# Patient Record
Sex: Female | Born: 1975 | Race: White | Hispanic: No | Marital: Married | Smoking: Current every day smoker
Health system: Southern US, Community
[De-identification: ages and names within clinical notes are randomized; demographics above are authoritative.]

## PROBLEM LIST (undated history)

## (undated) ENCOUNTER — Inpatient Hospital Stay (HOSPITAL_COMMUNITY): Payer: Self-pay

## (undated) DIAGNOSIS — N879 Dysplasia of cervix uteri, unspecified: Secondary | ICD-10-CM

## (undated) DIAGNOSIS — G4730 Sleep apnea, unspecified: Secondary | ICD-10-CM

## (undated) DIAGNOSIS — K219 Gastro-esophageal reflux disease without esophagitis: Secondary | ICD-10-CM

## (undated) DIAGNOSIS — E039 Hypothyroidism, unspecified: Secondary | ICD-10-CM

## (undated) DIAGNOSIS — K3580 Unspecified acute appendicitis: Secondary | ICD-10-CM

## (undated) DIAGNOSIS — M1990 Unspecified osteoarthritis, unspecified site: Secondary | ICD-10-CM

## (undated) DIAGNOSIS — E559 Vitamin D deficiency, unspecified: Secondary | ICD-10-CM

## (undated) DIAGNOSIS — G9050 Complex regional pain syndrome I, unspecified: Secondary | ICD-10-CM

## (undated) DIAGNOSIS — F419 Anxiety disorder, unspecified: Secondary | ICD-10-CM

## (undated) DIAGNOSIS — R600 Localized edema: Secondary | ICD-10-CM

## (undated) DIAGNOSIS — G90529 Complex regional pain syndrome I of unspecified lower limb: Secondary | ICD-10-CM

## (undated) DIAGNOSIS — R002 Palpitations: Secondary | ICD-10-CM

## (undated) DIAGNOSIS — J45909 Unspecified asthma, uncomplicated: Secondary | ICD-10-CM

## (undated) DIAGNOSIS — R519 Headache, unspecified: Secondary | ICD-10-CM

## (undated) DIAGNOSIS — M549 Dorsalgia, unspecified: Secondary | ICD-10-CM

## (undated) DIAGNOSIS — R1310 Dysphagia, unspecified: Secondary | ICD-10-CM

## (undated) DIAGNOSIS — M2550 Pain in unspecified joint: Secondary | ICD-10-CM

## (undated) DIAGNOSIS — R079 Chest pain, unspecified: Secondary | ICD-10-CM

## (undated) DIAGNOSIS — F329 Major depressive disorder, single episode, unspecified: Secondary | ICD-10-CM

## (undated) DIAGNOSIS — R51 Headache: Secondary | ICD-10-CM

## (undated) DIAGNOSIS — M797 Fibromyalgia: Secondary | ICD-10-CM

## (undated) DIAGNOSIS — F32A Depression, unspecified: Secondary | ICD-10-CM

## (undated) DIAGNOSIS — R0602 Shortness of breath: Secondary | ICD-10-CM

## (undated) DIAGNOSIS — K5900 Constipation, unspecified: Secondary | ICD-10-CM

## (undated) HISTORY — DX: Dysphagia, unspecified: R13.10

## (undated) HISTORY — DX: Palpitations: R00.2

## (undated) HISTORY — DX: Constipation, unspecified: K59.00

## (undated) HISTORY — DX: Pain in unspecified joint: M25.50

## (undated) HISTORY — DX: Hypothyroidism, unspecified: E03.9

## (undated) HISTORY — PX: OTHER SURGICAL HISTORY: SHX169

## (undated) HISTORY — DX: Chest pain, unspecified: R07.9

## (undated) HISTORY — PX: APPENDECTOMY: SHX54

## (undated) HISTORY — DX: Gastro-esophageal reflux disease without esophagitis: K21.9

## (undated) HISTORY — PX: KNEE SURGERY: SHX244

## (undated) HISTORY — DX: Localized edema: R60.0

## (undated) HISTORY — PX: TUBAL LIGATION: SHX77

## (undated) HISTORY — DX: Shortness of breath: R06.02

## (undated) HISTORY — PX: KNEE ARTHROSCOPY: SUR90

---
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
## 1995-01-21 HISTORY — PX: LAPAROSCOPIC GASTRIC BANDING: SHX1100

## 1997-09-04 ENCOUNTER — Emergency Department (HOSPITAL_COMMUNITY): Admission: EM | Admit: 1997-09-04 | Discharge: 1997-09-04 | Payer: Self-pay | Admitting: Emergency Medicine

## 1997-11-02 ENCOUNTER — Other Ambulatory Visit: Admission: RE | Admit: 1997-11-02 | Discharge: 1997-11-02 | Payer: Self-pay | Admitting: Obstetrics & Gynecology

## 2000-05-12 ENCOUNTER — Encounter: Payer: Self-pay | Admitting: Family Medicine

## 2000-05-12 ENCOUNTER — Encounter: Admission: RE | Admit: 2000-05-12 | Discharge: 2000-05-12 | Payer: Self-pay | Admitting: Family Medicine

## 2000-05-18 ENCOUNTER — Encounter: Payer: Self-pay | Admitting: Family Medicine

## 2000-05-18 ENCOUNTER — Ambulatory Visit (HOSPITAL_COMMUNITY): Admission: RE | Admit: 2000-05-18 | Discharge: 2000-05-18 | Payer: Self-pay | Admitting: Family Medicine

## 2000-10-23 ENCOUNTER — Encounter: Payer: Self-pay | Admitting: Family Medicine

## 2000-10-23 ENCOUNTER — Encounter: Admission: RE | Admit: 2000-10-23 | Discharge: 2000-10-23 | Payer: Self-pay | Admitting: Family Medicine

## 2001-06-15 ENCOUNTER — Other Ambulatory Visit: Admission: RE | Admit: 2001-06-15 | Discharge: 2001-06-15 | Payer: Self-pay | Admitting: Radiology

## 2002-02-03 ENCOUNTER — Other Ambulatory Visit: Admission: RE | Admit: 2002-02-03 | Discharge: 2002-02-03 | Payer: Self-pay | Admitting: *Deleted

## 2002-11-29 ENCOUNTER — Emergency Department (HOSPITAL_COMMUNITY): Admission: EM | Admit: 2002-11-29 | Discharge: 2002-11-29 | Payer: Self-pay | Admitting: Emergency Medicine

## 2003-04-18 ENCOUNTER — Encounter: Admission: RE | Admit: 2003-04-18 | Discharge: 2003-07-17 | Payer: Self-pay | Admitting: Sports Medicine

## 2003-11-02 ENCOUNTER — Ambulatory Visit (HOSPITAL_COMMUNITY): Admission: RE | Admit: 2003-11-02 | Discharge: 2003-11-02 | Payer: Self-pay | Admitting: Family Medicine

## 2004-07-01 ENCOUNTER — Ambulatory Visit: Payer: Self-pay | Admitting: Endocrinology

## 2004-08-20 ENCOUNTER — Ambulatory Visit: Payer: Self-pay | Admitting: Endocrinology

## 2004-10-11 ENCOUNTER — Ambulatory Visit: Payer: Self-pay | Admitting: Endocrinology

## 2005-01-10 ENCOUNTER — Emergency Department (HOSPITAL_COMMUNITY): Admission: EM | Admit: 2005-01-10 | Discharge: 2005-01-10 | Payer: Self-pay | Admitting: Emergency Medicine

## 2005-04-21 ENCOUNTER — Emergency Department (HOSPITAL_COMMUNITY): Admission: EM | Admit: 2005-04-21 | Discharge: 2005-04-22 | Payer: Self-pay | Admitting: Emergency Medicine

## 2005-09-18 ENCOUNTER — Encounter: Admission: RE | Admit: 2005-09-18 | Discharge: 2005-09-18 | Payer: Self-pay | Admitting: Gastroenterology

## 2005-09-24 ENCOUNTER — Encounter: Admission: RE | Admit: 2005-09-24 | Discharge: 2005-09-24 | Payer: Self-pay | Admitting: Gastroenterology

## 2005-10-22 ENCOUNTER — Ambulatory Visit (HOSPITAL_COMMUNITY): Admission: RE | Admit: 2005-10-22 | Discharge: 2005-10-22 | Payer: Self-pay | Admitting: Surgery

## 2005-10-22 ENCOUNTER — Encounter: Admission: RE | Admit: 2005-10-22 | Discharge: 2006-01-20 | Payer: Self-pay | Admitting: Sports Medicine

## 2005-11-05 ENCOUNTER — Ambulatory Visit (HOSPITAL_BASED_OUTPATIENT_CLINIC_OR_DEPARTMENT_OTHER): Admission: RE | Admit: 2005-11-05 | Discharge: 2005-11-05 | Payer: Self-pay | Admitting: Surgery

## 2005-11-09 ENCOUNTER — Ambulatory Visit: Payer: Self-pay | Admitting: Internal Medicine

## 2006-04-23 ENCOUNTER — Encounter: Admission: RE | Admit: 2006-04-23 | Discharge: 2006-07-22 | Payer: Self-pay | Admitting: Surgery

## 2006-05-18 ENCOUNTER — Ambulatory Visit (HOSPITAL_COMMUNITY): Admission: RE | Admit: 2006-05-18 | Discharge: 2006-05-19 | Payer: Self-pay | Admitting: Surgery

## 2006-09-08 ENCOUNTER — Encounter: Admission: RE | Admit: 2006-09-08 | Discharge: 2006-09-08 | Payer: Self-pay | Admitting: Surgery

## 2007-05-21 ENCOUNTER — Emergency Department (HOSPITAL_COMMUNITY): Admission: EM | Admit: 2007-05-21 | Discharge: 2007-05-21 | Payer: Self-pay | Admitting: Emergency Medicine

## 2008-03-06 ENCOUNTER — Other Ambulatory Visit: Admission: RE | Admit: 2008-03-06 | Discharge: 2008-03-06 | Payer: Self-pay | Admitting: Obstetrics & Gynecology

## 2008-12-09 ENCOUNTER — Emergency Department (HOSPITAL_COMMUNITY): Admission: EM | Admit: 2008-12-09 | Discharge: 2008-12-09 | Payer: Self-pay | Admitting: Emergency Medicine

## 2009-09-16 ENCOUNTER — Emergency Department (HOSPITAL_COMMUNITY): Admission: EM | Admit: 2009-09-16 | Discharge: 2009-09-17 | Payer: Self-pay | Admitting: Emergency Medicine

## 2009-09-17 ENCOUNTER — Inpatient Hospital Stay (HOSPITAL_COMMUNITY)
Admission: EM | Admit: 2009-09-17 | Discharge: 2009-09-23 | Payer: Self-pay | Source: Home / Self Care | Admitting: Psychiatry

## 2009-09-17 ENCOUNTER — Ambulatory Visit: Payer: Self-pay | Admitting: Psychiatry

## 2009-09-25 ENCOUNTER — Ambulatory Visit (HOSPITAL_COMMUNITY): Payer: Self-pay | Admitting: Psychiatry

## 2009-10-05 ENCOUNTER — Inpatient Hospital Stay (HOSPITAL_COMMUNITY): Admission: RE | Admit: 2009-10-05 | Discharge: 2009-10-09 | Payer: Self-pay | Admitting: Psychiatry

## 2009-10-16 ENCOUNTER — Ambulatory Visit (HOSPITAL_COMMUNITY): Payer: Self-pay | Admitting: Psychiatry

## 2009-10-17 ENCOUNTER — Encounter: Admission: RE | Admit: 2009-10-17 | Discharge: 2009-10-17 | Payer: Self-pay | Admitting: Sports Medicine

## 2009-10-24 ENCOUNTER — Ambulatory Visit (HOSPITAL_COMMUNITY): Payer: Self-pay | Admitting: Psychiatry

## 2009-10-26 ENCOUNTER — Ambulatory Visit (HOSPITAL_COMMUNITY): Payer: Self-pay | Admitting: Psychiatry

## 2009-11-02 ENCOUNTER — Ambulatory Visit (HOSPITAL_COMMUNITY): Payer: Self-pay | Admitting: Psychiatry

## 2009-11-09 ENCOUNTER — Ambulatory Visit (HOSPITAL_COMMUNITY): Payer: Self-pay | Admitting: Psychiatry

## 2009-11-20 ENCOUNTER — Ambulatory Visit (HOSPITAL_COMMUNITY): Payer: Self-pay | Admitting: Psychiatry

## 2009-11-23 ENCOUNTER — Ambulatory Visit (HOSPITAL_COMMUNITY): Payer: Self-pay | Admitting: Psychiatry

## 2009-12-07 ENCOUNTER — Ambulatory Visit (HOSPITAL_COMMUNITY): Payer: Self-pay | Admitting: Psychiatry

## 2009-12-18 ENCOUNTER — Ambulatory Visit (HOSPITAL_COMMUNITY): Payer: Self-pay | Admitting: Psychiatry

## 2010-01-01 ENCOUNTER — Ambulatory Visit (HOSPITAL_COMMUNITY): Payer: Self-pay | Admitting: Psychology

## 2010-01-07 ENCOUNTER — Emergency Department (HOSPITAL_COMMUNITY)
Admission: EM | Admit: 2010-01-07 | Discharge: 2010-01-08 | Payer: Self-pay | Source: Home / Self Care | Admitting: Emergency Medicine

## 2010-01-15 ENCOUNTER — Ambulatory Visit (HOSPITAL_COMMUNITY): Payer: Self-pay | Admitting: Psychiatry

## 2010-01-18 ENCOUNTER — Ambulatory Visit (HOSPITAL_COMMUNITY): Payer: Self-pay | Admitting: Psychiatry

## 2010-02-10 ENCOUNTER — Encounter: Payer: Self-pay | Admitting: Surgery

## 2010-02-10 ENCOUNTER — Encounter: Payer: Self-pay | Admitting: Gastroenterology

## 2010-02-14 ENCOUNTER — Ambulatory Visit (HOSPITAL_COMMUNITY): Admit: 2010-02-14 | Payer: Self-pay | Admitting: Psychiatry

## 2010-03-19 ENCOUNTER — Encounter (HOSPITAL_COMMUNITY): Payer: Self-pay | Admitting: Physician Assistant

## 2010-03-19 DIAGNOSIS — F309 Manic episode, unspecified: Secondary | ICD-10-CM

## 2010-03-26 ENCOUNTER — Encounter (HOSPITAL_COMMUNITY): Payer: Self-pay | Admitting: Psychology

## 2010-04-01 LAB — POCT I-STAT, CHEM 8
BUN: 9 mg/dL (ref 6–23)
Calcium, Ion: 1.13 mmol/L (ref 1.12–1.32)
Chloride: 106 mEq/L (ref 96–112)
Creatinine, Ser: 0.8 mg/dL (ref 0.4–1.2)
Glucose, Bld: 110 mg/dL — ABNORMAL HIGH (ref 70–99)
HCT: 44 % (ref 36.0–46.0)
Hemoglobin: 15 g/dL (ref 12.0–15.0)
Potassium: 3.7 mEq/L (ref 3.5–5.1)
Sodium: 137 mEq/L (ref 135–145)
TCO2: 22 mmol/L (ref 0–100)

## 2010-04-01 LAB — DIFFERENTIAL
Basophils Absolute: 0.1 10*3/uL (ref 0.0–0.1)
Basophils Relative: 1 % (ref 0–1)
Eosinophils Absolute: 0.1 10*3/uL (ref 0.0–0.7)
Eosinophils Relative: 1 % (ref 0–5)
Lymphocytes Relative: 28 % (ref 12–46)
Lymphs Abs: 2.6 10*3/uL (ref 0.7–4.0)
Monocytes Absolute: 0.9 10*3/uL (ref 0.1–1.0)
Monocytes Relative: 9 % (ref 3–12)
Neutro Abs: 5.9 10*3/uL (ref 1.7–7.7)
Neutrophils Relative %: 62 % (ref 43–77)

## 2010-04-01 LAB — CBC
HCT: 42.2 % (ref 36.0–46.0)
Hemoglobin: 14.4 g/dL (ref 12.0–15.0)
MCH: 30.8 pg (ref 26.0–34.0)
MCHC: 34.1 g/dL (ref 30.0–36.0)
MCV: 90.4 fL (ref 78.0–100.0)
Platelets: 216 10*3/uL (ref 150–400)
RBC: 4.67 MIL/uL (ref 3.87–5.11)
RDW: 13.3 % (ref 11.5–15.5)
WBC: 9.5 10*3/uL (ref 4.0–10.5)

## 2010-04-01 LAB — RAPID STREP SCREEN (MED CTR MEBANE ONLY): Streptococcus, Group A Screen (Direct): NEGATIVE

## 2010-04-01 LAB — MONONUCLEOSIS SCREEN: Mono Screen: NEGATIVE

## 2010-04-03 ENCOUNTER — Encounter (HOSPITAL_COMMUNITY): Payer: Self-pay | Admitting: Physician Assistant

## 2010-04-04 LAB — URINALYSIS, ROUTINE W REFLEX MICROSCOPIC
Bilirubin Urine: NEGATIVE
Glucose, UA: NEGATIVE mg/dL
Hgb urine dipstick: NEGATIVE
Ketones, ur: NEGATIVE mg/dL
Nitrite: NEGATIVE
Protein, ur: NEGATIVE mg/dL
Specific Gravity, Urine: 1.019 (ref 1.005–1.030)
Urobilinogen, UA: 0.2 mg/dL (ref 0.0–1.0)
pH: 5 (ref 5.0–8.0)

## 2010-04-04 LAB — DRUGS OF ABUSE SCREEN W/O ALC, ROUTINE URINE
Amphetamine Screen, Ur: NEGATIVE
Barbiturate Quant, Ur: NEGATIVE
Benzodiazepines.: NEGATIVE
Cocaine Metabolites: NEGATIVE
Creatinine,U: 134.5 mg/dL
Marijuana Metabolite: NEGATIVE
Methadone: NEGATIVE
Opiate Screen, Urine: NEGATIVE
Phencyclidine (PCP): NEGATIVE
Propoxyphene: NEGATIVE

## 2010-04-04 LAB — PREGNANCY, URINE: Preg Test, Ur: NEGATIVE

## 2010-04-04 LAB — TSH: TSH: 0.013 u[IU]/mL — ABNORMAL LOW (ref 0.350–4.500)

## 2010-04-05 LAB — DIFFERENTIAL
Eosinophils Relative: 2 % (ref 0–5)
Lymphocytes Relative: 34 % (ref 12–46)
Lymphs Abs: 2.2 10*3/uL (ref 0.7–4.0)
Monocytes Absolute: 0.3 10*3/uL (ref 0.1–1.0)
Monocytes Relative: 5 % (ref 3–12)

## 2010-04-05 LAB — RAPID URINE DRUG SCREEN, HOSP PERFORMED
Barbiturates: NOT DETECTED
Benzodiazepines: NOT DETECTED
Cocaine: NOT DETECTED

## 2010-04-05 LAB — CBC
HCT: 40.5 % (ref 36.0–46.0)
Hemoglobin: 14.1 g/dL (ref 12.0–15.0)
MCV: 91.5 fL (ref 78.0–100.0)
RDW: 12.5 % (ref 11.5–15.5)
WBC: 6.6 10*3/uL (ref 4.0–10.5)

## 2010-04-05 LAB — COMPREHENSIVE METABOLIC PANEL
AST: 17 U/L (ref 0–37)
BUN: 11 mg/dL (ref 6–23)
CO2: 27 mEq/L (ref 19–32)
Glucose, Bld: 111 mg/dL — ABNORMAL HIGH (ref 70–99)
Potassium: 4.1 mEq/L (ref 3.5–5.1)
Sodium: 141 mEq/L (ref 135–145)

## 2010-04-05 LAB — ETHANOL: Alcohol, Ethyl (B): <5 mg/dL (ref 0–10)

## 2010-04-17 ENCOUNTER — Encounter (HOSPITAL_COMMUNITY): Payer: Self-pay | Admitting: Physician Assistant

## 2010-04-24 LAB — COMPREHENSIVE METABOLIC PANEL
AST: 14 U/L (ref 0–37)
Alkaline Phosphatase: 66 U/L (ref 39–117)
CO2: 23 mEq/L (ref 19–32)
Chloride: 109 mEq/L (ref 96–112)
Creatinine, Ser: 0.78 mg/dL (ref 0.4–1.2)
GFR calc Af Amer: >60 mL/min (ref >60)
GFR calc non Af Amer: >60 mL/min (ref >60)
Potassium: 4.2 mEq/L (ref 3.5–5.1)
Total Bilirubin: 0.6 mg/dL (ref 0.3–1.2)

## 2010-04-24 LAB — CBC
HCT: 42.4 % (ref 36.0–46.0)
MCV: 95.3 fL (ref 78.0–100.0)
RBC: 4.45 MIL/uL (ref 3.87–5.11)
WBC: 8.9 10*3/uL (ref 4.0–10.5)

## 2010-04-24 LAB — DIFFERENTIAL
Basophils Absolute: 0 10*3/uL (ref 0.0–0.1)
Basophils Relative: 0 % (ref 0–1)
Eosinophils Absolute: 0.2 10*3/uL (ref 0.0–0.7)
Eosinophils Relative: 2 % (ref 0–5)
Lymphocytes Relative: 25 % (ref 12–46)

## 2010-04-24 LAB — URINALYSIS, ROUTINE W REFLEX MICROSCOPIC
Glucose, UA: NEGATIVE mg/dL
Hgb urine dipstick: NEGATIVE
Specific Gravity, Urine: 1.007 (ref 1.005–1.030)
Urobilinogen, UA: 0.2 mg/dL (ref 0.0–1.0)

## 2010-04-24 LAB — LIPASE, BLOOD: Lipase: 15 U/L (ref 11–59)

## 2010-06-07 NOTE — Op Note (Signed)
Sally [name], Sally [name]               ACCOUNT NO.:  [id number]   MEDICAL RECORD NO.:  [id number]          PATIENT TYPE:  AMB   LOCATION:  DAY                          FACILITY:  [facility]   PHYSICIAN:  Thornton Park. Daphine Deutscher, MD  DATE OF BIRTH:  [birth year]   DATE OF PROCEDURE:  05/18/2006  DATE OF DISCHARGE:                               OPERATIVE REPORT   PREOPERATIVE DIAGNOSES:  1. Morbid obesity BMI 46.5.  2. Questionable hiatal hernia.   POSTOPERATIVE DIAGNOSES:  Morbid obesity with no significant hiatal  hernia appreciated.   PROCEDURE:  Laparoscopic band (Allergan lap band APS) with antiprolapse  suture along the lesser curvature.   SURGEON:  Dr. Daphine Deutscher   ASSISTANT:  Dr. Ezzard Standing   ANESTHESIA:  General endotracheal.   DESCRIPTION OF PROCEDURE:  The patient was taken to room 1, given  general anesthesia.  The abdomen was prepped with Techni-Care and draped  sterilely.  The abdomen was entered through the left upper quadrant with  an OptiVu 0-degree without difficulty, and the abdomen was insufflated.  Usual other trocars were placed including the 12, eventually two 12s on  the right side, 11 to the left of the umbilicus for the scope.  Using an  angled scope, began the dissection up where the cardia was, and I  noticed that __________ dissect the fat pad down because it looked like  it might be herniating.  I freed all this and then went over on the  right side and fatty stripe on the right, created a window there and  then passed the band passer, but it always seemed like it wanted to not  come exactly through where we wanted it.  I then went back and dissected  more with the harmonic scalpel and opened up the window more from the  patient's left side and then the band passer which was at this time  passed from the right inferior port, came through easily.  The APS  system band was made ready and brought into the abdomen, the 12 in the  right upper side and once it was introduced,  it was threaded into the  band passer and brought around the stomach.  It was then snapped in  place.  Prior to doing this, I had passed the calibration tubing and had  actually blown it up with a balloon to 20 mL and pulled it back, and it  stopped at the GE junction and the hiatus.  I did not feel there was  significant hiatal hernia, at least on that test.  That sort of  corroborated the visual findings.  That being said, we went ahead and  put the band on and backed the tubing out at that point, and then  plicated it with 3 sutures using tie-knots and then a fourth suture  along the lesser curvature tied intracorporeally was an anti-prolapse  suture.  There was essentially no bleeding, and everything looked to be  in order.  The end of the tubing was then brought out through the lower  right port and a pocket was created in the Bovie,  and this was then  connected to the tubing and then sewn in place with 4  sutures of 2-0 Prolene.  Wounds were then irrigated, closed with 4-0  Vicryl, and with staples and with Dermabond.  The patient seemed to The  patient seemed to tolerate the procedure well and was taken to the  recovery room in satisfactory condition.      Thornton Park Daphine Deutscher, MD  Electronically Signed     MBM/MEDQ  D:  05/18/2006  T:  05/18/2006  Job:  [id number]   cc:   Tammy R. Collins [place], M.D.  Fax: [phone]   Graylin Shiver, M.D.  Fax: [phone]

## 2010-06-07 NOTE — Procedures (Signed)
Sally [name], Sally [name]               ACCOUNT NO.:  [id number]   MEDICAL RECORD NO.:  [id number]          PATIENT TYPE:  OUT   LOCATION:  SLEEP CENTER                 FACILITY:  [facility]   PHYSICIAN:  Clinton D. Maple Hudson, MD, FCCP, FACPDATE OF BIRTH:  August 18[birth year] 1974   DATE OF STUDY:  11/05/2005                              NOCTURNAL POLYSOMNOGRAM   REFERRING PHYSICIAN:  Thornton Park. Daphine Deutscher, M.D.   INDICATIONS FOR STUDY:  Hypersomnia with sleep apnea.  Epworth sleepiness  score 10/24.  BMI 46.4.  Weight 300 pounds.   HOME MEDICATIONS:  Synthroid, Lexapro, Wellbutrin XL, antihistamines,  heartburn medicines as needed, Xanax as needed.   SLEEP ARCHITECTURE:  Total sleep time 341 minutes with a sleep efficiency of  82%. Stage I was 8%, stage II 76%, stages III and IV 17%. REM absent. Sleep  latency 13 minutes. Awake after sleep onset 65 minutes. Arousal index 39.2.  She took Xanax at 10:00 p.m.   RESPIRATORY DATA:  Apnea/hypopnea index (AHI, RDI): 19.7 obstructive events  per hour indicating moderate obstructive sleep apnea/hypopnea syndrome.  There were 35 obstructive apneas, and 77 hypopneas. Events were not  positional.  CPAP titration was attempted beginning at 5 CWP.  The  technician tried repeatedly and made adjustments, but the patient could not  get comfortable to begin the CPAP titration process complaining of  suffocation and asking that it be discontinued at 1:16 a.m.  The study was  then continued as a diagnostic NPSG.   OXYGEN DATA:  Loud snoring with oxygen desaturation to a nadir of 60%. Mean  oxygen saturation through the study was 91% on room air.   CARDIAC DATA:  Sinus rhythm with occasional PACs and PVCs.   MOVEMENT/PARASOMNIA:  Occasional limb jerk with arousal.  Bathroom x2.   IMPRESSION/RECOMMENDATIONS:  1. Moderately severe obstructive sleep apnea/hypopnea syndrome, AHI 19.7      per hour with non-positional events, loud snoring and oxygen      desaturation to 60%.  2.  She was not able to tolerate CPAP on initial presentation on this study      night.  She indicates a willingness to try again.  Consider return for      CPAP titration when there is more opportunity to work with her on mask      and pressure adjustment for comfort.  It may help to bring a sleep      medication or to take Xanax again if she returns.      Otherwise, consider alternative interventions.  In the long term,      weight loss is strongly recommended for management of this condition.      Clinton D. Maple Hudson, MD, [facility], FACP  Diplomate, Biomedical engineer of Sleep Medicine  Electronically Signed     CDY/MEDQ  D:  11/09/2005 12:07:05  T:  11/10/2005 00:05:54  Job:  [id number]

## 2010-07-26 ENCOUNTER — Ambulatory Visit (INDEPENDENT_AMBULATORY_CARE_PROVIDER_SITE_OTHER): Payer: BC Managed Care – PPO | Admitting: Physician Assistant

## 2010-07-26 ENCOUNTER — Encounter (INDEPENDENT_AMBULATORY_CARE_PROVIDER_SITE_OTHER): Payer: Self-pay

## 2010-07-26 VITALS — BP 132/86 | Ht 68.0 in | Wt 238.4 lb

## 2010-07-26 DIAGNOSIS — Z4651 Encounter for fitting and adjustment of gastric lap band: Secondary | ICD-10-CM

## 2010-07-26 NOTE — Progress Notes (Signed)
  HISTORY: Sally [name] is a 34 y.o.female who received an AP-Standard lap-band in April 2008 by Dr. Daphine Deutscher. The patient has been doing well in the past year. She denies any persistent vomiting or regurgitation symptoms. She says her hunger has returned and her portion sizes although small are not as small as they once were. She believes an adjustment would be helpful today.  VITAL SIGNS: Filed Vitals:   07/26/10 1528  BP: 132/86    PHYSICAL EXAM: Physical exam reveals a very well-appearing 35 y.o.female in no apparent distress Neurologic: Awake, alert, oriented Psych: Bright affect, conversant Respiratory: Breathing even and unlabored. No stridor or wheezing Abdomen: Soft, nontender, nondistended to palpation. Incisions well-healed. No incisional hernias. Port easily palpated. Extremities: Atraumatic, good range of motion.  ASSESMENT: 35 y.o.  female  s/p AP-Standard lap-band. She could benefit from an adjustment today.  PLAN: The patient's port was accessed with a 20G Huber needle without difficulty. Clear fluid was aspirated and 0.25 mL saline was added to the port to give a total volume of 6.5 mL. The patient was able to swallow water without difficulty following the procedure and was instructed to take clear liquids for the next 24-48 hours and advance slowly as tolerated.

## 2010-07-26 NOTE — Patient Instructions (Signed)
Take clear liquids for the next 48 hours. Thin protein shakes are ok to start on Saturday evening. Call us if you have persistent vomiting or regurgitation, night cough or reflux symptoms. Return as scheduled or sooner if you notice no changes in hunger/portion sizes.   

## 2010-08-12 ENCOUNTER — Encounter (HOSPITAL_COMMUNITY): Payer: Self-pay

## 2010-08-12 ENCOUNTER — Encounter (HOSPITAL_COMMUNITY)
Admission: RE | Admit: 2010-08-12 | Discharge: 2010-08-12 | Disposition: A | Discharge status: Home / Self Care | Payer: BC Managed Care – PPO | Source: Ambulatory Visit | Attending: Obstetrics & Gynecology | Admitting: Obstetrics & Gynecology

## 2010-08-12 HISTORY — DX: Sleep apnea, unspecified: G47.30

## 2010-08-12 HISTORY — DX: Anxiety disorder, unspecified: F41.9

## 2010-08-12 HISTORY — DX: Fibromyalgia: M79.7

## 2010-08-12 LAB — CBC
HCT: 43.2 % (ref 36.0–46.0)
MCH: 31.5 pg (ref 26.0–34.0)
MCHC: 33.8 g/dL (ref 30.0–36.0)
MCV: 93.3 fL (ref 78.0–100.0)
Platelets: 236 10*3/uL (ref 150–400)
RDW: 12.9 % (ref 11.5–15.5)
WBC: 8.2 10*3/uL (ref 4.0–10.5)

## 2010-08-12 LAB — SURGICAL PCR SCREEN
MRSA, PCR: NEGATIVE
Staphylococcus aureus: NEGATIVE

## 2010-08-12 NOTE — Patient Instructions (Signed)
20 Marlisa Caridi  08/12/2010   Your procedure is scheduled on:  08/19/10  Report to [facility] at 0930 AM. Main entrance- desk phone- x [phone]  Call this number if you have problems the morning of surgery: [phone]   Remember:   Do not eat food:After Midnight.  Do not drink clear liquids: 4 Hours before arrival.7am   Take these medicines the morning of surgery with A SIP OF WATER: none   Do not wear jewelry, make-up or nail polish.  Do not bring valuables to the hospital.  Contacts, dentures or bridgework may not be worn into surgery.  Leave suitcase in the car. After surgery it may be brought to your room.  For patients admitted to the hospital, checkout time is 11:00 AM the day of discharge.   Patients discharged the day of surgery will not be allowed to drive home.  Name and phone number of your driver: Emeline Gins- [phone]  Special Instructions:  Please read over the following fact sheets that you were given:

## 2010-08-18 MED ORDER — CEFAZOLIN SODIUM-DEXTROSE 2-3 GM-% IV SOLR
2.0000 g | INTRAVENOUS | Status: AC
  Administered 2010-08-19: 2 g via INTRAVENOUS
  Filled 2010-08-18: qty 50

## 2010-08-19 ENCOUNTER — Encounter (HOSPITAL_COMMUNITY): Payer: Self-pay | Admitting: Obstetrics & Gynecology

## 2010-08-19 ENCOUNTER — Ambulatory Visit (HOSPITAL_COMMUNITY)
Admission: RE | Admit: 2010-08-19 | Discharge: 2010-08-19 | Disposition: A | Discharge status: Home / Self Care | Payer: BC Managed Care – PPO | Source: Ambulatory Visit | Attending: Obstetrics & Gynecology | Admitting: Obstetrics & Gynecology

## 2010-08-19 ENCOUNTER — Encounter (HOSPITAL_COMMUNITY): Payer: Self-pay | Admitting: Anesthesiology

## 2010-08-19 ENCOUNTER — Other Ambulatory Visit: Payer: Self-pay | Admitting: Obstetrics & Gynecology

## 2010-08-19 ENCOUNTER — Ambulatory Visit (HOSPITAL_COMMUNITY): Payer: BC Managed Care – PPO | Admitting: Anesthesiology

## 2010-08-19 ENCOUNTER — Encounter (HOSPITAL_COMMUNITY)
Admission: RE | Disposition: A | Discharge status: Home / Self Care | Payer: Self-pay | Source: Ambulatory Visit | Attending: Obstetrics & Gynecology

## 2010-08-19 ENCOUNTER — Encounter (HOSPITAL_COMMUNITY): Payer: Self-pay

## 2010-08-19 DIAGNOSIS — E039 Hypothyroidism, unspecified: Secondary | ICD-10-CM | POA: Insufficient documentation

## 2010-08-19 DIAGNOSIS — Z01812 Encounter for preprocedural laboratory examination: Secondary | ICD-10-CM | POA: Insufficient documentation

## 2010-08-19 DIAGNOSIS — Z01818 Encounter for other preprocedural examination: Secondary | ICD-10-CM | POA: Insufficient documentation

## 2010-08-19 DIAGNOSIS — N83201 Unspecified ovarian cyst, right side: Secondary | ICD-10-CM | POA: Diagnosis present

## 2010-08-19 DIAGNOSIS — IMO0001 Reserved for inherently not codable concepts without codable children: Secondary | ICD-10-CM | POA: Insufficient documentation

## 2010-08-19 DIAGNOSIS — N83209 Unspecified ovarian cyst, unspecified side: Secondary | ICD-10-CM | POA: Insufficient documentation

## 2010-08-19 DIAGNOSIS — N949 Unspecified condition associated with female genital organs and menstrual cycle: Secondary | ICD-10-CM | POA: Insufficient documentation

## 2010-08-19 DIAGNOSIS — R102 Pelvic and perineal pain: Secondary | ICD-10-CM | POA: Diagnosis present

## 2010-08-19 HISTORY — PX: LAPAROSCOPY: SHX197

## 2010-08-19 LAB — PREGNANCY, URINE: Preg Test, Ur: NEGATIVE

## 2010-08-19 SURGERY — LAPAROSCOPY OPERATIVE
Anesthesia: General | Laterality: Right | Wound class: Clean

## 2010-08-19 MED ORDER — HYDROMORPHONE HCL 1 MG/ML IJ SOLN
0.2500 mg | INTRAMUSCULAR | Status: DC | PRN
  Administered 2010-08-19: 0.25 mg via INTRAVENOUS
  Administered 2010-08-19 (×2): 0.5 mg via INTRAVENOUS
  Administered 2010-08-19: 0.25 mg via INTRAVENOUS

## 2010-08-19 MED ORDER — PROPOFOL 10 MG/ML IV EMUL
INTRAVENOUS | Status: DC | PRN
  Administered 2010-08-19: 200 mg via INTRAVENOUS

## 2010-08-19 MED ORDER — NEOSTIGMINE METHYLSULFATE 1 MG/ML IJ SOLN
INTRAMUSCULAR | Status: AC
  Filled 2010-08-19: qty 10

## 2010-08-19 MED ORDER — FAMOTIDINE 20 MG PO TABS: 20.0000 mg | ORAL_TABLET | Freq: Once | ORAL | Status: DC | PRN

## 2010-08-19 MED ORDER — GLYCOPYRROLATE 0.2 MG/ML IJ SOLN
INTRAMUSCULAR | Status: AC
  Filled 2010-08-19: qty 1

## 2010-08-19 MED ORDER — LIDOCAINE HCL (CARDIAC) 20 MG/ML IV SOLN
INTRAVENOUS | Status: DC | PRN
  Administered 2010-08-19: 50 mg via INTRAVENOUS

## 2010-08-19 MED ORDER — FENTANYL CITRATE 0.05 MG/ML IJ SOLN
INTRAMUSCULAR | Status: DC | PRN
  Administered 2010-08-19: 150 ug via INTRAVENOUS
  Administered 2010-08-19: 50 ug via INTRAVENOUS

## 2010-08-19 MED ORDER — ACETAMINOPHEN 10 MG/ML IV SOLN: 1000.0000 mg | Freq: Once | INTRAVENOUS | Status: DC | PRN

## 2010-08-19 MED ORDER — FENTANYL CITRATE 0.05 MG/ML IJ SOLN
INTRAMUSCULAR | Status: AC
  Filled 2010-08-19: qty 5

## 2010-08-19 MED ORDER — PANTOPRAZOLE SODIUM 40 MG PO TBEC: 40.0000 mg | DELAYED_RELEASE_TABLET | Freq: Once | ORAL | Status: DC | PRN

## 2010-08-19 MED ORDER — DEXAMETHASONE SODIUM PHOSPHATE 10 MG/ML IJ SOLN
INTRAMUSCULAR | Status: AC
  Filled 2010-08-19: qty 1

## 2010-08-19 MED ORDER — DEXAMETHASONE SODIUM PHOSPHATE 4 MG/ML IJ SOLN
INTRAMUSCULAR | Status: DC | PRN
  Administered 2010-08-19: 10 mg via INTRAVENOUS

## 2010-08-19 MED ORDER — ROCURONIUM BROMIDE 50 MG/5ML IV SOLN
INTRAVENOUS | Status: AC
  Filled 2010-08-19: qty 1

## 2010-08-19 MED ORDER — LIDOCAINE HCL (CARDIAC) 20 MG/ML IV SOLN
INTRAVENOUS | Status: AC
  Filled 2010-08-19: qty 5

## 2010-08-19 MED ORDER — MIDAZOLAM HCL 5 MG/5ML IJ SOLN
INTRAMUSCULAR | Status: DC | PRN
  Administered 2010-08-19: 2 mg via INTRAVENOUS

## 2010-08-19 MED ORDER — PROPOFOL 10 MG/ML IV EMUL
INTRAVENOUS | Status: AC
  Filled 2010-08-19: qty 20

## 2010-08-19 MED ORDER — ATROPINE SULFATE 0.4 MG/ML IJ SOLN
INTRAMUSCULAR | Status: AC
  Filled 2010-08-19: qty 1

## 2010-08-19 MED ORDER — BUPIVACAINE HCL (PF) 0.25 % IJ SOLN
INTRAMUSCULAR | Status: DC | PRN
  Administered 2010-08-19: 14 mL
  Administered 2010-08-19: 8 mL

## 2010-08-19 MED ORDER — MIDAZOLAM HCL 2 MG/2ML IJ SOLN
INTRAMUSCULAR | Status: AC
  Filled 2010-08-19: qty 2

## 2010-08-19 MED ORDER — LACTATED RINGERS IR SOLN
Status: DC | PRN
  Administered 2010-08-19: 3000 mL

## 2010-08-19 MED ORDER — KETOROLAC TROMETHAMINE 60 MG/2ML IM SOLN
INTRAMUSCULAR | Status: DC | PRN
  Administered 2010-08-19 (×2): 30 mg via INTRAMUSCULAR

## 2010-08-19 MED ORDER — FENTANYL CITRATE 0.05 MG/ML IJ SOLN
INTRAMUSCULAR | Status: DC | PRN
  Administered 2010-08-19 (×3): 100 ug via INTRAVENOUS
  Administered 2010-08-19: 50 ug via INTRAVENOUS

## 2010-08-19 MED ORDER — KETOROLAC TROMETHAMINE 60 MG/2ML IM SOLN
INTRAMUSCULAR | Status: AC
  Filled 2010-08-19: qty 2

## 2010-08-19 MED ORDER — CITRIC ACID-SODIUM CITRATE 334-500 MG/5ML PO SOLN: 30.0000 mL | Freq: Once | ORAL | Status: DC | PRN

## 2010-08-19 MED ORDER — HYDROMORPHONE HCL 1 MG/ML IJ SOLN
INTRAMUSCULAR | Status: AC
  Filled 2010-08-19: qty 1

## 2010-08-19 MED ORDER — FENTANYL CITRATE 0.05 MG/ML IJ SOLN
INTRAMUSCULAR | Status: AC
  Filled 2010-08-19: qty 2

## 2010-08-19 MED ORDER — ATROPINE SULFATE 0.4 MG/ML IJ SOLN
INTRAMUSCULAR | Status: DC | PRN
  Administered 2010-08-19: 0.4 mg via INTRAVENOUS
  Administered 2010-08-19: .2 mg via INTRAVENOUS
  Administered 2010-08-19: .3 mg via INTRAVENOUS

## 2010-08-19 MED ORDER — HYDROCODONE-ACETAMINOPHEN 5-500 MG PO TABS: 1.0000 | ORAL_TABLET | Freq: Four times a day (QID) | ORAL | Status: DC | PRN

## 2010-08-19 MED ORDER — MIDAZOLAM HCL 2 MG/2ML IJ SOLN: 0.5000 mg | INTRAMUSCULAR | Status: DC | PRN

## 2010-08-19 MED ORDER — ONDANSETRON HCL 4 MG/2ML IJ SOLN
INTRAMUSCULAR | Status: AC
  Filled 2010-08-19: qty 2

## 2010-08-19 MED ORDER — ROCURONIUM BROMIDE 100 MG/10ML IV SOLN
INTRAVENOUS | Status: DC | PRN
  Administered 2010-08-19: 40 mg via INTRAVENOUS

## 2010-08-19 MED ORDER — MEPERIDINE HCL 25 MG/ML IJ SOLN: 6.2500 mg | INTRAMUSCULAR | Status: DC | PRN

## 2010-08-19 MED ORDER — LACTATED RINGERS IV SOLN
INTRAVENOUS | Status: DC
  Administered 2010-08-19 (×2): via INTRAVENOUS

## 2010-08-19 MED ORDER — ACETAMINOPHEN 325 MG PO TABS
325.0000 mg | ORAL_TABLET | ORAL | Status: DC | PRN
Start: 2010-08-19 — End: 2010-08-19

## 2010-08-19 MED ORDER — GLYCOPYRROLATE 0.2 MG/ML IJ SOLN
INTRAMUSCULAR | Status: DC | PRN
  Administered 2010-08-19: 3 mg via INTRAVENOUS

## 2010-08-19 MED ORDER — PROMETHAZINE HCL 25 MG/ML IJ SOLN: 6.2500 mg | INTRAMUSCULAR | Status: DC | PRN

## 2010-08-19 SURGICAL SUPPLY — 35 items
APL SKNCLS STERI-STRIP NONHPOA (GAUZE/BANDAGES/DRESSINGS)
BAG SPEC RTRVL LRG 6X4 10 (ENDOMECHANICALS)
BARRIER ADHS 3X4 INTERCEED (GAUZE/BANDAGES/DRESSINGS) ×1 IMPLANT
BENZOIN TINCTURE PRP APPL 2/3 (GAUZE/BANDAGES/DRESSINGS) IMPLANT
BRR ADH 4X3 ABS CNTRL BYND (GAUZE/BANDAGES/DRESSINGS) ×2
CABLE HIGH FREQUENCY MONO STRZ (ELECTRODE) ×1 IMPLANT
CATH ROBINSON RED A/P 16FR (CATHETERS) ×1 IMPLANT
CHLORAPREP W/TINT 26ML (MISCELLANEOUS) ×3 IMPLANT
CLOTH BEACON ORANGE TIMEOUT ST (SAFETY) ×3 IMPLANT
DERMABOND ADVANCED (GAUZE/BANDAGES/DRESSINGS) ×1 IMPLANT
GLOVE BIOGEL PI IND STRL 7.0 (GLOVE) ×6 IMPLANT
GLOVE BIOGEL PI INDICATOR 7.0 (GLOVE) ×3
GLOVE ECLIPSE 6.0 STRL STRAW (GLOVE) ×1 IMPLANT
GLOVE ECLIPSE 6.5 STRL STRAW (GLOVE) ×6 IMPLANT
GLOVE INDICATOR 6.5 STRL GRN (GLOVE) ×1 IMPLANT
GOWN PREVENTION PLUS LG XLONG (DISPOSABLE) ×6 IMPLANT
NDL INSUFFLATION 14GA 120MM (NEEDLE) ×2 IMPLANT
NEEDLE INSUFFLATION 14GA 120MM (NEEDLE) ×3 IMPLANT
PACK LAPAROSCOPY BASIN (CUSTOM PROCEDURE TRAY) ×3 IMPLANT
POUCH SPECIMEN RETRIEVAL 10MM (ENDOMECHANICALS) IMPLANT
SCISSORS LAP 5X35 DISP (ENDOMECHANICALS) ×1 IMPLANT
SEALER TISSUE G2 CVD JAW 35 (ENDOMECHANICALS) IMPLANT
SEALER TISSUE G2 CVD JAW 45CM (ENDOMECHANICALS)
SET IRRIG TUBING LAPAROSCOPIC (IRRIGATION / IRRIGATOR) ×1 IMPLANT
STRIP CLOSURE SKIN 1/2X4 (GAUZE/BANDAGES/DRESSINGS) IMPLANT
STRIP CLOSURE SKIN 1/4X4 (GAUZE/BANDAGES/DRESSINGS) ×3 IMPLANT
SUT VICRYL 0 UR6 27IN ABS (SUTURE) IMPLANT
SUT VICRYL 4-0 PS2 18IN ABS (SUTURE) ×3 IMPLANT
SYR 30ML LL (SYRINGE) IMPLANT
TOWEL OR 17X24 6PK STRL BLUE (TOWEL DISPOSABLE) ×6 IMPLANT
TRAY FOLEY CATH 14FR (SET/KITS/TRAYS/PACK) ×3 IMPLANT
TROCAR BALLN 12MMX100 BLUNT (TROCAR) IMPLANT
TROCAR XCEL NON-BLD 11X100MML (ENDOMECHANICALS) ×1 IMPLANT
TROCAR XCEL NON-BLD 5MMX100MML (ENDOMECHANICALS) ×2 IMPLANT
WATER STERILE IRR 1000ML POUR (IV SOLUTION) ×3 IMPLANT

## 2010-08-19 NOTE — H&P (Signed)
Sally [name] is an 35 y.o. female with persistent 3cm adnexal/ovarian cyst and chronic pelvic pain.  We have followed this cyst on ultrasound for over a year and now, due to patient anxiety, have decided to proceed with removal.  Risks and benefits have been discussed with patient.  This is all documented in her office chart.  While treatment for the ovarian cyst, I will also evaluate for endometriosis and, if present, will plan to treat at the same time.  Pertinent Gynecological History: Menses: flow is light and irregular due to IUD Bleeding: minimal Contraception: IUD DES exposure: denies Blood transfusions: none Sexually transmitted diseases: no past history Previous GYN Procedures: none except ultrasounds  Last mammogram: not appropriate yet due to age Date: N/A Last pap: normal Date: May 2012 OB History: G1, P1   Menstrual History: Menarche age: 12 No LMP recorded. Patient is not currently having periods (Reason: IUD).    Past Medical History  Diagnosis Date  . Hypothyroidism   . Sleep apnea     pre lap banding  . Anxiety   . Fibromyalgia     Past Surgical History  Procedure Date  . Cesarean section   . Laparoscopic gastric banding 1997  . Knee surgery   . Brain surgery 07/1989    blood clot removed    No family history on file.  Social History:  reports that she has been smoking.  She does not have any smokeless tobacco history on file. She reports that she drinks alcohol. She reports that she does not use illicit drugs.  Allergies:  Allergies  Allergen Reactions  . Paxil Rash  . Percocet (Oxycodone-Acetaminophen) Itching  . Codeine Other (See Comments)    Puts patient to sleep "knocks her out" for at least two days, regardless of dosage.    Prescriptions prior to admission  Medication Sig Dispense Refill  . acetaminophen (TYLENOL) 500 MG tablet Take 500 mg by mouth every 6 (six) hours as needed. For pain       . acetaminophen (TYLENOL) 650 MG CR  tablet Take 650 mg by mouth every 8 (eight) hours as needed. For joint pain       . clonazePAM (KLONOPIN) 1 MG tablet Take 0.5 mg by mouth 2 (two) times daily as needed. For anxiety       . FLUoxetine (PROZAC) 40 MG capsule Take 40 mg by mouth daily.        Marland Kitchen levothyroxine (SYNTHROID, LEVOTHROID) 150 MCG tablet Take 150 mcg by mouth daily.        . Loratadine (CLARITIN) 10 MG CAPS Take 10 mg by mouth. Patient takes generic version.         Review of Systems  Constitutional: Negative for fever and chills.  Eyes: Negative for blurred vision.  Respiratory: Negative for cough and shortness of breath.   Cardiovascular: Negative for chest pain and palpitations.  Gastrointestinal: Negative for heartburn and abdominal pain.  Genitourinary: Negative for dysuria and urgency.  Neurological: Negative for dizziness, loss of consciousness and headaches.  Endo/Heme/Allergies: Does not bruise/bleed easily.  Psychiatric/Behavioral: Negative for depression.    Blood pressure 154/89, pulse 62, temperature 98.1 F (36.7 C), temperature source Oral, resp. rate 20, height 5' 7.5" (1.715 m), weight 107.956 kg (238 lb), SpO2 100.00%. Physical Exam  Constitutional: She is oriented to person, place, and time. She appears well-developed and well-nourished.  Neck: Normal range of motion. Neck supple. No thyromegaly present.  Cardiovascular: Normal rate and regular rhythm.  Respiratory: Effort normal and breath sounds normal.  GI: Soft. Bowel sounds are normal. She exhibits no distension and no mass. There is no tenderness.  Genitourinary: Vagina normal and uterus normal.       IUD string present  Musculoskeletal: Normal range of motion.  Neurological: She is alert and oriented to person, place, and time.  Skin: Skin is warm and dry.  Psychiatric: She has a normal mood and affect.    Results for orders placed during the hospital encounter of 08/19/10 (from the past 24 hour(s))  PREGNANCY, URINE     Status:  Normal   Collection Time   08/19/10  9:47 AM      Component Value Range   Preg Test, Ur NEGATIVE      No results found.  Assessment/Plan: 35 year old with chronic pelvic pain and persistent 3cm ovarian cyst for >1year.  Planning laparoscopic removal, partly due to patient anxiety, and at same time will evaluate for endometriosis.  Dmarco Baldus,M SUZANNE 08/19/2010, 10:41 AM

## 2010-08-19 NOTE — Progress Notes (Signed)
Noted on arrival to short stay pt has two  marks on left arm. Upper area by inner bicep it looks like a triangle red with small scab and lower left arm has a line about 3 " long and 1/4 " wide. Pt stated she burned herself recently ironing. Pt. also has a purple bruise on her rt knee 2" diameter circle.

## 2010-08-19 NOTE — Anesthesia Preprocedure Evaluation (Addendum)
Anesthesia Evaluation  Name, MR# and DOB Patient awake  General Assessment Comment  Reviewed: Allergy & Precautions, H&P , Patient's Chart, lab work & pertinent test results and reviewed documented beta blocker date and time   History of Anesthesia Complications Negative for: history of anesthetic complications  Airway Mallampati: IV and II TM Distance: >3 FB Neck ROM: full    Dental No notable dental hx    Pulmonaryneg pulmonary ROS  sleep apnea  clear to auscultation  pulmonary exam normal   Cardiovascular Exercise Tolerance: Good regular Normal   Neuro/Psych Neuromuscular diseaseNegative Neurological ROS Negative Psych ROS  GI/Hepatic/Renal negative GI ROS, negative Liver ROS, and negative Renal ROS (+)       Endo/Other  Negative Endocrine ROS (+)  Hypothyroidism, Morbid obesity Abdominal   Musculoskeletal  (+) Fibromyalgia - Hematology negative hematology ROS (+)   Peds  Reproductive/Obstetrics negative OB ROS   Anesthesia Other Findings             Anesthesia Physical Anesthesia Plan  ASA: III  Anesthesia Plan: General   Post-op Pain Management:    Induction:   Airway Management Planned:   Additional Equipment:   Intra-op Plan:   Post-operative Plan:   Informed Consent: I have reviewed the patients History and Physical, chart, labs and discussed the procedure including the risks, benefits and alternatives for the proposed anesthesia with the patient or authorized representative who has indicated his/her understanding and acceptance.   Dental Advisory Given  Plan Discussed with: CRNA and Surgeon  Anesthesia Plan Comments:         Anesthesia Quick Evaluation

## 2010-08-19 NOTE — Transfer of Care (Signed)
Immediate Anesthesia Transfer of Care Note  Patient: Sally [name]  Procedure(s) Performed:  LAPAROSCOPY OPERATIVE - with Biopsy of uterine serosa; CYST REMOVAL - ovarian  Patient Location: PACU  Anesthesia Type: General  Level of Consciousness: awake, alert , oriented, patient cooperative and responds to stimulation  Airway & Oxygen Therapy: Patient Spontanous Breathing and Patient connected to nasal cannula oxygen  Post-op Assessment: Report given to PACU RN, Post -op Vital signs reviewed and stable and Patient moving all extremities  Post vital signs: Reviewed and stable  Complications: No apparent anesthesia complications

## 2010-08-19 NOTE — Op Note (Signed)
Operative Note   Date of surgery 08/19/2010   Operation:  Operative Laparoscopy with right ovarian cystectomy, biopsy of uterine serosal tissue  Pre-operative diagosis-: right persistent ovarian cyst, chronic pelvic pain, fibromyalgia, hypothyroidism  Post-operative diagnosis-: same as above  Surgeon Lum Keas  Assistant: Meredeth Ide, MD  Anesthesia: General endotracheal anesthesia, Dr. Brayton Caves oversaw the case  Blood loss: 25cc  Urine output: 150cc, concentrated  Fluids:  1000cc LR  Findings: adhesions in the left upper quadrant consistent with history of lap band, regressing left ovarian cyst, right simple appearing ovarian cyst, cystic lesions on uterine serosa with filmy adhesions present--question possible endometriosis, otherwise negative pelvis  Specimen- right ovarian cyst, biopsy of uterine serosal lesions sent to Pathology  Complications- None  Drains: None  Indications:  [name] is a 35 year old G1 P1 single white female with a history of chronic pelvic pain and a persistent right ovarian cyst. This cyst has been followed with ultrasounds over the last year and a half. Because the pain has not improved and because she has some anxiety regarding this cyst we have discussed surgical removal. At the same time she and I have discussed evaluating for the presence or absence of endometriosis in managing this appropriately. Patient has been counseled about risks and benefits and all this is documented in her office chart. She presents today for the procedure and is ready to proceed.  Operative Note:  [name] was admitted to same-day surgery and taken to the operating room. She was placed in the supine position. A running IV was present in her right arm. Armboard extensions were placed on either side of the operative table. General endotracheal anesthesia was administered by the anesthesia staff  without difficulty. Patient's legs were then placed in the low lithotomy position in West stirrups. SCD's were present and functioning throughout the procedure.  The abdomen was prepped with chlor prep. The inner thighs, vagina, and perineum were prepped with Betadine prep. The legs were lifted to the high lithotomy position. A bivalve speculum was placed in the vagina. The anterior lip of the cervix was grasped with a single-tooth tenaculum. An acorn uterine manipulator was advanced into the cervical os and attached to the tenaculum as a means of manipulating the uterus during the procedure. The bivalve speculum was removed. A Foley catheter was placed to straight drain. The patient was draped in the normal sterile fashion. Legs were placed now in the low lithotomy position. Attention was turned to the abdomen. 0.25% Marcaine was used the anesthetize the skin beneath the umbilicus. Using a #11 blade a 10 mm skin incision was made.  The subcutaneous tissue with dissected with a hemostat. The fascia was identified. A Veress needle was obtained. The abdomen was elevated and the needle was passed through the abdominal wall layers. The peritoneum was popped through.  A syringe of normal saline was obtained.  The syringe was attached to the open Veress needle and aspiration was performed in there was no blood or fluid noted. An injection of saline was performed and then an aspiration was performed again.  No blood, fluid, or saline was obtained. Then under low flow of CO2 gas, pneumoperitoneum was obtained.  3-1/2 L of CO2 gas was instilled in the abdomen before the Veress needle was removed. The laparoscope was attached to a #10 disposable Optiview trocar and port. The abdomen was elevated and with a twisting motion this device was passed through the abdominal wall layers. Once intra-abdominal placement was noted the  trocar was removed and the laparoscope was used to visualize throughout the pelvis and upper abdomen.  The above findings were noted.  The right and left lower quadrant abdominal walls were transilluminated to identify locations for the right and left lower quadrant ports to be used during the procedure. The skin was anesthetized with .025% Marcaine and #11 blades were used to nick the skin.  5 mm skin incisions were placed in the right and left lower quadrants. 5 mm non-bladed trochars and ports were placed in the right and left lower quadrants under direct visualization of the laparoscope. The trochars were removed and a blunt probe was obtained. The blunt probe was then used to lift the ovaries. There is no evidence of endometriosis behind the ovaries or in the cul-de-sac. The anterior cul-de-sac had some scarring the along the lower uterine segment, consistent with scar from prior C-section.  There were vesicular appearing lesions on the uterine serosa on the left side near the left round ligament. Otherwise the pelvis and peritoneum appeared normal. The ureters were noted bilaterally. The upper abdomen was surveyed. There were some adhesions of the omentum near the gallbladder on the right side.  The liver edge appeared normal. She is status post lap band placement. The band and appropriate adhesions were noted in the left upper quadrant.  At this point the surgical procedure was initiated. The uterus was elevated and placed on stretch of the patient's left. A Cobra grasper was obtained and used to grab the right utero-ovarian ligament. In the left port, a pair of disposable endoscopic scissors was passed. Monopolar cautery was attached to this. An area was cauterized across the cyst and then incised with the monopolar scissors.  This incision was initially just through the ovarian tissue.  The cyst wall and fluid inside was still intact.  Using the monopolar scissors and an opening motion, the cyst was was teased free from the ovarian tissue.  Once at least a third of the cyst was freed, it was grasped with  the Cobra grasped.  Using traction and counter traction, the cyst was freed completed from the right ovary.  The cyst was still intact and brought out through the left port and handed off to be sent to pathology. Using a Nezhat suction irrigator the ovary irrigated.  There were some small areas of bleeding in the bed of the ovarian cyst which were made hemostatic with monopolar cautery. The Nezhat suction irrigator was then used to irrigate the ovary again and no bleeding was noted. The pelvis was irrigated and the irrigant was removed out of the pelvis. A piece of Interceed was wrapped around the right ovary to prevent any lesions in the future.   Then the lesion is on the serosa of the uterus was grasped with a Cobra grasper and using the monopolar scissors this tissue was excised. The tissue was handed off as a separate pathology specimen. The area of the serosa of the uterus was bleeding ever so slightly and this was cauterized with monopolar cautery. The suction irrigator then was used.  Hemostasis was present where the tissue biopsy was performed and throughout the pelvis. At this point the procedure was ended the instruments were removed through the right and left lower quadrant ports. The ports were removed under direct visualization of the laparoscope.  Laparoscope was removed. The patient was taken out of Trendelenburg positioning. Before the midline port was removed the CRNA gave the patient several deep breaths.  At the same  time, the operator and assistant were compressing the abdomen to try and get any gas to excape from the midline port. Once this was done 3 times a midline port was removed. Then the midline incision was closed at the fascial level with a figure-of-eight suture of #0 Vicryl. The skin was closed with subcuticular stitch of 3-0 Vicryl. The incisions were cleansed Dermabond was placed on the skin.  Attention was turned to the vagina the tenaculum and acorn uterine manipulator were  removed. There was no bleeding noted from the cervix. The catheter was removed. Betadine prep was washed from the skin.  Sponge, lap, needle, and instrument counts were correct x2.  Legs were positioned in supine position. She was awake from anesthesia, extubated, and taken to the recovery room in stable condition. At this point to two pathology specimens are pending.    Lum Keas MD, 12:55 PM 08/19/2010

## 2010-08-19 NOTE — Anesthesia Postprocedure Evaluation (Signed)
  Anesthesia Post-op Note  Patient: Sally [name]  Procedure(s) Performed:  LAPAROSCOPY OPERATIVE - with Biopsy of uterine serosa; CYST REMOVAL - ovarian Patient's cardiopulmonary status is stable Patient's level of consciousness: sedate but responsive verbally Pain and nausea are all reasonably controlled No anesthetic complications apparent at this time No follow up care necessary at this time

## 2010-10-15 ENCOUNTER — Encounter (HOSPITAL_COMMUNITY): Payer: Self-pay | Admitting: Obstetrics & Gynecology

## 2010-11-11 ENCOUNTER — Emergency Department (HOSPITAL_COMMUNITY)
Admission: EM | Admit: 2010-11-11 | Discharge: 2010-11-11 | Disposition: A | Discharge status: Home / Self Care | Payer: BC Managed Care – PPO | Attending: Emergency Medicine | Admitting: Emergency Medicine

## 2010-11-11 DIAGNOSIS — R3915 Urgency of urination: Secondary | ICD-10-CM | POA: Insufficient documentation

## 2010-11-11 DIAGNOSIS — R350 Frequency of micturition: Secondary | ICD-10-CM | POA: Insufficient documentation

## 2010-11-11 DIAGNOSIS — R319 Hematuria, unspecified: Secondary | ICD-10-CM | POA: Insufficient documentation

## 2010-11-11 DIAGNOSIS — R300 Dysuria: Secondary | ICD-10-CM | POA: Insufficient documentation

## 2010-11-11 DIAGNOSIS — N390 Urinary tract infection, site not specified: Secondary | ICD-10-CM | POA: Insufficient documentation

## 2010-11-11 DIAGNOSIS — E039 Hypothyroidism, unspecified: Secondary | ICD-10-CM | POA: Insufficient documentation

## 2010-11-11 LAB — URINALYSIS, ROUTINE W REFLEX MICROSCOPIC
Nitrite: NEGATIVE
Specific Gravity, Urine: >1.03 — ABNORMAL HIGH (ref 1.005–1.030)
Urobilinogen, UA: 0.2 mg/dL (ref 0.0–1.0)
pH: 6 (ref 5.0–8.0)

## 2010-11-11 LAB — URINE MICROSCOPIC-ADD ON

## 2010-11-11 LAB — POCT PREGNANCY, URINE: Preg Test, Ur: NEGATIVE

## 2010-11-13 LAB — URINE CULTURE
Colony Count: >=100000
Culture  Setup Time: 201210221439

## 2011-01-06 ENCOUNTER — Encounter (INDEPENDENT_AMBULATORY_CARE_PROVIDER_SITE_OTHER): Payer: Self-pay | Admitting: Surgery

## 2011-01-29 ENCOUNTER — Telehealth (INDEPENDENT_AMBULATORY_CARE_PROVIDER_SITE_OTHER): Payer: Self-pay | Admitting: General Surgery

## 2011-01-29 NOTE — Telephone Encounter (Signed)
Patient is [redacted] weeks pregnant and suffering with n/v from morning sickness. She would like to know if she should have her fluid removed from her lap band?

## 2011-01-31 ENCOUNTER — Encounter (INDEPENDENT_AMBULATORY_CARE_PROVIDER_SITE_OTHER): Payer: Self-pay

## 2011-01-31 ENCOUNTER — Ambulatory Visit (INDEPENDENT_AMBULATORY_CARE_PROVIDER_SITE_OTHER): Payer: BC Managed Care – PPO | Admitting: Physician Assistant

## 2011-01-31 VITALS — BP 126/80 | HR 68 | Temp 97.6°F | Resp 18 | Ht 68.0 in | Wt 237.2 lb

## 2011-01-31 DIAGNOSIS — Z4651 Encounter for fitting and adjustment of gastric lap band: Secondary | ICD-10-CM

## 2011-01-31 NOTE — Patient Instructions (Signed)
Return if you have difficulties or after you deliver.

## 2011-01-31 NOTE — Progress Notes (Signed)
  HISTORY: Sally [name] is a 36 y.o.female who received an AP-Standard lap-band in April 2008 by Dr. Daphine Deutscher. She is six weeks pregnant and is having persistent nausea and vomiting which she attributes to morning sickness. She would like to have the fluid in her band removed.Marland Kitchen  VITAL SIGNS: Filed Vitals:   01/31/11 1545  BP: 126/80  Pulse: 68  Temp: 97.6 F (36.4 C)  Resp: 18    PHYSICAL EXAM: Physical exam reveals a very well-appearing 36 y.o.female in no apparent distress Neurologic: Awake, alert, oriented Psych: Bright affect, conversant Respiratory: Breathing even and unlabored. No stridor or wheezing Abdomen: Soft, nontender, nondistended to palpation. Incisions well-healed. No incisional hernias. Port easily palpated. Extremities: Atraumatic, good range of motion.  ASSESMENT: 36 y.o.  female  s/p AP-Standard lap-band.   PLAN: The patient's port was accessed with a 20G Huber needle without difficulty. Clear fluid was aspirated and 6.5 mL saline was removed from the port to give a total predicted volume of 0 mL. The patient was able to swallow water without difficulty following the procedure and was instructed to take clear liquids for the next 24-48 hours and advance slowly as tolerated.

## 2011-02-07 ENCOUNTER — Encounter (INDEPENDENT_AMBULATORY_CARE_PROVIDER_SITE_OTHER): Payer: BC Managed Care – PPO

## 2011-02-20 LAB — OB RESULTS CONSOLE ANTIBODY SCREEN: Antibody Screen: NEGATIVE

## 2011-02-20 LAB — OB RESULTS CONSOLE HEPATITIS B SURFACE ANTIGEN: Hepatitis B Surface Ag: NEGATIVE

## 2011-02-20 LAB — OB RESULTS CONSOLE RUBELLA ANTIBODY, IGM: Rubella: IMMUNE

## 2011-02-20 LAB — OB RESULTS CONSOLE ABO/RH: RH Type: POSITIVE

## 2011-02-20 LAB — OB RESULTS CONSOLE GC/CHLAMYDIA: Chlamydia: NEGATIVE

## 2011-03-03 ENCOUNTER — Other Ambulatory Visit: Payer: Self-pay

## 2011-03-20 ENCOUNTER — Other Ambulatory Visit: Payer: Self-pay

## 2011-03-28 ENCOUNTER — Inpatient Hospital Stay (HOSPITAL_COMMUNITY): Payer: BC Managed Care – PPO

## 2011-03-28 ENCOUNTER — Inpatient Hospital Stay (HOSPITAL_COMMUNITY)
Admission: AD | Admit: 2011-03-28 | Discharge: 2011-03-28 | Disposition: A | Discharge status: Home Health | Payer: BC Managed Care – PPO | Source: Ambulatory Visit | Attending: Obstetrics and Gynecology | Admitting: Obstetrics and Gynecology

## 2011-03-28 ENCOUNTER — Encounter (HOSPITAL_COMMUNITY): Payer: Self-pay

## 2011-03-28 DIAGNOSIS — M545 Low back pain, unspecified: Secondary | ICD-10-CM | POA: Insufficient documentation

## 2011-03-28 DIAGNOSIS — O9989 Other specified diseases and conditions complicating pregnancy, childbirth and the puerperium: Secondary | ICD-10-CM

## 2011-03-28 DIAGNOSIS — R109 Unspecified abdominal pain: Secondary | ICD-10-CM

## 2011-03-28 DIAGNOSIS — O99891 Other specified diseases and conditions complicating pregnancy: Secondary | ICD-10-CM | POA: Insufficient documentation

## 2011-03-28 DIAGNOSIS — O26899 Other specified pregnancy related conditions, unspecified trimester: Secondary | ICD-10-CM

## 2011-03-28 LAB — URINALYSIS, ROUTINE W REFLEX MICROSCOPIC
Glucose, UA: NEGATIVE mg/dL
Ketones, ur: NEGATIVE mg/dL
Leukocytes, UA: NEGATIVE
Nitrite: NEGATIVE
Protein, ur: NEGATIVE mg/dL
Urobilinogen, UA: 0.2 mg/dL (ref 0.0–1.0)

## 2011-03-28 NOTE — Discharge Instructions (Signed)
Abdominal Pain During Pregnancy  Abdominal discomfort is common in pregnancy. Most of the time, it does not cause harm. There are many causes of abdominal pain. Some causes are more serious than others. Some of the causes of abdominal pain in pregnancy are easily diagnosed. Occasionally, the diagnosis takes time to understand. Other times, the cause is not determined. Abdominal pain can be a sign that something is very wrong with the pregnancy, or the pain may have nothing to do with the pregnancy at all. For this reason, always tell your caregiver if you have any abdominal discomfort.  CAUSES  Common and harmless causes of abdominal pain include:   Constipation.   Excess gas and bloating.   Round ligament pain. This is pain that is felt in the folds of the groin.   The position the baby or placenta is in.   Baby kicks.   Braxton-Hicks contractions. These are mild contractions that do not cause cervical dilation.  Serious causes of abdominal pain include:   Ectopic pregnancy. This happens when a fertilized egg implants outside of the uterus.   Miscarriage.   Preterm labor. This is when labor starts at less than 37 weeks of pregnancy.   Placental abruption. This is when the placenta partially or completely separates from the uterus.   Preeclampsia. This is often associated with high blood pressure and has been referred to as "toxemia in pregnancy."   Uterine or amniotic fluid infections.  Causes unrelated to pregnancy include:   Urinary tract infection.   Gallbladder stones or inflammation.   Hepatitis or other liver illness.   Intestinal problems, stomach flu, food poisoning, or ulcer.   Appendicitis.   Kidney (renal) stones.   Kidney infection (pylonephritis).  HOME CARE INSTRUCTIONS   For mild pain:   Do not have sexual intercourse or put anything in your vagina until your symptoms go away completely.   Get plenty of rest until your pain improves. If your pain does not improve in 1 hour, call  your caregiver.   Drink clear fluids if you feel nauseous. Avoid solid food as long as you are uncomfortable or nauseous.   Only take medicine as directed by your caregiver.   Keep all follow-up appointments with your caregiver.  SEEK IMMEDIATE MEDICAL CARE IF:   You are bleeding, leaking fluid, or passing tissue from the vagina.   You have increasing pain or cramping.   You have persistent vomiting.   You have painful or bloody urination.   You have a fever.   You notice a decrease in your baby's movements.   You have extreme weakness or feel faint.   You have shortness of breath, with or without abdominal pain.   You develop a severe headache with abdominal pain.   You have abnormal vaginal discharge with abdominal pain.   You have persistent diarrhea.   You have abdominal pain that continues even after rest, or gets worse.  MAKE SURE YOU:    Understand these instructions.   Will watch your condition.   Will get help right away if you are not doing well or get worse.  Document Released: 01/06/2005 Document Revised: 12/26/2010 Document Reviewed: 08/02/2010  [employer] Patient Information 2012 [employer].

## 2011-03-28 NOTE — Progress Notes (Signed)
Patient states she has been having lower abdominal and low back pain and pressure. No bleeding or leaking. Has periods of feeling dizzy./

## 2011-03-28 NOTE — ED Provider Notes (Signed)
History     Chief Complaint  Patient presents with  . Abdominal Pain   HPI  Patient states she has been having lower abdominal and low back pain and pressure. No bleeding or leaking. Denies abnormal vaginal discharge.     Past Medical History  Diagnosis Date  . Hypothyroidism   . Sleep apnea     pre lap banding  . Anxiety   . Fibromyalgia     Past Surgical History  Procedure Date  . Cesarean section   . Laparoscopic gastric banding 1997  . Knee surgery   . Brain surgery 07/1989    blood clot removed  . Laparoscopy 08/19/2010    Procedure: LAPAROSCOPY OPERATIVE;  Surgeon: Lum Keas;  Location: [facility];  Service: Gynecology;  Laterality: N/A;  with Biopsy of uterine serosa  . Ear cyst excision 08/19/2010    Procedure: CYST REMOVAL;  Surgeon: Lum Keas;  Location: [facility];  Service: Gynecology;  Laterality: Right;  ovarian    Family History  Problem Relation Age of Onset  . Hypertension Maternal Grandfather   . Diabetes Paternal Grandmother     History  Substance Use Topics  . Smoking status: Current Everyday Smoker -- 0.2 packs/day  . Smokeless tobacco: Never Used  . Alcohol Use: Yes     rarely    Allergies:  Allergies  Allergen Reactions  . Paxil Rash  . Percocet (Oxycodone-Acetaminophen) Itching  . Codeine Other (See Comments)    Puts patient to sleep "knocks her out" for at least two days, regardless of dosage.    Prescriptions prior to admission  Medication Sig Dispense Refill  . acetaminophen (TYLENOL) 325 MG tablet Take 650 mg by mouth every 6 (six) hours as needed. Hip pain      . levothyroxine (SYNTHROID, LEVOTHROID) 137 MCG tablet Take 137 mcg by mouth 2 (two) times daily.      . Prenatal Vit-Fe Fumarate-FA (PRENATAL MULTIVITAMIN) TABS Take 1 tablet by mouth daily.        Review of Systems  Gastrointestinal: Positive for abdominal pain.  Musculoskeletal: Positive for back pain.  Neurological: Positive for dizziness (occasional ).    All other systems reviewed and are negative.   Physical Exam   Blood pressure 110/57, pulse 69, temperature 97.8 F (36.6 C), temperature source Oral, resp. rate 18, height 5\' 9"  (1.753 m), weight 114.125 kg (251 lb 9.6 oz), SpO2 100.00%.  Physical Exam  Constitutional: She is oriented to person, place, and time. She appears well-developed and well-nourished. No distress.  HENT:  Head: Normocephalic.  Neck: Normal range of motion. Neck supple.  Cardiovascular: Normal rate, regular rhythm and normal heart sounds.   Respiratory: Effort normal and breath sounds normal. No respiratory distress.  GI: Soft. There is tenderness (mid right side). There is no CVA tenderness.  Genitourinary: Cervix exhibits no motion tenderness and no discharge.       +FHTs; cervix closed  Musculoskeletal: Normal range of motion.  Neurological: She is alert and oriented to person, place, and time.  Skin: Skin is warm and dry.  Psychiatric: She has a normal mood and affect.    MAU Course  Procedures  Ultrasound - no fibroid, normal fluid   Assessment and Plan  Abdominal Pain in Pregnancy  Plan: DC to home Provided reassurance Continue medication for pain Consulted with Dr. Rana Snare, agrees with plan.  [facility] 03/28/2011, 5:34 PM

## 2011-04-24 ENCOUNTER — Ambulatory Visit (INDEPENDENT_AMBULATORY_CARE_PROVIDER_SITE_OTHER): Payer: BC Managed Care – PPO | Admitting: Family Medicine

## 2011-04-24 VITALS — BP 104/66 | HR 76 | Temp 98.2°F | Resp 16 | Ht 69.0 in | Wt 252.0 lb

## 2011-04-24 DIAGNOSIS — J029 Acute pharyngitis, unspecified: Secondary | ICD-10-CM

## 2011-04-24 DIAGNOSIS — J111 Influenza due to unidentified influenza virus with other respiratory manifestations: Secondary | ICD-10-CM

## 2011-04-24 DIAGNOSIS — J329 Chronic sinusitis, unspecified: Secondary | ICD-10-CM

## 2011-04-24 DIAGNOSIS — R6889 Other general symptoms and signs: Secondary | ICD-10-CM

## 2011-04-24 LAB — POCT RAPID STREP A (OFFICE): Rapid Strep A Screen: NEGATIVE

## 2011-04-24 LAB — POCT INFLUENZA A/B
Influenza A, POC: NEGATIVE
Influenza B, POC: NEGATIVE

## 2011-04-24 MED ORDER — AMOXICILLIN-POT CLAVULANATE 875-125 MG PO TABS: 1.0000 | ORAL_TABLET | Freq: Two times a day (BID) | ORAL | Status: AC

## 2011-04-24 NOTE — Progress Notes (Signed)
[facility]:  Office Visit  Chief Complaint:  Chief Complaint  Patient presents with  . URI    since Monday  . Sore Throat  . Ear Fullness  . Abdominal Cramping    today    HPI: Sally [name] is a 36 y.o. female who complains of  3 day hisotry of flu like sxs, subejctive fevers, chills, cough. Sore throat. Sinus pressure. She is pregnant. Tried Claritin and Tylenol without relief. + smoker  Past Medical History  Diagnosis Date  . Hypothyroidism   . Sleep apnea     pre lap banding  . Anxiety   . Fibromyalgia    Past Surgical History  Procedure Date  . Cesarean section   . Laparoscopic gastric banding 1997  . Knee surgery   . Brain surgery 07/1989    blood clot removed  . Laparoscopy 08/19/2010    Procedure: LAPAROSCOPY OPERATIVE;  Surgeon: Lum Keas;  Location: [facility];  Service: Gynecology;  Laterality: N/A;  with Biopsy of uterine serosa  . Ear cyst excision 08/19/2010    Procedure: CYST REMOVAL;  Surgeon: Lum Keas;  Location: [facility];  Service: Gynecology;  Laterality: Right;  ovarian   History   Social History  . Marital Status: Single    Spouse Name: N/A    Number of Children: N/A  . Years of Education: N/A   Social History Main Topics  . Smoking status: Current Everyday Smoker -- 0.2 packs/day  . Smokeless tobacco: Never Used  . Alcohol Use: Yes     rarely  . Drug Use: No  . Sexually Active: None   Other Topics Concern  . None   Social History Narrative  . None   Family History  Problem Relation Age of Onset  . Hypertension Maternal Grandfather   . Diabetes Paternal Grandmother    Allergies  Allergen Reactions  . Paxil Rash  . Percocet (Oxycodone-Acetaminophen) Itching  . Codeine Other (See Comments)    Puts patient to sleep "knocks her out" for at least two days, regardless of dosage.   Prior to Admission medications   Medication Sig Start Date End Date Taking? Authorizing Provider  acetaminophen  (TYLENOL) 325 MG tablet Take 650 mg by mouth every 6 (six) hours as needed. Hip pain   Yes Historical Provider, MD  levothyroxine (SYNTHROID, LEVOTHROID) 137 MCG tablet Take 137 mcg by mouth 2 (two) times daily.   Yes Historical Provider, MD  loratadine (CLARITIN) 10 MG tablet Take 10 mg by mouth daily.   Yes Historical Provider, MD  Prenatal Vit-Fe Fumarate-FA (PRENATAL MULTIVITAMIN) TABS Take 1 tablet by mouth daily.   Yes Historical Provider, MD     ROS: The patient denies fevers, chills, night sweats, unintentional weight loss, chest pain, palpitations, wheezing, dyspnea on exertion, nausea, vomiting, abdominal pain, dysuria, hematuria, melena, numbness, weakness, or tingling.   All other systems have been reviewed and were otherwise negative with the exception of those mentioned in the HPI and as above.    PHYSICAL EXAM: Filed Vitals:   04/24/11 1404  BP: 104/66  Pulse: 76  Temp: 98.2 F (36.8 C)  Resp: 16   Filed Vitals:   04/24/11 1404  Height: 5\' 9"  (1.753 m)  Weight: 252 lb (114.306 kg)   Body mass index is 37.21 kg/(m^2).  General: Alert, no acute distress HEENT:  Normocephalic, atraumatic, oropharynx patent. TM nl, + red 3 tonsil. + sinus tenderness.  Cardiovascular:  Regular rate and rhythm,  no rubs murmurs or gallops.  No Carotid bruits, radial pulse intact. No pedal edema.  Respiratory: Clear to auscultation bilaterally.  No wheezes, rales, or rhonchi.  No cyanosis, no use of accessory musculature GI: No organomegaly, abdomen is soft and non-tender, positive bowel sounds.  No masses. Skin: No rashes. Neurologic: Facial musculature symmetric. Psychiatric: Patient is appropriate throughout our interaction. Lymphatic: No cervical lymphadenopathy Musculoskeletal: Gait intact.   LABS: Results for orders placed in visit on 04/24/11  POCT INFLUENZA A/B      Component Value Range   Influenza A, POC Negative     Influenza B, POC Negative    POCT RAPID STREP A  (OFFICE)      Component Value Range   Rapid Strep A Screen Negative  Negative      EKG/XRAY:   Primary read interpreted by Dr. Conley Rolls at [facility].   ASSESSMENT/PLAN: Encounter Diagnoses  Name Primary?  . Flu-like symptoms Yes  . Sinusitis   . Pharyngitis    Augmentin 875 mg BID x 10 Days Sxs treatment for chills, fevers, cough first and if continue to feel bad then may take abx. Advise to stop smoking since pregnant    Alesha Jaffee PHUONG, DO 04/24/2011 3:37 PM

## 2011-05-01 ENCOUNTER — Other Ambulatory Visit (HOSPITAL_COMMUNITY): Payer: Self-pay | Admitting: Obstetrics and Gynecology

## 2011-05-01 DIAGNOSIS — IMO0001 Reserved for inherently not codable concepts without codable children: Secondary | ICD-10-CM

## 2011-05-05 ENCOUNTER — Ambulatory Visit (HOSPITAL_COMMUNITY)
Admission: RE | Admit: 2011-05-05 | Discharge: 2011-05-05 | Disposition: A | Discharge status: Home / Self Care | Payer: BC Managed Care – PPO | Source: Ambulatory Visit | Attending: Obstetrics and Gynecology | Admitting: Obstetrics and Gynecology

## 2011-05-05 ENCOUNTER — Encounter (HOSPITAL_COMMUNITY): Payer: Self-pay

## 2011-05-05 VITALS — BP 132/57 | HR 80 | Wt 256.0 lb

## 2011-05-05 DIAGNOSIS — O34219 Maternal care for unspecified type scar from previous cesarean delivery: Secondary | ICD-10-CM | POA: Insufficient documentation

## 2011-05-05 DIAGNOSIS — O09529 Supervision of elderly multigravida, unspecified trimester: Secondary | ICD-10-CM | POA: Insufficient documentation

## 2011-05-05 DIAGNOSIS — E079 Disorder of thyroid, unspecified: Secondary | ICD-10-CM | POA: Insufficient documentation

## 2011-05-05 DIAGNOSIS — IMO0001 Reserved for inherently not codable concepts without codable children: Secondary | ICD-10-CM

## 2011-05-05 DIAGNOSIS — O99330 Smoking (tobacco) complicating pregnancy, unspecified trimester: Secondary | ICD-10-CM | POA: Insufficient documentation

## 2011-05-26 ENCOUNTER — Encounter (HOSPITAL_COMMUNITY): Payer: Self-pay | Admitting: *Deleted

## 2011-05-26 ENCOUNTER — Inpatient Hospital Stay (HOSPITAL_COMMUNITY)
Admission: AD | Admit: 2011-05-26 | Discharge: 2011-05-26 | Disposition: A | Discharge status: Home / Self Care | Payer: BC Managed Care – PPO | Source: Ambulatory Visit | Attending: Obstetrics and Gynecology | Admitting: Obstetrics and Gynecology

## 2011-05-26 DIAGNOSIS — N898 Other specified noninflammatory disorders of vagina: Secondary | ICD-10-CM

## 2011-05-26 DIAGNOSIS — O99891 Other specified diseases and conditions complicating pregnancy: Secondary | ICD-10-CM | POA: Insufficient documentation

## 2011-05-26 DIAGNOSIS — O9989 Other specified diseases and conditions complicating pregnancy, childbirth and the puerperium: Secondary | ICD-10-CM

## 2011-05-26 LAB — URINALYSIS, ROUTINE W REFLEX MICROSCOPIC
Glucose, UA: NEGATIVE mg/dL
Leukocytes, UA: NEGATIVE
Specific Gravity, Urine: >1.03 — ABNORMAL HIGH (ref 1.005–1.030)
pH: 6 (ref 5.0–8.0)

## 2011-05-26 NOTE — MAU Note (Signed)
Pt states her underwear was wet around 1830.  States it had a strong ammonia smell.  Denies any bleeding.  Reports occasional lower abdominal cramping that doesn't feel like contractions.

## 2011-05-26 NOTE — MAU Provider Note (Signed)
History     CSN: [id number]  Arrival date and time: 05/26/11 2015   First Provider Initiated Contact with Patient 05/26/11 2051      Chief Complaint  Patient presents with  . Rupture of Membranes   HPI Pt is [redacted]w[redacted]d pregnant and presents with leakage of fluid about 5:30pm today.  Pt states the fluid smelled like ammonia.  Pt said her underwear was wet.  She has a history of early leakage of fluid with her last pregnancy and carried the baby until 37 weeks.  Pt denies contractions or spotting or bleeding or UTI symptoms.  Past Medical History  Diagnosis Date  . Hypothyroidism   . Sleep apnea     pre lap banding  . Anxiety   . Fibromyalgia     Past Surgical History  Procedure Date  . Cesarean section   . Laparoscopic gastric banding 1997  . Knee surgery   . Brain surgery 07/1989    blood clot removed  . Laparoscopy 08/19/2010    Procedure: LAPAROSCOPY OPERATIVE;  Surgeon: Lum Keas;  Location: [facility];  Service: Gynecology;  Laterality: N/A;  with Biopsy of uterine serosa  . Ear cyst excision 08/19/2010    Procedure: CYST REMOVAL;  Surgeon: Lum Keas;  Location: [facility];  Service: Gynecology;  Laterality: Right;  ovarian    Family History  Problem Relation Age of Onset  . Hypertension Maternal Grandfather   . Diabetes Paternal Grandmother   . Anesthesia problems Neg Hx     History  Substance Use Topics  . Smoking status: Current Everyday Smoker -- 0.2 packs/day    Types: Cigarettes  . Smokeless tobacco: Never Used  . Alcohol Use: Yes     rarely    Allergies:  Allergies  Allergen Reactions  . Paroxetine Hcl Rash  . Percocet (Oxycodone-Acetaminophen) Itching  . Codeine Other (See Comments)    Puts patient to sleep "knocks her out" for at least two days, regardless of dosage.    Prescriptions prior to admission  Medication Sig Dispense Refill  . acetaminophen (TYLENOL) 325 MG tablet Take 650 mg by mouth every 6 (six) hours as needed. Hip pain      .  levothyroxine (SYNTHROID, LEVOTHROID) 137 MCG tablet Take 137 mcg by mouth 2 (two) times daily.      Marland Kitchen loratadine (CLARITIN) 10 MG tablet Take 10 mg by mouth daily.      . Prenatal Vit-Fe Fumarate-FA (PRENATAL MULTIVITAMIN) TABS Take 1 tablet by mouth at bedtime.         ROS Physical Exam   Blood pressure 133/76, pulse 91, temperature 97.9 F (36.6 C), temperature source Oral, resp. rate 18, height 5\' 7"  (1.702 m), weight 258 lb 12.8 oz (117.391 kg), last menstrual period 12/19/2010.  Physical Exam  Vitals reviewed. Constitutional: She is oriented to person, place, and time. She appears well-developed and well-nourished.  HENT:  Head: Normocephalic.  Eyes: Pupils are equal, round, and reactive to light.  Neck: Normal range of motion. Neck supple.  Cardiovascular: Normal rate.   Respiratory: Effort normal.  GI: Soft. She exhibits no distension. There is no tenderness. There is no rebound.  Genitourinary: Vagina normal.       Small amount of creamy white discharge in vault; cervix closed, long, NT.  FHT audible with doppler  Musculoskeletal: Normal range of motion.  Neurological: She is alert and oriented to person, place, and time.  Skin: Skin is warm and dry.  Psychiatric: She has a  normal mood and affect.    MAU Course  Procedures Results for orders placed during the hospital encounter of 05/26/11 (from the past 24 hour(s))  URINALYSIS, ROUTINE W REFLEX MICROSCOPIC     Status: Abnormal   Collection Time   05/26/11  8:30 PM      Component Value Range   Color, Urine YELLOW  YELLOW    APPearance HAZY (*) CLEAR    Specific Gravity, Urine >1.030 (*) 1.005 - 1.030    pH 6.0  5.0 - 8.0    Glucose, UA NEGATIVE  NEGATIVE (mg/dL)   Hgb urine dipstick NEGATIVE  NEGATIVE    Bilirubin Urine NEGATIVE  NEGATIVE    Ketones, ur NEGATIVE  NEGATIVE (mg/dL)   Protein, ur NEGATIVE  NEGATIVE (mg/dL)   Urobilinogen, UA 0.2  0.0 - 1.0 (mg/dL)   Nitrite NEGATIVE  NEGATIVE    Leukocytes, UA  NEGATIVE  NEGATIVE   AMNISURE RUPTURE OF MEMBRANE (ROM)     Status: Normal   Collection Time   05/26/11  8:55 PM      Component Value Range   Amnisure ROM NEGATIVE    ferning-negative Discussed with Dr. Vincente Poli- because of pt's history- Amnisure sent Assessment and Plan  D/c home F/u for Ob appointment  Lon Klippel 05/26/2011, 9:08 PM

## 2011-05-26 NOTE — MAU Note (Signed)
Pt G2 P1 at 22.4wks noticed wet panties with an ammonia smell around 1830.  Mild cramping, denies bleeding.

## 2011-06-02 ENCOUNTER — Ambulatory Visit (HOSPITAL_COMMUNITY)
Admission: RE | Admit: 2011-06-02 | Discharge: 2011-06-02 | Disposition: A | Discharge status: Home / Self Care | Payer: BC Managed Care – PPO | Source: Ambulatory Visit | Attending: Obstetrics and Gynecology | Admitting: Obstetrics and Gynecology

## 2011-06-02 DIAGNOSIS — O99330 Smoking (tobacco) complicating pregnancy, unspecified trimester: Secondary | ICD-10-CM | POA: Insufficient documentation

## 2011-06-02 DIAGNOSIS — E079 Disorder of thyroid, unspecified: Secondary | ICD-10-CM | POA: Insufficient documentation

## 2011-06-02 DIAGNOSIS — O34219 Maternal care for unspecified type scar from previous cesarean delivery: Secondary | ICD-10-CM | POA: Insufficient documentation

## 2011-06-02 DIAGNOSIS — O09529 Supervision of elderly multigravida, unspecified trimester: Secondary | ICD-10-CM | POA: Insufficient documentation

## 2011-06-02 DIAGNOSIS — IMO0001 Reserved for inherently not codable concepts without codable children: Secondary | ICD-10-CM

## 2011-06-02 DIAGNOSIS — E039 Hypothyroidism, unspecified: Secondary | ICD-10-CM | POA: Insufficient documentation

## 2011-08-13 ENCOUNTER — Ambulatory Visit (HOSPITAL_COMMUNITY)
Admission: RE | Admit: 2011-08-13 | Discharge: 2011-08-13 | Disposition: A | Discharge status: Home / Self Care | Payer: BC Managed Care – PPO | Source: Ambulatory Visit | Attending: Obstetrics and Gynecology | Admitting: Obstetrics and Gynecology

## 2011-08-13 DIAGNOSIS — M7989 Other specified soft tissue disorders: Secondary | ICD-10-CM

## 2011-08-13 DIAGNOSIS — R609 Edema, unspecified: Secondary | ICD-10-CM

## 2011-08-13 NOTE — Progress Notes (Signed)
VASCULAR LAB PRELIMINARY  PRELIMINARY  PRELIMINARY  PRELIMINARY  Right lower extremity venous duplex completed.    Preliminary report:  Right:  No evidence of DVT, superficial thrombosis, or Baker's cyst.  Franklin Baumbach, RVS  08/13/2011, 6:00 PM

## 2011-08-21 HISTORY — PX: TUBAL LIGATION: SHX77

## 2011-09-03 ENCOUNTER — Encounter (HOSPITAL_COMMUNITY): Payer: Self-pay | Admitting: Pharmacist

## 2011-09-12 ENCOUNTER — Inpatient Hospital Stay (HOSPITAL_COMMUNITY)
Admission: RE | Admit: 2011-09-12 | Discharge: 2011-09-12 | Payer: BC Managed Care – PPO | Source: Ambulatory Visit | Attending: Obstetrics and Gynecology | Admitting: Obstetrics and Gynecology

## 2011-09-12 ENCOUNTER — Other Ambulatory Visit (HOSPITAL_COMMUNITY): Payer: BC Managed Care – PPO

## 2011-09-12 NOTE — Pre-Procedure Instructions (Signed)
Pt arrived for pre op appt with c/o severe back pain. Denies leakage of fluid. Call to MD office Renaldo Fiddler), pt to come to office to be checked per triage nurse, Celine Mans. Pt instructed.

## 2011-09-15 ENCOUNTER — Encounter (HOSPITAL_COMMUNITY): Payer: Self-pay | Admitting: *Deleted

## 2011-09-15 ENCOUNTER — Encounter (HOSPITAL_COMMUNITY)
Admission: RE | Admit: 2011-09-15 | Discharge: 2011-09-15 | Disposition: A | Discharge status: Home / Self Care | Payer: BC Managed Care – PPO | Source: Ambulatory Visit | Attending: Obstetrics and Gynecology | Admitting: Obstetrics and Gynecology

## 2011-09-15 ENCOUNTER — Encounter (HOSPITAL_COMMUNITY): Payer: Self-pay

## 2011-09-15 ENCOUNTER — Inpatient Hospital Stay (HOSPITAL_COMMUNITY)
Admission: AD | Admit: 2011-09-15 | Discharge: 2011-09-15 | Disposition: A | Discharge status: Home / Self Care | Payer: BC Managed Care – PPO | Source: Ambulatory Visit | Attending: Obstetrics and Gynecology | Admitting: Obstetrics and Gynecology

## 2011-09-15 DIAGNOSIS — O479 False labor, unspecified: Secondary | ICD-10-CM | POA: Insufficient documentation

## 2011-09-15 LAB — CBC
HCT: 39.1 % (ref 36.0–46.0)
MCH: 31.8 pg (ref 26.0–34.0)
MCHC: 33.8 g/dL (ref 30.0–36.0)
MCV: 94.2 fL (ref 78.0–100.0)
Platelets: 322 10*3/uL (ref 150–400)
RDW: 12.8 % (ref 11.5–15.5)
WBC: 15.1 10*3/uL — ABNORMAL HIGH (ref 4.0–10.5)

## 2011-09-15 LAB — SURGICAL PCR SCREEN: MRSA, PCR: NEGATIVE

## 2011-09-15 NOTE — MAU Note (Signed)
Contractions started about 45 mins. ago. Went to BR and saw spot of blood and mucous. States baby is breech and she is scheduled for C/S on 8/30.

## 2011-09-15 NOTE — Patient Instructions (Addendum)
[name]  09/15/2011   Your procedure is scheduled on:  09/19/11  Enter through the Main Entrance of [facility] at 6 AM.  Pick up the phone at the desk and dial [phone].   Call this number if you have problems the morning of surgery: [phone]   Remember:   Do not eat food:After Midnight.  Do not drink clear liquids: After Midnight.  Take these medicines the morning of surgery with A SIP OF WATER: Thyroid medication    Do not wear jewelry, make-up or nail polish.  Do not wear lotions, powders, or perfumes. You may wear deodorant.  Do not shave 48 hours prior to surgery.  Do not bring valuables to the hospital.  Contacts, dentures or bridgework may not be worn into surgery.  Leave suitcase in the car. After surgery it may be brought to your room.  For patients admitted to the hospital, checkout time is 11:00 AM the day of discharge.   Patients discharged the day of surgery will not be allowed to drive home.  Name and phone number of your driver: NA  Special Instructions: CHG Shower Use Special Wash: 1/2 bottle night before surgery and 1/2 bottle morning of surgery.   Please read over the following fact sheets that you were given: MRSA Information

## 2011-09-17 NOTE — H&P (Addendum)
36 yo G2P1 @ 39+1 wks presents for repeat c-section.  Infant w/ suspected VSD, to be further evaluated after delivery  Past History - depression, hypothyroidism PSHx:  Gastric lapband, ovarian cystectomy, c-section,  All:  Codeine,paxil Meds:  PNV SHx:  Smoker   AF, VSS 118/72 Gen - NAD Abd - gravid, NT Ext 2+ edema bilaterally Cvx closed  A/P:  Prior c-section, desires repeat Suspected VSD - peds to eval after delivery Plan of care reviewed R/b/a discussed, informed consent

## 2011-09-18 ENCOUNTER — Encounter (HOSPITAL_COMMUNITY): Payer: Self-pay | Admitting: Anesthesiology

## 2011-09-18 MED ORDER — DEXTROSE 5 % IV SOLN
3.0000 g | INTRAVENOUS | Status: AC
  Administered 2011-09-19: 3 g via INTRAVENOUS
  Filled 2011-09-18: qty 3000

## 2011-09-18 NOTE — Anesthesia Preprocedure Evaluation (Addendum)
Anesthesia Evaluation  Patient identified by MRN, date of birth, ID band Patient awake    Reviewed: Allergy & Precautions, H&P , NPO status , Patient's Chart, lab work & pertinent test results  Airway Mallampati: I TM Distance: >3 FB Neck ROM: Full    Dental No notable dental hx. (+) Chipped,    Pulmonary sleep apnea , Current Smoker,  breath sounds clear to auscultation  Pulmonary exam normal       Cardiovascular negative cardio ROS  Rhythm:Regular Rate:Normal     Neuro/Psych Anxiety  Neuromuscular disease    GI/Hepatic Neg liver ROS, GERD-  Medicated and Controlled,S/P Lap Gastric Banding   Endo/Other  Hypothyroidism Morbid obesity  Renal/GU negative Renal ROS  negative genitourinary   Musculoskeletal  (+) Fibromyalgia -  Abdominal (+) + obese,   Peds  Hematology negative hematology ROS (+)   Anesthesia Other Findings   Reproductive/Obstetrics (+) Pregnancy Previous C/Section Desires Sterilization Breech Presentation                          Anesthesia Physical Anesthesia Plan  ASA: III  Anesthesia Plan: Spinal   Post-op Pain Management:    Induction:   Airway Management Planned: Natural Airway  Additional Equipment:   Intra-op Plan:   Post-operative Plan:   Informed Consent: I have reviewed the patients History and Physical, chart, labs and discussed the procedure including the risks, benefits and alternatives for the proposed anesthesia with the patient or authorized representative who has indicated his/her understanding and acceptance.   Dental advisory given  Plan Discussed with: CRNA, Anesthesiologist and Surgeon  Anesthesia Plan Comments:         Anesthesia Quick Evaluation

## 2011-09-19 ENCOUNTER — Inpatient Hospital Stay (HOSPITAL_COMMUNITY): Payer: BC Managed Care – PPO | Admitting: Anesthesiology

## 2011-09-19 ENCOUNTER — Encounter (HOSPITAL_COMMUNITY): Payer: Self-pay | Admitting: Anesthesiology

## 2011-09-19 ENCOUNTER — Inpatient Hospital Stay (HOSPITAL_COMMUNITY)
Admission: AD | Admit: 2011-09-19 | Discharge: 2011-09-22 | DRG: 371 | Disposition: A | Discharge status: Home / Self Care | Payer: BC Managed Care – PPO | Source: Ambulatory Visit | Attending: Obstetrics and Gynecology | Admitting: Obstetrics and Gynecology

## 2011-09-19 ENCOUNTER — Encounter (HOSPITAL_COMMUNITY)
Admission: AD | Disposition: A | Discharge status: Home / Self Care | Payer: Self-pay | Source: Ambulatory Visit | Attending: Obstetrics and Gynecology

## 2011-09-19 DIAGNOSIS — E039 Hypothyroidism, unspecified: Secondary | ICD-10-CM | POA: Diagnosis present

## 2011-09-19 DIAGNOSIS — O99284 Endocrine, nutritional and metabolic diseases complicating childbirth: Secondary | ICD-10-CM | POA: Diagnosis present

## 2011-09-19 DIAGNOSIS — Z01818 Encounter for other preprocedural examination: Secondary | ICD-10-CM

## 2011-09-19 DIAGNOSIS — E079 Disorder of thyroid, unspecified: Secondary | ICD-10-CM | POA: Diagnosis present

## 2011-09-19 DIAGNOSIS — Z302 Encounter for sterilization: Secondary | ICD-10-CM

## 2011-09-19 DIAGNOSIS — O34219 Maternal care for unspecified type scar from previous cesarean delivery: Principal | ICD-10-CM | POA: Diagnosis present

## 2011-09-19 DIAGNOSIS — O321XX0 Maternal care for breech presentation, not applicable or unspecified: Secondary | ICD-10-CM | POA: Diagnosis present

## 2011-09-19 DIAGNOSIS — O99844 Bariatric surgery status complicating childbirth: Secondary | ICD-10-CM | POA: Diagnosis present

## 2011-09-19 DIAGNOSIS — Z01812 Encounter for preprocedural laboratory examination: Secondary | ICD-10-CM

## 2011-09-19 SURGERY — Surgical Case
Anesthesia: Spinal | Site: Abdomen | Laterality: Bilateral | Wound class: Clean Contaminated

## 2011-09-19 MED ORDER — HYDROMORPHONE HCL PF 1 MG/ML IJ SOLN
0.5000 mg | Freq: Once | INTRAMUSCULAR | Status: AC
  Administered 2011-09-19: 0.5 mg via INTRAVENOUS
  Filled 2011-09-19: qty 1

## 2011-09-19 MED ORDER — SODIUM CHLORIDE 0.9 % IV SOLN: 1.0000 ug/kg/h | INTRAVENOUS | Status: DC | PRN

## 2011-09-19 MED ORDER — DIPHENHYDRAMINE HCL 25 MG PO CAPS
25.0000 mg | ORAL_CAPSULE | ORAL | Status: DC | PRN
  Administered 2011-09-19: 25 mg via ORAL
  Filled 2011-09-19: qty 1

## 2011-09-19 MED ORDER — SENNOSIDES-DOCUSATE SODIUM 8.6-50 MG PO TABS
2.0000 | ORAL_TABLET | Freq: Every day | ORAL | Status: DC
  Administered 2011-09-19 – 2011-09-21 (×3): 2 via ORAL

## 2011-09-19 MED ORDER — MORPHINE SULFATE 0.5 MG/ML IJ SOLN
INTRAMUSCULAR | Status: AC
  Filled 2011-09-19: qty 10

## 2011-09-19 MED ORDER — KETOROLAC TROMETHAMINE 60 MG/2ML IM SOLN
INTRAMUSCULAR | Status: AC
  Administered 2011-09-19: 60 mg via INTRAMUSCULAR
  Filled 2011-09-19: qty 2

## 2011-09-19 MED ORDER — LEVOTHYROXINE SODIUM 150 MCG PO TABS
150.0000 ug | ORAL_TABLET | Freq: Two times a day (BID) | ORAL | Status: DC
  Administered 2011-09-19 – 2011-09-22 (×6): 150 ug via ORAL
  Filled 2011-09-19 (×6): qty 1

## 2011-09-19 MED ORDER — 0.9 % SODIUM CHLORIDE (POUR BTL) OPTIME
TOPICAL | Status: DC | PRN
  Administered 2011-09-19: 1000 mL

## 2011-09-19 MED ORDER — OXYTOCIN 10 UNIT/ML IJ SOLN
INTRAMUSCULAR | Status: AC
  Filled 2011-09-19: qty 4

## 2011-09-19 MED ORDER — DEXTROSE IN LACTATED RINGERS 5 % IV SOLN
INTRAVENOUS | Status: DC
  Administered 2011-09-19: 15:00:00 via INTRAVENOUS

## 2011-09-19 MED ORDER — OXYTOCIN 10 UNIT/ML IJ SOLN
40.0000 [IU] | INTRAVENOUS | Status: DC | PRN
  Administered 2011-09-19: 40 [IU] via INTRAVENOUS

## 2011-09-19 MED ORDER — LACTATED RINGERS IV SOLN
INTRAVENOUS | Status: DC
  Administered 2011-09-19: 07:00:00 via INTRAVENOUS

## 2011-09-19 MED ORDER — TETANUS-DIPHTH-ACELL PERTUSSIS 5-2.5-18.5 LF-MCG/0.5 IM SUSP: 0.5000 mL | Freq: Once | INTRAMUSCULAR | Status: DC

## 2011-09-19 MED ORDER — MEASLES, MUMPS & RUBELLA VAC ~~LOC~~ INJ: 0.5000 mL | INJECTION | Freq: Once | SUBCUTANEOUS | Status: DC

## 2011-09-19 MED ORDER — NALOXONE HCL 0.4 MG/ML IJ SOLN: 0.4000 mg | INTRAMUSCULAR | Status: DC | PRN

## 2011-09-19 MED ORDER — ONDANSETRON HCL 4 MG/2ML IJ SOLN: 4.0000 mg | Freq: Three times a day (TID) | INTRAMUSCULAR | Status: DC | PRN

## 2011-09-19 MED ORDER — OXYTOCIN 40 UNITS IN LACTATED RINGERS INFUSION - SIMPLE MED: 62.5000 mL/h | INTRAVENOUS | Status: AC

## 2011-09-19 MED ORDER — KETOROLAC TROMETHAMINE 60 MG/2ML IM SOLN
60.0000 mg | Freq: Once | INTRAMUSCULAR | Status: AC | PRN
  Administered 2011-09-19: 60 mg via INTRAMUSCULAR

## 2011-09-19 MED ORDER — SCOPOLAMINE 1 MG/3DAYS TD PT72
1.0000 | MEDICATED_PATCH | Freq: Once | TRANSDERMAL | Status: DC
  Filled 2011-09-19: qty 1

## 2011-09-19 MED ORDER — ONDANSETRON HCL 4 MG/2ML IJ SOLN
INTRAMUSCULAR | Status: AC
  Filled 2011-09-19: qty 2

## 2011-09-19 MED ORDER — HYDROMORPHONE HCL PF 1 MG/ML IJ SOLN: 0.2500 mg | INTRAMUSCULAR | Status: DC | PRN

## 2011-09-19 MED ORDER — MEPERIDINE HCL 25 MG/ML IJ SOLN: 6.2500 mg | INTRAMUSCULAR | Status: DC | PRN

## 2011-09-19 MED ORDER — PHENYLEPHRINE HCL 10 MG/ML IJ SOLN
INTRAMUSCULAR | Status: DC | PRN
  Administered 2011-09-19 (×4): 40 ug via INTRAVENOUS

## 2011-09-19 MED ORDER — SCOPOLAMINE 1 MG/3DAYS TD PT72
MEDICATED_PATCH | TRANSDERMAL | Status: AC
  Administered 2011-09-19: 1.5 mg via TRANSDERMAL
  Filled 2011-09-19: qty 1

## 2011-09-19 MED ORDER — IBUPROFEN 600 MG PO TABS: 600.0000 mg | ORAL_TABLET | Freq: Four times a day (QID) | ORAL | Status: DC | PRN

## 2011-09-19 MED ORDER — KETOROLAC TROMETHAMINE 30 MG/ML IJ SOLN: 30.0000 mg | Freq: Four times a day (QID) | INTRAMUSCULAR | Status: AC | PRN

## 2011-09-19 MED ORDER — DIPHENHYDRAMINE HCL 50 MG/ML IJ SOLN: 25.0000 mg | INTRAMUSCULAR | Status: DC | PRN

## 2011-09-19 MED ORDER — LACTATED RINGERS IV SOLN
INTRAVENOUS | Status: DC | PRN
  Administered 2011-09-19: 08:00:00 via INTRAVENOUS

## 2011-09-19 MED ORDER — ONDANSETRON HCL 4 MG PO TABS: 4.0000 mg | ORAL_TABLET | ORAL | Status: DC | PRN

## 2011-09-19 MED ORDER — FENTANYL CITRATE 0.05 MG/ML IJ SOLN
INTRAMUSCULAR | Status: AC
  Filled 2011-09-19: qty 2

## 2011-09-19 MED ORDER — ONDANSETRON HCL 4 MG/2ML IJ SOLN
INTRAMUSCULAR | Status: DC | PRN
  Administered 2011-09-19: 4 mg via INTRAVENOUS

## 2011-09-19 MED ORDER — SCOPOLAMINE 1 MG/3DAYS TD PT72
1.0000 | MEDICATED_PATCH | TRANSDERMAL | Status: DC
  Administered 2011-09-19: 1.5 mg via TRANSDERMAL

## 2011-09-19 MED ORDER — NALBUPHINE HCL 10 MG/ML IJ SOLN
5.0000 mg | INTRAMUSCULAR | Status: DC | PRN
  Filled 2011-09-19: qty 1

## 2011-09-19 MED ORDER — ONDANSETRON HCL 4 MG/2ML IJ SOLN: 4.0000 mg | INTRAMUSCULAR | Status: DC | PRN

## 2011-09-19 MED ORDER — LANOLIN HYDROUS EX OINT: 1.0000 "application " | TOPICAL_OINTMENT | CUTANEOUS | Status: DC | PRN

## 2011-09-19 MED ORDER — LACTATED RINGERS IV SOLN
INTRAVENOUS | Status: DC | PRN
  Administered 2011-09-19 (×3): via INTRAVENOUS

## 2011-09-19 MED ORDER — SODIUM CHLORIDE 0.9 % IJ SOLN: 3.0000 mL | INTRAMUSCULAR | Status: DC | PRN

## 2011-09-19 MED ORDER — METOCLOPRAMIDE HCL 5 MG/ML IJ SOLN: 10.0000 mg | Freq: Three times a day (TID) | INTRAMUSCULAR | Status: DC | PRN

## 2011-09-19 MED ORDER — DIBUCAINE 1 % RE OINT
1.0000 | TOPICAL_OINTMENT | RECTAL | Status: DC | PRN
Start: 2011-09-19 — End: 2011-09-22

## 2011-09-19 MED ORDER — MENTHOL 3 MG MT LOZG: 1.0000 | LOZENGE | OROMUCOSAL | Status: DC | PRN

## 2011-09-19 MED ORDER — PRENATAL MULTIVITAMIN CH
1.0000 | ORAL_TABLET | Freq: Every day | ORAL | Status: DC
  Administered 2011-09-20 – 2011-09-22 (×3): 1 via ORAL
  Filled 2011-09-19 (×3): qty 1

## 2011-09-19 MED ORDER — SIMETHICONE 80 MG PO CHEW
80.0000 mg | CHEWABLE_TABLET | Freq: Three times a day (TID) | ORAL | Status: DC
  Administered 2011-09-19 – 2011-09-22 (×10): 80 mg via ORAL

## 2011-09-19 MED ORDER — SIMETHICONE 80 MG PO CHEW
80.0000 mg | CHEWABLE_TABLET | ORAL | Status: DC | PRN
  Administered 2011-09-20: 80 mg via ORAL

## 2011-09-19 MED ORDER — PHENYLEPHRINE 40 MCG/ML (10ML) SYRINGE FOR IV PUSH (FOR BLOOD PRESSURE SUPPORT)
PREFILLED_SYRINGE | INTRAVENOUS | Status: AC
  Filled 2011-09-19: qty 5

## 2011-09-19 MED ORDER — HYDROMORPHONE HCL 2 MG PO TABS
2.0000 mg | ORAL_TABLET | ORAL | Status: DC | PRN
  Administered 2011-09-20 – 2011-09-22 (×11): 2 mg via ORAL
  Filled 2011-09-19 (×11): qty 1

## 2011-09-19 MED ORDER — DIPHENHYDRAMINE HCL 50 MG/ML IJ SOLN: 12.5000 mg | INTRAMUSCULAR | Status: DC | PRN

## 2011-09-19 MED ORDER — IBUPROFEN 600 MG PO TABS
600.0000 mg | ORAL_TABLET | Freq: Four times a day (QID) | ORAL | Status: DC
  Administered 2011-09-19 – 2011-09-22 (×12): 600 mg via ORAL
  Filled 2011-09-19 (×11): qty 1

## 2011-09-19 SURGICAL SUPPLY — 31 items
ADH SKN CLS APL DERMABOND .7 (GAUZE/BANDAGES/DRESSINGS) ×1
CHLORAPREP W/TINT 26ML (MISCELLANEOUS) ×2 IMPLANT
CLOTH BEACON ORANGE TIMEOUT ST (SAFETY) ×2 IMPLANT
DERMABOND ADVANCED (GAUZE/BANDAGES/DRESSINGS) ×1
DERMABOND ADVANCED .7 DNX12 (GAUZE/BANDAGES/DRESSINGS) ×1 IMPLANT
DRSG COVADERM 4X10 (GAUZE/BANDAGES/DRESSINGS) ×1 IMPLANT
ELECT REM PT RETURN 9FT ADLT (ELECTROSURGICAL) ×2
ELECTRODE REM PT RTRN 9FT ADLT (ELECTROSURGICAL) ×1 IMPLANT
EXTRACTOR VACUUM M CUP 4 TUBE (SUCTIONS) IMPLANT
GLOVE BIO SURGEON STRL SZ 6.5 (GLOVE) ×2 IMPLANT
GLOVE BIOGEL PI IND STRL 7.0 (GLOVE) ×2 IMPLANT
GLOVE BIOGEL PI INDICATOR 7.0 (GLOVE) ×2
GOWN PREVENTION PLUS LG XLONG (DISPOSABLE) ×6 IMPLANT
KIT ABG SYR 3ML LUER SLIP (SYRINGE) ×2 IMPLANT
NDL HYPO 25X5/8 SAFETYGLIDE (NEEDLE) ×1 IMPLANT
NEEDLE HYPO 25X5/8 SAFETYGLIDE (NEEDLE) ×2 IMPLANT
NS IRRIG 1000ML POUR BTL (IV SOLUTION) ×2 IMPLANT
PACK C SECTION WH (CUSTOM PROCEDURE TRAY) ×2 IMPLANT
PAD OB MATERNITY 4.3X12.25 (PERSONAL CARE ITEMS) IMPLANT
SLEEVE SCD COMPRESS KNEE MED (MISCELLANEOUS) IMPLANT
STAPLER VISISTAT 35W (STAPLE) ×1 IMPLANT
SUT CHROMIC 0 CT 802H (SUTURE) IMPLANT
SUT CHROMIC 0 CTX 36 (SUTURE) ×6 IMPLANT
SUT MON AB-0 CT1 36 (SUTURE) ×2 IMPLANT
SUT PDS AB 0 CTX 60 (SUTURE) ×2 IMPLANT
SUT PLAIN 0 NONE (SUTURE) ×1 IMPLANT
SUT PLAIN 2 0 XLH (SUTURE) ×1 IMPLANT
SUT VIC AB 4-0 KS 27 (SUTURE) IMPLANT
TOWEL OR 17X24 6PK STRL BLUE (TOWEL DISPOSABLE) ×4 IMPLANT
TRAY FOLEY CATH 14FR (SET/KITS/TRAYS/PACK) ×1 IMPLANT
WATER STERILE IRR 1000ML POUR (IV SOLUTION) ×2 IMPLANT

## 2011-09-19 NOTE — Anesthesia Procedure Notes (Signed)
Spinal  Patient location during procedure: OR Preanesthetic Checklist Completed: patient identified, site marked, surgical consent, pre-op evaluation, timeout performed, IV checked, risks and benefits discussed and monitors and equipment checked Spinal Block Patient position: sitting Prep: DuraPrep Patient monitoring: heart rate, cardiac monitor, continuous pulse ox and blood pressure Approach: midline Location: L3-4 Injection technique: single-shot Needle Needle type: Sprotte  Needle gauge: 24 G Needle length: 9 cm Assessment Sensory level: T4 Additional Notes Spinal Dosage in OR  Bupivicaine ml       1.7 PFMS04   mcg        150 Fentanyl mcg            25    

## 2011-09-19 NOTE — Preoperative (Signed)
Beta Blockers   Reason not to administer Beta Blockers:Not Applicable 

## 2011-09-19 NOTE — Transfer of Care (Signed)
Immediate Anesthesia Transfer of Care Note  Patient: Sally [name]  Procedure(s) Performed: Procedure(s) (LRB): CESAREAN SECTION WITH BILATERAL TUBAL LIGATION (Bilateral)  Patient Location: PACU  Anesthesia Type: Spinal  Level of Consciousness: awake, alert  and patient cooperative  Airway & Oxygen Therapy: Patient Spontanous Breathing  Post-op Assessment: Report given to PACU RN and Post -op Vital signs reviewed and stable  Post vital signs: Reviewed  Complications: No apparent anesthesia complications

## 2011-09-19 NOTE — Anesthesia Postprocedure Evaluation (Signed)
  Anesthesia Post-op Note  Patient: Sally [name]  Procedure(s) Performed: Procedure(s) (LRB): CESAREAN SECTION WITH BILATERAL TUBAL LIGATION (Bilateral)   Patient is awake, responsive, moving her legs, and has signs of resolution of her numbness. Pain and nausea are reasonably well controlled. Vital signs are stable and clinically acceptable. Oxygen saturation is clinically acceptable. There are no apparent anesthetic complications at this time. Patient is ready for discharge.

## 2011-09-19 NOTE — Op Note (Signed)
Cesarean Section Procedure Note   Tru Leopard  09/19/2011  Indications: Scheduled Proceedure/Maternal Request   Pre-operative Diagnosis: previous, breech;desire sterilization.   Post-operative Diagnosis: Same   Procedure:  Repeat LTCD & bilateral partial salpingectomy  Surgeon: Surgeon(s) and Role:    * Zelphia Cairo, MD - Primary   Assistants: none  Anesthesia: spinal   Procedure Details:  The patient was seen in the Holding Room. The risks, benefits, complications, treatment options, and expected outcomes were discussed with the patient. The patient concurred with the proposed plan, giving informed consent. identified as Arman Filter and the procedure verified as C-Section Delivery. A Time Out was held and the above information confirmed.  After induction of anesthesia, the patient was draped and prepped in the usual sterile manner. A transverse was made and carried down through the subcutaneous tissue to the fascia. Fascial incision was made and extended transversely. The fascia was separated from the underlying rectus tissue superiorly and inferiorly. The peritoneum was identified and entered. Peritoneal incision was extended longitudinally. The utero-vesical peritoneal reflection was incised transversely and the bladder flap was bluntly freed from the lower uterine segment. A low transverse uterine incision was made. Delivered from breech presentation was a female infant with Apgar scores pending. Cord ph was not sent the umbilical cord was clamped and cut cord blood was obtained for evaluation. The placenta was removed Intact. The uterine outline, tubes and ovaries appeared normal}. The uterine incision was closed with running locked sutures of 0chromic gut.    Bilateral fallopian tubes were grasped with babcock clamps.  A knuckle of both tubes were doubly tied with plain gut suture and the knuckle of tube was excised.  Bilateral segments of fallopian tubes passed off  and  Hemostasis was observed. Lavage was carried out until clear.  Peritoneum closed with monocryl.  The fascia was then reapproximated with running sutures of 0PDS. The subcuticular closure was performed using 2-0plain gut. The skin was closed with staples.   Instrument, sponge, and needle counts were correct prior the abdominal closure and were correct at the conclusion of the case.    Findings:   Estimated Blood Loss: * No blood loss amount entered *    Urine Output: clear  Specimens: placenta  Complications: no complications  Disposition: PACU - hemodynamically stable.   Maternal Condition: stable   Baby condition / location:  Operative room with parents  Attending Attestation: I was present and scrubbed for the entire procedure.   Signed: Surgeon(s): Zelphia Cairo, MD

## 2011-09-20 LAB — CBC
HCT: 31.5 % — ABNORMAL LOW (ref 36.0–46.0)
Hemoglobin: 11 g/dL — ABNORMAL LOW (ref 12.0–15.0)
MCH: 32.8 pg (ref 26.0–34.0)
MCHC: 34.9 g/dL (ref 30.0–36.0)
MCV: 94 fL (ref 78.0–100.0)
Platelets: 264 K/uL (ref 150–400)
RBC: 3.35 MIL/uL — ABNORMAL LOW (ref 3.87–5.11)
RDW: 12.7 % (ref 11.5–15.5)
WBC: 12.8 K/uL — ABNORMAL HIGH (ref 4.0–10.5)

## 2011-09-20 NOTE — Progress Notes (Signed)
Subjective: Postpartum Day 1: Cesarean Delivery Patient reports nausea, vomiting, incisional pain and tolerating PO.    Objective: Vital signs in last 24 hours: Temp:  [97.2 F (36.2 C)-98.5 F (36.9 C)] 97.5 F (36.4 C) (08/31 0752) Pulse Rate:  [57-84] 74  (08/31 0752) Resp:  [16-20] 16  (08/31 0752) BP: (92-138)/(55-80) 127/80 mmHg (08/31 0752) SpO2:  [96 %-99 %] 98 % (08/31 0752)  Physical Exam:  General: alert, cooperative and appears stated age Lochia: appropriate Uterine Fundus: firm Incision: healing well, no significant drainage, no dehiscence, no significant erythema DVT Evaluation: No evidence of DVT seen on physical exam.   Basename 09/20/11 0525  HGB 11.0*  HCT 31.5*    Assessment/Plan: Status post Cesarean section. Doing well postoperatively.  Continue current care.  Legrand Lasser L 09/20/2011, 8:31 AM

## 2011-09-20 NOTE — Anesthesia Postprocedure Evaluation (Signed)
  Anesthesia Post-op Note  Patient: Sally [name]  Procedure(s) Performed: Procedure(s) (LRB): CESAREAN SECTION WITH BILATERAL TUBAL LIGATION (Bilateral)  Patient Location: Mother/Baby  Anesthesia Type: Spinal  Level of Consciousness: awake  Airway and Oxygen Therapy: Patient Spontanous Breathing  Post-op Pain: mild  Post-op Assessment: Patient's Cardiovascular Status Stable and Respiratory Function Stable  Post-op Vital Signs: stable  Complications: No apparent anesthesia complications

## 2011-09-20 NOTE — Addendum Note (Signed)
Addendum  created 09/20/11 1610 by Renford Dills, CRNA   Modules edited:Notes Section

## 2011-09-21 NOTE — Progress Notes (Signed)
Post Partum Day 2 Subjective: no complaints, up ad lib, voiding, tolerating PO and + flatus  Objective: Blood pressure 135/81, pulse 80, temperature 97.8 F (36.6 C), temperature source Oral, resp. rate 20, last menstrual period 12/19/2010, SpO2 98.00%.  Physical Exam:  General: alert, cooperative and appears stated age Lochia: appropriate Uterine Fundus: firm Incision: healing well, no significant drainage, no dehiscence, no significant erythema DVT Evaluation: No evidence of DVT seen on physical exam.   Basename 09/20/11 0525  HGB 11.0*  HCT 31.5*    Assessment/Plan: Plan for discharge tomorrow   LOS: 2 days   Karsyn Rochin L 09/21/2011, 9:04 AM

## 2011-09-22 MED ORDER — IBUPROFEN 600 MG PO TABS: 600.0000 mg | ORAL_TABLET | Freq: Four times a day (QID) | ORAL | Status: AC

## 2011-09-22 MED ORDER — HYDROMORPHONE HCL 2 MG PO TABS: 2.0000 mg | ORAL_TABLET | ORAL | Status: AC | PRN

## 2011-09-22 NOTE — Progress Notes (Signed)
Subjective: Postpartum Day 3: Cesarean Delivery Patient reports tolerating PO, + flatus and no problems voiding.    Objective: Vital signs in last 24 hours: Temp:  [98.3 F (36.8 C)-98.5 F (36.9 C)] 98.5 F (36.9 C) (09/02 0554) Pulse Rate:  [65-98] 65  (09/02 0554) Resp:  [18] 18  (09/02 0554) BP: (112-132)/(73-82) 112/73 mmHg (09/02 0554)  Physical Exam:  General: alert, cooperative and appears stated age Lochia: appropriate Uterine Fundus: firm Incision: healing well, no significant drainage, no dehiscence, no significant erythema DVT Evaluation: No evidence of DVT seen on physical exam. Negative Homan's sign. No cords or calf tenderness. No significant calf/ankle edema.   Basename 09/20/11 0525  HGB 11.0*  HCT 31.5*    Assessment/Plan: Status post Cesarean section. Doing well postoperatively.  Discharge home with standard precautions and return to clinic in 4-6 weeks.  Sally [name] 09/22/2011, 10:00 AM

## 2011-09-22 NOTE — Discharge Summary (Signed)
Obstetric Discharge Summary Reason for Admission: cesarean section Prenatal Procedures: none Intrapartum Procedures: cesarean: low cervical, transverse Postpartum Procedures: none Complications-Operative and Postpartum: none Hemoglobin  Date Value Range Status  09/20/2011 11.0* 12.0 - 15.0 g/dL Final     HCT  Date Value Range Status  09/20/2011 31.5* 36.0 - 46.0 % Final    Physical Exam:  General: alert, cooperative and appears stated age Lochia: appropriate Uterine Fundus: firm Incision: healing well, no significant drainage, no dehiscence, no significant erythema.  Staples removed.  DVT Evaluation: No evidence of DVT seen on physical exam. Negative Homan's sign. No cords or calf tenderness.  Discharge Diagnoses: Term Pregnancy-delivered  Discharge Information: Date: 09/22/2011 Activity: unrestricted and pelvic rest Diet: routine Medications: PNV, Ibuprofen, Colace and Dilaudid Condition: stable Instructions: refer to practice specific booklet Discharge to: home   Newborn Data: Live born female  Birth Weight: 8 lb 1.5 oz (3670 g) APGAR: 9, 9  Home with mother.  Dreden Rivere 09/22/2011, 10:01 AM

## 2011-09-23 LAB — TYPE AND SCREEN
ABO/RH(D): A POS
Unit division: 0
Unit division: 0

## 2011-09-25 ENCOUNTER — Encounter (HOSPITAL_COMMUNITY): Payer: Self-pay | Admitting: *Deleted

## 2011-11-13 ENCOUNTER — Ambulatory Visit (INDEPENDENT_AMBULATORY_CARE_PROVIDER_SITE_OTHER): Payer: BC Managed Care – PPO | Admitting: Physician Assistant

## 2011-11-13 ENCOUNTER — Ambulatory Visit
Admission: RE | Admit: 2011-11-13 | Discharge: 2011-11-13 | Disposition: A | Discharge status: Home / Self Care | Payer: BC Managed Care – PPO | Source: Ambulatory Visit | Attending: Physician Assistant | Admitting: Physician Assistant

## 2011-11-13 ENCOUNTER — Telehealth (INDEPENDENT_AMBULATORY_CARE_PROVIDER_SITE_OTHER): Payer: Self-pay

## 2011-11-13 ENCOUNTER — Encounter (INDEPENDENT_AMBULATORY_CARE_PROVIDER_SITE_OTHER): Payer: Self-pay

## 2011-11-13 DIAGNOSIS — Z9884 Bariatric surgery status: Secondary | ICD-10-CM

## 2011-11-13 NOTE — Progress Notes (Signed)
  HISTORY: Sally [name] is a 36 y.o.female who received an AP-Standard lap-band in April 2008 by Dr. Daphine Deutscher. She comes in having delivered her second child and is ready to get back on track with lap band fills. She had all fluid (6.5 mL) removed at her last visit due to vomiting.  VITAL SIGNS: Filed Vitals:   11/13/11 1054  BP: 154/82  Pulse: 82  Temp: 97.7 F (36.5 C)    PHYSICAL EXAM: Physical exam reveals a very well-appearing 36 y.o.female in no apparent distress Neurologic: Awake, alert, oriented Psych: Bright affect, conversant Respiratory: Breathing even and unlabored. No stridor or wheezing Abdomen: Soft, nontender, nondistended to palpation. Incisions well-healed. No incisional hernias. Port easily palpated. Extremities: Atraumatic, good range of motion.  ASSESMENT: 36 y.o.  female  s/p AP-Standard lap-band.   PLAN: We made multiple attempts to access her port without success. Both Dr. Daphine Deutscher and I tried, including using a pillow behind her back and deep palpation. The needle felt like it was hitting the back of the port instead of the septum. We suspect a port flip. We've scheduled an AP and Lateral xray to evaluate port position. We'll have her return in a week unless there's clear evidence for port flip for which she'd likely need port revision.

## 2011-11-13 NOTE — Telephone Encounter (Signed)
Patient called in wanting results from x ray. I had Dr. Ezzard Standing look at report and told patient the port has not flipped but that the part of the port that gets accessed by the needle is rotated down like its facing her feet. She said she paid a copayment today but didn't get port accessed so when she comes back next week she doesn't want to have to pay another copayment. I told her I would send Mardelle Matte a note and they could discuss that next week.

## 2011-11-13 NOTE — Patient Instructions (Signed)
Obtain your x-ray today. Return next week.

## 2011-11-16 ENCOUNTER — Encounter (INDEPENDENT_AMBULATORY_CARE_PROVIDER_SITE_OTHER): Payer: Self-pay | Admitting: Surgery

## 2011-11-20 ENCOUNTER — Encounter (INDEPENDENT_AMBULATORY_CARE_PROVIDER_SITE_OTHER): Payer: Self-pay | Admitting: Surgery

## 2011-11-20 ENCOUNTER — Ambulatory Visit (INDEPENDENT_AMBULATORY_CARE_PROVIDER_SITE_OTHER): Payer: BC Managed Care – PPO | Admitting: Surgery

## 2011-11-20 ENCOUNTER — Encounter (INDEPENDENT_AMBULATORY_CARE_PROVIDER_SITE_OTHER): Payer: BC Managed Care – PPO

## 2011-11-20 VITALS — BP 118/76 | HR 74 | Temp 98.2°F | Resp 18 | Ht 68.0 in | Wt 275.2 lb

## 2011-11-20 DIAGNOSIS — Z9884 Bariatric surgery status: Secondary | ICD-10-CM

## 2011-11-20 NOTE — Progress Notes (Signed)
Sally [name]' port has flipped where the intake port is inferior. Access this about a centimeter to a centimeter and a half below the incision line angling from below. Added 3 cc to her band after a total of 6-1/2 were taken out when she was pregnant. I think that because it so tilted we'll probably need to revise this and will try go and get this set up. See to set up at [facility] under general anesthesia.

## 2012-01-08 ENCOUNTER — Encounter (HOSPITAL_COMMUNITY): Payer: Self-pay | Admitting: Pharmacy Technician

## 2012-01-08 NOTE — Progress Notes (Signed)
Dr. Daphine Deutscher: when possible, we need orders on Sally [name] surg is 12/31 and pt is coming for preop 12/27 thank you

## 2012-01-12 NOTE — Progress Notes (Signed)
Dr Daphine Deutscher-   NEED PRE OP ORDERS PLEASE-  Pt has PST appt 01/16/12   [facility]

## 2012-01-15 ENCOUNTER — Other Ambulatory Visit (INDEPENDENT_AMBULATORY_CARE_PROVIDER_SITE_OTHER): Payer: Self-pay | Admitting: Surgery

## 2012-01-15 NOTE — Progress Notes (Signed)
Need  MD Order entry in Epic, Presurgical testing appt. To be 01-16-12 0900 AM.

## 2012-01-16 ENCOUNTER — Encounter (HOSPITAL_COMMUNITY): Payer: Self-pay

## 2012-01-16 ENCOUNTER — Encounter (HOSPITAL_COMMUNITY)
Admission: RE | Admit: 2012-01-16 | Discharge: 2012-01-16 | Disposition: A | Discharge status: Home / Self Care | Payer: BC Managed Care – PPO | Source: Ambulatory Visit | Attending: Surgery | Admitting: Surgery

## 2012-01-16 HISTORY — DX: Dorsalgia, unspecified: M54.9

## 2012-01-16 LAB — CBC
HCT: 43.3 % (ref 36.0–46.0)
Hemoglobin: 14.4 g/dL (ref 12.0–15.0)
MCH: 30.6 pg (ref 26.0–34.0)
MCHC: 33.3 g/dL (ref 30.0–36.0)
RBC: 4.71 MIL/uL (ref 3.87–5.11)

## 2012-01-16 NOTE — Patient Instructions (Addendum)
[name]  01/16/2012   Your procedure is scheduled on: 12-31  -2013  Report to [facility] at      0515  AM .  Call this number if you have problems the morning of surgery: [phone]  Or Presurgical Testing [phone](Bassel Gaskill)   Remember: Follow any bowel prep instructions per MD office.(Fleet enema night before surgery preop) For Cpap use: Bring mask and tubing only.   Do not eat food:After Midnight.    Take these medicines the morning of surgery with A SIP OF WATER: Lexapro. Synthroid. Ultram. Tylenol.   Do not wear jewelry, make-up or nail polish.  Do not wear lotions, powders, or perfumes. You may wear deodorant.  Do not shave 48 hours prior to surgery.(face and neck okay, no shaving of legs)  Do not bring valuables to the hospital.  Contacts, dentures or bridgework,body piercing,  may not be worn into surgery.  Leave suitcase in the car. After surgery it may be brought to your room.  For patients admitted to the hospital, checkout time is 11:00 AM the day of discharge. 12-1  Patients discharged the day of surgery will not be allowed to drive home. Must have responsible person with you x 24 hours once discharged.  Name and phone number of your driver [name], spouse [phone] cell:   Special Instructions: CHG Shower Use Special Wash: see special instructions.(avoid face and genitals)   Please read over the following fact sheets that you were given: MRSA Information.    Failure to follow these instructions may result in Cancellation of your surgery.   Patient signature_______________________________________________________

## 2012-01-20 ENCOUNTER — Ambulatory Visit (HOSPITAL_COMMUNITY)
Admission: RE | Admit: 2012-01-20 | Discharge: 2012-01-20 | Disposition: A | Discharge status: Home / Self Care | Payer: BC Managed Care – PPO | Source: Ambulatory Visit | Attending: Surgery | Admitting: Surgery

## 2012-01-20 ENCOUNTER — Ambulatory Visit (HOSPITAL_COMMUNITY): Payer: BC Managed Care – PPO | Admitting: Anesthesiology

## 2012-01-20 ENCOUNTER — Encounter (HOSPITAL_COMMUNITY): Payer: Self-pay | Admitting: *Deleted

## 2012-01-20 ENCOUNTER — Encounter (HOSPITAL_COMMUNITY): Payer: Self-pay | Admitting: Anesthesiology

## 2012-01-20 ENCOUNTER — Encounter (HOSPITAL_COMMUNITY)
Admission: RE | Disposition: A | Discharge status: Home / Self Care | Payer: Self-pay | Source: Ambulatory Visit | Attending: Surgery

## 2012-01-20 DIAGNOSIS — IMO0001 Reserved for inherently not codable concepts without codable children: Secondary | ICD-10-CM | POA: Insufficient documentation

## 2012-01-20 DIAGNOSIS — F17200 Nicotine dependence, unspecified, uncomplicated: Secondary | ICD-10-CM | POA: Insufficient documentation

## 2012-01-20 DIAGNOSIS — T85898A Other specified complication of other internal prosthetic devices, implants and grafts, initial encounter: Secondary | ICD-10-CM

## 2012-01-20 DIAGNOSIS — G4730 Sleep apnea, unspecified: Secondary | ICD-10-CM | POA: Insufficient documentation

## 2012-01-20 DIAGNOSIS — E039 Hypothyroidism, unspecified: Secondary | ICD-10-CM | POA: Insufficient documentation

## 2012-01-20 DIAGNOSIS — Y831 Surgical operation with implant of artificial internal device as the cause of abnormal reaction of the patient, or of later complication, without mention of misadventure at the time of the procedure: Secondary | ICD-10-CM | POA: Insufficient documentation

## 2012-01-20 DIAGNOSIS — K9509 Other complications of gastric band procedure: Secondary | ICD-10-CM | POA: Insufficient documentation

## 2012-01-20 DIAGNOSIS — Z01812 Encounter for preprocedural laboratory examination: Secondary | ICD-10-CM | POA: Insufficient documentation

## 2012-01-20 HISTORY — PX: GASTRIC BANDING PORT REVISION: SHX5246

## 2012-01-20 SURGERY — GASTRIC BANDING PORT REVISION
Anesthesia: General | Site: Abdomen | Wound class: Clean

## 2012-01-20 MED ORDER — METOCLOPRAMIDE HCL 5 MG/ML IJ SOLN
INTRAMUSCULAR | Status: DC | PRN
  Administered 2012-01-20: 10 mg via INTRAVENOUS

## 2012-01-20 MED ORDER — PROPOFOL 10 MG/ML IV BOLUS
INTRAVENOUS | Status: DC | PRN
  Administered 2012-01-20: 200 mg via INTRAVENOUS

## 2012-01-20 MED ORDER — ACETAMINOPHEN 10 MG/ML IV SOLN
INTRAVENOUS | Status: AC
  Filled 2012-01-20: qty 100

## 2012-01-20 MED ORDER — CHLORHEXIDINE GLUCONATE 4 % EX LIQD
1.0000 "application " | Freq: Once | CUTANEOUS | Status: DC
  Filled 2012-01-20: qty 15

## 2012-01-20 MED ORDER — MIDAZOLAM HCL 5 MG/5ML IJ SOLN
INTRAMUSCULAR | Status: DC | PRN
  Administered 2012-01-20: 2 mg via INTRAVENOUS

## 2012-01-20 MED ORDER — MEPERIDINE HCL 50 MG/ML IJ SOLN: 6.2500 mg | INTRAMUSCULAR | Status: DC | PRN

## 2012-01-20 MED ORDER — KETAMINE HCL 10 MG/ML IJ SOLN
INTRAMUSCULAR | Status: DC | PRN
  Administered 2012-01-20: 1 mg via INTRAVENOUS
  Administered 2012-01-20 (×2): 2 mg via INTRAVENOUS
  Administered 2012-01-20: 25 mg via INTRAVENOUS

## 2012-01-20 MED ORDER — DEXTROSE 5 % IV SOLN
2.0000 g | INTRAVENOUS | Status: AC
  Administered 2012-01-20: 2 g via INTRAVENOUS
  Filled 2012-01-20: qty 2

## 2012-01-20 MED ORDER — HEPARIN SODIUM (PORCINE) 5000 UNIT/ML IJ SOLN
5000.0000 [IU] | Freq: Once | INTRAMUSCULAR | Status: AC
  Administered 2012-01-20: 5000 [IU] via SUBCUTANEOUS
  Filled 2012-01-20: qty 1

## 2012-01-20 MED ORDER — LACTATED RINGERS IV SOLN
INTRAVENOUS | Status: DC | PRN
  Administered 2012-01-20 (×2): via INTRAVENOUS

## 2012-01-20 MED ORDER — BUPIVACAINE LIPOSOME 1.3 % IJ SUSP
20.0000 mL | Freq: Once | INTRAMUSCULAR | Status: DC
  Filled 2012-01-20: qty 20

## 2012-01-20 MED ORDER — SUCCINYLCHOLINE CHLORIDE 20 MG/ML IJ SOLN
INTRAMUSCULAR | Status: DC | PRN
  Administered 2012-01-20: 100 mg via INTRAVENOUS

## 2012-01-20 MED ORDER — LACTATED RINGERS IV SOLN: INTRAVENOUS | Status: DC

## 2012-01-20 MED ORDER — SODIUM CHLORIDE 0.9 % IJ SOLN
INTRAMUSCULAR | Status: DC | PRN
  Administered 2012-01-20: 50 mL via INTRAVENOUS

## 2012-01-20 MED ORDER — HYDROMORPHONE HCL PF 1 MG/ML IJ SOLN: 0.2500 mg | INTRAMUSCULAR | Status: DC | PRN

## 2012-01-20 MED ORDER — PROMETHAZINE HCL 25 MG/ML IJ SOLN: 6.2500 mg | INTRAMUSCULAR | Status: DC | PRN

## 2012-01-20 MED ORDER — CEFOXITIN SODIUM-DEXTROSE 1-4 GM-% IV SOLR (PREMIX)
INTRAVENOUS | Status: AC
  Filled 2012-01-20: qty 100

## 2012-01-20 MED ORDER — ACETAMINOPHEN 10 MG/ML IV SOLN
INTRAVENOUS | Status: DC | PRN
  Administered 2012-01-20: 1000 mg via INTRAVENOUS

## 2012-01-20 MED ORDER — OXYCODONE HCL 5 MG PO TABS: 5.0000 mg | ORAL_TABLET | ORAL | Status: DC | PRN

## 2012-01-20 MED ORDER — TRAMADOL HCL 50 MG PO TABS
50.0000 mg | ORAL_TABLET | Freq: Once | ORAL | Status: AC
  Administered 2012-01-20: 50 mg via ORAL
  Filled 2012-01-20: qty 1

## 2012-01-20 MED ORDER — FENTANYL CITRATE 0.05 MG/ML IJ SOLN
INTRAMUSCULAR | Status: DC | PRN
  Administered 2012-01-20: 100 ug via INTRAVENOUS

## 2012-01-20 MED ORDER — DEXAMETHASONE SODIUM PHOSPHATE 4 MG/ML IJ SOLN
INTRAMUSCULAR | Status: DC | PRN
  Administered 2012-01-20: 10 mg via INTRAVENOUS

## 2012-01-20 MED ORDER — ONDANSETRON HCL 4 MG/2ML IJ SOLN
INTRAMUSCULAR | Status: DC | PRN
  Administered 2012-01-20: 4 mg via INTRAVENOUS

## 2012-01-20 SURGICAL SUPPLY — 32 items
ACC PORT GSTRC BAND STD KT HI (Band) ×1 IMPLANT
ADH SKN CLS APL DERMABOND .7 (GAUZE/BANDAGES/DRESSINGS) ×1
APL SKNCLS STERI-STRIP NONHPOA (GAUZE/BANDAGES/DRESSINGS)
BENZOIN TINCTURE PRP APPL 2/3 (GAUZE/BANDAGES/DRESSINGS) IMPLANT
BLADE HEX COATED 2.75 (ELECTRODE) ×2 IMPLANT
CANISTER SUCTION 2500CC (MISCELLANEOUS) ×2 IMPLANT
CLOTH BEACON ORANGE TIMEOUT ST (SAFETY) ×2 IMPLANT
DECANTER SPIKE VIAL GLASS SM (MISCELLANEOUS) ×4 IMPLANT
DERMABOND ADVANCED (GAUZE/BANDAGES/DRESSINGS) ×1
DERMABOND ADVANCED .7 DNX12 (GAUZE/BANDAGES/DRESSINGS) IMPLANT
DRAPE LAPAROSCOPIC ABDOMINAL (DRAPES) ×2 IMPLANT
ELECT REM PT RETURN 9FT ADLT (ELECTROSURGICAL) ×2
ELECTRODE REM PT RTRN 9FT ADLT (ELECTROSURGICAL) ×1 IMPLANT
GLOVE BIOGEL M 8.0 STRL (GLOVE) ×2 IMPLANT
GOWN STRL NON-REIN LRG LVL3 (GOWN DISPOSABLE) ×3 IMPLANT
GOWN STRL REIN XL XLG (GOWN DISPOSABLE) ×4 IMPLANT
KIT ACCESS PORT VG (Band) ×1 IMPLANT
KIT BASIN OR (CUSTOM PROCEDURE TRAY) ×2 IMPLANT
MESH HERNIA 1X4 RECT BARD (Mesh General) IMPLANT
MESH HERNIA BARD 1X4 (Mesh General) ×1 IMPLANT
NEEDLE HYPO 22GX1.5 SAFETY (NEEDLE) ×2 IMPLANT
NS IRRIG 1000ML POUR BTL (IV SOLUTION) ×2 IMPLANT
PACK GENERAL/GYN (CUSTOM PROCEDURE TRAY) ×2 IMPLANT
STAPLER VISISTAT 35W (STAPLE) ×1 IMPLANT
STRIP CLOSURE SKIN 1/2X4 (GAUZE/BANDAGES/DRESSINGS) IMPLANT
SUT PROLENE 2 0 CT2 30 (SUTURE) ×8 IMPLANT
SUT VIC AB 2-0 SH 27 (SUTURE)
SUT VIC AB 2-0 SH 27X BRD (SUTURE) IMPLANT
SUT VIC AB 4-0 SH 18 (SUTURE) ×2 IMPLANT
SYR 30ML LL (SYRINGE) ×2 IMPLANT
SYR BULB IRRIGATION 50ML (SYRINGE) IMPLANT
SYRINGE 10CC LL (SYRINGE) ×2 IMPLANT

## 2012-01-20 NOTE — Preoperative (Signed)
Beta Blockers   Reason not to administer Beta Blockers:Not Applicable, not on home bb 

## 2012-01-20 NOTE — H&P (Signed)
Chief Complaint:  Cannot access lapband port  History of Present Illness:  Sally [name] is an 36 y.o. female who had a Lapband APS in April 2008 has had a turned port after her pregnancy with inability to fill it adequately.  For port revieiwon  Past Medical History  Diagnosis Date  . Hypothyroidism   . Anxiety   . Fibromyalgia   . Sleep apnea     pre lap banding-never used cpap or mask  . Back pain     Occ. issues wih back pain    Past Surgical History  Procedure Date  . Cesarean section   . Laparoscopic gastric banding 1997  . Knee surgery   . Laparoscopy 08/19/2010    Procedure: LAPAROSCOPY OPERATIVE;  Surgeon: Lum Keas;  Location: [facility];  Service: Gynecology;  Laterality: N/A;  with Biopsy of uterine serosa  . Brain surgery 07/1989    blood clot removed due to fx skull  . Skull fracture elevation     Surgery to remove blood clot, 1992  . Cesarean section     x2-  '97/ 09-19-11 last  . Tubal ligation     8'13    Current Facility-Administered Medications  Medication Dose Route Frequency Provider Last Rate Last Dose  . cefOXitin (MEFOXIN) 2 g in dextrose 5 % 50 mL IVPB  2 g Intravenous On Call to OR Valarie Merino, MD      . chlorhexidine (HIBICLENS) 4 % liquid 1 application  1 application Topical Once Valarie Merino, MD       Paroxetine hcl; Percocet; and Codeine Family History  Problem Relation Age of Onset  . Hypertension Maternal Grandfather   . Diabetes Paternal Grandmother   . Anesthesia problems Neg Hx    Social History:   reports that she has been smoking Cigarettes.  She has been smoking about .25 packs per day. She has never used smokeless tobacco. She reports that she drinks alcohol. She reports that she does not use illicit drugs.   REVIEW OF SYSTEMS - PERTINENT POSITIVES ONLY: Not relevant to present procedure  Physical Exam:   Blood pressure 141/88, pulse 96, temperature 98.2 F (36.8 C), temperature source Oral, resp. rate 18, height  5\' 7"  (1.702 m), weight 280 lb 8 oz (127.234 kg), last menstrual period 12/29/2011, SpO2 99.00%, not currently breastfeeding. Body mass index is 43.93 kg/(m^2).  Gen:  WDWN female NAD  Neurological: Alert and oriented to person, place, and time. Motor and sensory function is grossly intact  Head: Normocephalic and atraumatic.  Eyes: Conjunctivae are normal. Pupils are equal, round, and reactive to light. No scleral icterus.  Neck: Normal range of motion. Neck supple. No tracheal deviation or thyromegaly present.  Cardiovascular:  SR without murmurs or gallops.  No carotid bruits Respiratory: Effort normal.  No respiratory distress. No chest wall tenderness. Breath sounds normal.  No wheezes, rales or rhonchi.  Abdomen:  Palpable port that has twisted GU: Musculoskeletal: Normal range of motion. Extremities are nontender. No cyanosis, edema or clubbing noted Lymphadenopathy: No cervical, preauricular, postauricular or axillary adenopathy is present Skin: Skin is warm and dry. No rash noted. No diaphoresis. No erythema. No pallor. Pscyh: Normal mood and affect. Behavior is normal. Judgment and thought content normal.   LABORATORY RESULTS: No results found for this or any previous visit (from the past 48 hour(s)).  RADIOLOGY RESULTS: No results found.  Problem List: Patient Active Problem List  Diagnosis  . Pelvic pain in female  .  Lapband APS April 2008    Assessment & Plan: Unable to access lapband port.  For port revision    Matt B. Daphine Deutscher, MD, [facility] [phone] beeper [phone]  01/20/2012 6:45 AM

## 2012-01-20 NOTE — Op Note (Signed)
Surgeon: Wenda Low, MD, FACS  Asst:  none  Anes:  general  Procedure: Replacement of lapband port  Diagnosis: 90 degree rotation of lapband port  Complications: none  EBL:   none cc  Description of Procedure:  Port had twisted 90 degrees.  Explanted and 2.8 cc were in the band.  A new port was placed and 3.3 cc were placed in it.  It was placed in a subcutaneous pocket lateral to the incision.  Mesh was placed on the back.  4-0 Vicryl to close the pocket and Dermabond was used to seal the incision.    Sally B. Daphine Deutscher, MD, [facility], [place] [phone]

## 2012-01-20 NOTE — Anesthesia Postprocedure Evaluation (Signed)
  Anesthesia Post-op Note  Patient: Sally [name]  Procedure(s) Performed: Procedure(s) (LRB): GASTRIC BANDING PORT REVISION (N/A)  Patient Location: PACU  Anesthesia Type: General  Level of Consciousness: awake and alert   Airway and Oxygen Therapy: Patient Spontanous Breathing  Post-op Pain: mild  Post-op Assessment: Post-op Vital signs reviewed, Patient's Cardiovascular Status Stable, Respiratory Function Stable, Patent Airway and No signs of Nausea or vomiting  Last Vitals:  Filed Vitals:   01/20/12 1105  BP: 140/83  Pulse: 88  Temp:   Resp: 18    Post-op Vital Signs: stable   Complications: No apparent anesthesia complications

## 2012-01-20 NOTE — Transfer of Care (Signed)
Immediate Anesthesia Transfer of Care Note  Patient: Sally [name]  Procedure(s) Performed: Procedure(s) (LRB) with comments: GASTRIC BANDING PORT REVISION (N/A)  Patient Location: PACU  Anesthesia Type:General  Level of Consciousness: awake, alert , oriented, patient cooperative and responds to stimulation  Airway & Oxygen Therapy: Patient Spontanous Breathing and Patient connected to nasal cannula oxygen  Post-op Assessment: Report given to PACU RN, Post -op Vital signs reviewed and stable and Patient moving all extremities X 4  Post vital signs: Reviewed and stable  Complications: No apparent anesthesia complications

## 2012-01-20 NOTE — Anesthesia Preprocedure Evaluation (Signed)
Anesthesia Evaluation  Patient identified by MRN, date of birth, ID band Patient awake    Reviewed: Allergy & Precautions, H&P , NPO status , Patient's Chart, lab work & pertinent test results  Airway Mallampati: I TM Distance: >3 FB Neck ROM: Full    Dental No notable dental hx. (+) Chipped,    Pulmonary sleep apnea , Current Smoker,  breath sounds clear to auscultation  Pulmonary exam normal       Cardiovascular negative cardio ROS  Rhythm:Regular Rate:Normal     Neuro/Psych Anxiety    GI/Hepatic Neg liver ROS, GERD-  Medicated and Controlled,S/P Lap Gastric Banding   Endo/Other  Hypothyroidism Morbid obesity  Renal/GU negative Renal ROS  negative genitourinary   Musculoskeletal  (+) Fibromyalgia -  Abdominal (+) + obese,   Peds  Hematology negative hematology ROS (+)   Anesthesia Other Findings   Reproductive/Obstetrics                           Anesthesia Physical  Anesthesia Plan  ASA: III  Anesthesia Plan: General   Post-op Pain Management:    Induction: Intravenous  Airway Management Planned: Oral ETT  Additional Equipment:   Intra-op Plan:   Post-operative Plan:   Informed Consent: I have reviewed the patients History and Physical, chart, labs and discussed the procedure including the risks, benefits and alternatives for the proposed anesthesia with the patient or authorized representative who has indicated his/her understanding and acceptance.   Dental advisory given  Plan Discussed with: CRNA, Anesthesiologist and Surgeon  Anesthesia Plan Comments:         Anesthesia Quick Evaluation

## 2012-01-22 ENCOUNTER — Ambulatory Visit: Payer: BC Managed Care – PPO

## 2012-01-22 ENCOUNTER — Telehealth (INDEPENDENT_AMBULATORY_CARE_PROVIDER_SITE_OTHER): Payer: Self-pay | Admitting: General Surgery

## 2012-01-22 ENCOUNTER — Encounter (HOSPITAL_COMMUNITY): Payer: Self-pay | Admitting: Surgery

## 2012-01-22 ENCOUNTER — Telehealth (INDEPENDENT_AMBULATORY_CARE_PROVIDER_SITE_OTHER): Payer: Self-pay

## 2012-01-22 NOTE — Telephone Encounter (Signed)
Patient called this morning and just had surgery by Dr Daphine Deutscher on 12-31. She needs an apt for a follow up and she also stated that she is having neck pain. Please call her on her cell

## 2012-01-23 NOTE — Telephone Encounter (Signed)
Called pt to tell her her po appt.  She stated that she is going to make an appt with her PCP about her throat issues.

## 2012-02-13 ENCOUNTER — Encounter (INDEPENDENT_AMBULATORY_CARE_PROVIDER_SITE_OTHER): Payer: BC Managed Care – PPO | Admitting: Surgery

## 2012-03-18 ENCOUNTER — Encounter (INDEPENDENT_AMBULATORY_CARE_PROVIDER_SITE_OTHER): Payer: Self-pay

## 2012-03-18 ENCOUNTER — Ambulatory Visit (INDEPENDENT_AMBULATORY_CARE_PROVIDER_SITE_OTHER): Payer: BC Managed Care – PPO | Admitting: Physician Assistant

## 2012-03-18 VITALS — BP 126/64 | HR 84 | Temp 97.7°F | Resp 16 | Ht 67.0 in | Wt 291.4 lb

## 2012-03-18 DIAGNOSIS — Z4651 Encounter for fitting and adjustment of gastric lap band: Secondary | ICD-10-CM

## 2012-03-18 NOTE — Patient Instructions (Signed)
Take clear liquids tonight. Thin protein shakes are ok to start tomorrow morning. Slowly advance your diet thereafter. Call us if you have persistent vomiting or regurgitation, night cough or reflux symptoms. Return as scheduled or sooner if you notice no changes in hunger/portion sizes.  

## 2012-03-18 NOTE — Progress Notes (Signed)
  HISTORY: Sally [name] is a 37 y.o.female who received an AP-Standard lap-band in April 2008 by Dr. Daphine Deutscher with port revision in December 2013. She comes in with persistent hunger and weight gain but no regurgitation or reflux. She wants a fill today.  VITAL SIGNS: Filed Vitals:   03/18/12 1557  BP: 126/64  Pulse: 84  Temp: 97.7 F (36.5 C)  Resp: 16    PHYSICAL EXAM: Physical exam reveals a very well-appearing 37 y.o.female in no apparent distress Neurologic: Awake, alert, oriented Psych: Bright affect, conversant Respiratory: Breathing even and unlabored. No stridor or wheezing Abdomen: Soft, nontender, nondistended to palpation. Incisions well-healed. No incisional hernias. Port easily palpated. Extremities: Atraumatic, good range of motion.  ASSESMENT: 37 y.o.  female  s/p AP-Standard lap-band.   PLAN: The patient's port was accessed with a 20G Huber needle without difficulty. Clear fluid was aspirated and 2.2 mL saline was added to the port to give a total predicted volume of 5.5 mL. The patient was able to swallow water without difficulty following the procedure and was instructed to take clear liquids for the next 24-48 hours and advance slowly as tolerated.

## 2012-04-15 ENCOUNTER — Encounter (INDEPENDENT_AMBULATORY_CARE_PROVIDER_SITE_OTHER): Payer: BC Managed Care – PPO

## 2012-05-13 ENCOUNTER — Encounter (INDEPENDENT_AMBULATORY_CARE_PROVIDER_SITE_OTHER): Payer: BC Managed Care – PPO

## 2012-05-20 ENCOUNTER — Encounter (INDEPENDENT_AMBULATORY_CARE_PROVIDER_SITE_OTHER): Payer: BC Managed Care – PPO

## 2012-06-03 ENCOUNTER — Encounter (INDEPENDENT_AMBULATORY_CARE_PROVIDER_SITE_OTHER): Payer: BC Managed Care – PPO

## 2012-06-16 ENCOUNTER — Telehealth: Payer: Self-pay | Admitting: Obstetrics & Gynecology

## 2012-06-16 NOTE — Telephone Encounter (Signed)
pt is having pain with intercourse

## 2012-06-16 NOTE — Telephone Encounter (Signed)
APPOINTMENT GIVEN TO PATIENT FOR DR. MILLER. LAST AEX 2012.

## 2012-06-18 ENCOUNTER — Ambulatory Visit (INDEPENDENT_AMBULATORY_CARE_PROVIDER_SITE_OTHER): Payer: BC Managed Care – PPO | Admitting: Obstetrics & Gynecology

## 2012-06-18 ENCOUNTER — Encounter: Payer: Self-pay | Admitting: Obstetrics & Gynecology

## 2012-06-18 VITALS — BP 120/68 | HR 76 | Ht 67.5 in | Wt 271.0 lb

## 2012-06-18 DIAGNOSIS — Z124 Encounter for screening for malignant neoplasm of cervix: Secondary | ICD-10-CM

## 2012-06-18 DIAGNOSIS — R159 Full incontinence of feces: Secondary | ICD-10-CM

## 2012-06-18 DIAGNOSIS — Z01419 Encounter for gynecological examination (general) (routine) without abnormal findings: Secondary | ICD-10-CM

## 2012-06-18 DIAGNOSIS — IMO0002 Reserved for concepts with insufficient information to code with codable children: Secondary | ICD-10-CM

## 2012-06-18 DIAGNOSIS — N930 Postcoital and contact bleeding: Secondary | ICD-10-CM

## 2012-06-18 DIAGNOSIS — Z0000 Encounter for general adult medical examination without abnormal findings: Secondary | ICD-10-CM

## 2012-06-18 LAB — POCT URINALYSIS DIPSTICK
Glucose, UA: NEGATIVE
Ketones, UA: NEGATIVE

## 2012-06-18 NOTE — Patient Instructions (Signed)

## 2012-06-18 NOTE — Progress Notes (Signed)
Patient ID: Sally [name], female   DOB: July 03, 1975, 37 y.o.   MRN: [id number]  37 y.o. [id number] MarriedCaucasianF here for annual exam.  Had son, Aug 30.  Had Cesarean section.  He was 39+ weeks.  9 months.  Perinatally he had small VSD that closed.  Was also followed by MFM.  Had BTL at same time.  Cycles are regular.  Sometimes a little heavier than before but ok.    Reports having a lot of pain with intercourse and some spotting with intercourse as well.  Feeling like it is affecting her sex life.  Has also noted incontinence of stool since delivery.  Not sure why she is having this problem since had Cesarean section.  Did have fluid out of lap band with pregnancy.  Now restarting all of that process.   Normal thyroid since delivery.    Patient's last menstrual period was 06/12/2012.          Sexually active: yes  The current method of family planning is tubal ligation.    Exercising: no  The patient does not participate in regular exercise at present. Smoker:  yes  Health Maintenance: Pap:  Patient unsure if had one during pregnancy.  Last one with me was 11/12. History of abnormal Pap:  Yes.  4/11 LGSIL MMG:  n/a Colonoscopy:  never BMD:   never TDaP:  2013 Screening Labs: PCP, Hb today: 15.2, Urine today: Neg   reports that she has been smoking Cigarettes.  She has been smoking about 0.25 packs per day. She has never used smokeless tobacco. She reports that  drinks alcohol. She reports that she does not use illicit drugs.  Past Medical History  Diagnosis Date  . Hypothyroidism   . Anxiety   . Fibromyalgia   . Sleep apnea     pre lap banding-never used cpap or mask  . Back pain     Occ. issues wih back pain    Past Surgical History  Procedure Laterality Date  . Cesarean section    . Laparoscopic gastric banding  1997  . Knee surgery    . Laparoscopy  08/19/2010    Procedure: LAPAROSCOPY OPERATIVE;  Surgeon: Lum Keas;  Location: [facility];  Service: Gynecology;   Laterality: N/A;  with Biopsy of uterine serosa  . Brain surgery  07/1989    blood clot removed due to fx skull  . Skull fracture elevation      Surgery to remove blood clot, 1992  . Cesarean section      x2-  '97/ 09-19-11 last  . Tubal ligation      8'13  . Gastric banding port revision  01/20/2012    Procedure: GASTRIC BANDING PORT REVISION;  Surgeon: Valarie Merino, MD;  Location: [facility];  Service: General;  Laterality: N/A;    Current Outpatient Prescriptions  Medication Sig Dispense Refill  . acetaminophen (TYLENOL) 500 MG tablet Take 1,000 mg by mouth every 6 (six) hours as needed. Pain      . levothyroxine (SYNTHROID, LEVOTHROID) 150 MCG tablet Take 150 mcg by mouth 2 (two) times daily.      Marland Kitchen tetrahydrozoline-zinc (VISINE-AC) 0.05-0.25 % ophthalmic solution Place 2 drops into both eyes as needed. For allergies      . traMADol (ULTRAM) 50 MG tablet Take 50 mg by mouth every 6 (six) hours as needed.       No current facility-administered medications for this visit.    Family History  Problem Relation  Age of Onset  . Hypertension Maternal Grandfather   . Diabetes Paternal Grandmother   . Anesthesia problems Neg Hx     ROS:  Pertinent items are noted in HPI.  Otherwise, a comprehensive ROS was negative.  Exam:   BP 120/68  Pulse 76  Ht 5' 7.5" (1.715 m)  Wt 271 lb (122.925 kg)  BMI 41.79 kg/m2  LMP 06/12/2012  Breastfeeding? No  Weight change: +40 lbs  Height:   Height: 5' 7.5" (171.5 cm)  Ht Readings from Last 3 Encounters:  06/18/12 5' 7.5" (1.715 m)  03/18/12 5\' 7"  (1.702 m)  01/20/12 5\' 7"  (1.702 m)    General appearance: alert, cooperative and appears stated age Head: Normocephalic, without obvious abnormality, atraumatic Neck: no adenopathy, supple, symmetrical, trachea midline and thyroid normal to inspection and palpation Lungs: clear to auscultation bilaterally Breasts: normal appearance, no masses or tenderness Heart: regular rate and rhythm Abdomen:  soft, non-tender; bowel sounds normal; no masses,  no organomegaly Extremities: extremities normal, atraumatic, no cyanosis or edema Skin: Skin color, texture, turgor normal. No rashes or lesions Lymph nodes: Cervical, supraclavicular, and axillary nodes normal. No abnormal inguinal nodes palpated Neurologic: Grossly normal   Pelvic: External genitalia:  no lesions              Urethra:  normal appearing urethra with no masses, tenderness or lesions              Bartholins and Skenes: normal                 Vagina: normal appearing vagina with normal color and discharge, no lesions              Cervix: no lesions              Pap taken: yes Bimanual Exam:  Uterus:  normal size, contour, position, consistency, mobility, non-tender, anteflexed and mildly tender to manipulation              Adnexa: no mass, fullness, tenderness               Rectovaginal: Confirms               Anus:  normal sphincter tone, no lesions  A:  Well Woman with normal exam S/P Cesarean section 09/19/11 Pelvic floor pain and dyspareunia and fecal incontinence since delivery Spotting with intercourse H/O abnormal Pap Lap band, now having fills for improved weight loss H/O hypothyroidism  P:  pap smear and HR HPV today Referral to Ruben Gottron, [facility], for pelvic PT Sees PCP for thyroid monitoring return annually or prn  An After Visit Summary was printed and given to the patient.

## 2012-06-21 ENCOUNTER — Telehealth: Payer: Self-pay | Admitting: *Deleted

## 2012-06-21 LAB — HEMOGLOBIN, FINGERSTICK: Hemoglobin, fingerstick: 15.2 g/dL (ref 12.0–16.0)

## 2012-06-21 NOTE — Telephone Encounter (Signed)
Patient called to get results of pap test . Explained not in system yet . Will call back tomorrow. Patient also notified of date and time of appt. With Ruben Gottron at [facility] for June 16th @ 2:00pm. Records faxed.

## 2012-06-22 NOTE — Telephone Encounter (Signed)
Left message on CB# of PAP results are not back to our office and might take up to 7 days to get back. To call us if any questions.

## 2012-06-22 NOTE — Telephone Encounter (Signed)
Wants to know her pap results

## 2012-06-24 ENCOUNTER — Encounter (INDEPENDENT_AMBULATORY_CARE_PROVIDER_SITE_OTHER): Payer: BC Managed Care – PPO

## 2012-06-24 ENCOUNTER — Telehealth: Payer: Self-pay | Admitting: Obstetrics & Gynecology

## 2012-06-24 LAB — IPS PAP TEST WITH HPV

## 2012-06-24 NOTE — Telephone Encounter (Signed)
6/5 lmtcb//kn 

## 2012-06-24 NOTE — Telephone Encounter (Signed)
Patient calling for pap result from about a week ago. Patient aware they may not be ready for her yet. Patient requesting a call back as soon as they are please.

## 2012-06-29 NOTE — Addendum Note (Signed)
Addended by: Jerene Bears on: 06/29/2012 08:28 AM   Modules accepted: Orders

## 2012-07-08 ENCOUNTER — Telehealth: Payer: Self-pay | Admitting: Obstetrics & Gynecology

## 2012-07-08 NOTE — Telephone Encounter (Signed)
LMTCB To discuss insurance benefits and to schedule pus.

## 2012-07-09 NOTE — Telephone Encounter (Signed)
Patient wants to speak with nurse about why she needs another ultrasound? Patient is concerned something was abnormal in last labs.

## 2012-07-09 NOTE — Telephone Encounter (Signed)
Spoke with pt who is concerned as to why she needs an [place]. Pt worried something was abnormal on her labwork. Advised pt that her labwork and Pap looked fine. Advised that it appeared SM ordered US to further check post coital bleeding. Pt relieved and agreeable.

## 2012-07-19 ENCOUNTER — Ambulatory Visit (INDEPENDENT_AMBULATORY_CARE_PROVIDER_SITE_OTHER): Payer: BC Managed Care – PPO

## 2012-07-19 ENCOUNTER — Ambulatory Visit (INDEPENDENT_AMBULATORY_CARE_PROVIDER_SITE_OTHER): Payer: BC Managed Care – PPO | Admitting: Obstetrics & Gynecology

## 2012-07-19 DIAGNOSIS — N83209 Unspecified ovarian cyst, unspecified side: Secondary | ICD-10-CM

## 2012-07-19 DIAGNOSIS — N83201 Unspecified ovarian cyst, right side: Secondary | ICD-10-CM

## 2012-07-19 DIAGNOSIS — N930 Postcoital and contact bleeding: Secondary | ICD-10-CM

## 2012-07-19 DIAGNOSIS — IMO0002 Reserved for concepts with insufficient information to code with codable children: Secondary | ICD-10-CM

## 2012-07-19 DIAGNOSIS — N9489 Other specified conditions associated with female genital organs and menstrual cycle: Secondary | ICD-10-CM

## 2012-07-19 DIAGNOSIS — N949 Unspecified condition associated with female genital organs and menstrual cycle: Secondary | ICD-10-CM

## 2012-07-19 DIAGNOSIS — N8300 Follicular cyst of ovary, unspecified side: Secondary | ICD-10-CM

## 2012-07-19 NOTE — Progress Notes (Signed)
37 y.o.Marriedfemale here for a pelvic ultrasound due to dyspareunia that started after her last cesarean section.  When she was here for AEX on 5/30, she described this new problem.  On exam, she has significant pain with palpation along her pelvic floor muscles which may be due to her weight gain in pregnancy.  H/o Lap band and the fluid was removed with her pregnancy.  She had more weight2 gain than she hoped.  She is now working on getting this off.  Pain with intercourse is with insertion and throughout actual act of intercourse.  Of note, her cesarean section was routine and scheduled.  No history of post-operative infection.  Patient's last menstrual period was 07/09/2012.  Sexually active:  yes  Contraception: bilateral tubal ligation  FINDINGS: (report below) UTERUS: 8.3 x 4.8 x 3.8cm EMS: 7.53mm ADNEXA:   Left ovary 2.8 x 2.2 x 1.5cm   Right ovary 3.2 x 2.7 x 2.1cm with 2cm simple cyst CUL DE SAC: negative  Of note, examination of left ovary did seem to reproduce pain for patient.    Results/findings discussed with patient.  She has already been referred to Ruben Gottron for further evaluation and management of her pelvic floor pain.  Patient very tearful today and somewhat frustrated with me that I am not scheduling her immediately for OR procedure.  As ovary looks normal and she is less than a year from prior delivery, I do not feel removal of ovary is appropriate.  We discussed the possibility of post operative scarring but without any post-operative difficulties and what appears to be a fairly straightforward repeat Caesarean section, I do not know why she is having so much pain around her left ovary.  Endometriosis also discussed but with fairly recently delivery, severe endometriosis seems unlikely.  I have agrees to proceed with diagnostic laparoscopy if the pelvic PT does not improve any of her symptoms.   Assessment:  Dyspareunia, h/o 2 prior cesarean sections, h/o lap band with  significant weight gain with pregnancy Plan: Patient will be seeing Ruben Gottron.  If she does not have improvement in her pain, will proceed with diagnostic laparoscopy for further evaluation.  ~25 minutes spent with patient >50% of time was in face to face discussion of above.

## 2012-07-25 ENCOUNTER — Encounter: Payer: Self-pay | Admitting: Obstetrics & Gynecology

## 2012-07-25 NOTE — Patient Instructions (Signed)
I will be back in contact after I receive notes from [facility].

## 2012-08-16 ENCOUNTER — Telehealth: Payer: Self-pay | Admitting: Obstetrics & Gynecology

## 2012-08-16 NOTE — Telephone Encounter (Signed)
Patient did not keep appointment with Ruben Gottron.

## 2012-08-16 NOTE — Telephone Encounter (Signed)
OK to file

## 2012-08-19 ENCOUNTER — Telehealth: Payer: Self-pay | Admitting: Obstetrics & Gynecology

## 2012-08-19 NOTE — Telephone Encounter (Signed)
RE: referral to information on fax sheet is to Sally [name]? Is this the wrong office? Sally [name] is not an employee there? Please call back to clarify.

## 2012-08-26 NOTE — Telephone Encounter (Signed)
All info faxed to [facility] office again. Several forms they were unable to read.

## 2012-09-09 ENCOUNTER — Observation Stay (HOSPITAL_COMMUNITY)
Admission: EM | Admit: 2012-09-09 | Discharge: 2012-09-10 | Disposition: A | Discharge status: Home / Self Care | Payer: BC Managed Care – PPO | Attending: Surgery | Admitting: Surgery

## 2012-09-09 ENCOUNTER — Emergency Department (HOSPITAL_COMMUNITY): Payer: BC Managed Care – PPO

## 2012-09-09 ENCOUNTER — Encounter (HOSPITAL_COMMUNITY): Payer: Self-pay | Admitting: *Deleted

## 2012-09-09 ENCOUNTER — Encounter (HOSPITAL_COMMUNITY)
Admission: EM | Disposition: A | Discharge status: Home / Self Care | Payer: Self-pay | Source: Home / Self Care | Attending: Emergency Medicine

## 2012-09-09 ENCOUNTER — Encounter (HOSPITAL_COMMUNITY): Payer: Self-pay | Admitting: Anesthesiology

## 2012-09-09 ENCOUNTER — Observation Stay (HOSPITAL_COMMUNITY): Payer: BC Managed Care – PPO | Admitting: Anesthesiology

## 2012-09-09 DIAGNOSIS — R1031 Right lower quadrant pain: Secondary | ICD-10-CM | POA: Insufficient documentation

## 2012-09-09 DIAGNOSIS — E039 Hypothyroidism, unspecified: Secondary | ICD-10-CM | POA: Insufficient documentation

## 2012-09-09 DIAGNOSIS — M549 Dorsalgia, unspecified: Secondary | ICD-10-CM

## 2012-09-09 DIAGNOSIS — Z9884 Bariatric surgery status: Secondary | ICD-10-CM | POA: Insufficient documentation

## 2012-09-09 DIAGNOSIS — K3580 Unspecified acute appendicitis: Principal | ICD-10-CM | POA: Insufficient documentation

## 2012-09-09 HISTORY — DX: Hypothyroidism, unspecified: E03.9

## 2012-09-09 HISTORY — DX: Unspecified acute appendicitis: K35.80

## 2012-09-09 HISTORY — DX: Dorsalgia, unspecified: M54.9

## 2012-09-09 HISTORY — PX: LAPAROSCOPIC APPENDECTOMY: SHX408

## 2012-09-09 LAB — COMPREHENSIVE METABOLIC PANEL
Albumin: 3.3 g/dL — ABNORMAL LOW (ref 3.5–5.2)
Alkaline Phosphatase: 78 U/L (ref 39–117)
BUN: 12 mg/dL (ref 6–23)
Chloride: 104 mEq/L (ref 96–112)
GFR calc Af Amer: >90 mL/min (ref >90)
Glucose, Bld: 127 mg/dL — ABNORMAL HIGH (ref 70–99)
Potassium: 4.1 mEq/L (ref 3.5–5.1)
Total Bilirubin: 0.2 mg/dL — ABNORMAL LOW (ref 0.3–1.2)

## 2012-09-09 LAB — CBC WITH DIFFERENTIAL/PLATELET
Hemoglobin: 15.6 g/dL — ABNORMAL HIGH (ref 12.0–15.0)
Lymphocytes Relative: 11 % — ABNORMAL LOW (ref 12–46)
Lymphs Abs: 1.5 10*3/uL (ref 0.7–4.0)
Monocytes Relative: 5 % (ref 3–12)
Neutro Abs: 11.3 10*3/uL — ABNORMAL HIGH (ref 1.7–7.7)
Neutrophils Relative %: 83 % — ABNORMAL HIGH (ref 43–77)
RBC: 4.78 MIL/uL (ref 3.87–5.11)

## 2012-09-09 LAB — WET PREP, GENITAL
WBC, Wet Prep HPF POC: NONE SEEN
Yeast Wet Prep HPF POC: NONE SEEN

## 2012-09-09 LAB — URINALYSIS, ROUTINE W REFLEX MICROSCOPIC
Glucose, UA: NEGATIVE mg/dL
Ketones, ur: NEGATIVE mg/dL
Leukocytes, UA: NEGATIVE
Protein, ur: NEGATIVE mg/dL

## 2012-09-09 LAB — POCT PREGNANCY, URINE: Preg Test, Ur: NEGATIVE

## 2012-09-09 SURGERY — APPENDECTOMY, LAPAROSCOPIC
Anesthesia: General | Site: Abdomen | Wound class: Contaminated

## 2012-09-09 MED ORDER — KCL IN DEXTROSE-NACL 20-5-0.45 MEQ/L-%-% IV SOLN
INTRAVENOUS | Status: DC
  Administered 2012-09-09 – 2012-09-10 (×2): via INTRAVENOUS
  Filled 2012-09-09 (×3): qty 1000

## 2012-09-09 MED ORDER — HYDROMORPHONE HCL PF 1 MG/ML IJ SOLN
0.5000 mg | Freq: Once | INTRAMUSCULAR | Status: AC
  Administered 2012-09-09: 0.5 mg via INTRAVENOUS
  Filled 2012-09-09: qty 1

## 2012-09-09 MED ORDER — IOHEXOL 300 MG/ML  SOLN
100.0000 mL | Freq: Once | INTRAMUSCULAR | Status: AC | PRN
  Administered 2012-09-09: 100 mL via INTRAVENOUS

## 2012-09-09 MED ORDER — FENTANYL CITRATE 0.05 MG/ML IJ SOLN
INTRAMUSCULAR | Status: DC | PRN
  Administered 2012-09-09: 50 ug via INTRAVENOUS
  Administered 2012-09-09: 150 ug via INTRAVENOUS

## 2012-09-09 MED ORDER — HYDROMORPHONE HCL PF 1 MG/ML IJ SOLN
1.0000 mg | INTRAMUSCULAR | Status: DC | PRN
  Administered 2012-09-09 – 2012-09-10 (×3): 1 mg via INTRAVENOUS
  Filled 2012-09-09 (×3): qty 1

## 2012-09-09 MED ORDER — HYDROMORPHONE HCL PF 1 MG/ML IJ SOLN
1.0000 mg | Freq: Once | INTRAMUSCULAR | Status: AC
  Administered 2012-09-09: 1 mg via INTRAVENOUS
  Filled 2012-09-09: qty 1

## 2012-09-09 MED ORDER — ACETAMINOPHEN 650 MG RE SUPP: 650.0000 mg | Freq: Four times a day (QID) | RECTAL | Status: DC | PRN

## 2012-09-09 MED ORDER — HYDROMORPHONE HCL PF 1 MG/ML IJ SOLN
INTRAMUSCULAR | Status: AC
  Administered 2012-09-09: 0.5 mg via INTRAVENOUS
  Filled 2012-09-09: qty 1

## 2012-09-09 MED ORDER — IOHEXOL 300 MG/ML  SOLN
25.0000 mL | INTRAMUSCULAR | Status: AC
  Administered 2012-09-09: 25 mL via ORAL

## 2012-09-09 MED ORDER — PROPOFOL 10 MG/ML IV BOLUS
INTRAVENOUS | Status: DC | PRN
  Administered 2012-09-09: 120 mg via INTRAVENOUS

## 2012-09-09 MED ORDER — ROCURONIUM BROMIDE 100 MG/10ML IV SOLN
INTRAVENOUS | Status: DC | PRN
  Administered 2012-09-09: 5 mg via INTRAVENOUS
  Administered 2012-09-09: 30 mg via INTRAVENOUS

## 2012-09-09 MED ORDER — ONDANSETRON HCL 4 MG/2ML IJ SOLN: 4.0000 mg | Freq: Four times a day (QID) | INTRAMUSCULAR | Status: DC | PRN

## 2012-09-09 MED ORDER — MORPHINE SULFATE 4 MG/ML IJ SOLN
4.0000 mg | Freq: Once | INTRAMUSCULAR | Status: AC
  Administered 2012-09-09: 4 mg via INTRAVENOUS
  Filled 2012-09-09: qty 1

## 2012-09-09 MED ORDER — BUPIVACAINE-EPINEPHRINE 0.25% -1:200000 IJ SOLN
INTRAMUSCULAR | Status: DC | PRN
  Administered 2012-09-09: 25 mL

## 2012-09-09 MED ORDER — GLYCOPYRROLATE 0.2 MG/ML IJ SOLN
INTRAMUSCULAR | Status: DC | PRN
  Administered 2012-09-09: .8 mg via INTRAVENOUS

## 2012-09-09 MED ORDER — OXYCODONE HCL 5 MG/5ML PO SOLN: 5.0000 mg | Freq: Once | ORAL | Status: DC | PRN

## 2012-09-09 MED ORDER — ACETAMINOPHEN 325 MG PO TABS
650.0000 mg | ORAL_TABLET | Freq: Four times a day (QID) | ORAL | Status: DC | PRN
  Administered 2012-09-10: 650 mg via ORAL
  Filled 2012-09-09: qty 2

## 2012-09-09 MED ORDER — NEOSTIGMINE METHYLSULFATE 1 MG/ML IJ SOLN
INTRAMUSCULAR | Status: DC | PRN
  Administered 2012-09-09: 5 mg via INTRAVENOUS

## 2012-09-09 MED ORDER — SODIUM CHLORIDE 0.9 % IR SOLN
Status: DC | PRN
  Administered 2012-09-09: 1000 mL

## 2012-09-09 MED ORDER — LEVOTHYROXINE SODIUM 150 MCG PO TABS
150.0000 ug | ORAL_TABLET | Freq: Two times a day (BID) | ORAL | Status: DC
Start: 2012-09-10 — End: 2012-09-10
  Filled 2012-09-09: qty 1

## 2012-09-09 MED ORDER — ARTIFICIAL TEARS OP OINT
TOPICAL_OINTMENT | OPHTHALMIC | Status: DC | PRN
  Administered 2012-09-09: 1 via OPHTHALMIC

## 2012-09-09 MED ORDER — HYDROMORPHONE HCL PF 1 MG/ML IJ SOLN
0.2500 mg | INTRAMUSCULAR | Status: DC | PRN
  Administered 2012-09-09: 0.5 mg via INTRAVENOUS

## 2012-09-09 MED ORDER — OXYCODONE HCL 5 MG PO TABS: 5.0000 mg | ORAL_TABLET | Freq: Once | ORAL | Status: DC | PRN

## 2012-09-09 MED ORDER — SUCCINYLCHOLINE CHLORIDE 20 MG/ML IJ SOLN
INTRAMUSCULAR | Status: DC | PRN
  Administered 2012-09-09: 140 mg via INTRAVENOUS

## 2012-09-09 MED ORDER — ONDANSETRON HCL 4 MG/2ML IJ SOLN
INTRAMUSCULAR | Status: DC | PRN
  Administered 2012-09-09: 4 mg via INTRAVENOUS

## 2012-09-09 MED ORDER — MEPERIDINE HCL 25 MG/ML IJ SOLN: 6.2500 mg | INTRAMUSCULAR | Status: DC | PRN

## 2012-09-09 MED ORDER — ONDANSETRON HCL 4 MG/2ML IJ SOLN
4.0000 mg | Freq: Once | INTRAMUSCULAR | Status: AC
  Administered 2012-09-09: 4 mg via INTRAVENOUS
  Filled 2012-09-09: qty 2

## 2012-09-09 MED ORDER — PIPERACILLIN-TAZOBACTAM 3.375 G IVPB
3.3750 g | Freq: Three times a day (TID) | INTRAVENOUS | Status: DC
  Administered 2012-09-09 – 2012-09-10 (×4): 3.375 g via INTRAVENOUS
  Filled 2012-09-09 (×5): qty 50

## 2012-09-09 MED ORDER — LIDOCAINE HCL (CARDIAC) 20 MG/ML IV SOLN
INTRAVENOUS | Status: DC | PRN
  Administered 2012-09-09: 65 mg via INTRAVENOUS

## 2012-09-09 MED ORDER — ONDANSETRON HCL 4 MG/2ML IJ SOLN: 4.0000 mg | Freq: Once | INTRAMUSCULAR | Status: DC | PRN

## 2012-09-09 MED ORDER — LACTATED RINGERS IV SOLN
INTRAVENOUS | Status: DC | PRN
  Administered 2012-09-09: 18:00:00 via INTRAVENOUS

## 2012-09-09 MED ORDER — METOCLOPRAMIDE HCL 5 MG/ML IJ SOLN
INTRAMUSCULAR | Status: DC | PRN
  Administered 2012-09-09: 10 mg via INTRAVENOUS

## 2012-09-09 SURGICAL SUPPLY — 42 items
ADH SKN CLS APL DERMABOND .7 (GAUZE/BANDAGES/DRESSINGS) ×1
ADH SKN CLS LQ APL DERMABOND (GAUZE/BANDAGES/DRESSINGS) ×1
APPLIER CLIP ROT 10 11.4 M/L (STAPLE)
APR CLP MED LRG 11.4X10 (STAPLE)
BAG SPEC RTRVL LRG 6X4 10 (ENDOMECHANICALS) ×1
BLADE SURG ROTATE 9660 (MISCELLANEOUS) IMPLANT
CANISTER SUCTION 2500CC (MISCELLANEOUS) ×2 IMPLANT
CHLORAPREP W/TINT 26ML (MISCELLANEOUS) ×2 IMPLANT
CLIP APPLIE ROT 10 11.4 M/L (STAPLE) IMPLANT
CLOTH BEACON ORANGE TIMEOUT ST (SAFETY) ×2 IMPLANT
COVER SURGICAL LIGHT HANDLE (MISCELLANEOUS) ×2 IMPLANT
CUTTER FLEX LINEAR 45M (STAPLE) ×2 IMPLANT
DECANTER SPIKE VIAL GLASS SM (MISCELLANEOUS) ×2 IMPLANT
DERMABOND ADHESIVE PROPEN (GAUZE/BANDAGES/DRESSINGS) ×1
DERMABOND ADVANCED (GAUZE/BANDAGES/DRESSINGS) ×1
DERMABOND ADVANCED .7 DNX12 (GAUZE/BANDAGES/DRESSINGS) ×1 IMPLANT
DERMABOND ADVANCED .7 DNX6 (GAUZE/BANDAGES/DRESSINGS) IMPLANT
DRAPE UTILITY 15X26 W/TAPE STR (DRAPE) ×4 IMPLANT
ELECT REM PT RETURN 9FT ADLT (ELECTROSURGICAL) ×2
ELECTRODE REM PT RTRN 9FT ADLT (ELECTROSURGICAL) ×1 IMPLANT
ENDOLOOP SUT PDS II  0 18 (SUTURE)
ENDOLOOP SUT PDS II 0 18 (SUTURE) IMPLANT
GLOVE BIO SURGEON STRL SZ7.5 (GLOVE) ×2 IMPLANT
GOWN STRL NON-REIN LRG LVL3 (GOWN DISPOSABLE) ×4 IMPLANT
KIT BASIN OR (CUSTOM PROCEDURE TRAY) ×2 IMPLANT
KIT ROOM TURNOVER OR (KITS) ×2 IMPLANT
NS IRRIG 1000ML POUR BTL (IV SOLUTION) ×2 IMPLANT
PAD ARMBOARD 7.5X6 YLW CONV (MISCELLANEOUS) ×4 IMPLANT
POUCH SPECIMEN RETRIEVAL 10MM (ENDOMECHANICALS) ×2 IMPLANT
RELOAD STAPLE 45 3.5 BLU ETS (ENDOMECHANICALS) ×1 IMPLANT
RELOAD STAPLE TA45 3.5 REG BLU (ENDOMECHANICALS) ×2 IMPLANT
SCALPEL HARMONIC ACE (MISCELLANEOUS) ×2 IMPLANT
SET IRRIG TUBING LAPAROSCOPIC (IRRIGATION / IRRIGATOR) ×2 IMPLANT
SPECIMEN JAR SMALL (MISCELLANEOUS) ×2 IMPLANT
SUT MNCRL AB 4-0 PS2 18 (SUTURE) ×2 IMPLANT
TOWEL OR 17X24 6PK STRL BLUE (TOWEL DISPOSABLE) ×2 IMPLANT
TOWEL OR 17X26 10 PK STRL BLUE (TOWEL DISPOSABLE) ×2 IMPLANT
TRAY FOLEY CATH 16FRSI W/METER (SET/KITS/TRAYS/PACK) ×1 IMPLANT
TRAY LAPAROSCOPIC (CUSTOM PROCEDURE TRAY) ×2 IMPLANT
TROCAR XCEL BLUNT TIP 100MML (ENDOMECHANICALS) ×2 IMPLANT
TROCAR XCEL NON-BLD 5MMX100MML (ENDOMECHANICALS) ×4 IMPLANT
WATER STERILE IRR 1000ML POUR (IV SOLUTION) IMPLANT

## 2012-09-09 NOTE — ED Notes (Signed)
CT notified pt completed contrast.  

## 2012-09-09 NOTE — Transfer of Care (Signed)
Immediate Anesthesia Transfer of Care Note  Patient: Sally [name]  Procedure(s) Performed: Procedure(s): APPENDECTOMY LAPAROSCOPIC (N/A)  Patient Location: PACU  Anesthesia Type:General  Level of Consciousness: awake, alert , oriented and patient cooperative  Airway & Oxygen Therapy: Patient Spontanous Breathing and Patient connected to nasal cannula oxygen  Post-op Assessment: Report given to PACU RN, Post -op Vital signs reviewed and stable and Patient moving all extremities X 4  Post vital signs: Reviewed and stable  Complications: No apparent anesthesia complications

## 2012-09-09 NOTE — Op Note (Signed)
09/09/2012  7:45 PM  PATIENT:  Sally [name]  37 y.o. female  PRE-OPERATIVE DIAGNOSIS:  Acute appendicitis  POST-OPERATIVE DIAGNOSIS:  Acute appendicitis  PROCEDURE:  Procedure(s): APPENDECTOMY LAPAROSCOPIC (N/A)  SURGEON:  Surgeon(s) and Role:    * Robyne Askew, MD - Primary  PHYSICIAN ASSISTANT:   ASSISTANTS: none   ANESTHESIA:   general  EBL:  Total I/O In: -  Out: 50 [Urine:50]  BLOOD ADMINISTERED:none  DRAINS: none   LOCAL MEDICATIONS USED:  MARCAINE     SPECIMEN:  Source of Specimen:  appendix  DISPOSITION OF SPECIMEN:  PATHOLOGY  COUNTS:  YES  TOURNIQUET:  * No tourniquets in log *  DICTATION: .Dragon Dictation After informed consent was obtained patient was brought to the operating room placed in the supine position on the operating room table. After adequate induction of general anesthesia the patient's abdomen was prepped with ChloraPrep, allowed to dry, and draped in usual sterile manner. The area below the umbilicus was infiltrated with quarter percent Marcaine. A small incision was made with a 15 blade knife. This incision was carried down through the subcutaneous tissue bluntly with a hemostat and Army-Navy retractors until the linea alba was identified. The linea alba was incised with a 15 blade knife. Each side was grasped Coker clamps and elevated anteriorly. The preperitoneal space was probed bluntly with a hemostat until the peritoneum was opened and access was gained to the abdominal cavity. A 0 Vicryl purse string stitch was placed in the fascia surrounding the opening. A Hassan cannula was placed through the opening and anchored in place with the previously placed Vicryl purse string stitch. The laparoscope was placed through the [facility] cannula. The abdomen was insufflated with carbon dioxide without difficulty. nnext the suprapubic area was infiltrated with quarter percent Marcaine. A small incision was made with a 15 blade knife. A 5 mm port was  placed bluntly through this incision into the abdominal cavity. A site was then chosen between the 2 port for placement of a 5 mm port. The area was infiltrated with quarter percent Marcaine. A small stab incision was made with a 15 blade knife. A 5 mm port was placed bluntly through this incision and the abdominal cavity under direct vision. The laparoscope was then moved to the suprapubic port. Using a Glassman grasper and harmonic scalpel the right lower quadrant was inspected. The appendix was readily identified. The appendix was elevated anteriorly and the mesoappendix was taken down sharply with the harmonic scalpel. Once the base of the appendix where it joined the cecum was identified and cleared of any tissue then a laparoscopic GIA blue load 6 row stapler was placed through the [facility] cannula. The stapler was placed across the base of the appendix clamped and fired thereby dividing the base of the appendix between staple lines. A small ooze of blood from the staple line was controlled with a clip.  A laparoscopic bag was then inserted through the [facility] cannula. The appendix was placed within the bag and the bag was sealed. The abdomen was then irrigated with copious amounts of saline until the effluent was clear. No other abnormalities were noted. The appendix and bag were removed with the [facility] cannula through the infraumbilical port without difficulty. The fascial defect was closed with the previously placed Vicryl pursestring stitch as well as with another interrupted 0 Vicryl figure-of-eight stitch. The rest of the ports were removed under direct vision and were found to be hemostatic. The gas was  allowed to escape. The skin incisions were closed with interrupted 4-0 Monocryl subcuticular stitches. Dermabond dressings were applied. The patient tolerated the procedure well. At the end of the case all needle sponge and instrument counts were correct. The patient was then awakened and taken to recovery  in stable condition.  PLAN OF CARE: Admit for overnight observation  PATIENT DISPOSITION:  PACU - hemodynamically stable.   Delay start of Pharmacological VTE agent (>24hrs) due to surgical blood loss or risk of bleeding: yes

## 2012-09-09 NOTE — ED Notes (Signed)
Reports waking up this am with severe right side pain and lower back pain. Having n/v x 2. Denies any urinary or vaginal symptoms.

## 2012-09-09 NOTE — Interval H&P Note (Signed)
History and Physical Interval Note:  09/09/2012 6:29 PM  Sally [name]  has presented today for surgery, with the diagnosis of Acute appendicitis  The various methods of treatment have been discussed with the patient and family. After consideration of risks, benefits and other options for treatment, the patient has consented to  Procedure(s): APPENDECTOMY LAPAROSCOPIC (N/A) as a surgical intervention .  The patient's history has been reviewed, patient examined, no change in status, stable for surgery.  I have reviewed the patient's chart and labs.  Questions were answered to the patient's satisfaction.     TOTH III,PAUL S

## 2012-09-09 NOTE — Progress Notes (Signed)
Pt. Arrived to pacu from or c/o need to urinate.  Pt. Attempted to void, voided a teaspoon full and complained of burning when urinating.  I explained that she just had a urinary catheter removed and she has some irritation from that catheter removal.

## 2012-09-09 NOTE — ED Notes (Signed)
Dr Toth at bedside. 

## 2012-09-09 NOTE — ED Notes (Signed)
[name] PA at bedside   

## 2012-09-09 NOTE — ED Provider Notes (Signed)
CSN: [id number]     Arrival date & time 09/09/12  0900 History     First MD Initiated Contact with Patient 09/09/12 [phone]     Chief Complaint  Patient presents with  . Abdominal Pain  . Emesis  . Flank Pain   (Consider location/radiation/quality/duration/timing/severity/associated sxs/prior Treatment) HPI Sally [name] is a 37 y.o. female who presents to emergency department with complaint of abdominal pain and back pain. Patient states pain woke her from sleep about 2 hours ago. Patient states pain started in the right lower back and now radiating into the right lower abdomen. Patient states pain is sharp, rates it as 9/10. Patient states she's has had some nausea and vomiting with this pain. Patient states she has not had any urinary symptoms and has not seen any blood in her urine this morning. Patient states she had a normal bowel movement this morning. Patient admits to history of ovarian cysts and has recently been seen for them but states he was on the left side. Patient did not take any medications prior to coming in. Patient denies any similar pain in the past. Patient denies fever admits to chills. No other complaints.   Past Medical History  Diagnosis Date  . Hypothyroidism   . Anxiety   . Fibromyalgia   . Sleep apnea     pre lap banding-never used cpap or mask  . Back pain     Occ. issues wih back pain   Past Surgical History  Procedure Laterality Date  . Cesarean section  1997, 2013  . Laparoscopic gastric banding  1997  . Knee surgery    . Laparoscopy  08/19/2010    Procedure: LAPAROSCOPY OPERATIVE;  Surgeon: Lum Keas;  Location: [facility];  Service: Gynecology;  Laterality: N/A;  with Biopsy of uterine serosa  . Tubal ligation      8/13  . Gastric banding port revision  01/20/2012    Procedure: GASTRIC BANDING PORT REVISION;  Surgeon: Valarie Merino, MD;  Location: [facility];  Service: General;  Laterality: N/A;   Family History  Problem Relation Age of  Onset  . Hypertension Maternal Grandfather   . Diabetes Paternal Grandmother   . Anesthesia problems Neg Hx    History  Substance Use Topics  . Smoking status: Current Every Day Smoker -- 0.25 packs/day    Types: Cigarettes  . Smokeless tobacco: Never Used  . Alcohol Use: Yes     Comment: rarely   OB History   Grav Para Term Preterm Abortions TAB SAB Ect Mult Living   2 2 2       2      Review of Systems  Constitutional: Negative for fever and chills.  HENT: Negative for neck pain and neck stiffness.   Respiratory: Negative.   Cardiovascular: Negative.   Gastrointestinal: Positive for nausea, vomiting and abdominal pain. Negative for diarrhea, constipation and blood in stool.  Genitourinary: Positive for flank pain and pelvic pain. Negative for dysuria, hematuria, vaginal bleeding, vaginal discharge, vaginal pain and dyspareunia.  Musculoskeletal: Negative.   Skin: Negative.   Neurological: Negative for weakness and numbness.  All other systems reviewed and are negative.    Allergies  Paroxetine hcl; Percocet; and Codeine  Home Medications   Current Outpatient Rx  Name  Route  Sig  Dispense  Refill  . acetaminophen (TYLENOL) 500 MG tablet   Oral   Take 1,000 mg by mouth every 6 (six) hours as needed. Pain         .  levothyroxine (SYNTHROID, LEVOTHROID) 150 MCG tablet   Oral   Take 150 mcg by mouth 2 (two) times daily.         Marland Kitchen loratadine (CLARITIN) 10 MG tablet   Oral   Take 10 mg by mouth daily as needed for allergies.         Marland Kitchen tetrahydrozoline-zinc (VISINE-AC) 0.05-0.25 % ophthalmic solution   Both Eyes   Place 2 drops into both eyes as needed. For allergies         . traMADol (ULTRAM) 50 MG tablet   Oral   Take 50 mg by mouth every 6 (six) hours as needed for pain.           BP 145/73  Pulse 94  Temp(Src) 98.1 F (36.7 C) (Oral)  Resp 18  SpO2 99%  LMP 08/31/2012 Physical Exam  Nursing note and vitals reviewed. Constitutional: She  appears well-developed and well-nourished. No distress.  HENT:  Head: Normocephalic.  Eyes: Conjunctivae are normal.  Neck: Neck supple.  Cardiovascular: Normal rate, regular rhythm and normal heart sounds.   Pulmonary/Chest: Effort normal and breath sounds normal. No respiratory distress. She has no wheezes. She has no rales.  Abdominal: Soft. Bowel sounds are normal. She exhibits no distension. There is tenderness. There is no rebound.  RLQ tenderness. No right CVA tenderness  Genitourinary:  Normal external genitalia. Normal vaginal canal with no discharge. Cervix is normal. Right adnexal tenderness with no masses palpated. Normal uterus and left adnexa.    Musculoskeletal: She exhibits no edema.  Tender to palpation over right mid perivertebral area.   Neurological: She is alert.  Skin: Skin is warm and dry.  Psychiatric: She has a normal mood and affect. Her behavior is normal.    ED Course   Procedures (including critical care time) Results for orders placed during the hospital encounter of 09/09/12  WET PREP, GENITAL      Result Value Range   Yeast Wet Prep HPF POC NONE SEEN  NONE SEEN   Trich, Wet Prep NONE SEEN  NONE SEEN   Clue Cells Wet Prep HPF POC NONE SEEN  NONE SEEN   WBC, Wet Prep HPF POC NONE SEEN  NONE SEEN  CBC WITH DIFFERENTIAL      Result Value Range   WBC 13.6 (*) 4.0 - 10.5 K/uL   RBC 4.78  3.87 - 5.11 MIL/uL   Hemoglobin 15.6 (*) 12.0 - 15.0 g/dL   HCT 16.1  09.6 - 04.5 %   MCV 90.8  78.0 - 100.0 fL   MCH 32.6  26.0 - 34.0 pg   MCHC 35.9  30.0 - 36.0 g/dL   RDW 40.9  81.1 - 91.4 %   Platelets 243  150 - 400 K/uL   Neutrophils Relative % 83 (*) 43 - 77 %   Neutro Abs 11.3 (*) 1.7 - 7.7 K/uL   Lymphocytes Relative 11 (*) 12 - 46 %   Lymphs Abs 1.5  0.7 - 4.0 K/uL   Monocytes Relative 5  3 - 12 %   Monocytes Absolute 0.7  0.1 - 1.0 K/uL   Eosinophils Relative 1  0 - 5 %   Eosinophils Absolute 0.1  0.0 - 0.7 K/uL   Basophils Relative 0  0 - 1 %    Basophils Absolute 0.0  0.0 - 0.1 K/uL  COMPREHENSIVE METABOLIC PANEL      Result Value Range   Sodium 136  135 - 145 mEq/L   Potassium 4.1  3.5 - 5.1 mEq/L   Chloride 104  96 - 112 mEq/L   CO2 23  19 - 32 mEq/L   Glucose, Bld 127 (*) 70 - 99 mg/dL   BUN 12  6 - 23 mg/dL   Creatinine, Ser 6.21  0.50 - 1.10 mg/dL   Calcium 8.9  8.4 - 30.8 mg/dL   Total Protein 6.9  6.0 - 8.3 g/dL   Albumin 3.3 (*) 3.5 - 5.2 g/dL   AST 16  0 - 37 U/L   ALT 16  0 - 35 U/L   Alkaline Phosphatase 78  39 - 117 U/L   Total Bilirubin 0.2 (*) 0.3 - 1.2 mg/dL   GFR calc non Af Amer 88 (*) >90 mL/min   GFR calc Af Amer >90  >90 mL/min  URINALYSIS, ROUTINE W REFLEX MICROSCOPIC      Result Value Range   Color, Urine YELLOW  YELLOW   APPearance CLOUDY (*) CLEAR   Specific Gravity, Urine 1.025  1.005 - 1.030   pH 5.0  5.0 - 8.0   Glucose, UA NEGATIVE  NEGATIVE mg/dL   Hgb urine dipstick NEGATIVE  NEGATIVE   Bilirubin Urine NEGATIVE  NEGATIVE   Ketones, ur NEGATIVE  NEGATIVE mg/dL   Protein, ur NEGATIVE  NEGATIVE mg/dL   Urobilinogen, UA 0.2  0.0 - 1.0 mg/dL   Nitrite NEGATIVE  NEGATIVE   Leukocytes, UA NEGATIVE  NEGATIVE  LIPASE, BLOOD      Result Value Range   Lipase 17  11 - 59 U/L  POCT PREGNANCY, URINE      Result Value Range   Preg Test, Ur NEGATIVE  NEGATIVE   US Transvaginal Non-ob  09/09/2012   *RADIOLOGY REPORT*  Clinical Data: Right adnexal pain.  Question torsion.  Prior ovarian cystectomy.  LMP 08/31/2012  TRANSABDOMINAL AND TRANSVAGINAL ULTRASOUND OF PELVIS Technique:  Both transabdominal and transvaginal ultrasound examinations of the pelvis were performed. Transabdominal technique was performed for global imaging of the pelvis including uterus, ovaries, adnexal regions, and pelvic cul-de-sac.  It was necessary to proceed with endovaginal exam following the transabdominal exam to visualize the myometrium, endometrium and adnexa.  Comparison:  None  Findings:  Uterus: Is anteverted and  anteflexed and demonstrates a sagittal length of 8.2 cm, depth of 3.4 cm and width of 3.9 cm.  No focal mural abnormality is seen  Endometrium: Appears trilayered with a width of 8.5 mm.  No areas of focal thickening or heterogeneity are seen and this would correlate with a periovulatory endometrium and corresponds with the provided LMP of 08/31/2012  Right ovary:  Measures 2.1 x 1.1 x 2.2 cm and has a normal appearance.  Intrastromal flow is seen with color Doppler assessment  Left ovary: Measures 2.5 x 2.1 x 2.5 cm and contains a dominant follicle  Other findings: No pelvic fluid or separate adnexal masses are seen.  DOPPLER ULTRASOUND OF OVARIES  Technique:  Color and duplex Doppler ultrasound was utilized to evaluate blood flow to the ovaries.  Findings: Normal arterial and venous flow was seen with pulsed doppler exam in both ovaries.  IMPRESSION: Normal periovulatory pelvic ultrasound. No sonographic features worrisome for torsion seen.   Original Report Authenticated By: Rhodia Albright, M.D.   US Pelvis Complete  09/09/2012   *RADIOLOGY REPORT*  Clinical Data: Right adnexal pain.  Question torsion.  Prior ovarian cystectomy.  LMP 08/31/2012  TRANSABDOMINAL AND TRANSVAGINAL ULTRASOUND OF PELVIS Technique:  Both transabdominal and transvaginal ultrasound examinations of the pelvis  were performed. Transabdominal technique was performed for global imaging of the pelvis including uterus, ovaries, adnexal regions, and pelvic cul-de-sac.  It was necessary to proceed with endovaginal exam following the transabdominal exam to visualize the myometrium, endometrium and adnexa.  Comparison:  None  Findings:  Uterus: Is anteverted and anteflexed and demonstrates a sagittal length of 8.2 cm, depth of 3.4 cm and width of 3.9 cm.  No focal mural abnormality is seen  Endometrium: Appears trilayered with a width of 8.5 mm.  No areas of focal thickening or heterogeneity are seen and this would correlate with a periovulatory  endometrium and corresponds with the provided LMP of 08/31/2012  Right ovary:  Measures 2.1 x 1.1 x 2.2 cm and has a normal appearance.  Intrastromal flow is seen with color Doppler assessment  Left ovary: Measures 2.5 x 2.1 x 2.5 cm and contains a dominant follicle  Other findings: No pelvic fluid or separate adnexal masses are seen.  DOPPLER ULTRASOUND OF OVARIES  Technique:  Color and duplex Doppler ultrasound was utilized to evaluate blood flow to the ovaries.  Findings: Normal arterial and venous flow was seen with pulsed doppler exam in both ovaries.  IMPRESSION: Normal periovulatory pelvic ultrasound. No sonographic features worrisome for torsion seen.   Original Report Authenticated By: Rhodia Albright, M.D.   Ct Abdomen Pelvis W Contrast  09/09/2012   *RADIOLOGY REPORT*  Clinical Data: Severe right-sided low back pain.  Nausea and vomiting.  CT ABDOMEN AND PELVIS WITH CONTRAST  Technique:  Multidetector CT imaging of the abdomen and pelvis was performed following the standard protocol during bolus administration of intravenous contrast.  Contrast: OMNIPAQUE IOHEXOL 300 MG/ML  SOLN  Comparison: CT abdomen and pelvis 12/09/2008.  Findings: Dependent basilar atelectasis is identified.  No pleural or pericardial effusion.  The patient has a lap band in place.  The device appears intact. The liver, gallbladder, adrenal glands, spleen, pancreas and kidneys all appear normal.  The appendix is dilated with periappendiceal inflammatory change consistent with acute appendicitis.  No abscess or perforation is identified.  The stomach and small and large bowel appear normal. No lymphadenopathy is seen.  No focal bony abnormality.  IMPRESSION: Study is positive for acute appendicitis without abscess or perforation.  Critical Value/emergent results were called by telephone at the time of interpretation on 09/09/2012 at 3:35 p.m. to Berenice Primas, who verbally acknowledged these results.   Original Report  Authenticated By: Holley Dexter, M.D.   US Renal  09/09/2012    *RADIOLOGY REPORT*  Clinical Data: Abdominal pain and vomiting.  Flank pain  RENAL/URINARY TRACT ULTRASOUND COMPLETE  Comparison:  CT abdomen 12/09/2008  Findings:  Right Kidney:  11.4 cm in length.  Negative for obstruction or mass.  Renal cortex appears normal.  Left Kidney:  12.4 cm in length.  Negative for obstruction or mass. Renal cortex appears normal.  Bladder:  Negative  IMPRESSION: Negative   Original Report Authenticated By: Janeece Riggers, M.D.     No results found. 1. Acute appendicitis     MDM  Patient was in the right flank pain and right lower abdominal pain. Initial examination pain to be more right adnexal. Patient does have history of ovarian cyst that she has recently been followed by GYN doctor for. On pelvic exam patient was tender in the right adnexa. Ultrasound pelvis obtained initially to rule out ovarian torsion and ovarian abscess and was negative. Renal ultrasound was obtained due to concern of possible kidney stone due to  right flank pain and was negative as well. Given patient's continued to have pretty significant tenderness in the right lower quadrant for a mildly elevated white blood count, CT abdomen and pelvis with IV contrast and oral contrast was obtained. Patient signed out at shift change with PA Geiple pending CT scan results. His chest pain was treated with IV Dilaudid and Zofran.  Lottie Mussel, PA-C 09/10/12 [phone]

## 2012-09-09 NOTE — Anesthesia Preprocedure Evaluation (Addendum)
Anesthesia Evaluation  Patient identified by MRN, date of birth, ID band Patient awake    Reviewed: Allergy & Precautions, H&P , NPO status , Patient's Chart, lab work & pertinent test results  History of Anesthesia Complications Negative for: history of anesthetic complications  Airway Mallampati: I TM Distance: >3 FB Neck ROM: Full    Dental   Pulmonary sleep apnea , Current Smoker,          Cardiovascular negative cardio ROS      Neuro/Psych Anxiety    GI/Hepatic Neg liver ROS, Patient received Oral Contrast Agents,PO Contrast at 1500  S/P Gastric banding 2009; 60 lb wt loss   Endo/Other  Hypothyroidism Obesity  Renal/GU negative Renal ROS     Musculoskeletal  (+) Fibromyalgia -  Abdominal   Peds  Hematology   Anesthesia Other Findings   Reproductive/Obstetrics negative OB ROS                          Anesthesia Physical Anesthesia Plan  ASA: III  Anesthesia Plan: General   Post-op Pain Management:    Induction: Intravenous, Rapid sequence and Cricoid pressure planned  Airway Management Planned: Oral ETT  Additional Equipment:   Intra-op Plan:   Post-operative Plan: Extubation in OR  Informed Consent: I have reviewed the patients History and Physical, chart, labs and discussed the procedure including the risks, benefits and alternatives for the proposed anesthesia with the patient or authorized representative who has indicated his/her understanding and acceptance.   Dental advisory given  Plan Discussed with: CRNA and Surgeon  Anesthesia Plan Comments:       Anesthesia Quick Evaluation

## 2012-09-09 NOTE — Anesthesia Postprocedure Evaluation (Signed)
Anesthesia Post Note  Patient: Sally [name]  Procedure(s) Performed: Procedure(s) (LRB): APPENDECTOMY LAPAROSCOPIC (N/A)  Anesthesia type: general  Patient location: PACU  Post pain: Pain level controlled  Post assessment: Patient's Cardiovascular Status Stable  Last Vitals:  Filed Vitals:   09/09/12 2129  BP: 131/79  Pulse: 76  Temp: 36.9 C  Resp: 18    Post vital signs: Reviewed and stable  Level of consciousness: sedated  Complications: No apparent anesthesia complications

## 2012-09-09 NOTE — Preoperative (Signed)
Beta Blockers   Reason not to administer Beta Blockers:Not Applicable 

## 2012-09-09 NOTE — ED Provider Notes (Signed)
3:43 PM Handoff from [employer]. H/o LAP band, c-sections -- presents with RLQ abd pain starting this AM. Last oral intake (other than CM) was last night. Neg US pelvis, US renal in ED. CT shows acute appendicitis without rupture or abscess.   Pt informed. Surgery paged.   Exam:  Gen NAD; Heart RRR, nml S1,S2, no m/r/g; Lungs CTAB; Abd soft, moderate RLQ tenderness, no rebound, voluntary guarding; Ext 2+ pedal pulses bilaterally, no edema.    Renne Crigler, PA-C 09/09/12 1547

## 2012-09-09 NOTE — H&P (Signed)
Sally [name] is an 37 y.o. female.   Chief Complaint: Abdominal pain, Nausea, and vomiting HPI: Pt reports she was fine yesterday and awoke with abdominal pain in RLQ about 6 AM this morning.  She reports significant pain not relieved till she came to the ER.  She continues to have pain, and nausea;  vomiting is better.  She has not eaten since last night, except for Contrast for CT scan this afternoon.  Work up shows a normal CMP, WBC is elevated, H/H is elevated. Ct scan shows a dilated appendix with inflammatory changes, no abscess or perforation noted.  She also has a Lap Band in place.  She reports about 100 pound loss back in2009 when she had it placed.  She has had a 40 pound gain since her last pregnancy last year.  She has had two children with C Sections.  Renal ultrasound, and transvaginal US were both reported normal.  Urine pregnancy study was normal.  Past Medical History  Diagnosis Date  . Hypothyroidism currently on supplement   . Anxiety--  No issue recently, no meds since last pregnancy.   . Fibromyalgia  Usually her back, she treats with tramadol.   . Sleep apnea     pre lap banding-never used cpap or mask  . Back pain     Occ. issues wih back pain    Past Surgical History  Procedure Laterality Date  . Cesarean section  1997, 2013  . Laparoscopic gastric banding  1997  . Knee surgery    . Laparoscopy  08/19/2010    Procedure: LAPAROSCOPY OPERATIVE;  Surgeon: Lum Keas;  Location: [facility];  Service: Gynecology;  Laterality: N/A;  with Biopsy of uterine serosa  . Tubal ligation      8/13  . Gastric banding port revision  01/20/2012    Procedure: GASTRIC BANDING PORT REVISION;  Surgeon: Valarie Merino, MD;  Location: [facility];  Service: General;  Laterality: N/A;       01/20/12    Family History  Problem Relation Age of Onset  . Hypertension Maternal Grandfather   . Diabetes Paternal Grandmother   . Anesthesia problems Neg Hx    Social History:  reports  that she has been smoking Cigarettes.  She has been smoking about 0.25 packs per day. She has never used smokeless tobacco. She reports that  drinks alcohol. She reports that she does not use illicit drugs. Tobacco:  1ppd for about 20 year, currently down to < 1PPD. ETOH: rare Drugs: none Married; 2 kids, youngest is almost 1 Works as a Science writer for [employer]   Allergies:  Allergies  Allergen Reactions  . Paroxetine Hcl Rash  . Percocet [Oxycodone-Acetaminophen] Itching  . Codeine Other (See Comments)    Puts patient to sleep "knocks her out" for at least two days, regardless of dosage.   Prior to Admission medications   Medication Sig Start Date End Date Taking? Authorizing Provider  acetaminophen (TYLENOL) 500 MG tablet Take 1,000 mg by mouth every 6 (six) hours as needed. Pain   Yes Historical Provider, MD  levothyroxine (SYNTHROID, LEVOTHROID) 150 MCG tablet Take 150 mcg by mouth 2 (two) times daily.   Yes Historical Provider, MD  loratadine (CLARITIN) 10 MG tablet Take 10 mg by mouth daily as needed for allergies.   Yes Historical Provider, MD  tetrahydrozoline-zinc (VISINE-AC) 0.05-0.25 % ophthalmic solution Place 2 drops into both eyes as needed. For allergies   Yes Historical Provider, MD  traMADol Janean Sark)  50 MG tablet Take 50 mg by mouth every 6 (six) hours as needed for pain.    Yes Historical Provider, MD     (Not in a hospital admission)  Results for orders placed during the hospital encounter of 09/09/12 (from the past 48 hour(s))  CBC WITH DIFFERENTIAL     Status: Abnormal   Collection Time    09/09/12  9:30 AM      Result Value Range   WBC 13.6 (*) 4.0 - 10.5 K/uL   RBC 4.78  3.87 - 5.11 MIL/uL   Hemoglobin 15.6 (*) 12.0 - 15.0 g/dL   HCT 29.5  28.4 - 13.2 %   MCV 90.8  78.0 - 100.0 fL   MCH 32.6  26.0 - 34.0 pg   MCHC 35.9  30.0 - 36.0 g/dL   RDW 44.0  10.2 - 72.5 %   Platelets 243  150 - 400 K/uL   Neutrophils Relative % 83 (*) 43 - 77 %   Neutro Abs 11.3 (*)  1.7 - 7.7 K/uL   Lymphocytes Relative 11 (*) 12 - 46 %   Lymphs Abs 1.5  0.7 - 4.0 K/uL   Monocytes Relative 5  3 - 12 %   Monocytes Absolute 0.7  0.1 - 1.0 K/uL   Eosinophils Relative 1  0 - 5 %   Eosinophils Absolute 0.1  0.0 - 0.7 K/uL   Basophils Relative 0  0 - 1 %   Basophils Absolute 0.0  0.0 - 0.1 K/uL  COMPREHENSIVE METABOLIC PANEL     Status: Abnormal   Collection Time    09/09/12  9:30 AM      Result Value Range   Sodium 136  135 - 145 mEq/L   Potassium 4.1  3.5 - 5.1 mEq/L   Chloride 104  96 - 112 mEq/L   CO2 23  19 - 32 mEq/L   Glucose, Bld 127 (*) 70 - 99 mg/dL   BUN 12  6 - 23 mg/dL   Creatinine, Ser 3.66  0.50 - 1.10 mg/dL   Calcium 8.9  8.4 - 44.0 mg/dL   Total Protein 6.9  6.0 - 8.3 g/dL   Albumin 3.3 (*) 3.5 - 5.2 g/dL   AST 16  0 - 37 U/L   ALT 16  0 - 35 U/L   Alkaline Phosphatase 78  39 - 117 U/L   Total Bilirubin 0.2 (*) 0.3 - 1.2 mg/dL   GFR calc non Af Amer 88 (*) >90 mL/min   GFR calc Af Amer >90  >90 mL/min   Comment: (NOTE)     The eGFR has been calculated using the CKD EPI equation.     This calculation has not been validated in all clinical situations.     eGFR's persistently <90 mL/min signify possible Chronic Kidney     Disease.  LIPASE, BLOOD     Status: None   Collection Time    09/09/12  9:30 AM      Result Value Range   Lipase 17  11 - 59 U/L  URINALYSIS, ROUTINE W REFLEX MICROSCOPIC     Status: Abnormal   Collection Time    09/09/12  9:42 AM      Result Value Range   Color, Urine YELLOW  YELLOW   APPearance CLOUDY (*) CLEAR   Specific Gravity, Urine 1.025  1.005 - 1.030   pH 5.0  5.0 - 8.0   Glucose, UA NEGATIVE  NEGATIVE mg/dL   Hgb  urine dipstick NEGATIVE  NEGATIVE   Bilirubin Urine NEGATIVE  NEGATIVE   Ketones, ur NEGATIVE  NEGATIVE mg/dL   Protein, ur NEGATIVE  NEGATIVE mg/dL   Urobilinogen, UA 0.2  0.0 - 1.0 mg/dL   Nitrite NEGATIVE  NEGATIVE   Leukocytes, UA NEGATIVE  NEGATIVE   Comment: MICROSCOPIC NOT DONE ON URINES  WITH NEGATIVE PROTEIN, BLOOD, LEUKOCYTES, NITRITE, OR GLUCOSE <1000 mg/dL.  POCT PREGNANCY, URINE     Status: None   Collection Time    09/09/12  9:46 AM      Result Value Range   Preg Test, Ur NEGATIVE  NEGATIVE   Comment:            THE SENSITIVITY OF THIS     METHODOLOGY IS >24 mIU/mL  WET PREP, GENITAL     Status: None   Collection Time    09/09/12 10:32 AM      Result Value Range   Yeast Wet Prep HPF POC NONE SEEN  NONE SEEN   Trich, Wet Prep NONE SEEN  NONE SEEN   Clue Cells Wet Prep HPF POC NONE SEEN  NONE SEEN   WBC, Wet Prep HPF POC NONE SEEN  NONE SEEN   US Transvaginal Non-ob  09/09/2012   *RADIOLOGY REPORT*  Clinical Data: Right adnexal pain.  Question torsion.  Prior ovarian cystectomy.  LMP 08/31/2012  TRANSABDOMINAL AND TRANSVAGINAL ULTRASOUND OF PELVIS Technique:  Both transabdominal and transvaginal ultrasound examinations of the pelvis were performed. Transabdominal technique was performed for global imaging of the pelvis including uterus, ovaries, adnexal regions, and pelvic cul-de-sac.  It was necessary to proceed with endovaginal exam following the transabdominal exam to visualize the myometrium, endometrium and adnexa.  Comparison:  None  Findings:  Uterus: Is anteverted and anteflexed and demonstrates a sagittal length of 8.2 cm, depth of 3.4 cm and width of 3.9 cm.  No focal mural abnormality is seen  Endometrium: Appears trilayered with a width of 8.5 mm.  No areas of focal thickening or heterogeneity are seen and this would correlate with a periovulatory endometrium and corresponds with the provided LMP of 08/31/2012  Right ovary:  Measures 2.1 x 1.1 x 2.2 cm and has a normal appearance.  Intrastromal flow is seen with color Doppler assessment  Left ovary: Measures 2.5 x 2.1 x 2.5 cm and contains a dominant follicle  Other findings: No pelvic fluid or separate adnexal masses are seen.  DOPPLER ULTRASOUND OF OVARIES  Technique:  Color and duplex Doppler ultrasound was  utilized to evaluate blood flow to the ovaries.  Findings: Normal arterial and venous flow was seen with pulsed doppler exam in both ovaries.  IMPRESSION: Normal periovulatory pelvic ultrasound. No sonographic features worrisome for torsion seen.   Original Report Authenticated By: Rhodia Albright, M.D.   US Pelvis Complete  09/09/2012   *RADIOLOGY REPORT*  Clinical Data: Right adnexal pain.  Question torsion.  Prior ovarian cystectomy.  LMP 08/31/2012  TRANSABDOMINAL AND TRANSVAGINAL ULTRASOUND OF PELVIS Technique:  Both transabdominal and transvaginal ultrasound examinations of the pelvis were performed. Transabdominal technique was performed for global imaging of the pelvis including uterus, ovaries, adnexal regions, and pelvic cul-de-sac.  It was necessary to proceed with endovaginal exam following the transabdominal exam to visualize the myometrium, endometrium and adnexa.  Comparison:  None  Findings:  Uterus: Is anteverted and anteflexed and demonstrates a sagittal length of 8.2 cm, depth of 3.4 cm and width of 3.9 cm.  No focal mural abnormality is seen  Endometrium: Appears trilayered with a width of 8.5 mm.  No areas of focal thickening or heterogeneity are seen and this would correlate with a periovulatory endometrium and corresponds with the provided LMP of 08/31/2012  Right ovary:  Measures 2.1 x 1.1 x 2.2 cm and has a normal appearance.  Intrastromal flow is seen with color Doppler assessment  Left ovary: Measures 2.5 x 2.1 x 2.5 cm and contains a dominant follicle  Other findings: No pelvic fluid or separate adnexal masses are seen.  DOPPLER ULTRASOUND OF OVARIES  Technique:  Color and duplex Doppler ultrasound was utilized to evaluate blood flow to the ovaries.  Findings: Normal arterial and venous flow was seen with pulsed doppler exam in both ovaries.  IMPRESSION: Normal periovulatory pelvic ultrasound. No sonographic features worrisome for torsion seen.   Original Report Authenticated By:  Rhodia Albright, M.D.   Ct Abdomen Pelvis W Contrast  09/09/2012   *RADIOLOGY REPORT*  Clinical Data: Severe right-sided low back pain.  Nausea and vomiting.  CT ABDOMEN AND PELVIS WITH CONTRAST  Technique:  Multidetector CT imaging of the abdomen and pelvis was performed following the standard protocol during bolus administration of intravenous contrast.  Contrast: OMNIPAQUE IOHEXOL 300 MG/ML  SOLN  Comparison: CT abdomen and pelvis 12/09/2008.  Findings: Dependent basilar atelectasis is identified.  No pleural or pericardial effusion.  The patient has a lap band in place.  The device appears intact. The liver, gallbladder, adrenal glands, spleen, pancreas and kidneys all appear normal.  The appendix is dilated with periappendiceal inflammatory change consistent with acute appendicitis.  No abscess or perforation is identified.  The stomach and small and large bowel appear normal. No lymphadenopathy is seen.  No focal bony abnormality.  IMPRESSION: Study is positive for acute appendicitis without abscess or perforation.  Critical Value/emergent results were called by telephone at the time of interpretation on 09/09/2012 at 3:35 p.m. to Berenice Primas, who verbally acknowledged these results.   Original Report Authenticated By: Holley Dexter, M.D.   US Renal  09/09/2012    *RADIOLOGY REPORT*  Clinical Data: Abdominal pain and vomiting.  Flank pain  RENAL/URINARY TRACT ULTRASOUND COMPLETE  Comparison:  CT abdomen 12/09/2008  Findings:  Right Kidney:  11.4 cm in length.  Negative for obstruction or mass.  Renal cortex appears normal.  Left Kidney:  12.4 cm in length.  Negative for obstruction or mass. Renal cortex appears normal.  Bladder:  Negative  IMPRESSION: Negative   Original Report Authenticated By: Janeece Riggers, M.D.    Review of Systems  Constitutional: Positive for chills. Negative for weight loss, malaise/fatigue and diaphoresis.       No fever recorded. Symptoms started about 6 AM this  morning, she was fine yesterday.  HENT: Negative.        Some nasal drainage with allergies.  Eyes: Negative.   Respiratory: Negative.   Cardiovascular: Negative.   Gastrointestinal: Positive for nausea (some this Am with one episode of vomiting.), vomiting and abdominal pain (RLQ, she doesn't point to any one  place, just over lower abdomen). Negative for diarrhea, constipation, blood in stool and melena.  Genitourinary: Negative.   Musculoskeletal: Positive for back pain (chronic intermittent back pain.).  Skin: Negative.   Neurological: Negative.  Negative for weakness.       Occasional headaches  Endo/Heme/Allergies: Negative.   Psychiatric/Behavioral: Negative.     Blood pressure 130/51, pulse 83, temperature 98.1 F (36.7 C), temperature source Oral, resp. rate 18, last menstrual period 08/31/2012,  SpO2 98.00%. Physical Exam  Constitutional: She is oriented to person, place, and time. She appears well-developed and well-nourished. No distress.  Well nourished/well developed female, tired, but no acute distress.  HENT:  Head: Normocephalic and atraumatic.  Nose: Nose normal.  Eyes: Conjunctivae and EOM are normal. Pupils are equal, round, and reactive to light. Right eye exhibits no discharge. Left eye exhibits no discharge. No scleral icterus.  Neck: Normal range of motion. Neck supple. No JVD present. No tracheal deviation present. No thyromegaly present.  Cardiovascular: Normal rate, regular rhythm, normal heart sounds and intact distal pulses.  Exam reveals no gallop.   No murmur heard. Respiratory: Effort normal and breath sounds normal. No respiratory distress. She has no wheezes. She has no rales. She exhibits no tenderness.  GI: Soft. Bowel sounds are normal. She exhibits no distension and no mass. There is tenderness (RLQ, ok on palpation to other sites of abdomen.). There is no rebound and no guarding.  laparoscopic incisions abdomen, with palpable port right upper  abdomen.  Musculoskeletal: Normal range of motion. She exhibits no edema and no tenderness.  Lymphadenopathy:    She has no cervical adenopathy.  Neurological: She is alert and oriented to person, place, and time. No cranial nerve deficit.  Skin: Skin is warm and dry. No rash noted. She is not diaphoretic. No erythema. No pallor.  Psychiatric: She has a normal mood and affect. Her behavior is normal. Judgment and thought content normal.     Assessment/Plan 1.  Acute appendicitis 2. S/p lap band placement Dr. Daphine Deutscher, 1997, with revision 01/19/13. 3.Hypothyroidism 4.Hx of fibromyalgia-- related to back pain. 5.  Hx of sleep apnea, not using any device at home. 6. Hx of 2 prior C Section.  Plan:  I have admitted her, and Dr. Carolynne Edouard will see and plan to take her to the OR tonight.  He will attempt to do it laparoscopically if he can.  Sharen Youngren 09/09/2012, 4:47 PM

## 2012-09-10 ENCOUNTER — Encounter: Payer: Self-pay | Admitting: General Surgery

## 2012-09-10 ENCOUNTER — Encounter (HOSPITAL_COMMUNITY): Payer: Self-pay | Admitting: *Deleted

## 2012-09-10 MED ORDER — ACETAMINOPHEN 325 MG PO TABS: 650.0000 mg | ORAL_TABLET | Freq: Four times a day (QID) | ORAL | Status: DC | PRN

## 2012-09-10 MED ORDER — TRAMADOL HCL 50 MG PO TABS: 50.0000 mg | ORAL_TABLET | Freq: Four times a day (QID) | ORAL | Status: DC | PRN

## 2012-09-10 MED ORDER — TRAMADOL HCL 50 MG PO TABS
50.0000 mg | ORAL_TABLET | Freq: Four times a day (QID) | ORAL | Status: DC | PRN
  Administered 2012-09-10: 100 mg via ORAL
  Filled 2012-09-10: qty 2

## 2012-09-10 NOTE — ED Provider Notes (Signed)
Medical screening examination/treatment/procedure(s) were performed by non-physician practitioner and as supervising physician I was immediately available for consultation/collaboration.   Melenie Minniear, MD 09/10/12 1017 

## 2012-09-10 NOTE — Progress Notes (Signed)
1 Day Post-Op  Subjective: Up in chair, sore, and tired.  Pain is better.  Objective: Vital signs in last 24 hours: Temp:  [98.1 F (36.7 C)-98.7 F (37.1 C)] 98.5 F (36.9 C) (08/22 1610) Pulse Rate:  [59-105] 79 (08/22 0633) Resp:  [13-30] 18 (08/22 0633) BP: (120-156)/(51-106) 124/61 mmHg (08/22 0633) SpO2:  [91 %-99 %] 94 % (08/22 0633) Weight:  [122.925 kg (271 lb)] 122.925 kg (271 lb) (08/22 0557)  afebrile, VSS No labs  Intake/Output from previous day: 08/21 0701 - 08/22 0700 In: 1000 [I.V.:1000] Out: 570 [Urine:550; Blood:20] Intake/Output this shift:    General appearance: alert, cooperative and no distress GI: soft tender, bs present, no distension, incisions look fine.  Lab Results:   Recent Labs  09/09/12 0930  WBC 13.6*  HGB 15.6*  HCT 43.4  PLT 243    BMET  Recent Labs  09/09/12 0930  NA 136  K 4.1  CL 104  CO2 23  GLUCOSE 127*  BUN 12  CREATININE 0.83  CALCIUM 8.9   PT/INR No results found for this basename: LABPROT, INR,  in the last 72 hours   Recent Labs Lab 09/09/12 0930  AST 16  ALT 16  ALKPHOS 78  BILITOT 0.2*  PROT 6.9  ALBUMIN 3.3*     Lipase     Component Value Date/Time   LIPASE 17 09/09/2012 0930     Studies/Results: US Transvaginal Non-ob  09/09/2012   *RADIOLOGY REPORT*  Clinical Data: Right adnexal pain.  Question torsion.  Prior ovarian cystectomy.  LMP 08/31/2012  TRANSABDOMINAL AND TRANSVAGINAL ULTRASOUND OF PELVIS Technique:  Both transabdominal and transvaginal ultrasound examinations of the pelvis were performed. Transabdominal technique was performed for global imaging of the pelvis including uterus, ovaries, adnexal regions, and pelvic cul-de-sac.  It was necessary to proceed with endovaginal exam following the transabdominal exam to visualize the myometrium, endometrium and adnexa.  Comparison:  None  Findings:  Uterus: Is anteverted and anteflexed and demonstrates a sagittal length of 8.2 cm, depth of  3.4 cm and width of 3.9 cm.  No focal mural abnormality is seen  Endometrium: Appears trilayered with a width of 8.5 mm.  No areas of focal thickening or heterogeneity are seen and this would correlate with a periovulatory endometrium and corresponds with the provided LMP of 08/31/2012  Right ovary:  Measures 2.1 x 1.1 x 2.2 cm and has a normal appearance.  Intrastromal flow is seen with color Doppler assessment  Left ovary: Measures 2.5 x 2.1 x 2.5 cm and contains a dominant follicle  Other findings: No pelvic fluid or separate adnexal masses are seen.  DOPPLER ULTRASOUND OF OVARIES  Technique:  Color and duplex Doppler ultrasound was utilized to evaluate blood flow to the ovaries.  Findings: Normal arterial and venous flow was seen with pulsed doppler exam in both ovaries.  IMPRESSION: Normal periovulatory pelvic ultrasound. No sonographic features worrisome for torsion seen.   Original Report Authenticated By: Rhodia Albright, M.D.   US Pelvis Complete  09/09/2012   *RADIOLOGY REPORT*  Clinical Data: Right adnexal pain.  Question torsion.  Prior ovarian cystectomy.  LMP 08/31/2012  TRANSABDOMINAL AND TRANSVAGINAL ULTRASOUND OF PELVIS Technique:  Both transabdominal and transvaginal ultrasound examinations of the pelvis were performed. Transabdominal technique was performed for global imaging of the pelvis including uterus, ovaries, adnexal regions, and pelvic cul-de-sac.  It was necessary to proceed with endovaginal exam following the transabdominal exam to visualize the myometrium, endometrium and adnexa.  Comparison:  None  Findings:  Uterus: Is anteverted and anteflexed and demonstrates a sagittal length of 8.2 cm, depth of 3.4 cm and width of 3.9 cm.  No focal mural abnormality is seen  Endometrium: Appears trilayered with a width of 8.5 mm.  No areas of focal thickening or heterogeneity are seen and this would correlate with a periovulatory endometrium and corresponds with the provided LMP of 08/31/2012   Right ovary:  Measures 2.1 x 1.1 x 2.2 cm and has a normal appearance.  Intrastromal flow is seen with color Doppler assessment  Left ovary: Measures 2.5 x 2.1 x 2.5 cm and contains a dominant follicle  Other findings: No pelvic fluid or separate adnexal masses are seen.  DOPPLER ULTRASOUND OF OVARIES  Technique:  Color and duplex Doppler ultrasound was utilized to evaluate blood flow to the ovaries.  Findings: Normal arterial and venous flow was seen with pulsed doppler exam in both ovaries.  IMPRESSION: Normal periovulatory pelvic ultrasound. No sonographic features worrisome for torsion seen.   Original Report Authenticated By: Rhodia Albright, M.D.   Ct Abdomen Pelvis W Contrast  09/09/2012   *RADIOLOGY REPORT*  Clinical Data: Severe right-sided low back pain.  Nausea and vomiting.  CT ABDOMEN AND PELVIS WITH CONTRAST  Technique:  Multidetector CT imaging of the abdomen and pelvis was performed following the standard protocol during bolus administration of intravenous contrast.  Contrast: OMNIPAQUE IOHEXOL 300 MG/ML  SOLN  Comparison: CT abdomen and pelvis 12/09/2008.  Findings: Dependent basilar atelectasis is identified.  No pleural or pericardial effusion.  The patient has a lap band in place.  The device appears intact. The liver, gallbladder, adrenal glands, spleen, pancreas and kidneys all appear normal.  The appendix is dilated with periappendiceal inflammatory change consistent with acute appendicitis.  No abscess or perforation is identified.  The stomach and small and large bowel appear normal. No lymphadenopathy is seen.  No focal bony abnormality.  IMPRESSION: Study is positive for acute appendicitis without abscess or perforation.  Critical Value/emergent results were called by telephone at the time of interpretation on 09/09/2012 at 3:35 p.m. to Berenice Primas, who verbally acknowledged these results.   Original Report Authenticated By: Holley Dexter, M.D.   US Renal  09/09/2012     *RADIOLOGY REPORT*  Clinical Data: Abdominal pain and vomiting.  Flank pain  RENAL/URINARY TRACT ULTRASOUND COMPLETE  Comparison:  CT abdomen 12/09/2008  Findings:  Right Kidney:  11.4 cm in length.  Negative for obstruction or mass.  Renal cortex appears normal.  Left Kidney:  12.4 cm in length.  Negative for obstruction or mass. Renal cortex appears normal.  Bladder:  Negative  IMPRESSION: Negative   Original Report Authenticated By: Janeece Riggers, M.D.    Medications: . levothyroxine  150 mcg Oral BID AC  . piperacillin-tazobactam (ZOSYN)  IV  3.375 g Intravenous Q8H    Assessment/Plan Acute Appendicitis S/p laparoscopic Appendectomy, 09/09/2012,Paul Lovette Cliche, MD 2. S/p lap band placement Dr. Daphine Deutscher, 1997, with revision 01/19/13.  3.Hypothyroidism  4.Hx of fibromyalgia-- related to back pain.  5. Hx of sleep apnea, not using any device at home.  6. Hx of 2 prior C Section.     Plan:  Advance diet, mobilize and tramadol for pain.  Hopefully home later today.   LOS: 1 day    Shondra Capps 09/10/2012

## 2012-09-10 NOTE — Discharge Summary (Signed)
Physician Discharge Summary  Patient ID: Sally [name] MRN: [id number] DOB/AGE: [birth year] 37 y.o.  Admit date: 09/09/2012 Discharge date: 09/10/2012  Admission Diagnoses: Acute Appendicitis 2. S/p lap band placement Dr. Daphine Deutscher, 1997, with revision 01/19/13.  3.Hypothyroidism  4.Hx of fibromyalgia-- related to back pain.  5. Hx of sleep apnea, not using any device at home.  6. Hx of 2 prior C Section.  Discharge Diagnoses: Same Principal Problem:   Acute appendicitis Active Problems:   Hypothyroid   Back pain   PROCEDURES: S/p laparoscopic Appendectomy, 09/09/2012,Paul Billey Chang III, MD   Hospital Course: Pt reports she was fine yesterday and awoke with abdominal pain in RLQ about 6 AM this morning. She reports significant pain not relieved till she came to the ER. She continues to have pain, and nausea; vomiting is better. She has not eaten since last night, except for Contrast for CT scan this afternoon. Work up shows a normal CMP, WBC is elevated, H/H is elevated.  Ct scan shows a dilated appendix with inflammatory changes, no abscess or perforation noted. She also has a Lap Band in place. She reports about 100 pound loss back in2009 when she had it placed. She has had a 40 pound gain since her last pregnancy last year. She has had two children with C Sections. Renal ultrasound, and transvaginal US were both reported normal. Urine pregnancy study was normal.  She was seen by Dr. Carolynne Edouard and taken to the OR that afternoon.  She has done well post op and wants to go home now. Incisions look good.  She has a history of using toradol for pain so we are sending her home on this.   Condition on d/c:  Improved   Disposition: 01-Home or Self Care   Future Appointments Provider Department Dept Phone   10/05/2012 2:15 PM [facility] Surgery, [place] [phone]       Medication List         acetaminophen 325 MG tablet  Commonly known as:  TYLENOL  Take 2  tablets (650 mg total) by mouth every 6 (six) hours as needed.     levothyroxine 150 MCG tablet  Commonly known as:  SYNTHROID, LEVOTHROID  Take 150 mcg by mouth 2 (two) times daily.     loratadine 10 MG tablet  Commonly known as:  CLARITIN  Take 10 mg by mouth daily as needed for allergies.     tetrahydrozoline-zinc 0.05-0.25 % ophthalmic solution  Commonly known as:  VISINE-AC  Place 2 drops into both eyes as needed. For allergies     traMADol 50 MG tablet  Commonly known as:  ULTRAM  Take 1-2 tablets (50-100 mg total) by mouth every 6 (six) hours as needed for pain.     traMADol 50 MG tablet  Commonly known as:  ULTRAM  Take 50 mg by mouth every 6 (six) hours as needed for pain.           Follow-up Information   Follow up with [facility] [place] On 10/05/2012. (Your appointment is at 2:15 PM be there at 1:45 for check in.)    Contact information:   [address]   [place] [address] [phone]       Call Herb Grays, MD. (Let her know you had surgery and follow up on any medical issues.)    Specialty:  Family Medicine   Contact information:   [address]  [address] [place] [address] [phone]       Signed: Sherrie George 09/10/2012, 11:38 AM

## 2012-09-10 NOTE — Discharge Summary (Signed)
Discharge today.  Sally [name]. Corliss Skains, MD, [facility] Surgery  General/ Trauma Surgery  09/10/2012 11:47 AM

## 2012-09-10 NOTE — ED Provider Notes (Signed)
Medical screening examination/treatment/procedure(s) were performed by non-physician practitioner and as supervising physician I was immediately available for consultation/collaboration.   Gwyneth Sprout, MD 09/10/12 1019

## 2012-09-10 NOTE — Progress Notes (Signed)
Feeling better. Sore, but pain controlled Tolerated breakfast  Discharge home.  Wilmon Arms. Corliss Skains, MD, [facility] Surgery  General/ Trauma Surgery  09/10/2012 11:36 AM

## 2012-09-10 NOTE — Progress Notes (Signed)
Discharge patient home. Home discharge instruction given, no questions verbalized.

## 2012-09-13 ENCOUNTER — Telehealth (INDEPENDENT_AMBULATORY_CARE_PROVIDER_SITE_OTHER): Payer: Self-pay | Admitting: *Deleted

## 2012-09-13 NOTE — Telephone Encounter (Signed)
Patient called to request a letter to be excused from jury duty tomorrow.  Patient had emergency surgery on 09/09/12.  Patient is still taking her narcotics to help with pain.  Patient requests letter be sent to [phone].  Letter done at this time and faxed to patient.

## 2012-09-16 ENCOUNTER — Telehealth (INDEPENDENT_AMBULATORY_CARE_PROVIDER_SITE_OTHER): Payer: Self-pay | Admitting: General Surgery

## 2012-09-16 NOTE — Telephone Encounter (Signed)
Pt called to ask about lethargy/ tiredness she has following her lap appe last week.  She returned to work, half-days, on Monday.  Reassured pt that although she cannot see the surgery that was performed, she is still recovering from it.  Recommended increased rest, improved nutrition and hydration and good bowel regimen.  Advised her to limit lifting, carrying, pushing or pulling > 20 lbs.  She understands all and will call back prn.

## 2012-09-24 ENCOUNTER — Telehealth (INDEPENDENT_AMBULATORY_CARE_PROVIDER_SITE_OTHER): Payer: Self-pay | Admitting: *Deleted

## 2012-09-24 NOTE — Telephone Encounter (Signed)
Patient called to report that she has been running a low grade fever 100F oral for a couple days and then today just doesn't feel well.  Patient had a lap appendectomy on 09/09/12.  Patient also states decreased appetite beginning around lunch time yesterday.  Explained to patient that a message will be sent to Dr. Carolynne Edouard to ask for his opinion but with her surgery being 2 weeks ago and the symptoms just starting she may want to call her PMD to see if something else may be going on.  Patient denies signs or symptoms of infection at incision sites and no abdominal pain.  Patient is agreeable to call PMD to see if she can get in to see them today. Explained that if Dr. Carolynne Edouard has any suggestions then I will let her know.  Patient states understanding and agreeable at this time.

## 2012-09-24 NOTE — Telephone Encounter (Signed)
i will be happy to see pt next week. If she has problems over the weekend she may need to go to ER

## 2012-09-24 NOTE — Telephone Encounter (Signed)
Called and spoke to patient.  She stated she was unable to get in with her PMD today.  Encouraged patient to treat this as a cold over the weekend with Tylenol, plenty of fluid, and high protein diet.  Offered patient appt to see Dr. Carolynne Edouard on Monday however she states she has to take her son to ENT at that time, so made appt for Tuesday morning for patient.  Patient is agreeable to call on Monday if she is feeling better and she believes it is just a cold.  Patient states understanding of plan and agreeable at this time.

## 2012-09-28 ENCOUNTER — Encounter (INDEPENDENT_AMBULATORY_CARE_PROVIDER_SITE_OTHER): Payer: Self-pay | Admitting: General Surgery

## 2012-09-28 ENCOUNTER — Telehealth: Payer: Self-pay | Admitting: Obstetrics & Gynecology

## 2012-09-28 ENCOUNTER — Ambulatory Visit (INDEPENDENT_AMBULATORY_CARE_PROVIDER_SITE_OTHER): Payer: BC Managed Care – PPO | Admitting: General Surgery

## 2012-09-28 VITALS — BP 138/78 | HR 76 | Temp 97.1°F | Resp 14 | Ht 67.5 in | Wt 264.6 lb

## 2012-09-28 DIAGNOSIS — K3580 Unspecified acute appendicitis: Secondary | ICD-10-CM

## 2012-09-28 LAB — CBC WITH DIFFERENTIAL/PLATELET
Eosinophils Relative: 5 % (ref 0–5)
HCT: 44.2 % (ref 36.0–46.0)
Lymphocytes Relative: 43 % (ref 12–46)
Lymphs Abs: 4 10*3/uL (ref 0.7–4.0)
MCV: 90.8 fL (ref 78.0–100.0)
Monocytes Absolute: 0.8 10*3/uL (ref 0.1–1.0)
RBC: 4.87 MIL/uL (ref 3.87–5.11)
RDW: 13.2 % (ref 11.5–15.5)
WBC: 9.3 10*3/uL (ref 4.0–10.5)

## 2012-09-28 LAB — BASIC METABOLIC PANEL
BUN: 14 mg/dL (ref 6–23)
CO2: 27 mEq/L (ref 19–32)
Chloride: 106 mEq/L (ref 96–112)
Creat: 0.74 mg/dL (ref 0.50–1.10)

## 2012-09-28 NOTE — Patient Instructions (Signed)
Will get CT abdomen and pelvis and call with the results

## 2012-09-28 NOTE — Telephone Encounter (Signed)
Patient calling to see if her thyroid levels were checked when she last had blood work done? If so patient is requesting a refill on her thyroid RX.  [facility] [address] [facility] [phone]

## 2012-09-29 ENCOUNTER — Ambulatory Visit
Admission: RE | Admit: 2012-09-29 | Discharge: 2012-09-29 | Disposition: A | Discharge status: Home / Self Care | Payer: BC Managed Care – PPO | Source: Ambulatory Visit | Attending: General Surgery | Admitting: General Surgery

## 2012-09-29 DIAGNOSIS — K3580 Unspecified acute appendicitis: Secondary | ICD-10-CM

## 2012-09-29 MED ORDER — IOHEXOL 300 MG/ML  SOLN
125.0000 mL | Freq: Once | INTRAMUSCULAR | Status: AC | PRN
  Administered 2012-09-29: 125 mL via INTRAVENOUS

## 2012-09-29 NOTE — Telephone Encounter (Signed)
Last AEX 06-18-12 with MSM Reported that PCP managed thyroid levels Has not seen PCP recently and they will not refill to labs done. Patient just had appendectomy 09-09-12. Notes in EPIC.  Advised since no recent TSH would agree needs to be seen (esp with recent surgery).  Can be seen here if desires.  States PCP is closer so will go there.

## 2012-09-30 ENCOUNTER — Telehealth (INDEPENDENT_AMBULATORY_CARE_PROVIDER_SITE_OTHER): Payer: Self-pay

## 2012-09-30 DIAGNOSIS — G8918 Other acute postprocedural pain: Secondary | ICD-10-CM

## 2012-09-30 MED ORDER — HYDROCODONE-ACETAMINOPHEN 5-325 MG PO TABS: 1.0000 | ORAL_TABLET | Freq: Four times a day (QID) | ORAL | Status: DC | PRN

## 2012-09-30 NOTE — Telephone Encounter (Signed)
Pt called for CT result. Pt advised of CT result and would like call back with recommendations. She states lower abd discomfort is the same as when CT was ordered. Discomfort sharp at times. Comes and goes. No other symptoms. I advised pt I will send msg to Dr Carolynne Edouard and Marcelino Duster to view CT and call pt with recommendations. Pt can be reached at [phone].

## 2012-09-30 NOTE — Addendum Note (Signed)
Addended by: Brennan Bailey on: 09/30/2012 01:28 PM   Modules accepted: Orders

## 2012-09-30 NOTE — Telephone Encounter (Signed)
No obvious source for her pain on CT. I would treat her symptoms and hopefully it will resolve. If she needs some pain medicine we can do that. If she is not having a bm everyday then she should take a stool softener and miralax

## 2012-09-30 NOTE — Telephone Encounter (Signed)
Called pt and read the below message from Dr Carolynne Edouard. [facility] [address].

## 2012-10-04 NOTE — Progress Notes (Signed)
Subjective:     Patient ID: Sally [name], female   DOB: 1975-11-05, 37 y.o.   MRN: [id number]  HPI The patient is a 37 year old white female who is about 3 weeks status post laparoscopic appendectomy for acute appendicitis. She initially did well but over the last few days has been experiencing some low-grade fevers. She has noticed a stabbing or burning pain on her right lower quadrant below where her port is. She denies any nausea or vomiting but does not have much of an appetite.  Review of Systems     Objective:   Physical Exam On exam her abdomen is soft with some mild to moderate tenderness focally in the right lower quadrant. There is no guarding or peritonitis. The port site from her gastric banding looks good.    Assessment:     The patient is 3 weeks status post laparoscopic appendectomy and now has pain and low-grade fever     Plan:     At this point I would plan to check her white count. I wore a CT scan of her abdomen and pelvis to look for an abscess or complication of her appendectomy. We were call her with the results of the studies and then proceed accordingly

## 2012-10-05 ENCOUNTER — Encounter (INDEPENDENT_AMBULATORY_CARE_PROVIDER_SITE_OTHER): Payer: BC Managed Care – PPO

## 2012-11-25 ENCOUNTER — Other Ambulatory Visit: Payer: Self-pay

## 2013-09-04 ENCOUNTER — Ambulatory Visit (INDEPENDENT_AMBULATORY_CARE_PROVIDER_SITE_OTHER): Payer: BC Managed Care – PPO | Admitting: Family Medicine

## 2013-09-04 VITALS — BP 126/82 | HR 86 | Temp 97.9°F | Resp 16 | Ht 68.25 in | Wt 270.6 lb

## 2013-09-04 DIAGNOSIS — E039 Hypothyroidism, unspecified: Secondary | ICD-10-CM

## 2013-09-04 DIAGNOSIS — L678 Other hair color and hair shaft abnormalities: Secondary | ICD-10-CM

## 2013-09-04 DIAGNOSIS — R609 Edema, unspecified: Secondary | ICD-10-CM

## 2013-09-04 DIAGNOSIS — L739 Follicular disorder, unspecified: Secondary | ICD-10-CM

## 2013-09-04 DIAGNOSIS — IMO0001 Reserved for inherently not codable concepts without codable children: Secondary | ICD-10-CM

## 2013-09-04 DIAGNOSIS — M797 Fibromyalgia: Secondary | ICD-10-CM

## 2013-09-04 DIAGNOSIS — L738 Other specified follicular disorders: Secondary | ICD-10-CM

## 2013-09-04 LAB — TSH: TSH: 0.72 u[IU]/mL (ref 0.350–4.500)

## 2013-09-04 MED ORDER — LEVOTHYROXINE SODIUM 150 MCG PO TABS: 150.0000 ug | ORAL_TABLET | Freq: Two times a day (BID) | ORAL | Status: DC

## 2013-09-04 MED ORDER — TRAMADOL HCL 50 MG PO TABS: 50.0000 mg | ORAL_TABLET | Freq: Four times a day (QID) | ORAL | Status: DC | PRN

## 2013-09-04 MED ORDER — TRIAMTERENE-HCTZ 37.5-25 MG PO TABS: 1.0000 | ORAL_TABLET | Freq: Every day | ORAL | Status: DC

## 2013-09-04 MED ORDER — DOXYCYCLINE HYCLATE 100 MG PO TABS: 100.0000 mg | ORAL_TABLET | Freq: Two times a day (BID) | ORAL | Status: DC

## 2013-09-04 NOTE — Progress Notes (Signed)
Subjective:    Patient ID: Sally [name], female    DOB: [birth year], 38 y.o.   MRN: [id number] This chart was scribed for Robyn Haber, MD by Steva Colder, ED Scribe. The patient was seen in room 13 at 10:58 AM.   Chief Complaint  Patient presents with  . Leg Swelling    bilateral  . Medication Refill    tramadol,levothyroxine    HPI HPI Comments: Sally [name] is a 38 y.o. female with a medical hx of hypothyroid who presents today complaining of bilateral leg swelling onset 1-2 weeks. She states that the right leg is swollen more than the other leg. She denies any issues with her left calf.  She states that her right calf is having some pain. She states that she is having associated symptoms of frequency and nocturia. She denies any other associated symptoms. 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 She states that she takes Ultram and Levothyroxine for her fibromyalgia. She states that she is no longer taking Lexapro, she was taking off that when she was pregnant and never started back on it. She states that she no longer takes Norco. She states that she is looking for a new PCP. She states that her thyroid has not been working properly since she gave birth to her first child, 18 years ago.    Patient Active Problem List   Diagnosis Date Noted  . Acute appendicitis 09/09/2012  . Hypothyroid 09/09/2012  . Back pain 09/09/2012  . 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 Pelvic pain in female 08/19/2010   Past Medical History  Diagnosis Date  . Hypothyroidism   . Anxiety   . Fibromyalgia   . Sleep apnea     pre lap banding-never used cpap or mask  . Back pain     Occ. issues wih back pain  . Acute appendicitis 09/09/2012  . Hypothyroid 09/09/2012  . Back pain 09/09/2012    It is intermittent now,  previously source of fibromyalgia dx.     Past Surgical History  Procedure Laterality Date  . Cesarean section  1997, 2013  . Laparoscopic gastric banding  1997  . Knee  surgery    . Laparoscopy  08/19/2010    Procedure: LAPAROSCOPY OPERATIVE;  Surgeon: Felipa Emory;  Location: [facility];  Service: Gynecology;  Laterality: N/A;  with Biopsy of uterine serosa  . Tubal ligation      8/13  . Gastric banding port revision  01/20/2012    Procedure: GASTRIC BANDING PORT REVISION;  Surgeon: Pedro Earls, MD;  Location: [facility];  Service: General;  Laterality: N/A;  . Laparoscopic appendectomy N/A 09/09/2012    Procedure: APPENDECTOMY LAPAROSCOPIC;  Surgeon: Merrie Roof, MD;  Location: [facility];  Service: General;  Laterality: N/A;   Allergies  Allergen Reactions  . Paroxetine Hcl Rash  . Percocet [Oxycodone-Acetaminophen] Itching  . Codeine Other (See Comments)    Puts patient to sleep "knocks her out" for at least two days, regardless of dosage.   Prior to Admission medications   Medication Sig Start Date End Date Taking? Authorizing Provider  acetaminophen (TYLENOL) 325 MG tablet Take 2 tablets (650 mg total) by mouth every 6 (six) hours as needed. 09/10/12  Yes Earnstine Regal, PA-C  levothyroxine (SYNTHROID, LEVOTHROID) 150 MCG tablet Take 150 mcg by mouth 2 (two) times daily.   Yes Historical Provider, MD  traMADol (ULTRAM) 50 MG tablet Take 1-2 tablets (50-100 mg total) by  mouth every 6 (six) hours as needed for pain. 09/10/12  Yes Earnstine Regal, PA-C  escitalopram (LEXAPRO) 20 MG tablet  09/21/12   Historical Provider, MD  HYDROcodone-acetaminophen (NORCO) 5-325 MG per tablet Take 1 tablet by mouth every 6 (six) hours as needed for pain. 09/30/12   Luella Cook III, MD  loratadine (CLARITIN) 10 MG tablet Take 10 mg by mouth daily as needed for allergies.    Historical Provider, MD  tetrahydrozoline-zinc (VISINE-AC) 0.05-0.25 % ophthalmic solution Place 2 drops into both eyes as needed. For allergies    Historical Provider, MD      Review of Systems     Objective:   Physical Exam  Nursing note and vitals reviewed. Constitutional: She is oriented  to person, place, and time. She appears well-developed and well-nourished. No distress.  HENT:  Head: Normocephalic and atraumatic.  Eyes: EOM are normal.  Neck: Neck supple. No tracheal deviation present.  Cardiovascular: Normal rate.   Pulmonary/Chest: Effort normal. No respiratory distress.  Musculoskeletal: Normal range of motion.  Neurological: She is alert and oriented to person, place, and time.  Skin: Skin is warm and dry.  Psychiatric: She has a normal mood and affect. Her behavior is normal.         BP 126/82  Pulse 86  Temp(Src) 97.9 F (36.6 C) (Oral)  Resp 16  Ht 5' 8.25" (1.734 m)  Wt 270 lb 9.6 oz (122.743 kg)  BMI 40.82 kg/m2  SpO2 97%  LMP 08/10/2013  Assessment & Plan:  DIAGNOSTIC STUDIES: Oxygen Saturation is 97% on room air , normal by my interpretation.    COORDINATION OF CARE: 11:09 AM-Discussed treatment plan which includes a refill of Tramadol and Levothyroxine, a refill of a , Abx, and Thyroid level check with pt at bedside and pt agreed to plan.    1. Unspecified hypothyroidism     I personally performed the services described in this documentation, which was scribed in my presence. The recorded information has been reviewed and is accurate.  Unspecified hypothyroidism - Plan: levothyroxine (SYNTHROID, LEVOTHROID) 150 MCG tablet, TSH  Fibromyalgia - Plan: traMADol (ULTRAM) 50 MG tablet, doxycycline (VIBRA-TABS) 465 MG tablet  Folliculitis  Edema - Plan: triamterene-hydrochlorothiazide (MAXZIDE-25) 37.5-25 MG per tablet  Signed, Robyn Haber, MD

## 2013-09-23 ENCOUNTER — Ambulatory Visit (INDEPENDENT_AMBULATORY_CARE_PROVIDER_SITE_OTHER): Payer: BC Managed Care – PPO | Admitting: Obstetrics & Gynecology

## 2013-09-23 ENCOUNTER — Encounter: Payer: Self-pay | Admitting: Obstetrics & Gynecology

## 2013-09-23 VITALS — BP 118/80 | HR 80 | Resp 16 | Ht 67.5 in | Wt 273.6 lb

## 2013-09-23 DIAGNOSIS — N9489 Other specified conditions associated with female genital organs and menstrual cycle: Secondary | ICD-10-CM

## 2013-09-23 DIAGNOSIS — Z0000 Encounter for general adult medical examination without abnormal findings: Secondary | ICD-10-CM

## 2013-09-23 DIAGNOSIS — Z01419 Encounter for gynecological examination (general) (routine) without abnormal findings: Secondary | ICD-10-CM

## 2013-09-23 DIAGNOSIS — N393 Stress incontinence (female) (male): Secondary | ICD-10-CM

## 2013-09-23 LAB — HEMOGLOBIN, FINGERSTICK: HEMOGLOBIN, FINGERSTICK: 15.9 g/dL (ref 12.0–16.0)

## 2013-09-23 NOTE — Progress Notes (Signed)
38 y.o. [id number] MarriedCaucasianF here for annual exam.  Last year she reported pain with intercourse that started after her cesarean section.  That has continued.  Reports, as well, urinary and fecal incontinence as well as increased urinary frequency.  Referred to PT last year.  Did not end up going as son needed tubes in ears and then had appendectomy.    Blood work up to date from surgery last year and TSH in August.  Patient's last menstrual period was 09/12/2013.          Sexually active: Yes.    The current method of family planning is tubal ligation.    Exercising: No.  not regularly Smoker:  Yes 1PPD  Health Maintenance: Pap:  06/18/12 WNL/negative HR HPV History of abnormal Pap:  yes MMG:  Years ago for lump Colonoscopy:  none BMD:   10/23/05 TDaP:  Will check with Dr Modena Morrow Screening Labs: TSH 8/15, Hb today: 15.9, Urine today: negative   reports that she has been smoking Cigarettes.  She has been smoking about 1.00 pack per day. She has never used smokeless tobacco. She reports that she does not drink alcohol or use illicit drugs.  Past Medical History  Diagnosis Date  . Hypothyroidism   . Anxiety   . Fibromyalgia   . Sleep apnea     pre lap banding-never used cpap or mask  . Back pain     Occ. issues wih back pain  . Acute appendicitis 09/09/2012  . Hypothyroid 09/09/2012  . Back pain 09/09/2012    It is intermittent now,  previously source of fibromyalgia dx.      Past Surgical History  Procedure Laterality Date  . Cesarean section  1997, 2013  . Laparoscopic gastric banding  1997  . Knee surgery    . Laparoscopy  08/19/2010    Procedure: LAPAROSCOPY OPERATIVE;  Surgeon: Felipa Emory;  Location: [facility];  Service: Gynecology;  Laterality: N/A;  with Biopsy of uterine serosa  . Tubal ligation      8/13  . Gastric banding port revision  01/20/2012    Procedure: GASTRIC BANDING PORT REVISION;  Surgeon: Pedro Earls, MD;  Location: [facility];  Service: General;   Laterality: N/A;  . Laparoscopic appendectomy N/A 09/09/2012    Procedure: APPENDECTOMY LAPAROSCOPIC;  Surgeon: Merrie Roof, MD;  Location: [facility];  Service: General;  Laterality: N/A;    Current Outpatient Prescriptions  Medication Sig Dispense Refill  . acetaminophen (TYLENOL) 325 MG tablet Take 2 tablets (650 mg total) by mouth every 6 (six) hours as needed.      . doxycycline (VIBRA-TABS) 100 MG tablet Take 1 tablet (100 mg total) by mouth 2 (two) times daily.  20 tablet  0  . levothyroxine (SYNTHROID, LEVOTHROID) 150 MCG tablet Take 1 tablet (150 mcg total) by mouth 2 (two) times daily.  180 tablet  3  . loratadine (CLARITIN) 10 MG tablet Take 10 mg by mouth daily as needed for allergies.      Marland Kitchen traMADol (ULTRAM) 50 MG tablet Take 1-2 tablets (50-100 mg total) by mouth every 6 (six) hours as needed.  40 tablet  0  . buPROPion (WELLBUTRIN) 100 MG tablet 100 mg daily.      Marland Kitchen PROAIR HFA 108 (90 BASE) MCG/ACT inhaler as needed.       No current facility-administered medications for this visit.    Family History  Problem Relation Age of Onset  . Hypertension Maternal Grandfather   .  Diabetes Paternal Grandmother   . Anesthesia problems Neg Hx     ROS:  Pertinent items are noted in HPI.  Otherwise, a comprehensive ROS was negative.  Exam:   BP 118/80  Pulse 80  Resp 16  Ht 5' 7.5" (1.715 m)  Wt 273 lb 9.6 oz (124.104 kg)  BMI 42.19 kg/m2  LMP 09/12/2013  Weight change: +2#   Height: 5' 7.5" (171.5 cm)  Ht Readings from Last 3 Encounters:  09/23/13 5' 7.5" (1.715 m)  09/04/13 5' 8.25" (1.734 m)  09/28/12 5' 7.5" (1.715 m)    General appearance: alert, cooperative and appears stated age Head: Normocephalic, without obvious abnormality, atraumatic Neck: no adenopathy, supple, symmetrical, trachea midline and thyroid normal to inspection and palpation Lungs: clear to auscultation bilaterally Breasts: normal appearance, no masses or tenderness Heart: regular rate and  rhythm Abdomen: soft, non-tender; bowel sounds normal; no masses,  no organomegaly Extremities: extremities normal, atraumatic, no cyanosis or edema Skin: Skin color, texture, turgor normal. No rashes or lesions Lymph nodes: Cervical, supraclavicular, and axillary nodes normal. No abnormal inguinal nodes palpated Neurologic: Grossly normal   Pelvic: External genitalia:  no lesions              Urethra:  normal appearing urethra with no masses, tenderness or lesions              Bartholins and Skenes: normal                 Vagina: normal appearing vagina with normal color and discharge, no lesions              Cervix: no lesions              Pap taken: No. Bimanual Exam:  Uterus:  normal size, contour, position, consistency, mobility, non-tender              Adnexa: normal adnexa and no mass, fullness, tenderness               Rectovaginal: Confirms               Anus:  normal sphincter tone, no lesions  A:  Well Woman with normal exam  S/P Cesarean section 09/19/11  Pelvic floor pain and dyspareunia and mild fecal incontinence since delivery  One month of heavy cycle H/O abnormal Pap  H/o Lap band.  No weight loss over last year. H/O hypothyroidism  H/O BLT  P: pap smear and HR HPV today 2014.  No pap today Referral to [facility], for pelvic PT.  Done last year but pt didn't go.  States she will this year.  Sees PCP for thyroid monitoring  Return for PUS return annually or prn  An After Visit Summary was printed and given to the patient.

## 2013-09-24 DIAGNOSIS — N393 Stress incontinence (female) (male): Secondary | ICD-10-CM | POA: Insufficient documentation

## 2013-09-28 ENCOUNTER — Telehealth: Payer: Self-pay | Admitting: Obstetrics & Gynecology

## 2013-09-28 NOTE — Telephone Encounter (Signed)
Spoke with patient. Advised that per benefit quote received, she will be responsible for $35 copay when she comes in for PUS. Patient agreeable. Scheduled PUS. Advised patient of 72 hour cancellation policy and $147 cancellation fee. Patient agreeable.

## 2013-10-04 ENCOUNTER — Ambulatory Visit (INDEPENDENT_AMBULATORY_CARE_PROVIDER_SITE_OTHER): Payer: BC Managed Care – PPO

## 2013-10-04 ENCOUNTER — Ambulatory Visit (INDEPENDENT_AMBULATORY_CARE_PROVIDER_SITE_OTHER): Payer: BC Managed Care – PPO | Admitting: Obstetrics & Gynecology

## 2013-10-04 VITALS — BP 102/78 | Ht 67.5 in | Wt 276.8 lb

## 2013-10-04 DIAGNOSIS — IMO0002 Reserved for concepts with insufficient information to code with codable children: Secondary | ICD-10-CM

## 2013-10-04 DIAGNOSIS — N9489 Other specified conditions associated with female genital organs and menstrual cycle: Secondary | ICD-10-CM

## 2013-10-04 DIAGNOSIS — N393 Stress incontinence (female) (male): Secondary | ICD-10-CM

## 2013-10-04 DIAGNOSIS — R159 Full incontinence of feces: Secondary | ICD-10-CM

## 2013-10-04 NOTE — Progress Notes (Signed)
  38 y.o. [id number] MarriedCaucasianF here for PUS due to pain with intercourse that started after her cesarean section as well as increased urinary incontinence and new onset (since pregnancy) of fecal incontinence.  Cycles fairly regular.  Had BTL with c/section.   Patient's last menstrual period was 09/12/2013.  Sexually active:  yes  Contraception: bilateral tubal ligation  FINDINGS: UTERUS: 7.9 x 3.6 x 3.5cm (difficult to image) with very prominent cesarean section scar anterio ly.  Pt is tender in area of scarring. EMS: 5.62mm ADNEXA:   Left ovary 2.7 x 1.3 x 1.1cm   Right ovary 2.8 x 2.3 x 2.1cm with 1.5cm resolving corpus luteal cyst CUL DE SAC: small amount of free fluid in anterior cul de sac  Images reviewed with patient and results discussed.  I really do feel that the change in pain and dyspareunia she has experienced since her delivery is due to scarring/adhesions from her hysterectomy.  Unfortunately, the only real way to figure this out would be to perform a laparoscopy and she is not interested in that right now.  Also, pt may end up requiring hysterectomy to resolve pain.  Pt also not really interested in that right now as she doesn't feel she has the time for recovery.  Referral to PT has been made for incontinence issues.  Pt may ultimately need surgical correction for urinary incontinence.  As last pregnancy was delivered via repeat Cesarean section, feel PT really will help fecal incontinence, if she will just go!!  She voiced clear understanding and states she will go to at least a few visits.  Assessment: Dyspareunia Urinary and fecal incontinence  Plan: Referral to [facility] has been made.  Asked pt to give me some feedback after she has done a few visits.  May need laparoscopy for diagnosis or hysterectomy for treatment of pain.  Of course would proceed with urodynamics prior to any surgery for treatment of incontinence if PT doesn't help.  ~20 minutes spent with  patient >50% of time was in face to face discussion of above.

## 2013-10-05 ENCOUNTER — Telehealth: Payer: Self-pay | Admitting: Obstetrics & Gynecology

## 2013-10-05 NOTE — Telephone Encounter (Signed)
Message copied by Laqueta Due on Wed Oct 05, 2013 10:39 AM ------      Message from: [name]      Created: Tue Oct 04, 2013  5:03 PM      Regarding: PT referral to [facility]       Mrs. Donoso hasn't heard from [facility] about her appointment.  Can you give me an update about this?  Thanks.            MSM ------

## 2013-10-05 NOTE — Telephone Encounter (Signed)
Spoke with patient. Advised that she is scheduled at [facility] on 09.23.2015 @ 0945. Provided her with their address and telephone #. Patient agreeable

## 2013-10-09 ENCOUNTER — Encounter: Payer: Self-pay | Admitting: Obstetrics & Gynecology

## 2013-10-09 DIAGNOSIS — R159 Full incontinence of feces: Secondary | ICD-10-CM | POA: Insufficient documentation

## 2013-10-09 DIAGNOSIS — IMO0002 Reserved for concepts with insufficient information to code with codable children: Secondary | ICD-10-CM | POA: Insufficient documentation

## 2013-11-21 ENCOUNTER — Encounter: Payer: Self-pay | Admitting: Obstetrics & Gynecology

## 2013-12-14 ENCOUNTER — Encounter (HOSPITAL_COMMUNITY): Payer: Self-pay | Admitting: Emergency Medicine

## 2013-12-14 ENCOUNTER — Emergency Department (HOSPITAL_COMMUNITY): Payer: Worker's Compensation

## 2013-12-14 ENCOUNTER — Emergency Department (HOSPITAL_COMMUNITY)
Admission: EM | Admit: 2013-12-14 | Discharge: 2013-12-14 | Disposition: A | Discharge status: Home / Self Care | Payer: Worker's Compensation | Attending: Emergency Medicine | Admitting: Emergency Medicine

## 2013-12-14 DIAGNOSIS — S52121A Displaced fracture of head of right radius, initial encounter for closed fracture: Secondary | ICD-10-CM

## 2013-12-14 DIAGNOSIS — Z8719 Personal history of other diseases of the digestive system: Secondary | ICD-10-CM | POA: Insufficient documentation

## 2013-12-14 DIAGNOSIS — Y9289 Other specified places as the place of occurrence of the external cause: Secondary | ICD-10-CM | POA: Diagnosis not present

## 2013-12-14 DIAGNOSIS — Z792 Long term (current) use of antibiotics: Secondary | ICD-10-CM | POA: Insufficient documentation

## 2013-12-14 DIAGNOSIS — S52124A Nondisplaced fracture of head of right radius, initial encounter for closed fracture: Secondary | ICD-10-CM | POA: Diagnosis not present

## 2013-12-14 DIAGNOSIS — E039 Hypothyroidism, unspecified: Secondary | ICD-10-CM | POA: Insufficient documentation

## 2013-12-14 DIAGNOSIS — S80211A Abrasion, right knee, initial encounter: Secondary | ICD-10-CM | POA: Insufficient documentation

## 2013-12-14 DIAGNOSIS — R52 Pain, unspecified: Secondary | ICD-10-CM

## 2013-12-14 DIAGNOSIS — F419 Anxiety disorder, unspecified: Secondary | ICD-10-CM | POA: Diagnosis not present

## 2013-12-14 DIAGNOSIS — Z720 Tobacco use: Secondary | ICD-10-CM | POA: Insufficient documentation

## 2013-12-14 DIAGNOSIS — Y9389 Activity, other specified: Secondary | ICD-10-CM | POA: Diagnosis not present

## 2013-12-14 DIAGNOSIS — Z79899 Other long term (current) drug therapy: Secondary | ICD-10-CM | POA: Insufficient documentation

## 2013-12-14 DIAGNOSIS — Z8669 Personal history of other diseases of the nervous system and sense organs: Secondary | ICD-10-CM | POA: Insufficient documentation

## 2013-12-14 DIAGNOSIS — S52125A Nondisplaced fracture of head of left radius, initial encounter for closed fracture: Secondary | ICD-10-CM | POA: Diagnosis not present

## 2013-12-14 DIAGNOSIS — S59901A Unspecified injury of right elbow, initial encounter: Secondary | ICD-10-CM | POA: Diagnosis present

## 2013-12-14 DIAGNOSIS — S52122A Displaced fracture of head of left radius, initial encounter for closed fracture: Secondary | ICD-10-CM

## 2013-12-14 DIAGNOSIS — W010XXA Fall on same level from slipping, tripping and stumbling without subsequent striking against object, initial encounter: Secondary | ICD-10-CM | POA: Diagnosis not present

## 2013-12-14 DIAGNOSIS — Y990 Civilian activity done for income or pay: Secondary | ICD-10-CM | POA: Insufficient documentation

## 2013-12-14 MED ORDER — HYDROCODONE-ACETAMINOPHEN 5-325 MG PO TABS
2.0000 | ORAL_TABLET | Freq: Once | ORAL | Status: AC
  Administered 2013-12-14: 2 via ORAL
  Filled 2013-12-14: qty 2

## 2013-12-14 MED ORDER — HYDROCODONE-ACETAMINOPHEN 5-325 MG PO TABS: 2.0000 | ORAL_TABLET | ORAL | Status: DC | PRN

## 2013-12-14 NOTE — ED Notes (Signed)
Unable to attain BP d/t slings on both arms and splint on one arm. Patient ambulatory. Knows to follow up with orthopedic surgery ASAP-follow up information given. Knows not to drink/drive with prescribed medications. No other questions/concerns.

## 2013-12-14 NOTE — Discharge Instructions (Signed)
Return to the emergency room with worsening of symptoms, new symptoms or with symptoms that are concerning, especially fevers, redness, swelling, warmth to either elbow. Her numbness tingling, weakness, decreased feeling in your fingertips. RICE: Rest, Ice (three cycles of 20 mins on, 40mins off at least twice a day), compression/brace, elevation.  Ibuprofen 400mg  (2 tablets 200mg ) every 5-6 hours for 3-5 days and then as needed for pain. Norco for severe pain. Do not operate machinery, drive or drink alcohol while taking narcotics or muscle relaxers. Follow up with hand surgeon as soon as possible. Call to make appointment. Number provided above.  Limit movement of left arm during healing process.   Cast or Splint Care Casts and splints support injured limbs and keep bones from moving while they heal. It is important to care for your cast or splint at home.  HOME CARE INSTRUCTIONS  Keep the cast or splint uncovered during the drying period. It can take 24 to 48 hours to dry if it is made of plaster. A fiberglass cast will dry in less than 1 hour.  Do not rest the cast on anything harder than a pillow for the first 24 hours.  Do not put weight on your injured limb or apply pressure to the cast until your health care provider gives you permission.  Keep the cast or splint dry. Wet casts or splints can lose their shape and may not support the limb as well. A wet cast that has lost its shape can also create harmful pressure on your skin when it dries. Also, wet skin can become infected.  Cover the cast or splint with a plastic bag when bathing or when out in the rain or snow. If the cast is on the trunk of the body, take sponge baths until the cast is removed.  If your cast does become wet, dry it with a towel or a blow dryer on the cool setting only.  Keep your cast or splint clean. Soiled casts may be wiped with a moistened cloth.  Do not place any hard or soft foreign objects under your  cast or splint, such as cotton, toilet paper, lotion, or powder.  Do not try to scratch the skin under the cast with any object. The object could get stuck inside the cast. Also, scratching could lead to an infection. If itching is a problem, use a blow dryer on a cool setting to relieve discomfort.  Do not trim or cut your cast or remove padding from inside of it.  Exercise all joints next to the injury that are not immobilized by the cast or splint. For example, if you have a long leg cast, exercise the hip joint and toes. If you have an arm cast or splint, exercise the shoulder, elbow, thumb, and fingers.  Elevate your injured arm or leg on 1 or 2 pillows for the first 1 to 3 days to decrease swelling and pain.It is best if you can comfortably elevate your cast so it is higher than your heart. SEEK MEDICAL CARE IF:   Your cast or splint cracks.  Your cast or splint is too tight or too loose.  You have unbearable itching inside the cast.  Your cast becomes wet or develops a soft spot or area.  You have a bad smell coming from inside your cast.  You get an object stuck under your cast.  Your skin around the cast becomes red or raw.  You have new pain or worsening pain after  the cast has been applied. SEEK IMMEDIATE MEDICAL CARE IF:   You have fluid leaking through the cast.  You are unable to move your fingers or toes.  You have discolored (blue or white), cool, painful, or very swollen fingers or toes beyond the cast.  You have tingling or numbness around the injured area.  You have severe pain or pressure under the cast.  You have any difficulty with your breathing or have shortness of breath.  You have chest pain. Document Released: 01/04/2000 Document Revised: 10/27/2012 Document Reviewed: 07/15/2012 [facility] Patient Information 2015 [place], [place]. This information is not intended to replace advice given to you by your health care provider. Make sure you discuss any  questions you have with your health care provider.

## 2013-12-14 NOTE — ED Notes (Signed)
Pt states that she fell at work today at [employer].  Had xrays done at health center.  Rt elbow is fx.  Sent for splinting. Bilateral elbows hurt but only rt is fx.

## 2013-12-14 NOTE — ED Provider Notes (Signed)
CSN: [id number]     Arrival date & time 12/14/13  1718 History  This chart was scribed for non-physician practitioner, Pura Spice, PA-C, working with Malvin Johns, MD, by Delphia Grates, ED Scribe. This patient was seen in room WTR7/WTR7 and the patient's care was started at 5:40 PM.   Chief Complaint  Patient presents with  . Arm Pain    The history is provided by the patient. No language interpreter was used.     HPI Comments: Sally [name] is a 38 y.o. female who presents to the Emergency Department complaining of sudden onset, 7/10, bilateral arm pain onset PTA. Patient reports, that while at work today, she tripped and fell, landing on her bilateral arms and right knee. She denies head injury or LOC. Patient is employed at [employer] and had x-rays done at their health center which revealed positive findings for a right elbow fracture only. She was sent here for splinting of right elbow, however, she reports similar pain in left elbow as well, and notes the pain is worsened with flexion and extension and supination/pronation. There is associated weakmess to bilateral upper extremities, as well as bruising and abrasion to the right knee. Patient denies any pain with ambulation and is refusing knee imaging at this time. Patient has taken Tylenol for the pain. She also denies HA, slurred speech, visual changes, swelling of bilateral knees, nausea, vomiting, or any other injuries. She denies any chances of pregnancy. Patient has allergies to Percocet and codeine.    Past Medical History  Diagnosis Date  . Hypothyroidism   . Anxiety   . Fibromyalgia   . Sleep apnea     pre lap banding-never used cpap or mask  . Back pain     Occ. issues wih back pain  . Acute appendicitis 09/09/2012  . Hypothyroid 09/09/2012  . Back pain 09/09/2012    It is intermittent now,  previously source of fibromyalgia dx.     Past Surgical History  Procedure Laterality Date  . Cesarean section  1997,  2013  . Laparoscopic gastric banding  1997  . Knee surgery    . Laparoscopy  08/19/2010    Procedure: LAPAROSCOPY OPERATIVE;  Surgeon: Felipa Emory;  Location: [facility];  Service: Gynecology;  Laterality: N/A;  with Biopsy of uterine serosa  . Tubal ligation      8/13  . Gastric banding port revision  01/20/2012    Procedure: GASTRIC BANDING PORT REVISION;  Surgeon: Pedro Earls, MD;  Location: [facility];  Service: General;  Laterality: N/A;  . Laparoscopic appendectomy N/A 09/09/2012    Procedure: APPENDECTOMY LAPAROSCOPIC;  Surgeon: Merrie Roof, MD;  Location: [facility];  Service: General;  Laterality: N/A;   Family History  Problem Relation Age of Onset  . Hypertension Maternal Grandfather   . Diabetes Paternal Grandmother   . Anesthesia problems Neg Hx    History  Substance Use Topics  . Smoking status: Current Every Day Smoker -- 1.00 packs/day    Types: Cigarettes  . Smokeless tobacco: Never Used  . Alcohol Use: No   OB History    Gravida Para Term Preterm AB TAB SAB Ectopic Multiple Living   2 2 2       2      Review of Systems  Constitutional: Negative for fever and chills.  Gastrointestinal: Negative for nausea and vomiting.  Musculoskeletal: Positive for arthralgias (bilateral elbows).  Skin: Positive for wound (abrasion to right knee).  Neurological:  Positive for weakness (bilateral upper extremities). Negative for syncope, speech difficulty and headaches.      Allergies  Paroxetine hcl; Percocet; and Codeine  Home Medications   Prior to Admission medications   Medication Sig Start Date End Date Taking? Authorizing Provider  acetaminophen (TYLENOL) 325 MG tablet Take 2 tablets (650 mg total) by mouth every 6 (six) hours as needed. 09/10/12   Earnstine Regal, PA-C  buPROPion (WELLBUTRIN) 100 MG tablet 100 mg daily. 07/06/13   Historical Provider, MD  doxycycline (VIBRA-TABS) 100 MG tablet Take 1 tablet (100 mg total) by mouth 2 (two) times daily. 09/04/13    Robyn Haber, MD  HYDROcodone-acetaminophen (NORCO/VICODIN) 5-325 MG per tablet Take 2 tablets by mouth every 4 (four) hours as needed. 12/14/13   Pura Spice, PA-C  levothyroxine (SYNTHROID, LEVOTHROID) 150 MCG tablet Take 1 tablet (150 mcg total) by mouth 2 (two) times daily. 09/04/13   Robyn Haber, MD  loratadine (CLARITIN) 10 MG tablet Take 10 mg by mouth daily as needed for allergies.    Historical Provider, MD  PROAIR HFA 108 (90 BASE) MCG/ACT inhaler as needed. 06/16/13   Historical Provider, MD  traMADol (ULTRAM) 50 MG tablet Take 1-2 tablets (50-100 mg total) by mouth every 6 (six) hours as needed. 09/04/13   Robyn Haber, MD   Triage Vitals: BP 144/78 mmHg  Pulse 82  Temp(Src) 98.2 F (36.8 C) (Oral)  Resp 18  SpO2 100%  Physical Exam  Constitutional: She appears well-developed and well-nourished.  HENT:  Head: Normocephalic and atraumatic.  Eyes: Conjunctivae are normal. Right eye exhibits no discharge. Left eye exhibits no discharge.  Cardiovascular:  Pulses:      Radial pulses are 2+ on the right side, and 2+ on the left side.  2+ radial pulses equal bilaterally.   Pulmonary/Chest: Effort normal.  Musculoskeletal: She exhibits tenderness. She exhibits no edema.  Full ROM without pain bilateral legs. No lower leg tenderness. Mild tenderness to anterior right knee. No ligamentous laxity. Negative anterior and posterior drawer test bilaterally. Full ROM to bilateral wrists without tenderness. No bilateral wrist tenderness. No redness, swelling, or lesions.  No redness, swelling, warmth, or lesions to bilateral elbows. TTP of antecubital fossa. Pain with supination and pronation. Strength and sensation intact.  Neurological: She is alert. She exhibits normal muscle tone.  Skin: Skin is warm and dry.  1-2cm abrasion on right knee. No signs of infection.  Psychiatric: She has a normal mood and affect. Her behavior is normal.  Nursing note and vitals  reviewed.   ED Course  Procedures (including critical care time)  DIAGNOSTIC STUDIES: Oxygen Saturation is 100% on room air, normal by my interpretation.    COORDINATION OF CARE: At 5038 Discussed treatment plan with patient which includes possible splint, RICE, and pain medication. Patient agrees. Also, advised patient to have imaging of right knee, however, patient refused.   Labs Review Labs Reviewed - No data to display  Imaging Review Dg Elbow Complete Left  12/14/2013   CLINICAL DATA:  Tripped and fell. Landed on elbow. Left elbow pain and decreased range of motion. Initial encounter.  EXAM: LEFT ELBOW - COMPLETE 3+ VIEW  COMPARISON:  None.  FINDINGS: Elbow joint effusion is seen on the lateral projection. Cortical irregularity involving the radial head is consistent with a nondisplaced fracture. No other fractures identified. No evidence of dislocation.  IMPRESSION: Nondisplaced radial head fracture and elbow joint effusion.   Electronically Signed   By: Sharrie Rothman.D.  On: 12/14/2013 18:45   Dg Elbow Complete Right  12/14/2013   CLINICAL DATA:  Right elbow fracture done at outside institution. Tripped and fell today landing on both elbows.  EXAM: RIGHT ELBOW - COMPLETE 3+ VIEW  COMPARISON:  None.  FINDINGS: There is a right elbow joint effusion. There is a nondisplaced radial head fracture. No additional fracture visualized. No subluxation or dislocation.  IMPRESSION: Nondisplaced right radial head fracture. Right elbow joint effusion.   Electronically Signed   By: Rolm Baptise M.D.   On: 12/14/2013 18:44     EKG Interpretation None      Meds given in ED:  Medications  HYDROcodone-acetaminophen (NORCO/VICODIN) 5-325 MG per tablet 2 tablet (2 tablets Oral Given 12/14/13 1804)  HYDROcodone-acetaminophen (NORCO/VICODIN) 5-325 MG per tablet 2 tablet (2 tablets Oral Given 12/14/13 1912)    Discharge Medication List as of 12/14/2013  8:40 PM    START taking these  medications   Details  HYDROcodone-acetaminophen (NORCO/VICODIN) 5-325 MG per tablet Take 2 tablets by mouth every 4 (four) hours as needed., Starting 12/14/2013, Until Discontinued, Print          MDM   Final diagnoses:  Tenderness  Radial head fracture, closed, left, initial encounter  Radial head fracture, right, closed, initial encounter   Pt with known fracture of right elbow presenting with disc of xrays and pain to left elbow as well. Spoke with radiology who do not evaluate outside films and for a xray read, new xrays would have to be ordered at [facility]. Xrays at [facility] show bilateral nondisplaced fractures of the radial head. Pt without open fracture and is neurovascularly intact. Consult to hand surgery. Spoke with Dr. Caralyn Guile who recommended bilateral splint if she had someone who could help her at home. He felt splint would be for patient comfort. If she did not have home support, splint the most painful joint and place the other arm in a sling. He felt this would be sufficient. On further questioning pt would not have help at home and stated right arm was more painful. Splint of right arm with sling of left ordered. Pt to follow up with orthopedics. Pain script provided. Driving and sedation precautions provided. Conservative tx discussed as well. Splint care discussed and questions answered. Patient is afebrile, nontoxic, and in no acute distress. Patient is appropriate for outpatient management and is stable for discharge.  Discussed return precautions with patient. Discussed all results and patient verbalizes understanding and agrees with plan.  Case has been discussed with Dr. Tamera Punt who agrees with the above plan and to discharge.   I personally performed the services described in this documentation, which was scribed in my presence. The recorded information has been reviewed and is accurate.   Pura Spice, PA-C 12/14/13 [place], MD 12/14/13 2325

## 2013-12-14 NOTE — ED Provider Notes (Signed)
Post splint application cap refill less than 3 seconds full ROM fingers of R hand  Sling in proper placement L   Garald Balding, NP 12/14/13 2103  Malvin Johns, MD 12/14/13 2256

## 2013-12-19 NOTE — ED Notes (Signed)
Patient needed work note 

## 2014-02-03 ENCOUNTER — Encounter: Payer: Self-pay | Admitting: Obstetrics & Gynecology

## 2014-04-21 ENCOUNTER — Encounter: Payer: Self-pay | Admitting: Nurse Practitioner

## 2014-04-21 ENCOUNTER — Telehealth: Payer: Self-pay | Admitting: Obstetrics & Gynecology

## 2014-04-21 ENCOUNTER — Ambulatory Visit (INDEPENDENT_AMBULATORY_CARE_PROVIDER_SITE_OTHER): Payer: BC Managed Care – PPO | Admitting: Nurse Practitioner

## 2014-04-21 VITALS — BP 124/76 | HR 92 | Ht 67.5 in | Wt 275.0 lb

## 2014-04-21 DIAGNOSIS — M25551 Pain in right hip: Secondary | ICD-10-CM | POA: Diagnosis not present

## 2014-04-21 MED ORDER — MEDROXYPROGESTERONE ACETATE 10 MG PO TABS: 10.0000 mg | ORAL_TABLET | Freq: Every day | ORAL | Status: DC

## 2014-04-21 NOTE — Patient Instructions (Signed)
Abdominal Pain, Women °Abdominal (stomach, pelvic, or belly) pain can be caused by many things. It is important to tell your doctor: °· The location of the pain. °· Does it come and go or is it present all the time? °· Are there things that start the pain (eating certain foods, exercise)? °· Are there other symptoms associated with the pain (fever, nausea, vomiting, diarrhea)? °All of this is helpful to know when trying to find the cause of the pain. °CAUSES  °· Stomach: virus or bacteria infection, or ulcer. °· Intestine: appendicitis (inflamed appendix), regional ileitis (Crohn's disease), ulcerative colitis (inflamed colon), irritable bowel syndrome, diverticulitis (inflamed diverticulum of the colon), or cancer of the stomach or intestine. °· Gallbladder disease or stones in the gallbladder. °· Kidney disease, kidney stones, or infection. °· Pancreas infection or cancer. °· Fibromyalgia (pain disorder). °· Diseases of the female organs: °¨ Uterus: fibroid (non-cancerous) tumors or infection. °¨ Fallopian tubes: infection or tubal pregnancy. °¨ Ovary: cysts or tumors. °¨ Pelvic adhesions (scar tissue). °¨ Endometriosis (uterus lining tissue growing in the pelvis and on the pelvic organs). °¨ Pelvic congestion syndrome (female organs filling up with blood just before the menstrual period). °¨ Pain with the menstrual period. °¨ Pain with ovulation (producing an egg). °¨ Pain with an IUD (intrauterine device, birth control) in the uterus. °¨ Cancer of the female organs. °· Functional pain (pain not caused by a disease, may improve without treatment). °· Psychological pain. °· Depression. °DIAGNOSIS  °Your doctor will decide the seriousness of your pain by doing an examination. °· Blood tests. °· X-rays. °· Ultrasound. °· CT scan (computed tomography, special type of X-ray). °· MRI (magnetic resonance imaging). °· Cultures, for infection. °· Barium enema (dye inserted in the large intestine, to better view it with  X-rays). °· Colonoscopy (looking in intestine with a lighted tube). °· Laparoscopy (minor surgery, looking in abdomen with a lighted tube). °· Major abdominal exploratory surgery (looking in abdomen with a large incision). °TREATMENT  °The treatment will depend on the cause of the pain.  °· Many cases can be observed and treated at home. °· Over-the-counter medicines recommended by your caregiver. °· Prescription medicine. °· Antibiotics, for infection. °· Birth control pills, for painful periods or for ovulation pain. °· Hormone treatment, for endometriosis. °· Nerve blocking injections. °· Physical therapy. °· Antidepressants. °· Counseling with a psychologist or psychiatrist. °· Minor or major surgery. °HOME CARE INSTRUCTIONS  °· Do not take laxatives, unless directed by your caregiver. °· Take over-the-counter pain medicine only if ordered by your caregiver. Do not take aspirin because it can cause an upset stomach or bleeding. °· Try a clear liquid diet (broth or water) as ordered by your caregiver. Slowly move to a bland diet, as tolerated, if the pain is related to the stomach or intestine. °· Have a thermometer and take your temperature several times a day, and record it. °· Bed rest and sleep, if it helps the pain. °· Avoid sexual intercourse, if it causes pain. °· Avoid stressful situations. °· Keep your follow-up appointments and tests, as your caregiver orders. °· If the pain does not go away with medicine or surgery, you may try: °¨ Acupuncture. °¨ Relaxation exercises (yoga, meditation). °¨ Group therapy. °¨ Counseling. °SEEK MEDICAL CARE IF:  °· You notice certain foods cause stomach pain. °· Your home care treatment is not helping your pain. °· You need stronger pain medicine. °· You want your IUD removed. °· You feel faint or   lightheaded. °· You develop nausea and vomiting. °· You develop a rash. °· You are having side effects or an allergy to your medicine. °SEEK IMMEDIATE MEDICAL CARE IF:  °· Your  pain does not go away or gets worse. °· You have a fever. °· Your pain is felt only in portions of the abdomen. The right side could possibly be appendicitis. The left lower portion of the abdomen could be colitis or diverticulitis. °· You are passing blood in your stools (bright red or black tarry stools, with or without vomiting). °· You have blood in your urine. °· You develop chills, with or without a fever. °· You pass out. °MAKE SURE YOU:  °· Understand these instructions. °· Will watch your condition. °· Will get help right away if you are not doing well or get worse. °Document Released: 11/03/2006 Document Revised: 05/23/2013 Document Reviewed: 11/23/2008 °[employer]® Patient Information ©2015 [employer]. This information is not intended to replace advice given to you by your health care provider. Make sure you discuss any questions you have with your health care provider. ° °

## 2014-04-21 NOTE — Progress Notes (Signed)
Subjective:     Patient ID: Sally [name], female   DOB: May 01, 1975, 39 y.o.   MRN: [id number]  HPI  This 39 yo WM Female  G2P2 present with problem visit for AUB.  PMP was 3/17 which was heavier and a little longer.  Then had this LMP 3/28 started with light spotting that was small clots only when wiping.  Then heavier today with cramps, low back pain, pain right lower quadrant after being SA this am.  S/P BTL 09/19/2011.  Married for 3.5 years.  States she is heavy bleeding with clots but has only used 1 regular tampon today.   She is very anxious and worried about endometriosis and ovarian cyst.  She also did not get a pap with last AEX and states she has been treated for pre cancerous cells with biopsy in the past.  She denies urinary symptoms.  Normal menses is 28 day, last 3 days with moderate flow.  No cramps   Review of Systems  Constitutional: Negative for fever, chills, diaphoresis and fatigue.  Cardiovascular: Negative.   Gastrointestinal: Negative for nausea, vomiting, abdominal pain, diarrhea, constipation, blood in stool, abdominal distention and anal bleeding.  Genitourinary: Positive for vaginal bleeding, menstrual problem and pelvic pain. Negative for dysuria, urgency, frequency, flank pain, enuresis and dyspareunia.  Musculoskeletal: Negative.        Recent fracture of elbows and injury to right knee from a fall.  Neurological: Negative.   Psychiatric/Behavioral: Positive for decreased concentration. The patient is nervous/anxious.        Objective:   Physical Exam  Constitutional: She is oriented to person, place, and time. She appears well-developed and well-nourished. No distress.  Abdominal: Soft. Bowel sounds are normal. She exhibits no distension and no mass. There is no tenderness. There is no rebound and no guarding.  Genitourinary:  Pelvic exam:  Normal vaginal discharge with minimal bleeding from the cervix.  No clots and cervical os is closed.  On bimanual she  is tender right adnexa without mass.  Musculoskeletal: Normal range of motion.  Neurological: She is alert and oriented to person, place, and time.  Psychiatric:  Very anxious and wanted a complete evaluation today including PUS, pap, scope if needed.   Review of chart with Dr. Sabra Heck:  Pt. Had a normal PUS in 09/2013 other than a small OV cyst.  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    Assessment:     AUB - maybe perimenopausal History of ovarian cyst in the past S/P BTL Afraid of diagnosis of endometriosis, uterine fibroids, cervical cancer, ovarian mass    Plan:     Discussed about hormonal changes that maybe related to perimenopause - she really feels something else is wrong.  Offered her RX for Provera 10 mg daily X 10 days - she has decided that she will take this if bleeding continues, but will most likely wait for now.  Offered her to get PUS next Thursday and will place the order.

## 2014-04-21 NOTE — Telephone Encounter (Signed)
Spoke with patient. Patient states that her LMP was 3/17 and lasted for seven days. "It usually does not last that long. Then this Tuesday I started having clotting when I used the restroom and now I am having a regular period." Also experiencing lower back pain and abdominal cramping. Denies any urinary symptoms. "This is just not normal for me." Patient has history of bilateral tubal ligation.Advised patient will need to be seen for further evaluation. Patient is agreeable and verbalizes understanding. Appointment scheduled for today at 3:45pm with Milford Cage, [place]. Agreeable to date and time and to see NP.   Routing to provider for final review. Patient agreeable to disposition. Will close encounter

## 2014-04-21 NOTE — Telephone Encounter (Signed)
Patient wants to talk with the nurse she states she is not sure if she will need to come into the office.

## 2014-04-24 ENCOUNTER — Telehealth: Payer: Self-pay | Admitting: Nurse Practitioner

## 2014-04-24 DIAGNOSIS — N939 Abnormal uterine and vaginal bleeding, unspecified: Secondary | ICD-10-CM

## 2014-04-24 NOTE — Telephone Encounter (Signed)
Spoke with patient. Patient was seen on 04/21/2014 with Milford Cage, FNP. "They gave me a medication to stop my bleeding and told me I need an ultrasound this Thursday." Patient was started on Provera 10mg  x10 days. Please see plan from OV below. Advised will speak with Dr.Miller regarding ultrasound scheduling and return call. Patient is agreeable. Staff message sent to provider.

## 2014-04-24 NOTE — Telephone Encounter (Signed)
Please schedule PUS for Thursday.  Check with Gay Filler if there are scheduling issues.

## 2014-04-24 NOTE — Progress Notes (Signed)
Reviewed personally.  M. Suzanne Amilia Vandenbrink, MD.  

## 2014-04-24 NOTE — Telephone Encounter (Signed)
Patient calling to check on the status of the call.

## 2014-04-24 NOTE — Telephone Encounter (Signed)
Call to patient. Advised of benefit quote received for PUS. Patient agreeable and hopes to be scheduled this Thursday 04.07.2016. Passed call to [facility] for scheduling.

## 2014-04-25 NOTE — Telephone Encounter (Signed)
Spoke with patient. Appointment scheduled for 4/7 at 12pm for PUS with 12:30pm consult with Dr.Miller (time per Gay Filler). Patient is agreeable to date and time.  Routing to provider for final review. Patient agreeable to disposition. Will close encounter

## 2014-04-27 ENCOUNTER — Other Ambulatory Visit: Payer: BC Managed Care – PPO | Admitting: Obstetrics & Gynecology

## 2014-04-27 ENCOUNTER — Ambulatory Visit (INDEPENDENT_AMBULATORY_CARE_PROVIDER_SITE_OTHER): Payer: BC Managed Care – PPO

## 2014-04-27 ENCOUNTER — Ambulatory Visit (INDEPENDENT_AMBULATORY_CARE_PROVIDER_SITE_OTHER): Payer: BC Managed Care – PPO | Admitting: Obstetrics & Gynecology

## 2014-04-27 ENCOUNTER — Other Ambulatory Visit: Payer: BC Managed Care – PPO

## 2014-04-27 VITALS — BP 126/94 | Ht 67.5 in | Wt 275.0 lb

## 2014-04-27 DIAGNOSIS — Z124 Encounter for screening for malignant neoplasm of cervix: Secondary | ICD-10-CM

## 2014-04-27 DIAGNOSIS — R102 Pelvic and perineal pain: Secondary | ICD-10-CM | POA: Diagnosis not present

## 2014-04-27 DIAGNOSIS — N939 Abnormal uterine and vaginal bleeding, unspecified: Secondary | ICD-10-CM

## 2014-04-27 DIAGNOSIS — N938 Other specified abnormal uterine and vaginal bleeding: Secondary | ICD-10-CM | POA: Diagnosis not present

## 2014-04-27 DIAGNOSIS — N83201 Unspecified ovarian cyst, right side: Secondary | ICD-10-CM

## 2014-04-27 DIAGNOSIS — D252 Subserosal leiomyoma of uterus: Secondary | ICD-10-CM

## 2014-04-27 DIAGNOSIS — N8320 Unspecified ovarian cysts: Secondary | ICD-10-CM | POA: Diagnosis not present

## 2014-04-27 MED ORDER — NORETHINDRONE 0.35 MG PO TABS: 1.0000 | ORAL_TABLET | Freq: Every day | ORAL | Status: DC

## 2014-04-27 NOTE — Progress Notes (Signed)
39 y.o. Sally [name] here for a pelvic ultrasound due to increased pelvic pain and DUB.  Pt has had irregular bleeding off and on in the past which has worsened over last few cycles.  In particular, in march, she bled starting 04/06/14 and then again 04/17/14.  First bleeding felt more like a cycle but was heavier than in the past and a little longer.  The second bleeding was spotting, primarily, with some clots.  She has had pain and dyspareunia since her last delivery, via Cesarean section, however her pelvic pain seems to be some worse as well.  Has been referred to PT but never went due to cost.  Pt has quesitons about possiblity of abnormal pap as well.  Reports when this was present, several years ago, she had a lot of irregular bleeding with.  Pt has not had an abnormal pap since her delivery and has not had sexual partner change.  Last pap 5/14 was neg with neg HR HPV.  She did not have a pap lat AEX, 2015.    Sexually active:  yes  Contraception: bilateral tubal ligation  FINDINGS: UTERUS: 7.9 x 4.2 x 3.6cm with 1cm subserosal fibroid EMS: 5.30mm ADNEXA:   Left ovary 1.7 x 1.2 x 0.9cm   Right ovary 3.1 x 2.9 x 3.0 with thin walled 4.0 x 3.3 x 3.5cm avascular cyst, smooth CUL DE SAC: no free fluid  Findings reviewed with pt.  Concerns about abnormal pap smear addressed.  Although I do not think it is contributing and that pt will have a normal Pap, am ok to repeat one today.  No HR HPV indicated.  Pt voices understanding.  Feel pt would benefit from something for cycle control.  She is a smoker so cannot take estrogen containing OCPs.  Has used mirena in the past but not interested now.  Do not think nexplanon is best option due to DUB side effect, nor depo Provera due to weight gain side effect.  Pt in agreement with above.  She will plan to finish her Provera and then transition to micronor.  DUB during first 2-3 months was discussed.  Exam:  WNWD WF, obese, NAD GYN:  NAEFG, Vaginal  without lesions or masses, cervix parous, Uterus normal without masses, tenderness and fullness on the right.  Pap was obtined  Assessment:  4cm ovarian cyst 1cm subserosal fibroid DUB Smoker Chronic pelvic pain  Plan: Start micronor after finishes provera.  For now, pt will complete Provera and then transition straight to micronor.  Pt to call with irregular bleeding or bleeding lasting >7 days or any clotting Declines medication for pain Repeat PUS 3 months  ~30 minutes spent with patient >50% of time was in face to face discussion of above.  Pt really does have some anxiety about abnormal pap smear and possible need for procedures despite my reassurance.

## 2014-04-29 LAB — IPS PAP TEST WITH REFLEX TO HPV

## 2014-05-01 ENCOUNTER — Telehealth: Payer: Self-pay | Admitting: Obstetrics & Gynecology

## 2014-05-01 NOTE — Telephone Encounter (Signed)
She can stop the Provera and start the micronor.  She is going to start this and then return for ultrasound in about 8 weeks due to an ovarian cyst noted on ultrasound last week.  Pt is a smoker and cannot be on estrogen containing OCPs.

## 2014-05-01 NOTE — Telephone Encounter (Signed)
Patient is calling for recent pap results. Last seen 04/27/14.

## 2014-05-01 NOTE — Telephone Encounter (Signed)
Returned call to patient. Advised pap smear was normal.   Patient states she is taking provera 10 mg x 10 days and is on 7th day. Bleeding stopped initially after 2 days but then returned. States "the bleeding is better, but I still have spotting with clots sometimes." Changes pad q 4-6 hours. Has 3 tablets of provera left. States she was advised to call with her status.  Advised would route message back to Dr. Sabra Heck to review and advise, but to continue with provera until then. Patient agreeable.

## 2014-05-02 NOTE — Telephone Encounter (Signed)
Spoke with patient and message from Dr. Sabra Heck given.  Patient agreeable. She will call back with any bleeding or further concerns. Routing to provider for final review. Patient agreeable to disposition. Will close encounter

## 2014-05-07 ENCOUNTER — Encounter: Payer: Self-pay | Admitting: Obstetrics & Gynecology

## 2014-05-07 DIAGNOSIS — D252 Subserosal leiomyoma of uterus: Secondary | ICD-10-CM | POA: Insufficient documentation

## 2014-07-20 ENCOUNTER — Telehealth: Payer: Self-pay | Admitting: Obstetrics & Gynecology

## 2014-07-20 NOTE — Telephone Encounter (Signed)
Called patient to review benefits for procedure. Left voicemail to call back and review. °

## 2014-07-25 NOTE — Telephone Encounter (Signed)
Spoke with patient. Pt agreeable to information. Ok to close.

## 2014-07-27 ENCOUNTER — Ambulatory Visit (INDEPENDENT_AMBULATORY_CARE_PROVIDER_SITE_OTHER): Payer: BC Managed Care – PPO

## 2014-07-27 ENCOUNTER — Ambulatory Visit (INDEPENDENT_AMBULATORY_CARE_PROVIDER_SITE_OTHER): Payer: BC Managed Care – PPO | Admitting: Obstetrics & Gynecology

## 2014-07-27 VITALS — BP 107/78 | HR 68 | Resp 16 | Wt 262.0 lb

## 2014-07-27 DIAGNOSIS — R5383 Other fatigue: Secondary | ICD-10-CM

## 2014-07-27 DIAGNOSIS — R102 Pelvic and perineal pain: Secondary | ICD-10-CM | POA: Diagnosis not present

## 2014-07-27 DIAGNOSIS — R634 Abnormal weight loss: Secondary | ICD-10-CM | POA: Diagnosis not present

## 2014-07-27 DIAGNOSIS — N83201 Unspecified ovarian cyst, right side: Secondary | ICD-10-CM

## 2014-07-27 DIAGNOSIS — N8320 Unspecified ovarian cysts: Secondary | ICD-10-CM | POA: Diagnosis not present

## 2014-07-27 NOTE — Progress Notes (Signed)
39 y.o. Sally [name] here for a pelvic ultrasound due to DUB.  Pt started on micronor and cycles are improved.  Pt not sure she wants to take this due to having BTL.  Ultrasound done 04/27/14 showing thin walled 4.0cm cyst on the right.  Here for recheck.    Reports she has not been trying to lose weight.  She is down from 275 to 262 today.  Pt has a small amt of fluid in her lap band but she reports this is not enough to cause constriction of eating.  She "knows" how she normally feels when the band has enough fluid in it.  She doesn's think this would account for the weight loss.  Wondering if something else is going on with her body right now.    Patient's last menstrual period was 07/08/2014.  Sexually active:  yes  Contraception: bilateral tubal ligation  FINDINGS: UTERUS: 8.1 x 4.1 x 3.2cm with sizable cesarean section scar noted EMS: 5.30mm ADNEXA:   Left ovary 3.4 x 2.7 x 1.8cm with 1.7OH left follicle   Right ovary 2.2 x 1.4 x 6.0VP with 56mm follicle and resolution of prior cyst CUL DE SAC: small amt of fluid noted in anterior cul de sac  Images reviewed with pt.  Resolution of larger 4.0cm cyst noted.  Pros and cons of stopping micronor discussed.  As cycles are better, she may find that stopping causes worsening of bleeding again.  If this occurs, she will call.  Upon discussed, decides she may stay on the micronor.  Either way is ok, just to let us know if bleeding worsens again.  In regards to weight loss, will plan some lab work today and proceed from that point.  Pt in agreement.  Assessment:  H/o 4.0cm right ovarian cyst, resolved today Weight loss, not really intentional Presence of lab band  Plan:  Pt will stop micronor.  If irregular cycles return, will restart CBC, CMP, Ferritin, TSH  ~15 minutes spent with patient >50% of time was in face to face discussion of above.

## 2014-07-28 LAB — COMPREHENSIVE METABOLIC PANEL
ALBUMIN: 3.4 g/dL — AB (ref 3.5–5.2)
ALT: 17 U/L (ref 0–35)
AST: 14 U/L (ref 0–37)
Alkaline Phosphatase: 62 U/L (ref 39–117)
BUN: 14 mg/dL (ref 6–23)
CALCIUM: 8.6 mg/dL (ref 8.4–10.5)
CHLORIDE: 108 meq/L (ref 96–112)
CO2: 24 mEq/L (ref 19–32)
Creat: 0.7 mg/dL (ref 0.50–1.10)
Glucose, Bld: 87 mg/dL (ref 70–99)
POTASSIUM: 4.4 meq/L (ref 3.5–5.3)
SODIUM: 139 meq/L (ref 135–145)
TOTAL PROTEIN: 6.2 g/dL (ref 6.0–8.3)
Total Bilirubin: 0.3 mg/dL (ref 0.2–1.2)

## 2014-07-28 LAB — CBC
HEMATOCRIT: 43.4 % (ref 36.0–46.0)
HEMOGLOBIN: 14.4 g/dL (ref 12.0–15.0)
MCH: 30.9 pg (ref 26.0–34.0)
MCHC: 33.2 g/dL (ref 30.0–36.0)
MCV: 93.1 fL (ref 78.0–100.0)
MPV: 10.7 fL (ref 8.6–12.4)
Platelets: 290 10*3/uL (ref 150–400)
RBC: 4.66 MIL/uL (ref 3.87–5.11)
RDW: 13.3 % (ref 11.5–15.5)
WBC: 9.1 10*3/uL (ref 4.0–10.5)

## 2014-07-28 LAB — TSH: TSH: 0.014 u[IU]/mL — ABNORMAL LOW (ref 0.350–4.500)

## 2014-07-28 LAB — FERRITIN: FERRITIN: 67 ng/mL (ref 10–291)

## 2014-07-31 ENCOUNTER — Telehealth: Payer: Self-pay | Admitting: Obstetrics & Gynecology

## 2014-07-31 NOTE — Telephone Encounter (Signed)
Notes Recorded by [name], MD on 07/28/2014 at 12:18 PM Please inform pt her Ferritin level (iron stores) were good. Thyroid testing shows too much thyroid medication. Does she have an endocrinologist or does her PCP write this rx? I want her to follow-up with PCP or with specialist but we may need to make a referral for her. This probably explains the weight loss and can explain the fatigue as well. CMP was normal except for albumin being 0.1 pts below normal. This is not bad and nothing needs to be done.   Left message to call [place] at [phone].

## 2014-07-31 NOTE — Telephone Encounter (Signed)
Patient calling to check on status of recent labs.

## 2014-07-31 NOTE — Telephone Encounter (Signed)
Patient notified of all lab results as directed by Dr Sabra Heck. PCP is Dr Charleston Poot and patient will follow up with her. Requests lab results be sent to Dr Greta Doom.   Lavella Hammock, please fax and then close encounter. Thank you.

## 2014-08-01 NOTE — Telephone Encounter (Signed)
Faxed results to Dr. Greta Doom 07/31/14.

## 2014-08-03 ENCOUNTER — Encounter: Payer: Self-pay | Admitting: Obstetrics & Gynecology

## 2014-09-15 ENCOUNTER — Telehealth: Payer: Self-pay | Admitting: Obstetrics & Gynecology

## 2014-09-15 NOTE — Telephone Encounter (Signed)
Left message regarding upcoming appointment has been canceled and needs to be rescheduled. °

## 2014-10-24 ENCOUNTER — Ambulatory Visit: Payer: BC Managed Care – PPO | Admitting: Obstetrics & Gynecology

## 2014-10-25 ENCOUNTER — Ambulatory Visit: Payer: BC Managed Care – PPO | Admitting: Obstetrics & Gynecology

## 2014-11-03 ENCOUNTER — Ambulatory Visit (INDEPENDENT_AMBULATORY_CARE_PROVIDER_SITE_OTHER): Payer: BC Managed Care – PPO | Admitting: Emergency Medicine

## 2014-11-03 ENCOUNTER — Ambulatory Visit (INDEPENDENT_AMBULATORY_CARE_PROVIDER_SITE_OTHER): Payer: BC Managed Care – PPO

## 2014-11-03 ENCOUNTER — Telehealth: Payer: Self-pay | Admitting: Emergency Medicine

## 2014-11-03 VITALS — BP 118/70 | HR 86 | Temp 98.6°F | Resp 18 | Ht 68.0 in | Wt 256.0 lb

## 2014-11-03 DIAGNOSIS — Z131 Encounter for screening for diabetes mellitus: Secondary | ICD-10-CM | POA: Diagnosis not present

## 2014-11-03 DIAGNOSIS — E038 Other specified hypothyroidism: Secondary | ICD-10-CM

## 2014-11-03 DIAGNOSIS — R0602 Shortness of breath: Secondary | ICD-10-CM | POA: Diagnosis not present

## 2014-11-03 DIAGNOSIS — E039 Hypothyroidism, unspecified: Secondary | ICD-10-CM | POA: Diagnosis not present

## 2014-11-03 DIAGNOSIS — R05 Cough: Secondary | ICD-10-CM | POA: Diagnosis not present

## 2014-11-03 DIAGNOSIS — J189 Pneumonia, unspecified organism: Secondary | ICD-10-CM

## 2014-11-03 DIAGNOSIS — R059 Cough, unspecified: Secondary | ICD-10-CM

## 2014-11-03 DIAGNOSIS — R062 Wheezing: Secondary | ICD-10-CM

## 2014-11-03 LAB — TSH: TSH: 37.809 u[IU]/mL — AB (ref 0.350–4.500)

## 2014-11-03 LAB — POCT CBC
Granulocyte percent: 66.3 %G (ref 37–80)
HCT, POC: 47.9 % (ref 37.7–47.9)
HEMOGLOBIN: 16.6 g/dL — AB (ref 12.2–16.2)
LYMPH, POC: 3.7 — AB (ref 0.6–3.4)
MCH, POC: 32.4 pg — AB (ref 27–31.2)
MCHC: 34.8 g/dL (ref 31.8–35.4)
MCV: 93.3 fL (ref 80–97)
MID (cbc): 1 — AB (ref 0–0.9)
MPV: 8.1 fL (ref 0–99.8)
PLATELET COUNT, POC: 253 10*3/uL (ref 142–424)
POC Granulocyte: 9.3 — AB (ref 2–6.9)
POC LYMPH PERCENT: 26.7 %L (ref 10–50)
POC MID %: 7 % (ref 0–12)
RBC: 5.13 M/uL (ref 4.04–5.48)
RDW, POC: 14.3 %
WBC: 14 10*3/uL — AB (ref 4.6–10.2)

## 2014-11-03 LAB — GLUCOSE, POCT (MANUAL RESULT ENTRY): POC GLUCOSE: 92 mg/dL (ref 70–99)

## 2014-11-03 LAB — D-DIMER, QUANTITATIVE (NOT AT ARMC): D DIMER QUANT: 0.4 ug{FEU}/mL (ref 0.00–0.48)

## 2014-11-03 MED ORDER — ALBUTEROL SULFATE 108 (90 BASE) MCG/ACT IN AEPB: 2.0000 | INHALATION_SPRAY | Freq: Four times a day (QID) | RESPIRATORY_TRACT | Status: DC

## 2014-11-03 MED ORDER — IPRATROPIUM BROMIDE 0.02 % IN SOLN
0.5000 mg | Freq: Once | RESPIRATORY_TRACT | Status: AC
  Administered 2014-11-03: 0.5 mg via RESPIRATORY_TRACT

## 2014-11-03 MED ORDER — ALBUTEROL SULFATE (2.5 MG/3ML) 0.083% IN NEBU
2.5000 mg | INHALATION_SOLUTION | Freq: Once | RESPIRATORY_TRACT | Status: AC
  Administered 2014-11-03: 2.5 mg via RESPIRATORY_TRACT

## 2014-11-03 MED ORDER — AZITHROMYCIN 250 MG PO TABS: ORAL_TABLET | ORAL | Status: DC

## 2014-11-03 MED ORDER — ALBUTEROL SULFATE HFA 108 (90 BASE) MCG/ACT IN AERS: 2.0000 | INHALATION_SPRAY | RESPIRATORY_TRACT | Status: DC | PRN

## 2014-11-03 MED ORDER — CEFTRIAXONE SODIUM 1 G IJ SOLR
1.0000 g | Freq: Once | INTRAMUSCULAR | Status: AC
  Administered 2014-11-03: 1 g via INTRAMUSCULAR

## 2014-11-03 MED ORDER — LEVOTHYROXINE SODIUM 150 MCG PO TABS: 150.0000 ug | ORAL_TABLET | Freq: Two times a day (BID) | ORAL | Status: DC

## 2014-11-03 NOTE — Telephone Encounter (Signed)
Pt called concerning her lab results. She stated she expected a call back within 3 hours of her visit to be advised as to wether she had a blood clot or not please contact at [phone].  [phone]

## 2014-11-03 NOTE — Progress Notes (Addendum)
This chart was scribed for Nena Jordan, MD by Thea Alken, ED Scribe. This patient was seen in room 2 and the patient's care was started at 8:38 AM.  Chief Complaint:  Chief Complaint  Patient presents with  . chest congestion    x2 weeks had pcn and prednisone at home took that no help  . Shortness of Breath  . Cough  . Fever    off and on   . Medication Refill    synthroid    HPI: Sally [name] is a 40 y.o. female who reports to [facility] today complaining of worsening cough and SOB for the past 2 weeks. She reports associated chills, intermittent fever, wheeze and SOB with walking up hill and climbing . She denies hx of asthma. States she had bronchitis about 6 months ago and was prescribed a pro-air inhaler that she has been using past 2 week without relief to symptoms. She also has had some sensitivity to lower legs. Pt is a smoker, smokes about a half pack a day, but has not been able to smoke due to symptoms. Pt works at [employer]. She reports a sick contact at work with similar symptoms. She denies sore throat. Pt is allergic to percocet and codeine.  She has hx of tubal ligation.   Past Medical History  Diagnosis Date  . Hypothyroidism   . Anxiety   . Fibromyalgia   . Sleep apnea     pre lap banding-never used cpap or mask  . Back pain     Occ. issues wih back pain  . Acute appendicitis 09/09/2012  . Hypothyroid 09/09/2012  . Back pain 09/09/2012    It is intermittent now,  previously source of fibromyalgia dx.     Past Surgical History  Procedure Laterality Date  . Cesarean section  1997, 2013  . Laparoscopic gastric banding  1997  . Knee surgery    . Laparoscopy  08/19/2010    Procedure: LAPAROSCOPY OPERATIVE;  Surgeon: Felipa Emory;  Location: [facility];  Service: Gynecology;  Laterality: N/A;  with Biopsy of uterine serosa  . Tubal ligation      8/13  . Gastric banding port revision  01/20/2012    Procedure: GASTRIC BANDING PORT REVISION;  Surgeon: Pedro Earls,  MD;  Location: [facility];  Service: General;  Laterality: N/A;  . Laparoscopic appendectomy N/A 09/09/2012    Procedure: APPENDECTOMY LAPAROSCOPIC;  Surgeon: Merrie Roof, MD;  Location: [facility];  Service: General;  Laterality: N/A;   Social History   Social History  . Marital Status: Married    Spouse Name: N/A  . Number of Children: N/A  . Years of Education: N/A   Social History Main Topics  . Smoking status: Current Every Day Smoker -- 1.00 packs/day    Types: Cigarettes  . Smokeless tobacco: Never Used  . Alcohol Use: No  . Drug Use: No  . Sexual Activity:    Partners: Male    Birth Control/ Protection: Surgical     Comment: BTL   Other Topics Concern  . None   Social History Narrative   Family History  Problem Relation Age of Onset  . Hypertension Maternal Grandfather   . Diabetes Paternal Grandmother   . Anesthesia problems Neg Hx    Allergies  Allergen Reactions  . Paroxetine Hcl Rash  . Percocet [Oxycodone-Acetaminophen] Itching  . Codeine Other (See Comments)    Puts patient to sleep "knocks her out" for at  least two days, regardless of dosage.   Prior to Admission medications   Medication Sig Start Date End Date Taking? Authorizing Provider  acetaminophen (TYLENOL) 325 MG tablet Take 2 tablets (650 mg total) by mouth every 6 (six) hours as needed. 09/10/12  Yes Earnstine Regal, PA-C  Camphor-Eucalyptus-Menthol (VICKS VAPORUB EX) Apply topically.   Yes Historical Provider, MD  guaiFENesin (MUCINEX) 600 MG 12 hr tablet Take by mouth 2 (two) times daily.   Yes Historical Provider, MD  levothyroxine (SYNTHROID, LEVOTHROID) 150 MCG tablet Take 1 tablet (150 mcg total) by mouth 2 (two) times daily. 09/04/13  Yes Robyn Haber, MD  traMADol (ULTRAM) 50 MG tablet Take 1-2 tablets (50-100 mg total) by mouth every 6 (six) hours as needed. 09/04/13  Yes Robyn Haber, MD  medroxyPROGESTERone (PROVERA) 10 MG tablet Take 1 tablet (10 mg total) by mouth daily. Patient not  taking: Reported on 11/03/2014 04/21/14   Kem Boroughs, FNP  norethindrone (MICRONOR,CAMILA,ERRIN) 0.35 MG tablet Take 1 tablet (0.35 mg total) by mouth daily. Patient not taking: Reported on 11/03/2014 04/27/14   [name], MD     ROS: The patient denies fevers, chills, night sweats, unintentional weight loss, chest pain, palpitations, dyspnea on exertion, nausea, vomiting, abdominal pain, dysuria, hematuria, melena, numbness, weakness, or tingling.   All other systems have been reviewed and were otherwise negative with the exception of those mentioned in the HPI and as above.    PHYSICAL EXAM: Filed Vitals:   11/03/14 0833  BP: 118/70  Pulse: 86  Temp: 98.6 F (37 C)  TempSrc: Oral  Resp: 18  Height: 5\' 8"  (1.727 m)  Weight: 256 lb (116.121 kg)  SpO2: 91%  rechecked SpO2-96%   Body mass index is 38.93 kg/(m^2).   General: Alert, no acute distress HEENT:  Normocephalic, atraumatic, oropharynx patent. Eye: Juliette Mangle [facility] Cardiovascular:  Regular rate and rhythm, no rubs murmurs or gallops.  No Carotid bruits, radial pulse intact. No pedal edema.  Respiratory: Clear to auscultation bilaterally.  She has bilateral inspiratory and expiratory wheeze, that seem to be worse on the right. Good air exchange. No rales, or rhonchi.  No cyanosis, no use of accessory musculature.Abdominal: No organomegaly, abdomen is soft and non-tender, positive bowel sounds.  No masses. Musculoskeletal: Gait intact. No edema, tenderness Skin: No rashes. Neurologic: Facial musculature symmetric. Psychiatric: Patient acts appropriately throughout our interaction. Lymphatic: No cervical or submandibular lymphadenopathy    LABS: Results for orders placed or performed in visit on 11/03/14  POCT CBC  Result Value Ref Range   WBC 14.0 (A) 4.6 - 10.2 K/uL   Lymph, poc 3.7 (A) 0.6 - 3.4   POC LYMPH PERCENT 26.7 10 - 50 %L   MID (cbc) 1.0 (A) 0 - 0.9   POC MID % 7.0 0 - 12 %M   POC Granulocyte 9.3 (A) 2  - 6.9   Granulocyte percent 66.3 37 - 80 %G   RBC 5.13 4.04 - 5.48 M/uL   Hemoglobin 16.6 (A) 12.2 - 16.2 g/dL   HCT, POC 47.9 37.7 - 47.9 %   MCV 93.3 80 - 97 fL   MCH, POC 32.4 (A) 27 - 31.2 pg   MCHC 34.8 31.8 - 35.4 g/dL   RDW, POC 14.3 %   Platelet Count, POC 253 142 - 424 K/uL   MPV 8.1 0 - 99.8 fL  POCT glucose (manual entry)  Result Value Ref Range   POC Glucose 92 70 - 99 mg/dl   Meds  ordered this encounter  Medications  . guaiFENesin (MUCINEX) 600 MG 12 hr tablet    Sig: Take by mouth 2 (two) times daily.  . Camphor-Eucalyptus-Menthol (VICKS VAPORUB EX)    Sig: Apply topically.  Marland Kitchen albuterol (PROVENTIL) (2.5 MG/3ML) 0.083% nebulizer solution 2.5 mg    Sig:   . ipratropium (ATROVENT) nebulizer solution 0.5 mg    Sig:   . azithromycin (ZITHROMAX) 250 MG tablet    Sig: Take 2 tabs PO x 1 dose, then 1 tab PO QD x 4 days    Dispense:  6 tablet    Refill:  0  . cefTRIAXone (ROCEPHIN) injection 1 g    Sig:   . albuterol (PROVENTIL HFA;VENTOLIN HFA) 108 (90 BASE) MCG/ACT inhaler    Sig: Inhale 2 puffs into the lungs every 4 (four) hours as needed for wheezing or shortness of breath (cough, shortness of breath or wheezing.).    Dispense:  1 Inhaler    Refill:  1   EKG/XRAY:   Primary read interpreted by Dr. Everlene Farrier at [facility]. CXR- there is an infiltrate on left, most likely in the lingula. Please comment whether this is in ligula or left lower lobe.  ASSESSMENT/PLAN: Patient has a definite left-sided pneumonia. We'll treat with Rocephin and Zithromax. Recheck on Sunday. Her albuterol inhaler will be refilled. She will be on Zithromax as her antibiotic. I personally performed the services described in this documentation, which was scribed in my presence. The recorded information has been reviewed and is accurate.  Gross sideeffects, risk and benefits, and alternatives of medications d/w patient. Patient is aware that all medications have potential sideeffects and we are unable  to predict every sideeffect or drug-drug interaction that may occur.  By signing my name below, I, Raven Small, attest that this documentation has been prepared under the direction and in the presence of Nena Jordan, MD.  Electronically Signed: Thea Alken, ED Scribe. 11/03/2014. 8:49 AM.  Arlyss Queen MD 11/03/2014 8:38 AM

## 2014-11-04 NOTE — Telephone Encounter (Signed)
Left message to return call.  Looks like there was a miscommunication regarding the lab results.  The radiologist read the xray and agrees with Dr. Perfecto Kingdom interpretation that there is a pneumonia.  The lab test (D-Dimer) was normal.  Patient has been out of her medication for a few weeks. She was restarted on high-dose Synthroid which she has been on in the past. I would advise patient to return to clinic in 6 weeks for repeat TSH and T4. She needs to continue to keep her follow-up regarding her pneumonia.

## 2014-11-05 ENCOUNTER — Ambulatory Visit (INDEPENDENT_AMBULATORY_CARE_PROVIDER_SITE_OTHER): Payer: BC Managed Care – PPO | Admitting: Emergency Medicine

## 2014-11-05 ENCOUNTER — Telehealth: Payer: Self-pay

## 2014-11-05 VITALS — BP 128/84 | HR 60 | Temp 98.0°F | Resp 16 | Ht 68.0 in | Wt 253.0 lb

## 2014-11-05 DIAGNOSIS — J189 Pneumonia, unspecified organism: Secondary | ICD-10-CM | POA: Diagnosis not present

## 2014-11-05 DIAGNOSIS — R062 Wheezing: Secondary | ICD-10-CM | POA: Diagnosis not present

## 2014-11-05 MED ORDER — IPRATROPIUM BROMIDE 0.02 % IN SOLN
0.5000 mg | Freq: Once | RESPIRATORY_TRACT | Status: AC
  Administered 2014-11-05: 0.5 mg via RESPIRATORY_TRACT

## 2014-11-05 MED ORDER — PREDNISONE 10 MG PO TABS: ORAL_TABLET | ORAL | Status: DC

## 2014-11-05 MED ORDER — ALBUTEROL SULFATE (2.5 MG/3ML) 0.083% IN NEBU
2.5000 mg | INHALATION_SOLUTION | Freq: Once | RESPIRATORY_TRACT | Status: AC
  Administered 2014-11-05: 2.5 mg via RESPIRATORY_TRACT

## 2014-11-05 NOTE — Telephone Encounter (Signed)
Pt is requesting a call back about the labs. I read to her what the notes stated and she said why are we calling her about that. That is the same thing she was already told on her visit. Pt is requesting a phone call back with clarity.

## 2014-11-05 NOTE — Progress Notes (Signed)
Patient ID: Sally [name], female   DOB: 01/17/1976, 39 y.o.   MRN: [id number]     This chart was scribed for Sally Queen, MD by Zola Button, Medical Scribe. This patient was seen in room 12 and the patient's care was started at 3:13 PM.   Chief Complaint:  Chief Complaint  Patient presents with  . Cough    follow up Dr. Everlene Farrier, pt. had pnuemonia    HPI: Sally [name] is a 39 y.o. female who reports to [facility] today for a follow-up for pneumonia. Patient was seen by me 2 days ago for URI symptoms that started 2 weeks prior, including cough, SOB, intermittent fever, chills, and wheezing. She was treated with Rocephin and Zithromax, and her albuterol was refilled. Patient notes that her symptoms have improved a little bit. Her cough is intermittently productive, but dry at times. Patient denies history of asthma. CXR 2 days ago showed: Patchy infiltrate in the lingula consistent with pneumonia. Lungs elsewhere clear. Cardiac silhouette within normal limits.  Patient is a Physiological scientist at [employer].  Past Medical History  Diagnosis Date  . Hypothyroidism   . Anxiety   . Fibromyalgia   . Sleep apnea     pre lap banding-never used cpap or mask  . Back pain     Occ. issues wih back pain  . Acute appendicitis 09/09/2012  . Hypothyroid 09/09/2012  . Back pain 09/09/2012    It is intermittent now,  previously source of fibromyalgia dx.     Past Surgical History  Procedure Laterality Date  . Cesarean section  1997, 2013  . Laparoscopic gastric banding  1997  . Knee surgery    . Laparoscopy  08/19/2010    Procedure: LAPAROSCOPY OPERATIVE;  Surgeon: Felipa Emory;  Location: [facility];  Service: Gynecology;  Laterality: N/A;  with Biopsy of uterine serosa  . Tubal ligation      8/13  . Gastric banding port revision  01/20/2012    Procedure: GASTRIC BANDING PORT REVISION;  Surgeon: Pedro Earls, MD;  Location: [facility];  Service: General;  Laterality: N/A;  . Laparoscopic appendectomy  N/A 09/09/2012    Procedure: APPENDECTOMY LAPAROSCOPIC;  Surgeon: Merrie Roof, MD;  Location: [facility];  Service: General;  Laterality: N/A;   Social History   Social History  . Marital Status: Married    Spouse Name: N/A  . Number of Children: N/A  . Years of Education: N/A   Social History Main Topics  . Smoking status: Current Every Day Smoker -- 1.00 packs/day    Types: Cigarettes  . Smokeless tobacco: Never Used  . Alcohol Use: No  . Drug Use: No  . Sexual Activity:    Partners: Male    Birth Control/ Protection: Surgical     Comment: BTL   Other Topics Concern  . None   Social History Narrative   Family History  Problem Relation Age of Onset  . Hypertension Maternal Grandfather   . Diabetes Paternal Grandmother   . Anesthesia problems Neg Hx    Allergies  Allergen Reactions  . Paroxetine Hcl Rash  . Percocet [Oxycodone-Acetaminophen] Itching  . Codeine Other (See Comments)    Puts patient to sleep "knocks her out" for at least two days, regardless of dosage.   Prior to Admission medications   Medication Sig Start Date End Date Taking? Authorizing Provider  acetaminophen (TYLENOL) 325 MG tablet Take 2 tablets (650 mg total) by mouth every 6 (six)  hours as needed. 09/10/12  Yes Earnstine Regal, PA-C  Albuterol Sulfate (PROAIR RESPICLICK) 573 (90 BASE) MCG/ACT AEPB Inhale 2 puffs into the lungs 4 (four) times daily. 2 puffs every 6 hours 11/03/14  Yes Darlyne Russian, MD  azithromycin (ZITHROMAX) 250 MG tablet Take 2 tabs PO x 1 dose, then 1 tab PO QD x 4 days 11/03/14  Yes Darlyne Russian, MD  Camphor-Eucalyptus-Menthol (VICKS VAPORUB EX) Apply topically.   Yes Historical Provider, MD  guaiFENesin (MUCINEX) 600 MG 12 hr tablet Take by mouth 2 (two) times daily.   Yes Historical Provider, MD  levothyroxine (SYNTHROID, LEVOTHROID) 150 MCG tablet Take 1 tablet (150 mcg total) by mouth 2 (two) times daily. 11/03/14  Yes Darlyne Russian, MD  medroxyPROGESTERone (PROVERA) 10  MG tablet Take 1 tablet (10 mg total) by mouth daily. 04/21/14  Yes Kem Boroughs, FNP  norethindrone (MICRONOR,CAMILA,ERRIN) 0.35 MG tablet Take 1 tablet (0.35 mg total) by mouth daily. Patient not taking: Reported on 11/05/2014 04/27/14   [name], MD  traMADol (ULTRAM) 50 MG tablet Take 1-2 tablets (50-100 mg total) by mouth every 6 (six) hours as needed. Patient not taking: Reported on 11/05/2014 09/04/13   Robyn Haber, MD     ROS: The patient denies fevers, chills, night sweats, unintentional weight loss, chest pain, palpitations, nausea, vomiting, abdominal pain, dysuria, hematuria, melena, numbness, weakness, or tingling.   All other systems have been reviewed and were otherwise negative with the exception of those mentioned in the HPI and as above.    PHYSICAL EXAM: Filed Vitals:   11/05/14 1414  BP: 128/84  Pulse: 60  Temp: 98 F (36.7 C)  Resp: 16   Body mass index is 38.48 kg/(m^2).   General: Alert, no acute distress HEENT:  Normocephalic, atraumatic, oropharynx patent. Eye: Juliette Mangle [facility] Cardiovascular:  Regular rate and rhythm, no rubs murmurs or gallops.  No Carotid bruits, radial pulse intact. No pedal edema.  Respiratory:  Abdominal: No organomegaly, abdomen is soft and non-tender, positive bowel sounds.  No masses. Musculoskeletal: Gait intact. No edema, tenderness Skin: No rashes. Neurologic: Facial musculature symmetric. Psychiatric: Patient acts appropriately throughout our interaction. Lymphatic: No cervical or submandibular lymphadenopathy    LABS:    EKG/XRAY:   Primary read interpreted by Dr. Everlene Farrier at [facility].   ASSESSMENT/PLAN: Vital signs look good. Patient is feeling better. She will need a repeat chest x-ray in 7-10 days. I did tell her I would be out of town and she would need to see one of my partners. She will continue her inhaler use. I did keep her out of work next week.I personally performed the services described in this  documentation, which was scribed in my presence. The recorded information has been reviewed and is accurate.  By signing my name below, I, Zola Button, attest that this documentation has been prepared under the direction and in the presence of Sally Queen, MD.  Electronically Signed: Zola Button, Medical Scribe. 11/05/2014. 3:13 PM.   Gross sideeffects, risk and benefits, and alternatives of medications d/w patient. Patient is aware that all medications have potential sideeffects and we are unable to predict every sideeffect or drug-drug interaction that may occur.  Sally Queen MD 11/05/2014 3:13 PM

## 2014-11-05 NOTE — Patient Instructions (Signed)
Please return to clinic in 7-10 days for a repeat chest 1  Community-Acquired Pneumonia, Adult Pneumonia is an infection of the lungs. There are different types of pneumonia. One type can develop while a person is in a hospital. A different type, called community-acquired pneumonia, develops in people who are not, or have not recently been, in the hospital or other health care facility.  CAUSES Pneumonia may be caused by bacteria, viruses, or funguses. Community-acquired pneumonia is often caused by Streptococcus pneumonia bacteria. These bacteria are often passed from one person to another by breathing in droplets from the cough or sneeze of an infected person. RISK FACTORS The condition is more likely to develop in:  People who havechronic diseases, such as chronic obstructive pulmonary disease (COPD), asthma, congestive heart failure, cystic fibrosis, diabetes, or kidney disease.  People who haveearly-stage or late-stage HIV.  People who havesickle cell disease.  People who havehad their spleen removed (splenectomy).  People who havepoor Human resources officer.  People who havemedical conditions that increase the risk of breathing in (aspirating) secretions their own mouth and nose.   People who havea weakened immune system (immunocompromised).  People who smoke.  People whotravel to areas where pneumonia-causing germs commonly exist.  People whoare around animal habitats or animals that have pneumonia-causing germs, including birds, bats, rabbits, cats, and farm animals. SYMPTOMS Symptoms of this condition include:  Adry cough.  A wet (productive) cough.  Fever.  Sweating.  Chest pain, especially when breathing deeply or coughing.  Rapid breathing or difficulty breathing.  Shortness of breath.  Shaking chills.  Fatigue.  Muscle aches. DIAGNOSIS Your health care provider will take a medical history and perform a physical exam. You may also have other tests,  including:  Imaging studies of your chest, including X-rays.  Tests to check your blood oxygen level and other blood gases.  Other tests on blood, mucus (sputum), fluid around your lungs (pleural fluid), and urine. If your pneumonia is severe, other tests may be done to identify the specific cause of your illness. TREATMENT The type of treatment that you receive depends on many factors, such as the cause of your pneumonia, the medicines you take, and other medical conditions that you have. For most adults, treatment and recovery from pneumonia may occur at home. In some cases, treatment must happen in a hospital. Treatment may include:  Antibiotic medicines, if the pneumonia was caused by bacteria.  Antiviral medicines, if the pneumonia was caused by a virus.  Medicines that are given by mouth or through an IV tube.  Oxygen.  Respiratory therapy. Although rare, treating severe pneumonia may include:  Mechanical ventilation. This is done if you are not breathing well on your own and you cannot maintain a safe blood oxygen level.  Thoracentesis. This procedureremoves fluid around one lung or both lungs to help you breathe better. HOME CARE INSTRUCTIONS  Take over-the-counter and prescription medicines only as told by your health care provider.  Only takecough medicine if you are losing sleep. Understand that cough medicine can prevent your body's natural ability to remove mucus from your lungs.  If you were prescribed an antibiotic medicine, take it as told by your health care provider. Do not stop taking the antibiotic even if you start to feel better.  Sleep in a semi-upright position at night. Try sleeping in a reclining chair, or place a few pillows under your head.  Do not use tobacco products, including cigarettes, chewing tobacco, and e-cigarettes. If you need help quitting,  ask your health care provider.  Drink enough water to keep your urine clear or pale yellow. This  will help to thin out mucus secretions in your lungs. PREVENTION There are ways that you can decrease your risk of developing community-acquired pneumonia. Consider getting a pneumococcal vaccine if:  You are older than 40 years of age.  You are older than 40 years of age and are undergoing cancer treatment, have chronic lung disease, or have other medical conditions that affect your immune system. Ask your health care provider if this applies to you. There are different types and schedules of pneumococcal vaccines. Ask your health care provider which vaccination option is best for you. You may also prevent community-acquired pneumonia if you take these actions:  Get an influenza vaccine every year. Ask your health care provider which type of influenza vaccine is best for you.  Go to the dentist on a regular basis.  Wash your hands often. Use hand sanitizer if soap and water are not available. SEEK MEDICAL CARE IF:  You have a fever.  You are losing sleep because you cannot control your cough with cough medicine. SEEK IMMEDIATE MEDICAL CARE IF:  You have worsening shortness of breath.  You have increased chest pain.  Your sickness becomes worse, especially if you are an older adult or have a weakened immune system.  You cough up blood.   This information is not intended to replace advice given to you by your health care provider. Make sure you discuss any questions you have with your health care provider.   Document Released: 01/06/2005 Document Revised: 09/27/2014 Document Reviewed: 05/03/2014 Elsevier Interactive Patient Education [employer].

## 2014-11-05 NOTE — Telephone Encounter (Signed)
Spoke with patient. Clarified that the D-Dimer was negative. She was instructed by Dr. Everlene Farrier to return today for re-evaluation of pneumonia and I encouraged her to do so.

## 2014-11-06 NOTE — Telephone Encounter (Signed)
Pt came in yesterday

## 2014-11-16 ENCOUNTER — Other Ambulatory Visit: Payer: Self-pay | Admitting: Orthopedic Surgery

## 2014-11-30 ENCOUNTER — Encounter (HOSPITAL_BASED_OUTPATIENT_CLINIC_OR_DEPARTMENT_OTHER): Payer: Self-pay | Admitting: *Deleted

## 2014-12-06 ENCOUNTER — Encounter (HOSPITAL_BASED_OUTPATIENT_CLINIC_OR_DEPARTMENT_OTHER): Payer: Self-pay | Admitting: Anesthesiology

## 2014-12-06 ENCOUNTER — Ambulatory Visit (HOSPITAL_BASED_OUTPATIENT_CLINIC_OR_DEPARTMENT_OTHER)
Admission: RE | Admit: 2014-12-06 | Discharge: 2014-12-06 | Disposition: A | Discharge status: Home / Self Care | Payer: Worker's Compensation | Source: Ambulatory Visit | Attending: Orthopedic Surgery | Admitting: Orthopedic Surgery

## 2014-12-06 ENCOUNTER — Encounter (HOSPITAL_BASED_OUTPATIENT_CLINIC_OR_DEPARTMENT_OTHER)
Admission: RE | Disposition: A | Discharge status: Home / Self Care | Payer: Self-pay | Source: Ambulatory Visit | Attending: Orthopedic Surgery

## 2014-12-06 ENCOUNTER — Ambulatory Visit (HOSPITAL_BASED_OUTPATIENT_CLINIC_OR_DEPARTMENT_OTHER): Payer: Worker's Compensation | Admitting: Anesthesiology

## 2014-12-06 DIAGNOSIS — M797 Fibromyalgia: Secondary | ICD-10-CM | POA: Diagnosis not present

## 2014-12-06 DIAGNOSIS — Z888 Allergy status to other drugs, medicaments and biological substances status: Secondary | ICD-10-CM | POA: Insufficient documentation

## 2014-12-06 DIAGNOSIS — F17210 Nicotine dependence, cigarettes, uncomplicated: Secondary | ICD-10-CM | POA: Diagnosis not present

## 2014-12-06 DIAGNOSIS — Z9884 Bariatric surgery status: Secondary | ICD-10-CM | POA: Diagnosis not present

## 2014-12-06 DIAGNOSIS — Z9851 Tubal ligation status: Secondary | ICD-10-CM | POA: Insufficient documentation

## 2014-12-06 DIAGNOSIS — E039 Hypothyroidism, unspecified: Secondary | ICD-10-CM | POA: Insufficient documentation

## 2014-12-06 DIAGNOSIS — M2241 Chondromalacia patellae, right knee: Secondary | ICD-10-CM | POA: Diagnosis present

## 2014-12-06 DIAGNOSIS — Z79899 Other long term (current) drug therapy: Secondary | ICD-10-CM | POA: Diagnosis not present

## 2014-12-06 DIAGNOSIS — Z885 Allergy status to narcotic agent status: Secondary | ICD-10-CM | POA: Insufficient documentation

## 2014-12-06 DIAGNOSIS — F419 Anxiety disorder, unspecified: Secondary | ICD-10-CM | POA: Diagnosis not present

## 2014-12-06 DIAGNOSIS — G4730 Sleep apnea, unspecified: Secondary | ICD-10-CM | POA: Insufficient documentation

## 2014-12-06 HISTORY — PX: KNEE ARTHROSCOPY WITH DRILLING/MICROFRACTURE: SHX6425

## 2014-12-06 SURGERY — ARTHROSCOPY, KNEE, WITH ABRASION ARTHROPLASTY OR MICROFRACTURE
Anesthesia: General | Site: Knee | Laterality: Right

## 2014-12-06 MED ORDER — PROPOFOL 500 MG/50ML IV EMUL
INTRAVENOUS | Status: AC
  Filled 2014-12-06: qty 50

## 2014-12-06 MED ORDER — HYDROMORPHONE HCL 1 MG/ML IJ SOLN
0.2500 mg | INTRAMUSCULAR | Status: DC | PRN
  Administered 2014-12-06: 0.5 mg via INTRAVENOUS
  Administered 2014-12-06: 0.25 mg via INTRAVENOUS
  Administered 2014-12-06: 0.5 mg via INTRAVENOUS
  Administered 2014-12-06: 0.25 mg via INTRAVENOUS

## 2014-12-06 MED ORDER — HYDROCODONE-ACETAMINOPHEN 5-325 MG PO TABS: 1.0000 | ORAL_TABLET | Freq: Four times a day (QID) | ORAL | Status: DC | PRN

## 2014-12-06 MED ORDER — BUPIVACAINE-EPINEPHRINE (PF) 0.25% -1:200000 IJ SOLN
INTRAMUSCULAR | Status: AC
  Filled 2014-12-06: qty 30

## 2014-12-06 MED ORDER — LIDOCAINE HCL (CARDIAC) 20 MG/ML IV SOLN
INTRAVENOUS | Status: AC
Start: 2014-12-06 — End: 2014-12-06
  Filled 2014-12-06: qty 5

## 2014-12-06 MED ORDER — HYDROMORPHONE HCL 1 MG/ML IJ SOLN
INTRAMUSCULAR | Status: AC
  Filled 2014-12-06: qty 1

## 2014-12-06 MED ORDER — CHLORHEXIDINE GLUCONATE 4 % EX LIQD: 60.0000 mL | Freq: Once | CUTANEOUS | Status: DC

## 2014-12-06 MED ORDER — FENTANYL CITRATE (PF) 100 MCG/2ML IJ SOLN
50.0000 ug | INTRAMUSCULAR | Status: AC | PRN
  Administered 2014-12-06 (×2): 25 ug via INTRAVENOUS
  Administered 2014-12-06: 50 ug via INTRAVENOUS
  Administered 2014-12-06: 100 ug via INTRAVENOUS

## 2014-12-06 MED ORDER — LIDOCAINE HCL (CARDIAC) 20 MG/ML IV SOLN
INTRAVENOUS | Status: DC | PRN
  Administered 2014-12-06: 50 mg via INTRAVENOUS

## 2014-12-06 MED ORDER — CEFAZOLIN SODIUM-DEXTROSE 2-3 GM-% IV SOLR
INTRAVENOUS | Status: AC
  Filled 2014-12-06: qty 50

## 2014-12-06 MED ORDER — ONDANSETRON HCL 4 MG/2ML IJ SOLN
INTRAMUSCULAR | Status: DC | PRN
  Administered 2014-12-06: 4 mg via INTRAVENOUS

## 2014-12-06 MED ORDER — OXYCODONE HCL 5 MG PO TABS: 5.0000 mg | ORAL_TABLET | Freq: Once | ORAL | Status: DC | PRN

## 2014-12-06 MED ORDER — MIDAZOLAM HCL 2 MG/2ML IJ SOLN
1.0000 mg | INTRAMUSCULAR | Status: DC | PRN
  Administered 2014-12-06: 2 mg via INTRAVENOUS

## 2014-12-06 MED ORDER — SCOPOLAMINE 1 MG/3DAYS TD PT72: 1.0000 | MEDICATED_PATCH | Freq: Once | TRANSDERMAL | Status: DC | PRN

## 2014-12-06 MED ORDER — DEXAMETHASONE SODIUM PHOSPHATE 10 MG/ML IJ SOLN
INTRAMUSCULAR | Status: DC | PRN
  Administered 2014-12-06: 10 mg via INTRAVENOUS

## 2014-12-06 MED ORDER — MEPERIDINE HCL 25 MG/ML IJ SOLN: 6.2500 mg | INTRAMUSCULAR | Status: DC | PRN

## 2014-12-06 MED ORDER — BUPIVACAINE HCL (PF) 0.5 % IJ SOLN
INTRAMUSCULAR | Status: DC | PRN
  Administered 2014-12-06: 20 mL

## 2014-12-06 MED ORDER — FENTANYL CITRATE (PF) 100 MCG/2ML IJ SOLN
INTRAMUSCULAR | Status: AC
  Filled 2014-12-06: qty 2

## 2014-12-06 MED ORDER — EPINEPHRINE HCL 1 MG/ML IJ SOLN
INTRAMUSCULAR | Status: DC | PRN
  Administered 2014-12-06: 1 mg

## 2014-12-06 MED ORDER — LACTATED RINGERS IV SOLN
INTRAVENOUS | Status: DC
  Administered 2014-12-06: 08:00:00 via INTRAVENOUS

## 2014-12-06 MED ORDER — BUPIVACAINE HCL (PF) 0.5 % IJ SOLN
INTRAMUSCULAR | Status: AC
  Filled 2014-12-06: qty 30

## 2014-12-06 MED ORDER — SODIUM CHLORIDE 0.9 % IR SOLN
Status: DC | PRN
  Administered 2014-12-06: 3000 mL

## 2014-12-06 MED ORDER — TRAMADOL HCL 50 MG PO TABS: 50.0000 mg | ORAL_TABLET | Freq: Four times a day (QID) | ORAL | Status: DC | PRN

## 2014-12-06 MED ORDER — EPINEPHRINE HCL 1 MG/ML IJ SOLN
INTRAMUSCULAR | Status: AC
  Filled 2014-12-06: qty 1

## 2014-12-06 MED ORDER — DEXAMETHASONE SODIUM PHOSPHATE 10 MG/ML IJ SOLN
INTRAMUSCULAR | Status: AC
  Filled 2014-12-06: qty 1

## 2014-12-06 MED ORDER — FENTANYL CITRATE (PF) 100 MCG/2ML IJ SOLN
INTRAMUSCULAR | Status: AC
Start: 2014-12-06 — End: 2014-12-06
  Filled 2014-12-06: qty 2

## 2014-12-06 MED ORDER — ONDANSETRON HCL 4 MG/2ML IJ SOLN
INTRAMUSCULAR | Status: AC
  Filled 2014-12-06: qty 2

## 2014-12-06 MED ORDER — PROPOFOL 10 MG/ML IV BOLUS
INTRAVENOUS | Status: DC | PRN
  Administered 2014-12-06: 200 mg via INTRAVENOUS

## 2014-12-06 MED ORDER — CEFAZOLIN SODIUM-DEXTROSE 2-3 GM-% IV SOLR
2.0000 g | INTRAVENOUS | Status: AC
  Administered 2014-12-06: 2 g via INTRAVENOUS

## 2014-12-06 MED ORDER — BUPIVACAINE-EPINEPHRINE (PF) 0.5% -1:200000 IJ SOLN
INTRAMUSCULAR | Status: AC
  Filled 2014-12-06: qty 30

## 2014-12-06 MED ORDER — MIDAZOLAM HCL 2 MG/2ML IJ SOLN
INTRAMUSCULAR | Status: AC
  Filled 2014-12-06: qty 2

## 2014-12-06 MED ORDER — GLYCOPYRROLATE 0.2 MG/ML IJ SOLN: 0.2000 mg | Freq: Once | INTRAMUSCULAR | Status: DC | PRN

## 2014-12-06 MED ORDER — BUPIVACAINE HCL (PF) 0.25 % IJ SOLN
INTRAMUSCULAR | Status: AC
  Filled 2014-12-06: qty 30

## 2014-12-06 MED ORDER — OXYCODONE HCL 5 MG/5ML PO SOLN: 5.0000 mg | Freq: Once | ORAL | Status: DC | PRN

## 2014-12-06 MED ORDER — OXYCODONE-ACETAMINOPHEN 5-325 MG PO TABS: 1.0000 | ORAL_TABLET | Freq: Four times a day (QID) | ORAL | Status: DC | PRN

## 2014-12-06 SURGICAL SUPPLY — 40 items
BANDAGE ELASTIC 6 VELCRO ST LF (GAUZE/BANDAGES/DRESSINGS) ×3 IMPLANT
BLADE 4.2CUDA (BLADE) IMPLANT
BLADE GREAT WHITE 4.2 (BLADE) ×3 IMPLANT
CUTTER MENISCUS  4.2MM (BLADE) ×1
CUTTER MENISCUS 4.2MM (BLADE) ×1 IMPLANT
DRAPE ARTHROSCOPY W/POUCH 114 (DRAPES) ×3 IMPLANT
DRSG EMULSION OIL 3X3 NADH (GAUZE/BANDAGES/DRESSINGS) ×3 IMPLANT
DURAPREP 26ML APPLICATOR (WOUND CARE) ×3 IMPLANT
ELECT MENISCUS 165MM 90D (ELECTRODE) ×3 IMPLANT
ELECT REM PT RETURN 9FT ADLT (ELECTROSURGICAL) ×3
ELECTRODE REM PT RTRN 9FT ADLT (ELECTROSURGICAL) ×2 IMPLANT
GAUZE SPONGE 4X4 12PLY STRL (GAUZE/BANDAGES/DRESSINGS) ×3 IMPLANT
GLOVE BIO SURGEON STRL SZ 6.5 (GLOVE) ×2 IMPLANT
GLOVE BIOGEL PI IND STRL 7.0 (GLOVE) ×2 IMPLANT
GLOVE BIOGEL PI IND STRL 8 (GLOVE) ×4 IMPLANT
GLOVE BIOGEL PI INDICATOR 7.0 (GLOVE) ×2
GLOVE BIOGEL PI INDICATOR 8 (GLOVE) ×2
GLOVE ECLIPSE 7.5 STRL STRAW (GLOVE) ×6 IMPLANT
GOWN STRL REUS W/ TWL LRG LVL3 (GOWN DISPOSABLE) ×2 IMPLANT
GOWN STRL REUS W/ TWL XL LVL3 (GOWN DISPOSABLE) ×2 IMPLANT
GOWN STRL REUS W/TWL LRG LVL3 (GOWN DISPOSABLE) ×3
GOWN STRL REUS W/TWL XL LVL3 (GOWN DISPOSABLE) ×6 IMPLANT
HOLDER KNEE FOAM BLUE (MISCELLANEOUS) ×3 IMPLANT
IV NS IRRIG 3000ML ARTHROMATIC (IV SOLUTION) ×4 IMPLANT
KNEE WRAP E Z 3 GEL PACK (MISCELLANEOUS) ×3 IMPLANT
MANIFOLD NEPTUNE II (INSTRUMENTS) ×2 IMPLANT
NDL SAFETY ECLIPSE 18X1.5 (NEEDLE) ×1 IMPLANT
NEEDLE HYPO 18GX1.5 SHARP (NEEDLE) ×3
PACK ARTHROSCOPY DSU (CUSTOM PROCEDURE TRAY) ×3 IMPLANT
PACK BASIN DAY SURGERY FS (CUSTOM PROCEDURE TRAY) ×3 IMPLANT
PAD CAST 4YDX4 CTTN HI CHSV (CAST SUPPLIES) ×2 IMPLANT
PADDING CAST COTTON 4X4 STRL (CAST SUPPLIES) ×3
PENCIL BUTTON HOLSTER BLD 10FT (ELECTRODE) ×3 IMPLANT
SET ARTHROSCOPY TUBING (MISCELLANEOUS) ×3
SET ARTHROSCOPY TUBING LN (MISCELLANEOUS) ×2 IMPLANT
SUT ETHILON 4 0 PS 2 18 (SUTURE) ×3 IMPLANT
SYR 5ML LL (SYRINGE) ×3 IMPLANT
TOWEL OR 17X24 6PK STRL BLUE (TOWEL DISPOSABLE) ×3 IMPLANT
TOWEL OR NON WOVEN STRL DISP B (DISPOSABLE) ×3 IMPLANT
WATER STERILE IRR 1000ML POUR (IV SOLUTION) ×3 IMPLANT

## 2014-12-06 NOTE — Anesthesia Preprocedure Evaluation (Addendum)
Anesthesia Evaluation  Patient identified by MRN, date of birth, ID band Patient awake    Reviewed: Allergy & Precautions, NPO status , Patient's Chart, lab work & pertinent test results  Airway Mallampati: II  TM Distance: >3 FB Neck ROM: Full    Dental  (+) Teeth Intact, Dental Advisory Given   Pulmonary Sleep apnea: Pt never was prescribed any CPAP device as it was not needed. , Current Smoker,    breath sounds clear to auscultation       Cardiovascular  Rhythm:Regular Rate:Normal     Neuro/Psych    GI/Hepatic   Endo/Other    Renal/GU      Musculoskeletal   Abdominal   Peds  Hematology   Anesthesia Other Findings   Reproductive/Obstetrics                            Anesthesia Physical Anesthesia Plan  ASA: II  Anesthesia Plan: General   Post-op Pain Management:    Induction: Intravenous  Airway Management Planned: LMA  Additional Equipment:   Intra-op Plan:   Post-operative Plan: Extubation in OR  Informed Consent: I have reviewed the patients History and Physical, chart, labs and discussed the procedure including the risks, benefits and alternatives for the proposed anesthesia with the patient or authorized representative who has indicated his/her understanding and acceptance.   Dental advisory given  Plan Discussed with: CRNA, Anesthesiologist and Surgeon  Anesthesia Plan Comments:         Anesthesia Quick Evaluation

## 2014-12-06 NOTE — Discharge Instructions (Signed)
POST-OP KNEE ARTHROSCOPY INSTRUCTIONS  Dr. Alain Marion PA-C  Pain You will be expected to have a moderate amount of pain in the affected knee for approximately two weeks. However, the first two days will be the most severe pain. A prescription has been provided to take as needed for the pain. The pain can be reduced by applying ice packs to the knee for the first 1-2 weeks post surgery. Also, keeping the leg elevated on pillows will help alleviate the pain. If you develop any acute pain or swelling in your calf muscle, please call the doctor.  Activity It is preferred that you stay at bed rest for approximately 24 hours. However, you may go to the bathroom with help. Weight bearing as tolerated. You may begin the knee exercises the day of surgery. Discontinue crutches as the knee pain resolves.  Dressing Keep the dressing dry. If the ace bandage should wrinkle or roll up, this can be rewrapped to prevent ridges in the bandage. You may remove all dressings in 48 hours,  apply bandaids to each wound. You may shower on the 4th day after surgery but no tub bath.  Symptoms to report to your doctor Extreme pain Extreme swelling Temperature above 101 degrees Change in the feeling, color, or movement of your toes Redness, heat, or swelling at your incision  Exercise If is preferred that as soon as possible you try to do a straight leg raise without bending the knee and concentrate on bringing the heel of your foot off the bed up to approximately 45 degrees and hold for the count of 10 seconds. Repeat this at least 10 times three or four times per day. Additional exercises are provided below.  You are encouraged to bend the knee as tolerated.  Follow-Up Call to schedule a follow-up appointment in 5-7 days. Our office # is [phone].  POST-OP EXERCISES  Short Arc Quads  1. Lie on back with legs straight. Place towel roll under thigh, just above knee. 2. Tighten thigh muscles to  straighten knee and lift heel off bed. 3. Hold for slow count of five, then lower. 4. Do three sets of ten    Straight Leg Raises  1. Lie on back with operative leg straight and other leg bent. 2. Keeping operative leg completely straight, slowly lift operative leg so foot is 5 inches off bed. 3. Hold for slow count of five, then lower. 4. Do three sets of ten.    DO BOTH EXERCISES 2 TIMES A DAY  Ankle Pumps  Work/move the operative ankle and foot up and down 10 times every hour while awake  Ambulate weightbearing as tolerated on the right with crutches for 10-14 days.   Post Anesthesia Home Care Instructions  Activity: Get plenty of rest for the remainder of the day. A responsible adult should stay with you for 24 hours following the procedure.  For the next 24 hours, DO NOT: -Drive a car -Paediatric nurse -Drink alcoholic beverages -Take any medication unless instructed by your physician -Make any legal decisions or sign important papers.  Meals: Start with liquid foods such as gelatin or soup. Progress to regular foods as tolerated. Avoid greasy, spicy, heavy foods. If nausea and/or vomiting occur, drink only clear liquids until the nausea and/or vomiting subsides. Call your physician if vomiting continues.  Special Instructions/Symptoms: Your throat may feel dry or sore from the anesthesia or the breathing tube placed in your throat during surgery. If this causes discomfort, gargle with  warm salt water. The discomfort should disappear within 24 hours.  If you had a scopolamine patch placed behind your ear for the management of post- operative nausea and/or vomiting:  1. The medication in the patch is effective for 72 hours, after which it should be removed.  Wrap patch in a tissue and discard in the trash. Wash hands thoroughly with soap and water. 2. You may remove the patch earlier than 72 hours if you experience unpleasant side effects which may include dry mouth,  dizziness or visual disturbances. 3. Avoid touching the patch. Wash your hands with soap and water after contact with the patch.

## 2014-12-06 NOTE — Transfer of Care (Signed)
Immediate Anesthesia Transfer of Care Note  Patient: Sally [name]  Procedure(s) Performed: Procedure(s): KNEE ARTHROSCOPY WITH DRILLING/MICROFRACTURE OF LATERAL FEMORAL CHONDRYL, CHONDROPLASTY (Right)  Patient Location: PACU  Anesthesia Type:General  Level of Consciousness: awake and alert   Airway & Oxygen Therapy: Patient Spontanous Breathing and Patient connected to face mask oxygen  Post-op Assessment: Report given to RN and Post -op Vital signs reviewed and stable  Post vital signs: Reviewed and stable  Last Vitals:  Filed Vitals:   12/06/14 0723  BP: 141/78  Pulse: 76  Temp: 36.6 C  Resp: 20    Complications: No apparent anesthesia complications

## 2014-12-06 NOTE — Brief Op Note (Signed)
12/06/2014  10:53 AM  PATIENT:  Sally [name]  39 y.o. female  PRE-OPERATIVE DIAGNOSIS:  CHONDROMALACIA PATELLA FEMORAL JOINT RIGHT  POST-OPERATIVE DIAGNOSIS:  CHONDROMALACIA PATELLA FEMORAL JOINT RIGHT  PROCEDURE:  Procedure(s): KNEE ARTHROSCOPY WITH DRILLING/MICROFRACTURE, CHONDROPLASTY, EXCISION OF PLICA (Right)  SURGEON:  Surgeon(s) and Role:    * Dorna Leitz, MD - Primary

## 2014-12-06 NOTE — Anesthesia Procedure Notes (Signed)
Procedure Name: LMA Insertion Date/Time: 12/06/2014 8:41 AM Performed by: Lieutenant Diego Pre-anesthesia Checklist: Patient identified, Emergency Drugs available, Suction available and Patient being monitored Patient Re-evaluated:Patient Re-evaluated prior to inductionOxygen Delivery Method: Circle System Utilized Preoxygenation: Pre-oxygenation with 100% oxygen Intubation Type: IV induction Ventilation: Mask ventilation without difficulty LMA: LMA inserted LMA Size: 4.0 Number of attempts: 1 Airway Equipment and Method: Bite block Placement Confirmation: positive ETCO2 and breath sounds checked- equal and bilateral Tube secured with: Tape Dental Injury: Teeth and Oropharynx as per pre-operative assessment

## 2014-12-06 NOTE — H&P (Signed)
PREOPERATIVE H&P  Chief Complaint: right knee pain  HPI: Sally [name] is a 39 y.o. female who presents for evaluation of right knee pain. It has been present for many months and has been worsening. She has failed conservative measures. Pain is rated as moderate.  Past Medical History  Diagnosis Date  . Hypothyroidism   . Anxiety   . Fibromyalgia   . Sleep apnea     pre lap banding-never used cpap or mask  . Back pain     Occ. issues wih back pain  . Acute appendicitis 09/09/2012  . Hypothyroid 09/09/2012  . Back pain 09/09/2012    It is intermittent now,  previously source of fibromyalgia dx.     Past Surgical History  Procedure Laterality Date  . Cesarean section  1997, 2013  . Laparoscopic gastric banding  1997  . Knee surgery    . Laparoscopy  08/19/2010    Procedure: LAPAROSCOPY OPERATIVE;  Surgeon: Felipa Emory;  Location: [facility];  Service: Gynecology;  Laterality: N/A;  with Biopsy of uterine serosa  . Tubal ligation      8/13  . Gastric banding port revision  01/20/2012    Procedure: GASTRIC BANDING PORT REVISION;  Surgeon: Pedro Earls, MD;  Location: [facility];  Service: General;  Laterality: N/A;  . Laparoscopic appendectomy N/A 09/09/2012    Procedure: APPENDECTOMY LAPAROSCOPIC;  Surgeon: Merrie Roof, MD;  [facility];  Service: General;  Laterality: N/A;   Social History   Social History  . Marital Status: Married    Spouse Name: N/A  . Number of Children: N/A  . Years of Education: N/A   Social History Main Topics  . Smoking status: Current Every Day Smoker -- 1.00 packs/day    Types: Cigarettes  . Smokeless tobacco: Never Used  . Alcohol Use: No  . Drug Use: No  . Sexual Activity:    Partners: Male    Birth Control/ Protection: Surgical     Comment: BTL   Other Topics Concern  . None   Social History Narrative   Family History  Problem Relation Age of Onset  . Hypertension Maternal Grandfather   . Diabetes Paternal Grandmother    . Anesthesia problems Neg Hx    Allergies  Allergen Reactions  . Paroxetine Hcl Rash  . Percocet [Oxycodone-Acetaminophen] Itching  . Codeine Other (See Comments)    Puts patient to sleep "knocks her out" for at least two days, regardless of dosage.   Prior to Admission medications   Medication Sig Start Date End Date Taking? Authorizing Provider  acetaminophen (TYLENOL) 325 MG tablet Take 2 tablets (650 mg total) by mouth every 6 (six) hours as needed. 09/10/12  Yes Earnstine Regal, PA-C  Albuterol Sulfate (PROAIR RESPICLICK) 123XX123 (90 BASE) MCG/ACT AEPB Inhale 2 puffs into the lungs 4 (four) times daily. 2 puffs every 6 hours 11/03/14  Yes Darlyne Russian, MD  guaiFENesin (MUCINEX) 600 MG 12 hr tablet Take by mouth 2 (two) times daily.   Yes Historical Provider, MD  levothyroxine (SYNTHROID, LEVOTHROID) 150 MCG tablet Take 1 tablet (150 mcg total) by mouth 2 (two) times daily. 11/03/14  Yes Darlyne Russian, MD  traMADol (ULTRAM) 50 MG tablet Take 1-2 tablets (50-100 mg total) by mouth every 6 (six) hours as needed. Patient not taking: Reported on 11/05/2014 09/04/13   Robyn Haber, MD     Positive ROS: none  All other systems have been reviewed and were otherwise  negative with the exception of those mentioned in the HPI and as above.  Physical Exam: There were no vitals filed for this visit.  General: Alert, no acute distress Cardiovascular: No pedal edema Respiratory: No cyanosis, no use of accessory musculature GI: No organomegaly, abdomen is soft and non-tender Skin: No lesions in the area of chief complaint Neurologic: Sensation intact distally Psychiatric: Patient is competent for consent with normal mood and affect Lymphatic: No axillary or cervical lymphadenopathy  MUSCULOSKELETAL: right knee: Positive compression, positive inhibition, mild joint line tenderness, trace effusion.  Minimal pain to range of motion  Assessment/Plan: CHONDROMALACIA PATELLA FEMORAL JPOINT  RIGHT Plan for Procedure(s): KNEE ARTHROSCOPY WITH LATERAL RELEASE  The risks benefits and alternatives were discussed with the patient including but not limited to the risks of nonoperative treatment, versus surgical intervention including infection, bleeding, nerve injury, malunion, nonunion, hardware prominence, hardware failure, need for hardware removal, blood clots, cardiopulmonary complications, morbidity, mortality, among others, and they were willing to proceed.  Predicted outcome is good, although there will be at least a six to nine month expected recovery.  Nohemy Koop L, MD 12/06/2014 6:45 AM

## 2014-12-06 NOTE — Anesthesia Postprocedure Evaluation (Signed)
  Anesthesia Post-op Note  Patient: Sally [name]  Procedure(s) Performed: Procedure(s): KNEE ARTHROSCOPY WITH DRILLING/MICROFRACTURE, CHONDROPLASTY, EXCISION OF PLICA (Right)  Patient Location: PACU  Anesthesia Type: General   Level of Consciousness: awake, alert  and oriented  Airway and Oxygen Therapy: Patient Spontanous Breathing  Post-op Pain: mild  Post-op Assessment: Post-op Vital signs reviewed  Post-op Vital Signs: Reviewed  Last Vitals:  Filed Vitals:   12/06/14 1015  BP: 126/87  Pulse: 78  Temp:   Resp: 16    Complications: No apparent anesthesia complications

## 2014-12-07 ENCOUNTER — Encounter (HOSPITAL_BASED_OUTPATIENT_CLINIC_OR_DEPARTMENT_OTHER): Payer: Self-pay | Admitting: Orthopedic Surgery

## 2014-12-07 NOTE — Op Note (Signed)
[name], Sally [name]             ACCOUNT NO.:  [id number]  MEDICAL RECORD NO.:  [id number]  LOCATION:                               FACILITY:  [facility]  PHYSICIAN:  Alta Corning, M.D.   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  POSTOPERATIVE DIAGNOSES: 1. Severe chondromalacia patellofemoral joint. 2. Severe chondromalacia lateral femoral condyle. 3. Chondromalacia medial femoral condyle.  PROCEDURE: 1. Chondroplasty with microfracture, lateral femoral condyle. 2. Chondroplasty down to bleeding bone, patellofemoral joint. 3. Chondroplasty medial femoral condyle not down to bleeding bone.  4.     Medial shelf plica excision.  SURGEON:  Alta Corning, M.D.  ASSISTANT:  Gary Fleet, P.A.  ANESTHESIA:  General.  BRIEF HISTORY:  [name] is a 39 year old female with history of significant complaint of right knee pain.  She had been treated conservatively for a period of time.  An MRI was obtained, which showed severe chondromalacia of the patellofemoral joint.  After failure of conservative care, she was taken to the operating room for operative knee arthroscopy.  DESCRIPTION OF PROCEDURE:  The patient was taken to the operating room. After adequate  anesthesia was obtained with general anesthetic, the patient was placed supine on the operating table.  The right leg was prepped and draped in usual sterile fashion.  Following this, a routine arthroscopic examination of the knee revealed there was severe chondromalacia of the patellofemoral joint.  Tracking did not appear to be dramatically lateral and there did not appear to be hyper pressure. There was a large medial shelf plica.  We debrided the patellofemoral joint, debrided the medial plica, went down in the  medial compartment. Medial meniscus normal.  Medial femoral condyle had some grade 2 change, which was debrided back to a smooth and stable rim.  Attention turned to the notch.  The ACL was normal.  Attention was turned laterally where unfortunately there was a very large full-thickness defect.  There were flaps of cartilage in this area which were debrided and then left probably a 2 x 2.5 cm area of exposed bone right on the weightbearing portion of the lateral femoral condyle.  We took a microfracture awl out and did about 10 vascular enhancement channels and then debrided this area.  We then went back to the patellofemoral joint and debrided that down to bleeding bone in multiple areas back in the lateral compartment where there was a large area of exposed bone.  Then, we irrigated the knee thoroughly and suctioned it dry.  At this point, the portals were closed with bandage.  Sterile compressive dressing was applied, as well as 20 mL of 0.25% Marcaine for postoperative anesthesia.  The patient was taken to recovery where she was noted to be in satisfactory condition.  Estimated blood loss for the procedure was minimal.     Alta Corning, M.D.     Corliss Skains  D:  12/06/2014  T:  12/06/2014  Job:  [id number]

## 2015-03-31 ENCOUNTER — Other Ambulatory Visit: Payer: Self-pay | Admitting: Emergency Medicine

## 2015-04-03 ENCOUNTER — Ambulatory Visit (INDEPENDENT_AMBULATORY_CARE_PROVIDER_SITE_OTHER): Payer: BC Managed Care – PPO | Admitting: Obstetrics & Gynecology

## 2015-04-03 ENCOUNTER — Encounter: Payer: Self-pay | Admitting: Obstetrics & Gynecology

## 2015-04-03 VITALS — BP 122/76 | HR 76 | Resp 14 | Ht 67.5 in | Wt 266.0 lb

## 2015-04-03 DIAGNOSIS — Z0000 Encounter for general adult medical examination without abnormal findings: Secondary | ICD-10-CM

## 2015-04-03 DIAGNOSIS — Z124 Encounter for screening for malignant neoplasm of cervix: Secondary | ICD-10-CM | POA: Diagnosis not present

## 2015-04-03 DIAGNOSIS — Z01419 Encounter for gynecological examination (general) (routine) without abnormal findings: Secondary | ICD-10-CM | POA: Diagnosis not present

## 2015-04-03 LAB — LIPID PANEL
CHOLESTEROL: 162 mg/dL (ref 125–200)
HDL: 36 mg/dL — ABNORMAL LOW (ref >=46)
LDL Cholesterol: 105 mg/dL (ref <130)
TRIGLYCERIDES: 104 mg/dL (ref <150)
Total CHOL/HDL Ratio: 4.5 Ratio (ref <=5.0)
VLDL: 21 mg/dL (ref <30)

## 2015-04-03 LAB — CBC
HEMATOCRIT: 45.2 % (ref 36.0–46.0)
Hemoglobin: 15.2 g/dL — ABNORMAL HIGH (ref 12.0–15.0)
MCH: 31.1 pg (ref 26.0–34.0)
MCHC: 33.6 g/dL (ref 30.0–36.0)
MCV: 92.6 fL (ref 78.0–100.0)
MPV: 10.2 fL (ref 8.6–12.4)
Platelets: 284 10*3/uL (ref 150–400)
RBC: 4.88 MIL/uL (ref 3.87–5.11)
RDW: 13.9 % (ref 11.5–15.5)
WBC: 7.6 10*3/uL (ref 4.0–10.5)

## 2015-04-03 LAB — COMPREHENSIVE METABOLIC PANEL
ALBUMIN: 3.7 g/dL (ref 3.6–5.1)
ALT: 19 U/L (ref 6–29)
AST: 15 U/L (ref 10–30)
Alkaline Phosphatase: 66 U/L (ref 33–115)
BILIRUBIN TOTAL: 0.4 mg/dL (ref 0.2–1.2)
BUN: 13 mg/dL (ref 7–25)
CO2: 23 mmol/L (ref 20–31)
CREATININE: 0.77 mg/dL (ref 0.50–1.10)
Calcium: 9.1 mg/dL (ref 8.6–10.2)
Chloride: 105 mmol/L (ref 98–110)
Glucose, Bld: 92 mg/dL (ref 65–99)
Potassium: 4.4 mmol/L (ref 3.5–5.3)
SODIUM: 137 mmol/L (ref 135–146)
TOTAL PROTEIN: 6.5 g/dL (ref 6.1–8.1)

## 2015-04-03 LAB — TSH: TSH: 0.59 m[IU]/L

## 2015-04-03 NOTE — Progress Notes (Signed)
Patient ID: Sally [name], female   DOB: 28-Jun-1975, 40 y.o.   MRN: [id number]   40 y.o. [id number] MarriedCaucasianF here for annual exam.  Pt had knee surgery in November.  Pt reports pain is not improved at all.  She is still out of work.  Doing PT now.  Starting injections into the knee.  Reports she's been told she may need a knee replacement.  Pt has not had a second opinion.    Cycles are regular.  She does have cramping with it.  Flow lasts three days.    PCP was Dr. Greta Doom.  Office is now closed.  Going to see Dr. Birdie Riddle in March at [facility] [place].    Patient's last menstrual period was 03/12/2015.          Sexually active: Yes.    The current method of family planning is tubal ligation.    Exercising: Yes.    PT/ Work  Smoker:  yes  Health Maintenance: Pap:  04-27-14 WNL, 5/14 neg pap with neg HR HPV History of abnormal Pap:  yes MMG:  Years ago Colonoscopy:  Years ago- Normal per patient  BMD:   Years ago- WNL per patient  TDaP:  Unsure  Screening Labs: labs drawn today, Hb today: 15.6   reports that she has been smoking Cigarettes.  She has been smoking about 1.00 pack per day. She has never used smokeless tobacco. She reports that she does not drink alcohol or use illicit drugs.  Past Medical History  Diagnosis Date  . Hypothyroidism   . Anxiety   . Fibromyalgia   . Sleep apnea     pre lap banding-never used cpap or mask  . Back pain     Occ. issues wih back pain  . Acute appendicitis 09/09/2012  . Hypothyroid 09/09/2012  . Back pain 09/09/2012    It is intermittent now,  previously source of fibromyalgia dx.      Past Surgical History  Procedure Laterality Date  . Cesarean section  1997, 2013  . Laparoscopic gastric banding  1997  . Knee surgery    . Laparoscopy  08/19/2010    Procedure: LAPAROSCOPY OPERATIVE;  Surgeon: Felipa Emory;  Location: [facility];  Service: Gynecology;  Laterality: N/A;  with Biopsy of uterine serosa  . Tubal ligation      8/13   . Gastric banding port revision  01/20/2012    Procedure: GASTRIC BANDING PORT REVISION;  Surgeon: Pedro Earls, MD;  Location: [facility];  Service: General;  Laterality: N/A;  . Laparoscopic appendectomy N/A 09/09/2012    Procedure: APPENDECTOMY LAPAROSCOPIC;  Surgeon: Merrie Roof, MD;  Location: [facility];  Service: General;  Laterality: N/A;  . Knee arthroscopy with drilling/microfracture Right 12/06/2014    Procedure: KNEE ARTHROSCOPY WITH DRILLING/MICROFRACTURE, CHONDROPLASTY, EXCISION OF PLICA;  Surgeon: Dorna Leitz, MD;  Location: [facility];  Service: Orthopedics;  Laterality: Right;    Current Outpatient Prescriptions  Medication Sig Dispense Refill  . acetaminophen (TYLENOL) 325 MG tablet Take 2 tablets (650 mg total) by mouth every 6 (six) hours as needed.    Marland Kitchen levothyroxine (SYNTHROID, LEVOTHROID) 150 MCG tablet Take 1 tablet (150 mcg total) by mouth 2 (two) times daily. 180 tablet 3  . meloxicam (MOBIC) 15 MG tablet Take 15 mg by mouth daily.    Marland Kitchen PROAIR RESPICLICK 123XX123 (90 Base) MCG/ACT AEPB TAKE 2 PUFFS EVERY 6 HOURS 1 each 0  . traMADol (ULTRAM) 50 MG  tablet Take 1-2 tablets (50-100 mg total) by mouth every 6 (six) hours as needed for moderate pain. 40 tablet 0   No current facility-administered medications for this visit.    Family History  Problem Relation Age of Onset  . Hypertension Maternal Grandfather   . Diabetes Paternal Grandmother   . Anesthesia problems Neg Hx     ROS:  Pertinent items are noted in HPI.  Otherwise, a comprehensive ROS was negative.  Exam:   BP 122/76 mmHg  Pulse 76  Resp 14  Ht 5' 7.5" (1.715 m)  Wt 266 lb (120.657 kg)  BMI 41.02 kg/m2  LMP 03/12/2015  Weight change: +4#   Height: 5' 7.5" (171.5 cm)  Ht Readings from Last 3 Encounters:  04/03/15 5' 7.5" (1.715 m)  12/06/14 5\' 8"  (1.727 m)  11/05/14 5\' 8"  (1.727 m)    General appearance: alert, cooperative and appears stated age Head: Normocephalic, without obvious  abnormality, atraumatic Neck: no adenopathy, supple, symmetrical, trachea midline and thyroid normal to inspection and palpation Lungs: clear to auscultation bilaterally Breasts: normal appearance, no masses or tenderness Heart: regular rate and rhythm Abdomen: soft, non-tender; bowel sounds normal; no masses,  no organomegaly Extremities: extremities normal, atraumatic, no cyanosis or edema Skin: Skin color, texture, turgor normal.  Several dark lesions on arms and legs. Lymph nodes: Cervical, supraclavicular, and axillary nodes normal. No abnormal inguinal nodes palpated Neurologic: Grossly normal   Pelvic: External genitalia:  no lesions              Urethra:  normal appearing urethra with no masses, tenderness or lesions              Bartholins and Skenes: normal                 Vagina: normal appearing vagina with normal color and discharge, no lesions              Cervix: no lesions              Pap taken: Yes.   Bimanual Exam:  Uterus:  normal size, contour, position, consistency, mobility, non-tender              Adnexa: normal adnexa and no mass, fullness, tenderness               Rectovaginal: Confirms               Anus:  normal sphincter tone, no lesions  Chaperone was present for exam.   A: Well Woman with normal exam  S/P Cesarean section 09/19/11  Pelvic floor pain and dyspareunia and mild fecal incontinence since delivery.  Referral was made to PT but pt never went.  Declines referral again. Right knee pain after arthroscopy, currently out of work H/O abnormal Pap with CIN 1  H/o Lap band H/O hypothyroidism  H/O BLT Dark skin lesions.  P:  Mammogram guidelines discussed.  As pt has no family hx of breast cancer, she and I discussed starting at age 45. Pap smear and HR HPV 2014.  Neg Pap 2016.  Pap only.  CMP, CBC, TSH, Vit D, Lipids, HbA1c today Establishing care with New PCP in late march Pt knows I do not have tetanus documentation and Dr. Greta Doom  office is closed.  Could not fine documentation in [place].  Pt aware she should just get one at some point.   Dermatology appt recommended.  Name given. return annually or prn

## 2015-04-03 NOTE — Patient Instructions (Signed)
Marcine Matar, MD.  [facility].  Please call for an appointment.

## 2015-04-04 LAB — HEMOGLOBIN A1C
Hgb A1c MFr Bld: 5.3 % (ref <5.7)
Mean Plasma Glucose: 105 mg/dL (ref <117)

## 2015-04-04 LAB — VITAMIN D 25 HYDROXY (VIT D DEFICIENCY, FRACTURES): Vit D, 25-Hydroxy: 16 ng/mL — ABNORMAL LOW (ref 30–100)

## 2015-04-05 ENCOUNTER — Other Ambulatory Visit: Payer: Self-pay | Admitting: *Deleted

## 2015-04-05 ENCOUNTER — Telehealth: Payer: Self-pay | Admitting: *Deleted

## 2015-04-05 DIAGNOSIS — E559 Vitamin D deficiency, unspecified: Secondary | ICD-10-CM

## 2015-04-05 LAB — IPS PAP TEST WITH REFLEX TO HPV

## 2015-04-05 MED ORDER — VITAMIN D (ERGOCALCIFEROL) 1.25 MG (50000 UNIT) PO CAPS: 50000.0000 [IU] | ORAL_CAPSULE | ORAL | Status: DC

## 2015-04-05 NOTE — Telephone Encounter (Signed)
Patient notified

## 2015-04-05 NOTE — Telephone Encounter (Signed)
Notes Recorded by Elroy Channel, CMA on 04/05/2015 at 12:56 PM LM for pt to call back. Notes Recorded by [name], MD on 04/05/2015 at 8:18 AM Please let pt know her Vit D is low. Needs 50K weekly for 12 weeks and level repeated. Order have not been placed. CMP, TSH, HbA1C are normal. Her cholesterol looks good as well--total 162, LDL 105. HDLs are low. Exercise and weight loss will help this number but she's been having a lot of knee trouble and cannot be as active as she'd like. This is not anything she can really work on right now. Lastly, her CBC was normal except her hemoglobin is mildly elevated but this is common in individuals who smoke. Nothing else is needed for evaluation. Thanks.

## 2015-04-16 ENCOUNTER — Encounter: Payer: Self-pay | Admitting: Family Medicine

## 2015-04-16 ENCOUNTER — Ambulatory Visit (INDEPENDENT_AMBULATORY_CARE_PROVIDER_SITE_OTHER): Payer: BC Managed Care – PPO | Admitting: Family Medicine

## 2015-04-16 VITALS — BP 142/82 | HR 74 | Temp 98.0°F | Resp 16 | Ht 68.0 in | Wt 261.4 lb

## 2015-04-16 DIAGNOSIS — F419 Anxiety disorder, unspecified: Principal | ICD-10-CM

## 2015-04-16 DIAGNOSIS — R072 Precordial pain: Secondary | ICD-10-CM | POA: Diagnosis not present

## 2015-04-16 DIAGNOSIS — F329 Major depressive disorder, single episode, unspecified: Secondary | ICD-10-CM

## 2015-04-16 DIAGNOSIS — F418 Other specified anxiety disorders: Secondary | ICD-10-CM | POA: Diagnosis not present

## 2015-04-16 DIAGNOSIS — R0789 Other chest pain: Secondary | ICD-10-CM | POA: Diagnosis not present

## 2015-04-16 DIAGNOSIS — Z23 Encounter for immunization: Secondary | ICD-10-CM | POA: Diagnosis not present

## 2015-04-16 DIAGNOSIS — R079 Chest pain, unspecified: Secondary | ICD-10-CM | POA: Insufficient documentation

## 2015-04-16 DIAGNOSIS — E038 Other specified hypothyroidism: Secondary | ICD-10-CM

## 2015-04-16 DIAGNOSIS — F32A Depression, unspecified: Secondary | ICD-10-CM | POA: Insufficient documentation

## 2015-04-16 MED ORDER — CLONAZEPAM 0.5 MG PO TABS: 0.5000 mg | ORAL_TABLET | Freq: Two times a day (BID) | ORAL | Status: DC | PRN

## 2015-04-16 MED ORDER — ESCITALOPRAM OXALATE 10 MG PO TABS: 10.0000 mg | ORAL_TABLET | Freq: Every day | ORAL | Status: DC

## 2015-04-16 MED ORDER — TRAZODONE HCL 50 MG PO TABS: 25.0000 mg | ORAL_TABLET | Freq: Every evening | ORAL | Status: DC | PRN

## 2015-04-16 NOTE — Patient Instructions (Signed)
Follow up in 4-6 weeks to recheck anxiety We'll notify you of your lab results as soon as they are available If you have worsening chest pain or other concerns- call immediately Start the Lexapro once daily Use the clonazepam as needed for those panicked moments Start the Trazodone nightly for sleep- start w/ 1/2 tab and increase as needed Call with any questions or concerns Hang in there!!! Welcome!  We're glad to have you!!!

## 2015-04-16 NOTE — Progress Notes (Signed)
Pre visit review using our clinic review tool, if applicable. No additional management support is needed unless otherwise documented below in the visit note. 

## 2015-04-16 NOTE — Progress Notes (Signed)
   Subjective:    Patient ID: Sally [name], female    DOB: [birth year], 40 y.o.   MRN: [id number]  HPI New to establish.  Previous MD- [facility]  Hypothyroid- chronic problem, currently on Levothyroxine.  TSH checked ~2 weeks ago at OB/GYN.  Chest pain- pt reports increased stress recently.  Fell at work 18 months ago and broke both elbows and injured knee.  Has been out of work and dealing w/ money issues, worker's comp.  Pt reports for last week she has had chest tightness w/ intermittent sharp pains.  Occasionally associated w/ SOB.  CP and SOB are always in setting of increased stress levels.  No immediate family hx of heart disease.  Pt has hx of anxiety and depression and has taken Lexapro in the past.  Pt has been more tearful.  Not sleeping well- 'my mind is racing'.  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 She appears well-developed and well-nourished. No distress.  HENT:  Head: Normocephalic and atraumatic.  Eyes: Conjunctivae and EOM are normal. Pupils are equal, round, and reactive to light.  Neck: Normal range of motion. Neck supple. No thyromegaly present.  Cardiovascular: Normal rate, regular rhythm, normal heart sounds and intact distal pulses.   No murmur heard. Pulmonary/Chest: Effort normal and breath sounds normal. No respiratory distress.  Abdominal: Soft. She exhibits no distension. There is no tenderness.  Musculoskeletal: She exhibits no edema.  Lymphadenopathy:    She has no cervical adenopathy.  Neurological: She is alert and oriented to person, place, and time.  Skin: Skin is warm and dry.  Psychiatric: Her behavior is normal.  Tearful, very upset  Vitals reviewed.         Assessment & Plan:

## 2015-04-17 ENCOUNTER — Other Ambulatory Visit (INDEPENDENT_AMBULATORY_CARE_PROVIDER_SITE_OTHER): Payer: BC Managed Care – PPO

## 2015-04-17 ENCOUNTER — Other Ambulatory Visit: Payer: Self-pay

## 2015-04-17 DIAGNOSIS — R072 Precordial pain: Secondary | ICD-10-CM

## 2015-04-17 DIAGNOSIS — R0789 Other chest pain: Secondary | ICD-10-CM | POA: Diagnosis not present

## 2015-04-17 DIAGNOSIS — R7989 Other specified abnormal findings of blood chemistry: Secondary | ICD-10-CM | POA: Diagnosis not present

## 2015-04-17 DIAGNOSIS — R778 Other specified abnormalities of plasma proteins: Secondary | ICD-10-CM

## 2015-04-17 LAB — TROPONIN I: TNIDX: 0 ug/L (ref 0.00–0.06)

## 2015-04-17 NOTE — Assessment & Plan Note (Signed)
New to provider, hx of similar for pt.  Her CP is most likely anxiety related and due to her multiple stressors.  Start daily controller med w/ low dose benzo as rescue medication.  Trazodone for sleep as pt reports she is not able to sleep- thus making her reserve to handle her stressors even less.  Will continue to monitor closely.

## 2015-04-17 NOTE — Assessment & Plan Note (Signed)
New.  Suspect that this is anxiety related.  Nothing on EKG to explain her chest tightness.  Will get troponin to r/o cardiac cause.  Will tx underlying anxiety and see if sxs improve.  If not, will refer to cards for stress test.  Pt expressed understanding and is in agreement w/ plan.

## 2015-04-17 NOTE — Assessment & Plan Note (Signed)
New to provider, ongoing for pt.  Reviewed recent labs done by GYN.  No med changes required.  Will continue to follow.

## 2015-04-23 ENCOUNTER — Telehealth: Payer: Self-pay | Admitting: Family Medicine

## 2015-04-23 NOTE — Telephone Encounter (Signed)
Pt states that her clonazepam was for 30 tablets, she is asking that if she is able to take two tablets a day why was there only 30 pills.

## 2015-04-23 NOTE — Telephone Encounter (Signed)
Called pt and LMOVM to return call.  °

## 2015-04-23 NOTE — Telephone Encounter (Signed)
Please advise 

## 2015-04-23 NOTE — Telephone Encounter (Signed)
It is only to be used only as needed- not twice daily every day.  The 30 pills are to gauge how frequently it is used and we can make adjustments based on the first 30.

## 2015-04-23 NOTE — Telephone Encounter (Signed)
Pt informed and expressed an understanding.  

## 2015-05-07 ENCOUNTER — Encounter (HOSPITAL_COMMUNITY): Payer: Self-pay

## 2015-05-07 ENCOUNTER — Emergency Department (HOSPITAL_COMMUNITY): Payer: BC Managed Care – PPO

## 2015-05-07 DIAGNOSIS — I493 Ventricular premature depolarization: Secondary | ICD-10-CM | POA: Insufficient documentation

## 2015-05-07 DIAGNOSIS — E039 Hypothyroidism, unspecified: Secondary | ICD-10-CM | POA: Diagnosis not present

## 2015-05-07 DIAGNOSIS — F17210 Nicotine dependence, cigarettes, uncomplicated: Secondary | ICD-10-CM | POA: Insufficient documentation

## 2015-05-07 DIAGNOSIS — R079 Chest pain, unspecified: Secondary | ICD-10-CM | POA: Diagnosis present

## 2015-05-07 DIAGNOSIS — F419 Anxiety disorder, unspecified: Secondary | ICD-10-CM | POA: Diagnosis not present

## 2015-05-07 DIAGNOSIS — Z791 Long term (current) use of non-steroidal anti-inflammatories (NSAID): Secondary | ICD-10-CM | POA: Diagnosis not present

## 2015-05-07 DIAGNOSIS — Z8669 Personal history of other diseases of the nervous system and sense organs: Secondary | ICD-10-CM | POA: Insufficient documentation

## 2015-05-07 DIAGNOSIS — Z79899 Other long term (current) drug therapy: Secondary | ICD-10-CM | POA: Insufficient documentation

## 2015-05-07 DIAGNOSIS — Z8719 Personal history of other diseases of the digestive system: Secondary | ICD-10-CM | POA: Insufficient documentation

## 2015-05-07 DIAGNOSIS — R110 Nausea: Secondary | ICD-10-CM | POA: Diagnosis not present

## 2015-05-07 DIAGNOSIS — Z8739 Personal history of other diseases of the musculoskeletal system and connective tissue: Secondary | ICD-10-CM | POA: Diagnosis not present

## 2015-05-07 LAB — CBC
HCT: 44.7 % (ref 36.0–46.0)
HEMOGLOBIN: 15.1 g/dL — AB (ref 12.0–15.0)
MCH: 31.7 pg (ref 26.0–34.0)
MCHC: 33.8 g/dL (ref 30.0–36.0)
MCV: 93.9 fL (ref 78.0–100.0)
PLATELETS: 269 10*3/uL (ref 150–400)
RBC: 4.76 MIL/uL (ref 3.87–5.11)
RDW: 13.3 % (ref 11.5–15.5)
WBC: 8.3 10*3/uL (ref 4.0–10.5)

## 2015-05-07 LAB — BASIC METABOLIC PANEL
ANION GAP: 7 (ref 5–15)
BUN: 12 mg/dL (ref 6–20)
CO2: 26 mmol/L (ref 22–32)
CREATININE: 0.88 mg/dL (ref 0.44–1.00)
Calcium: 8.9 mg/dL (ref 8.9–10.3)
Chloride: 106 mmol/L (ref 101–111)
GLUCOSE: 89 mg/dL (ref 65–99)
Potassium: 4.1 mmol/L (ref 3.5–5.1)
Sodium: 139 mmol/L (ref 135–145)

## 2015-05-07 LAB — I-STAT TROPONIN, ED: TROPONIN I, POC: 0 ng/mL (ref 0.00–0.08)

## 2015-05-07 NOTE — ED Notes (Signed)
Pt complaining of sharp pain in chest. Pt complaining of being light headed and nauseated. Hx: Anxiety.

## 2015-05-08 ENCOUNTER — Emergency Department (HOSPITAL_COMMUNITY)
Admission: EM | Admit: 2015-05-08 | Discharge: 2015-05-08 | Disposition: A | Discharge status: Home / Self Care | Payer: BC Managed Care – PPO | Attending: Emergency Medicine | Admitting: Emergency Medicine

## 2015-05-08 DIAGNOSIS — R079 Chest pain, unspecified: Secondary | ICD-10-CM

## 2015-05-08 DIAGNOSIS — I493 Ventricular premature depolarization: Secondary | ICD-10-CM

## 2015-05-08 MED ORDER — METOPROLOL TARTRATE 25 MG PO TABS
25.0000 mg | ORAL_TABLET | Freq: Two times a day (BID) | ORAL | Status: DC
Start: 2015-05-08 — End: 2015-05-10

## 2015-05-08 MED ORDER — METOPROLOL TARTRATE 25 MG PO TABS
25.0000 mg | ORAL_TABLET | Freq: Once | ORAL | Status: AC
  Administered 2015-05-08: 25 mg via ORAL
  Filled 2015-05-08: qty 1

## 2015-05-08 NOTE — ED Provider Notes (Signed)
CSN: [id number]     Arrival date & time 05/07/15  1929 History  By signing my name below, I, Helane Gunther, attest that this documentation has been prepared under the direction and in the presence of Jola Schmidt, MD. Electronically Signed: Helane Gunther, ED Scribe. 05/08/2015. 2:30 AM.     Chief Complaint  Patient presents with  . Chest Pain   The history is provided by the patient. No language interpreter was used.   HPI Comments: ANJALIE AMBROISE is a 40 y.o. female with a PMHx of anxiety, hypothyroidism, and fibromyalgia who presents to the Emergency Department complaining of intermittent, "popping", dull chest pain onset 2 weeks ago. Pt states each episode lasts for a few seconds at a time, sometimes several times in a row for up to 10 minutes, and feels as though she is being punched in the chest. She reports associated palpitations, nausea, and lightheadedness. She notes exacerbation of the pain with deep breathing, but denies any exacerbation from her usual exercise routines. She states she goes to physical therapy 3x a week and has been able to do her usual work-outs without any issues. She notes her PCP recently prescribed her Lexapro and Klonopin, to be taken PRN for anxiety. She states she drinks about 4-5 cups of coffee every day, but denies drinking large amounts of soda or taking large amounts of cough medication. She denies a PMHx of HTN, high cholesterol, and DM. Pt denies LOC.   Past Medical History  Diagnosis Date  . Hypothyroidism   . Anxiety   . Fibromyalgia   . Sleep apnea     pre lap banding-never used cpap or mask  . Back pain     Occ. issues wih back pain  . Acute appendicitis 09/09/2012  . Hypothyroid 09/09/2012  . Back pain 09/09/2012    It is intermittent now,  previously source of fibromyalgia dx.     Past Surgical History  Procedure Laterality Date  . Cesarean section  1997, 2013  . Laparoscopic gastric banding  1997  . Knee surgery    . Laparoscopy   08/19/2010    Procedure: LAPAROSCOPY OPERATIVE;  Surgeon: Felipa Emory;  Location: [facility];  Service: Gynecology;  Laterality: N/A;  with Biopsy of uterine serosa  . Tubal ligation      8/13  . Gastric banding port revision  01/20/2012    Procedure: GASTRIC BANDING PORT REVISION;  Surgeon: Pedro Earls, MD;  Location: [facility];  Service: General;  Laterality: N/A;  . Laparoscopic appendectomy N/A 09/09/2012    Procedure: APPENDECTOMY LAPAROSCOPIC;  Surgeon: Merrie Roof, MD;  Location: [facility];  Service: General;  Laterality: N/A;  . Knee arthroscopy with drilling/microfracture Right 12/06/2014    Procedure: KNEE ARTHROSCOPY WITH DRILLING/MICROFRACTURE, CHONDROPLASTY, EXCISION OF PLICA;  Surgeon: Dorna Leitz, MD;  Location: [facility];  Service: Orthopedics;  Laterality: Right;  . Appendectomy     Family History  Problem Relation Age of Onset  . Hypertension Maternal Grandfather   . Diabetes Paternal Grandmother   . Anesthesia problems Neg Hx    Social History  Substance Use Topics  . Smoking status: Current Every Day Smoker -- 1.00 packs/day    Types: Cigarettes  . Smokeless tobacco: Never Used  . Alcohol Use: No   OB History    Gravida Para Term Preterm AB TAB SAB Ectopic Multiple Living   2 2 2       2      Review  of Systems A complete 10 system review of systems was obtained and all systems are negative except as noted in the HPI and PMH.   Allergies  Paroxetine hcl; Percocet; and Codeine  Home Medications   Prior to Admission medications   Medication Sig Start Date End Date Taking? Authorizing Provider  Aspirin-Acetaminophen-Caffeine (EXCEDRIN PO) Take 1-2 tablets by mouth every 6 (six) hours as needed (HEADACHE).    Yes Historical Provider, MD  clonazePAM (KLONOPIN) 0.5 MG tablet Take 1 tablet (0.5 mg total) by mouth 2 (two) times daily as needed for anxiety. 04/16/15  Yes Midge Minium, MD  escitalopram (LEXAPRO) 10 MG tablet Take 1 tablet (10 mg  total) by mouth daily. 04/16/15  Yes Midge Minium, MD  levothyroxine (SYNTHROID, LEVOTHROID) 150 MCG tablet Take 1 tablet (150 mcg total) by mouth 2 (two) times daily. 11/03/14  Yes Darlyne Russian, MD  loratadine (CLARITIN) 10 MG tablet Take 10 mg by mouth daily as needed for allergies.   Yes Historical Provider, MD  meloxicam (MOBIC) 15 MG tablet Take 15 mg by mouth daily.   Yes Historical Provider, MD  PROAIR RESPICLICK 123XX123 (90 Base) MCG/ACT AEPB TAKE 2 PUFFS EVERY 6 HOURS Patient taking differently: TAKE 2 PUFFS EVERY 6 HOURS AS NEEDED FOR SHORTNESS OF BREATH 04/01/15  Yes Darlyne Russian, MD  traMADol (ULTRAM) 50 MG tablet Take 1-2 tablets (50-100 mg total) by mouth every 6 (six) hours as needed for moderate pain. 12/06/14  Yes Gary Fleet, PA-C  Vitamin D, Ergocalciferol, (DRISDOL) 50000 units CAPS capsule Take 1 capsule (50,000 Units total) by mouth every 7 (seven) days. 04/05/15  Yes [name], MD   BP 118/73 mmHg  Pulse 66  Temp(Src) 97.6 F (36.4 C) (Oral)  Resp 19  Ht 5\' 7"  (1.702 m)  SpO2 100%  LMP 05/07/2015 (Exact Date) Physical Exam  Constitutional: She is oriented to person, place, and time. She appears well-developed and well-nourished. No distress.  HENT:  Head: Normocephalic and atraumatic.  Eyes: EOM are normal.  Neck: Normal range of motion.  Cardiovascular: Normal rate, regular rhythm and normal heart sounds.   Pulmonary/Chest: Effort normal and breath sounds normal.  Abdominal: Soft. She exhibits no distension. There is no tenderness.  Musculoskeletal: Normal range of motion.  Neurological: She is alert and oriented to person, place, and time.  Skin: Skin is warm and dry.  Psychiatric: She has a normal mood and affect. Judgment normal.  Nursing note and vitals reviewed.   ED Course  Procedures  DIAGNOSTIC STUDIES: Oxygen Saturation is 100% on RA, normal by my interpretation.    COORDINATION OF CARE: 2:29 AM - Discussed probable PVC and plans to order  metoprolol. Advised pt to reduce her caffeine intake to 1 cup of coffee per day and to f/u with her PCP. Will refer to a cardiologist. Pt advised of plan for treatment and pt agrees.  Labs Review Labs Reviewed  CBC - Abnormal; Notable for the following:    Hemoglobin 15.1 (*)    All other components within normal limits  BASIC METABOLIC PANEL  Randolm Idol, ED    Imaging Review Dg Chest 2 View  05/07/2015  CLINICAL DATA:  Chest pain for 1 day. EXAM: CHEST  2 VIEW COMPARISON:  11/03/2014 FINDINGS: The heart size and mediastinal contours are within normal limits. Both lungs are clear. The visualized skeletal structures are unremarkable. IMPRESSION: No active cardiopulmonary disease. Electronically Signed   By: Cindra Eves.D.  On: 05/07/2015 20:01   I have personally reviewed and evaluated these images and lab results as part of my medical decision-making.   EKG Interpretation   Date/Time:  Monday May 07 2015 19:41:32 EDT Ventricular Rate:  85 PR Interval:  116 QRS Duration: 112 QT Interval:  370 QTC Calculation: 440 R Axis:   50 Text Interpretation:  Normal sinus rhythm Normal ECG No significant change  was found Confirmed by Owynn Mosqueda  MD, Dacota Ruben ([id number]) on 05/08/2015 1:22:03 AM      MDM   Final diagnoses:  Chest pain, unspecified chest pain type  Premature ventricular contractions    Patient continued having intermittent PVCs while in exam room during my history and physical exam.  Every time she had a PVC every created her symptoms.  She's having symptomatic PVCs.  I recommended that she stop smoking cigarettes and minimize her caffeine intake.  I'll put her on low-dose beta blocker.  Follow-up with primary care physician and outpatient cardiology.  She understands return to the ER for new or worsening symptoms.  Doubt ACS.  I personally performed the services described in this documentation, which was scribed in my presence. The recorded information has been  reviewed and is accurate.       Jola Schmidt, MD 05/08/15 [phone]

## 2015-05-08 NOTE — ED Notes (Signed)
Patient able to ambulate independently  

## 2015-05-08 NOTE — Discharge Instructions (Signed)
Premature Ventricular Contraction A premature ventricular contraction is an irregularity in the normal heart rhythm. These contractions are extra heartbeats that occur too early in the normal sequence. In most cases, these contractions are harmless and do not require treatment. CAUSES Premature ventricular contractions may occur without a known cause. In healthy people, the extra contractions may be caused by:  Smoking.  Drinking alcohol.  Caffeine.  Certain medicines.  Some illegal drugs.  Stress. Sometimes, changes in chemicals in the blood (electrolytes) can also cause premature ventricular contractions. They can also occur in people with heart diseases that cause a decrease in blood flow to the heart. SIGNS AND SYMPTOMS Premature ventricular contractions often do not cause any symptoms. In some cases, you may have a feeling of your heart beating fast or skipping a beat (palpitations). DIAGNOSIS Your health care provider will take your medical history and do a physical exam. During the exam, the health care provider will check for irregular heartbeats. Various tests may be done to help diagnose premature ventricular contractions. These tests may include:  An ECG (electrocardiogram) to monitor the electrical activity of your heart.  Holter monitor testing. A Holter monitor is a portable device that can monitor the electrical activity of your heart over longer periods of time.  Stress tests to see how exercise affects your heart rhythm.  Echocardiogram. This test uses sound waves (ultrasound) to produce an image of your heart.  Electrophysiology study. This is used to evaluate the electrical conduction system of your heart. TREATMENT Usually, no treatment is needed. You may be advised to avoid things that can trigger the premature contractions, such as caffeine or alcohol. Medicines are sometimes given if symptoms are severe or if the extra heartbeats are very frequent. Treatment may  also be needed for an underlying cause of the contractions if one is found. HOME CARE INSTRUCTIONS  Take medicines only as directed by your health care provider.  Make any lifestyle changes recommended by your health care provider. These may include:  Quitting smoking.  Avoiding or limiting caffeine or alcohol.  Exercising. Talk to your health care provider about what type of exercise is safe for you.  Trying to reduce stress.  Keep all follow-up visits with your health care provider. This is important. SEEK IMMEDIATE MEDICAL CARE IF:  You feel palpitations that are frequent or continual.  You have chest pain.  You have shortness of breath.  You have sweating for no reason.  You have nausea and vomiting.  You become light-headed or faint.   This information is not intended to replace advice given to you by your health care provider. Make sure you discuss any questions you have with your health care provider.   Document Released: 08/24/2003 Document Revised: 01/27/2014 Document Reviewed: 06/09/2013 Elsevier Interactive Patient Education 2016 [employer]  

## 2015-05-10 ENCOUNTER — Encounter: Payer: Self-pay | Admitting: Family Medicine

## 2015-05-10 ENCOUNTER — Ambulatory Visit (INDEPENDENT_AMBULATORY_CARE_PROVIDER_SITE_OTHER): Payer: BC Managed Care – PPO | Admitting: Family Medicine

## 2015-05-10 VITALS — BP 142/80 | HR 79 | Temp 98.1°F | Resp 17 | Ht 68.0 in | Wt 259.1 lb

## 2015-05-10 DIAGNOSIS — R0789 Other chest pain: Secondary | ICD-10-CM | POA: Diagnosis not present

## 2015-05-10 MED ORDER — METOPROLOL TARTRATE 50 MG PO TABS: 50.0000 mg | ORAL_TABLET | Freq: Two times a day (BID) | ORAL | Status: DC

## 2015-05-10 NOTE — Progress Notes (Signed)
Pre visit review using our clinic review tool, if applicable. No additional management support is needed unless otherwise documented below in the visit note. 

## 2015-05-10 NOTE — Patient Instructions (Signed)
Follow up as needed We'll call you with your cardiology appt and to get your holter monitor set up Increase the Metoprolol to 50mg  twice daily- 2 tabs twice daily (1 of the new prescription twice daily) Call with any questions or concerns Hang in there!!!

## 2015-05-10 NOTE — Progress Notes (Signed)
   Subjective:    Patient ID: Sally [name], female    DOB: 1975/08/22, 40 y.o.   MRN: [id number]  HPI ER f/u- pt went to ER on 4/18 for CP.  Had associated dizziness, felt like 'someone was punching me in the chest'.  EKG was WNL.  Currently asymptomatic.  No radiation of pain at the time.  Pain will occur at 'random' times.  Stress level remains high.  Has some associated SOB w/ chest pain.  Is taking the Lexapro prescribed at last visit, using Clonazepam once daily as needed- trazodone makes her feel worse.  Pt reports CP is occuring 'at least daily'.  Pt reports 'it seems like it stops and then someone beats me'.  Pt is now on Metoprolol 25mg  BID- some improvement but 'they're still there'.  Review of Systems For ROS see HPI     Objective:   Physical Exam  Constitutional: She is oriented to person, place, and time. She appears well-developed and well-nourished. No distress.  HENT:  Head: Normocephalic and atraumatic.  Eyes: Conjunctivae and EOM are normal. Pupils are equal, round, and reactive to light.  Neck: Normal range of motion. Neck supple. No thyromegaly present.  Cardiovascular: Normal rate, regular rhythm, normal heart sounds and intact distal pulses.   No murmur heard. Pulmonary/Chest: Effort normal and breath sounds normal. No respiratory distress.  Abdominal: Soft. She exhibits no distension. There is no tenderness.  Musculoskeletal: She exhibits no edema.  Lymphadenopathy:    She has no cervical adenopathy.  Neurological: She is alert and oriented to person, place, and time.  Skin: Skin is warm and dry.  Psychiatric: She has a normal mood and affect. Her behavior is normal.  Vitals reviewed.         Assessment & Plan:

## 2015-05-13 NOTE — Assessment & Plan Note (Signed)
Ongoing issue.  Pt was told she has PVCs in ER and was started on metoprolol.  She continues to have daily sxs.  Based on this, will increase Metoprolol to 50mg  BID.  Will get holter and refer pt to cards.  Pt expressed understanding and is in agreement w/ plan.

## 2015-05-14 ENCOUNTER — Ambulatory Visit (INDEPENDENT_AMBULATORY_CARE_PROVIDER_SITE_OTHER): Payer: BC Managed Care – PPO | Admitting: Cardiology

## 2015-05-14 ENCOUNTER — Encounter: Payer: Self-pay | Admitting: Cardiology

## 2015-05-14 VITALS — BP 116/88 | HR 71 | Ht 67.5 in | Wt 259.0 lb

## 2015-05-14 DIAGNOSIS — R079 Chest pain, unspecified: Secondary | ICD-10-CM | POA: Diagnosis not present

## 2015-05-14 DIAGNOSIS — E669 Obesity, unspecified: Secondary | ICD-10-CM | POA: Diagnosis not present

## 2015-05-14 DIAGNOSIS — I493 Ventricular premature depolarization: Secondary | ICD-10-CM

## 2015-05-14 DIAGNOSIS — R002 Palpitations: Secondary | ICD-10-CM

## 2015-05-14 DIAGNOSIS — Z720 Tobacco use: Secondary | ICD-10-CM | POA: Diagnosis not present

## 2015-05-14 NOTE — Patient Instructions (Signed)
Medication Instructions:  The current medical regimen is effective;  continue present plan and medications.  Testing/Procedures: Your physician has requested that you have an echocardiogram. Echocardiography is a painless test that uses sound waves to create images of your heart. It provides your doctor with information about the size and shape of your heart and how well your heart's chambers and valves are working. This procedure takes approximately one hour. There are no restrictions for this procedure.  Follow-Up: Follow up as needed.  Thank you for choosing [facility]!!

## 2015-05-14 NOTE — Progress Notes (Signed)
Cardiology Office Note    Date:  05/14/2015   ID:  ASHONTI JAKUB, DOB 29-Jun-1975, MRN [id number]  PCP:  Annye Asa, MD  Cardiologist:   Candee Furbish, MD     History of Present Illness:  Sally [name] is a 40 y.o. female here for evaluation of chest pain at the request of Dr. Birdie Riddle.  Was seen in the emergency room on 05/08/15 with chest discomfort, associated dizziness, felt as though "someone was punching me in the chest ". Thankfully, troponin was normal, EKG was also unremarkable. Has high anxiety/stress levels. Didn't feel as though shortness of breath was associated with chest pain. Chest pain correlated with PVCs on telemetry monitoring. Metoprolol 25 mg twice a day was started because of palpitations with some improvement but still remaining.  Denies alcohol use. No decongestant use. Occasional caffeine use. No high-risk symptoms such as syncope.   Has ongoing tobacco use. No exertional chest discomfort.   Past Medical History  Diagnosis Date  . Hypothyroidism   . Anxiety   . Fibromyalgia   . Sleep apnea     pre lap banding-never used cpap or mask  . Back pain     Occ. issues wih back pain  . Acute appendicitis 09/09/2012  . Hypothyroid 09/09/2012  . Back pain 09/09/2012    It is intermittent now,  previously source of fibromyalgia dx.      Past Surgical History  Procedure Laterality Date  . Cesarean section  1997, 2013  . Laparoscopic gastric banding  1997  . Knee surgery    . Laparoscopy  08/19/2010    Procedure: LAPAROSCOPY OPERATIVE;  Surgeon: Felipa Emory;  Location: [facility];  Service: Gynecology;  Laterality: N/A;  with Biopsy of uterine serosa  . Tubal ligation      8/13  . Gastric banding port revision  01/20/2012    Procedure: GASTRIC BANDING PORT REVISION;  Surgeon: Pedro Earls, MD;  Location: [facility];  Service: General;  Laterality: N/A;  . Laparoscopic appendectomy N/A 09/09/2012    Procedure: APPENDECTOMY LAPAROSCOPIC;  Surgeon:  Merrie Roof, MD;  Location: [facility];  Service: General;  Laterality: N/A;  . Knee arthroscopy with drilling/microfracture Right 12/06/2014    Procedure: KNEE ARTHROSCOPY WITH DRILLING/MICROFRACTURE, CHONDROPLASTY, EXCISION OF PLICA;  Surgeon: Dorna Leitz, MD;  Location: [facility];  Service: Orthopedics;  Laterality: Right;  . Appendectomy      Current Medications: Outpatient Prescriptions Prior to Visit  Medication Sig Dispense Refill  . clonazePAM (KLONOPIN) 0.5 MG tablet Take 1 tablet (0.5 mg total) by mouth 2 (two) times daily as needed for anxiety. 30 tablet 1  . escitalopram (LEXAPRO) 10 MG tablet Take 1 tablet (10 mg total) by mouth daily. 30 tablet 3  . levothyroxine (SYNTHROID, LEVOTHROID) 150 MCG tablet Take 1 tablet (150 mcg total) by mouth 2 (two) times daily. 180 tablet 3  . loratadine (CLARITIN) 10 MG tablet Take 10 mg by mouth daily as needed for allergies.    . meloxicam (MOBIC) 15 MG tablet Take 15 mg by mouth daily.    . metoprolol (LOPRESSOR) 50 MG tablet Take 1 tablet (50 mg total) by mouth 2 (two) times daily. 60 tablet 3  . traMADol (ULTRAM) 50 MG tablet Take 1-2 tablets (50-100 mg total) by mouth every 6 (six) hours as needed for moderate pain. 40 tablet 0  . Vitamin D, Ergocalciferol, (DRISDOL) 50000 units CAPS capsule Take 1 capsule (50,000 Units total) by mouth  every 7 (seven) days. 12 capsule 0  . PROAIR RESPICLICK 123XX123 (90 Base) MCG/ACT AEPB TAKE 2 PUFFS EVERY 6 HOURS (Patient taking differently: TAKE 2 PUFFS EVERY 6 HOURS AS NEEDED FOR SHORTNESS OF BREATH) 1 each 0  . Aspirin-Acetaminophen-Caffeine (EXCEDRIN PO) Take 1-2 tablets by mouth every 6 (six) hours as needed (HEADACHE).      No facility-administered medications prior to visit.     Allergies:   Paroxetine hcl; Percocet; and Codeine   Social History   Social History  . Marital Status: Married    Spouse Name: N/A  . Number of Children: N/A  . Years of Education: N/A   Social History  Main Topics  . Smoking status: Current Every Day Smoker -- 1.00 packs/day    Types: Cigarettes  . Smokeless tobacco: Never Used  . Alcohol Use: No  . Drug Use: No  . Sexual Activity:    Partners: Male    Birth Control/ Protection: Surgical     Comment: BTL   Other Topics Concern  . None   Social History Narrative     Family History:  The patient's family history includes Diabetes in her paternal grandmother; Hypertension in her maternal grandfather. There is no history of Anesthesia problems.   ROS:   Please see the history of present illness.   Anxiety, dizziness ROS All other systems reviewed and are negative.   PHYSICAL EXAM:   VS:  BP 116/88 mmHg  Pulse 71  Ht 5' 7.5" (1.715 m)  Wt 259 lb (117.482 kg)  BMI 39.94 kg/m2  LMP 05/07/2015 (Exact Date)   GEN: Well nourished, well developed, in no acute distress HEENT: normal Neck: no JVD, carotid bruits, or masses Cardiac: RRR; no murmurs, rubs, or gallops,no edema  Respiratory:  clear to auscultation bilaterally, normal work of breathing GI: soft, nontender, nondistended, + BS MS: no deformity or atrophy Skin: warm and dry, no rash Neuro:  Alert and Oriented x 3, Strength and sensation are intact Psych: euthymic mood, full affect  Wt Readings from Last 3 Encounters:  05/14/15 259 lb (117.482 kg)  05/10/15 259 lb 2 oz (117.538 kg)  04/16/15 261 lb 6 oz (118.559 kg)      Studies/Labs Reviewed:   EKG: 05/07/15-sinus rhythm, no other abnormalities. Normal intervals. Personally viewed.  Recent Labs: 04/03/2015: ALT 19; TSH 0.59 05/07/2015: BUN 12; Creatinine, Ser 0.88; Hemoglobin 15.1*; Platelets 269; Potassium 4.1; Sodium 139   Lipid Panel    Component Value Date/Time   CHOL 162 04/03/2015 1113   TRIG 104 04/03/2015 1113   HDL 36* 04/03/2015 1113   CHOLHDL 4.5 04/03/2015 1113   VLDL 21 04/03/2015 1113   LDLCALC 105 04/03/2015 1113    Additional studies/ records that were reviewed today include:  Prior lab  work, ER visit, chest x-ray, EKG personally viewed-lab work personally viewed.    ASSESSMENT:    1. Palpitations   2. Chest pain, unspecified chest pain type   3. Obesity   4. Tobacco use   5. PVC (premature ventricular contraction)      PLAN:  In order of problems listed above:  Atypical chest pain -ER visit reviewed, troponin normal, EKG unremarkable. -Based upon her description, sounds as though the thyroid feeling in her chest is physiologic response to premature ventricular contractions which were seen on telemetry in the emergency room. This is caused by increased diastolic filling time following reset after premature ventricular contraction. This sensation made her nervous, may have been continuing as well  to dizzy sensation perhaps from change in breathing pattern. She has not had any exertional discomfort. EKG unremarkable. -I will check an echocardiogram to ensure proper structure and function of her heart. -LDL 105  Palpitations -Continuing with metoprolol at current dose however if echocardiogram is normal and her symptoms begin to subside and thyroid functions are reassuring, it would not be unreasonable to begin a taper down process of the metoprolol. She does think that it may upset her stomach somewhat.  PVCs -Illustrated in the emergency room on telemetry. No sustained arrhythmias. No high risk symptoms such as syncope.  Tobacco use -Strongly encourage cessation. Related to her that this is the largest risk factors for myocardial infarction, stroke.  Obesity -Continue to encourage weight loss. Prior lap band surgery April 2000.    Medication Adjustments/Labs and Tests Ordered: Current medicines are reviewed at length with the patient today.  Concerns regarding medicines are outlined above.  Medication changes, Labs and Tests ordered today are listed in the Patient Instructions below. Patient Instructions  Medication Instructions:  The current medical regimen  is effective;  continue present plan and medications.  Testing/Procedures: Your physician has requested that you have an echocardiogram. Echocardiography is a painless test that uses sound waves to create images of your heart. It provides your doctor with information about the size and shape of your heart and how well your heart's chambers and valves are working. This procedure takes approximately one hour. There are no restrictions for this procedure.  Follow-Up: Follow up as needed.  Thank you for choosing [facility]!!            Signed, Candee Furbish, MD  05/14/2015 11:56 AM    [address], [place], [place]  [address] Phone: [phone]; Fax: [phone]

## 2015-05-23 ENCOUNTER — Ambulatory Visit: Payer: Self-pay | Admitting: Family Medicine

## 2015-05-23 ENCOUNTER — Other Ambulatory Visit: Payer: Self-pay

## 2015-05-23 ENCOUNTER — Ambulatory Visit (HOSPITAL_COMMUNITY): Payer: BC Managed Care – PPO | Attending: Internal Medicine

## 2015-05-23 DIAGNOSIS — R002 Palpitations: Secondary | ICD-10-CM | POA: Diagnosis not present

## 2015-05-23 DIAGNOSIS — R079 Chest pain, unspecified: Secondary | ICD-10-CM | POA: Insufficient documentation

## 2015-05-23 DIAGNOSIS — Z720 Tobacco use: Secondary | ICD-10-CM | POA: Insufficient documentation

## 2015-05-23 MED ORDER — PERFLUTREN LIPID MICROSPHERE
1.0000 mL | INTRAVENOUS | Status: AC | PRN
  Administered 2015-05-23: 1 mL via INTRAVENOUS

## 2015-05-23 MED ORDER — PERFLUTREN LIPID MICROSPHERE: 1.0000 mL | INTRAVENOUS | Status: DC | PRN

## 2015-06-06 ENCOUNTER — Other Ambulatory Visit: Payer: Self-pay | Admitting: General Practice

## 2015-06-06 MED ORDER — ESCITALOPRAM OXALATE 10 MG PO TABS: 10.0000 mg | ORAL_TABLET | Freq: Every day | ORAL | Status: DC

## 2015-06-11 ENCOUNTER — Telehealth: Payer: Self-pay | Admitting: Obstetrics & Gynecology

## 2015-06-11 NOTE — Telephone Encounter (Signed)
Returning a call to Kaitlyn. °

## 2015-06-11 NOTE — Telephone Encounter (Signed)
Patient would like to talk with Dr.Miller's nurse regarding her cycle that is a week past due.

## 2015-06-11 NOTE — Telephone Encounter (Signed)
Spoke with patient. Patient's LMP was 05/07/2015. Patient's cycle has not started for this month. Reports she has never missed a cycle prior to this except when she was pregnancy. Has had a tubal ligation. Took a UPT which was negative. Denies any other symptoms. Advised she will need to be seen in the office for further evaluation. She is agreeable. Appointment scheduled for 06/15/2015 at 11:15 am with Dr.Miller. She is agreeable to date and time.  Routing to provider for final review. Patient agreeable to disposition. Will close encounter.

## 2015-06-11 NOTE — Telephone Encounter (Signed)
Left message to call Kaitlyn at [phone]. 

## 2015-06-13 ENCOUNTER — Other Ambulatory Visit: Payer: Self-pay | Admitting: General Practice

## 2015-06-13 MED ORDER — ESCITALOPRAM OXALATE 10 MG PO TABS: 10.0000 mg | ORAL_TABLET | Freq: Every day | ORAL | Status: DC

## 2015-06-15 ENCOUNTER — Ambulatory Visit (INDEPENDENT_AMBULATORY_CARE_PROVIDER_SITE_OTHER): Payer: BC Managed Care – PPO | Admitting: Obstetrics & Gynecology

## 2015-06-15 ENCOUNTER — Other Ambulatory Visit: Payer: Self-pay | Admitting: Obstetrics & Gynecology

## 2015-06-15 ENCOUNTER — Telehealth: Payer: Self-pay | Admitting: Emergency Medicine

## 2015-06-15 ENCOUNTER — Ambulatory Visit
Admission: RE | Admit: 2015-06-15 | Discharge: 2015-06-15 | Disposition: A | Discharge status: Home / Self Care | Payer: BC Managed Care – PPO | Source: Ambulatory Visit | Attending: Obstetrics & Gynecology | Admitting: Obstetrics & Gynecology

## 2015-06-15 ENCOUNTER — Encounter: Payer: Self-pay | Admitting: Obstetrics & Gynecology

## 2015-06-15 VITALS — BP 130/80 | HR 82 | Resp 16 | Wt 263.4 lb

## 2015-06-15 DIAGNOSIS — R1031 Right lower quadrant pain: Secondary | ICD-10-CM

## 2015-06-15 DIAGNOSIS — R102 Pelvic and perineal pain: Secondary | ICD-10-CM | POA: Diagnosis not present

## 2015-06-15 DIAGNOSIS — N926 Irregular menstruation, unspecified: Secondary | ICD-10-CM | POA: Diagnosis not present

## 2015-06-15 DIAGNOSIS — F17200 Nicotine dependence, unspecified, uncomplicated: Secondary | ICD-10-CM

## 2015-06-15 DIAGNOSIS — Z720 Tobacco use: Secondary | ICD-10-CM | POA: Diagnosis not present

## 2015-06-15 LAB — POCT URINE PREGNANCY: PREG TEST UR: NEGATIVE

## 2015-06-15 LAB — POCT URINALYSIS DIPSTICK
Bilirubin, UA: NEGATIVE
Blood, UA: NEGATIVE
Glucose, UA: NEGATIVE
Ketones, UA: NEGATIVE
LEUKOCYTES UA: NEGATIVE
NITRITE UA: NEGATIVE
PH UA: 5
PROTEIN UA: NEGATIVE
UROBILINOGEN UA: NEGATIVE

## 2015-06-15 LAB — FOLLICLE STIMULATING HORMONE: FSH: 4.2 m[IU]/mL

## 2015-06-15 LAB — CBC
HCT: 47.3 % — ABNORMAL HIGH (ref 35.0–45.0)
HEMOGLOBIN: 15.9 g/dL — AB (ref 11.7–15.5)
MCH: 32.1 pg (ref 27.0–33.0)
MCHC: 33.6 g/dL (ref 32.0–36.0)
MCV: 95.4 fL (ref 80.0–100.0)
MPV: 10.2 fL (ref 7.5–12.5)
Platelets: 266 10*3/uL (ref 140–400)
RBC: 4.96 MIL/uL (ref 3.80–5.10)
RDW: 13.7 % (ref 11.0–15.0)
WBC: 8.4 10*3/uL (ref 3.8–10.8)

## 2015-06-15 LAB — PROLACTIN: PROLACTIN: 9.2 ng/mL

## 2015-06-15 LAB — HCG, QUANTITATIVE, PREGNANCY

## 2015-06-15 NOTE — Telephone Encounter (Signed)
Called pt. Ultrasound report given to pt.  HCG is not back.  Will watch for this and notify pt tomorrow.  May need provera challenge.  Will await blood work and proceed after that is back.  Pt voiced clear understanding.  Precautions for going to the ER given.  Voiced understanding.

## 2015-06-15 NOTE — Progress Notes (Signed)
GYNECOLOGY  VISIT   HPI: 40 y.o. G75P2002 Married Caucasian female here with complaint of missing her cycle in May.  Pt reports she had some cramping when the cycle was due and that subsided but then she started having right sided pain.  After a week, then she started to worsening RLQ pain as well as low back pain.  Denies vaginal bleeding or discharge.  Pt reports she is having increased urgency associated with this pain.  Denies fever.  She reports some increased constipation.  Pt is very concerned she may have an ectopic pregnancy.  She has hx of BTL and also has negative UPT today.  Pt states with first pregnancy, urine pregnancy tests were negative and she needed blood testing to prove pregnancy.     D/W pt evaluation for missing menstrual cycle.  Ultrasound evaluation due to pain also discussed.  Pt would like to have this done today if at all possible.    Patient's last menstrual period was 05/07/2015.    GYNECOLOGIC HISTORY: Patient's last menstrual period was 05/07/2015. Contraception: BTL   Patient Active Problem List   Diagnosis Date Noted  . Anxiety and depression 04/16/2015  . Chest pain 04/16/2015  . Fibroids, subserous 05/07/2014  . Fecal incontinence 10/09/2013  . Dyspareunia 10/09/2013  . Stress incontinence 09/24/2013  . Hypothyroid 09/09/2012  . Back pain 09/09/2012  . 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 Pelvic pain in female 08/19/2010    Past Medical History  Diagnosis Date  . Hypothyroidism   . Anxiety   . Fibromyalgia   . Sleep apnea     pre lap banding-never used cpap or mask  . Back pain     Occ. issues wih back pain  . Acute appendicitis 09/09/2012  . Hypothyroid 09/09/2012  . Back pain 09/09/2012    It is intermittent now,  previously source of fibromyalgia dx.      Past Surgical History  Procedure Laterality Date  . Cesarean section  1997, 2013  . Laparoscopic gastric banding  1997  . Knee surgery    . Laparoscopy  08/19/2010    Procedure:  LAPAROSCOPY OPERATIVE;  Surgeon: Felipa Emory;  Location: [facility];  Service: Gynecology;  Laterality: N/A;  with Biopsy of uterine serosa  . Tubal ligation      8/13  . Gastric banding port revision  01/20/2012    Procedure: GASTRIC BANDING PORT REVISION;  Surgeon: Pedro Earls, MD;  Location: [facility];  Service: General;  Laterality: N/A;  . Laparoscopic appendectomy N/A 09/09/2012    Procedure: APPENDECTOMY LAPAROSCOPIC;  Surgeon: Merrie Roof, MD;  Location: [facility];  Service: General;  Laterality: N/A;  . Knee arthroscopy with drilling/microfracture Right 12/06/2014    Procedure: KNEE ARTHROSCOPY WITH DRILLING/MICROFRACTURE, CHONDROPLASTY, EXCISION OF PLICA;  Surgeon: Dorna Leitz, MD;  Location: [facility];  Service: Orthopedics;  Laterality: Right;  . Appendectomy      MEDS:  Reviewed in EPIC and UTD  ALLERGIES: Paroxetine hcl; Percocet; and Codeine  Family History  Problem Relation Age of Onset  . Hypertension Maternal Grandfather   . Diabetes Paternal Grandmother   . Anesthesia problems Neg Hx    SH:  Married, smoking 1PPD   Review of Systems  Constitutional: Negative for fever, chills and weight loss.  Gastrointestinal: Positive for abdominal pain (rlq) and constipation (worse over last week).  All other systems reviewed and are negative.   PHYSICAL EXAMINATION:    BP 130/80 mmHg  Pulse 82  Resp 16  Wt 263 lb 6.4 oz (119.477 kg)  LMP 05/07/2015    General appearance: alert, cooperative and appears stated age CV:  Regular rate and rhythm Lungs:  clear to auscultation, no wheezes, rales or rhonchi, symmetric air entry Abdomen: soft, mild RLQ and LLQ tenderness, bowel sounds normal; no masses,  no organomegaly, no hernias, no rebound or guarding  Pelvic: External genitalia:  no lesions              Urethra:  normal appearing urethra with no masses, tenderness or lesions              Bartholins and Skenes: normal                 Vagina: normal  appearing vagina with normal color and discharge, no lesions              Cervix: no lesions              Bimanual Exam:  Uterus:  normal size and contour, no CMT              Adnexa: right adnexal tenderness, no masses palpated, left adnexa normal and non tender              Anus:  normal sphincter tone, no lesions  Chaperone was present for exam.  Assessment: Skipped menstrual cycle Concern by pt for ectopic pregnancy even with hx of BTL and neg UPT today Smoker Known hx of fibroid  Plan: Plan PUS today.  Will try to have this set up for pt today if possible.  Do not feel pt has an acute abdomen so if cannot be scheduled today, I feel it is safe to wait until early next week.  Precautions for going to the ER given to pt. HCG, prolactin, CBC, FSH   ~25 minutes spent with patient >50% of time was in face to face discussion of above.  Very specifically discussion was around ectopic risk for this pt which are quite low today.  She is quite anxious that this is the problem.

## 2015-06-15 NOTE — Telephone Encounter (Signed)
Stat Ultrasound results reviewed in EPIC, imaging tab.   Beta HCG results pending.  Results printed and to Dr. Ammie Ferrier Desk.

## 2015-06-16 ENCOUNTER — Other Ambulatory Visit: Payer: Self-pay | Admitting: *Deleted

## 2015-06-16 DIAGNOSIS — N859 Noninflammatory disorder of uterus, unspecified: Secondary | ICD-10-CM

## 2015-06-16 DIAGNOSIS — F17200 Nicotine dependence, unspecified, uncomplicated: Secondary | ICD-10-CM | POA: Insufficient documentation

## 2015-06-19 ENCOUNTER — Telehealth: Payer: Self-pay | Admitting: Emergency Medicine

## 2015-06-19 ENCOUNTER — Ambulatory Visit: Payer: BC Managed Care – PPO | Admitting: Obstetrics & Gynecology

## 2015-06-19 DIAGNOSIS — N859 Noninflammatory disorder of uterus, unspecified: Secondary | ICD-10-CM

## 2015-06-19 NOTE — Telephone Encounter (Signed)
Reviewed with Dr. Sabra Heck.  Patient started her cycle last night. Orders received for DC Endometrial biopsy that is scheduled for today. Pelvic ultrasound scheduled in 7-10 days. Order for Endometrial biopsy deferred until after ultrasound.   Call to patient and discussed. Patient agreeable. Cancelled appointment for today with Dr. Sabra Heck. Patient still wants to know why her cycle was late. Advised can be perimenopausal changes, stress, weight changes. She is offered to keep appointment today with Dr. Sabra Heck if she would like to further discuss and patient declines. Ultrasound scheduled for 06/28/15 with consult with Dr. Sabra Heck. Order for ultrasound placed. Patient advised to call back with any concerns or heavy bleeding. She is agreeable.  Routing to provider for final review. Patient agreeable to disposition. Will close encounter.  Cc [employer].

## 2015-06-19 NOTE — Telephone Encounter (Signed)
Patient called and started her cycle last night. Not heavy bleeding, normal cycle. Changing pad q 3-4 hours.

## 2015-06-26 ENCOUNTER — Other Ambulatory Visit: Payer: Self-pay | Admitting: Obstetrics & Gynecology

## 2015-06-26 NOTE — Telephone Encounter (Signed)
Medication refill request: Vitamin D 50000 units Last AEX:  04/03/15 SM Next AEX: 07/15/16 Last MMG (if hormonal medication request): none Refill authorized: 04/05/15 #12 w/0 refills; today please advise, does patient need to return for Vit D lab? Patient has appt here 06/28/15

## 2015-06-28 ENCOUNTER — Ambulatory Visit (INDEPENDENT_AMBULATORY_CARE_PROVIDER_SITE_OTHER): Payer: BC Managed Care – PPO | Admitting: Obstetrics & Gynecology

## 2015-06-28 ENCOUNTER — Encounter: Payer: Self-pay | Admitting: Obstetrics & Gynecology

## 2015-06-28 ENCOUNTER — Ambulatory Visit (INDEPENDENT_AMBULATORY_CARE_PROVIDER_SITE_OTHER): Payer: BC Managed Care – PPO

## 2015-06-28 VITALS — BP 132/76 | HR 76 | Resp 18 | Wt 263.0 lb

## 2015-06-28 DIAGNOSIS — D252 Subserosal leiomyoma of uterus: Secondary | ICD-10-CM

## 2015-06-28 DIAGNOSIS — R935 Abnormal findings on diagnostic imaging of other abdominal regions, including retroperitoneum: Secondary | ICD-10-CM | POA: Diagnosis not present

## 2015-06-28 DIAGNOSIS — N859 Noninflammatory disorder of uterus, unspecified: Secondary | ICD-10-CM

## 2015-06-28 DIAGNOSIS — N938 Other specified abnormal uterine and vaginal bleeding: Secondary | ICD-10-CM

## 2015-06-28 NOTE — Progress Notes (Signed)
40 y.o. Sally [name] here for a repeat pelvic ultrasound due to pelvic pain and low back pain with ultrasound obtained 06/15/15 showing thickened endometrium and fluid within cavity.  Ultrasound had the appearance of an early pregnancy with the fluid in the cavity but both UPT and serum HCG were negative.  Endometrial biopsy was recommended but her cycle started.  Flow was longer and heavier.  LMP 06/18/15 was late for pt by about two weeks with prior cyles starting 05/07/15.  Pt with many questions today--was I "sure" she wasn't pregnant?  Discussed with pt accuracy of UPT along with serum HCG so yes, I am sure she wasn't pregnant.  Also, pt has had a BTL since delivery of child 08/2011 when she had a cesarean section.  Pt also wants to know "why" this happened.  Anovulation is most likely cause.  Obesity may have a role in this as well.  Healthy lifestyle discussed.  Pt is a smoker as well so we cannot use OCPs for cycle regular.  D/W pt use of micronor if this continues.  Pt is open to this idea.  Last Pap 04/03/15, normal.    Patient's last menstrual period was 06/18/2015.  Sexually active:  yes  Contraception: bilateral tubal ligation  FINDINGS: UTERUS: 8.0 x 4.1 x 3.3cm with anterior subserosal fibroid measuring 1.1cm EMS: 6.56mm (Day 11 of cycle) ADNEXA:   Left ovary 2.1 x 1.9 x 1.6cm   Right ovary 2.0 x 1.5 x 0.9cm CUL DE SAC:  No free fluid  Discussion:  As endometrium is appropriate in thickness and appearance, do not feel biopsy is necessary.  If pt continues to have irregular bleeding, consider biopsy then and starting micronor.  Pt is comfortable with this plan and will call for follow-up or if anything changes.  Assessment:  History of missing last cycle by two weeks with abnormal ultrasound showing thickened endometrium and fluid in cavity, now with normal appearance Small anterior fibroid  Plan:  Pt knows to call if any cycles are late or heavy.  Will consider micronor and  possible endometrial biopsy then.  ~20 minutes spent with patient >50% of time was in face to face discussion of above.

## 2015-06-29 LAB — VITAMIN D 25 HYDROXY (VIT D DEFICIENCY, FRACTURES): Vit D, 25-Hydroxy: 24 ng/mL — ABNORMAL LOW (ref 30–100)

## 2015-07-01 DIAGNOSIS — N938 Other specified abnormal uterine and vaginal bleeding: Secondary | ICD-10-CM | POA: Insufficient documentation

## 2015-07-04 ENCOUNTER — Other Ambulatory Visit: Payer: BC Managed Care – PPO

## 2015-07-05 ENCOUNTER — Telehealth: Payer: Self-pay | Admitting: Obstetrics & Gynecology

## 2015-07-05 NOTE — Telephone Encounter (Signed)
Spoke with patient. Advised of message as seen below from [place]. Patient is agreeable and verbalizes understanding. Patient is requesting an appointment for next Monday 07/09/2015 or Friday 07/13/2015. Advised patient I will speak with nursing manager Lamont Snowball, RN regarding scheduling and return call. Patient is agreeable.

## 2015-07-05 NOTE — Telephone Encounter (Signed)
As long as the patient is not having pad change every 1 - 2 hours, she can wait until next week and see Dr. Sabra Heck for an endometrial biopsy and discussion of therapy options.  If heavy bleeding, bring her in for an office visit today/tomorrow at most.

## 2015-07-05 NOTE — Telephone Encounter (Signed)
Spoke with patient. Patient was seen last week for a repeat PUS due to pelvic pain and having a PUS on 06/15/2015 which showed a thickened endometrium with fluid within the cavity. Had a cycle on 06/18/2015. Reports she woke up this morning and has started bleeding again. She is unable to tell RN how often she is having to change her tampon at this time. "I just started bleeding." Patient has a BTL. Reports Dr.Miller has mentioned performing an EMB if her bleeding persisted. Patient would like to know if she needs to proceed with EMB or come in for another OV with Dr.Miller first. Advised I will speak with the covering provider as Dr.Miller is out of the office today and return call with further recommendations. She is agreeable.

## 2015-07-05 NOTE — Telephone Encounter (Signed)
Patient having some abnormal bleeding.

## 2015-07-06 NOTE — Telephone Encounter (Signed)
Spoke with patient. Offered appointment for Tuesday 6/20 at 3:15 (time per SY). Patient declines as she is taking her friend to chemo treatments. Appointment scheduled for 07/12/2015 at 3 pm with Dr.Miller. Patient is agreeable and verbalizes understanding. Advised she will need to take 800 mg of Ibuprofen/Motrin 1 hour prior to her appointment. She is agreeable. Order for EMB has been linked to the patient's appointment.  Cc: Dr.Miller  Routing to covering provider for final review. Patient agreeable to disposition. Will close encounter.

## 2015-07-08 ENCOUNTER — Encounter (HOSPITAL_COMMUNITY): Payer: Self-pay | Admitting: Emergency Medicine

## 2015-07-08 ENCOUNTER — Emergency Department (HOSPITAL_COMMUNITY)
Admission: EM | Admit: 2015-07-08 | Discharge: 2015-07-08 | Disposition: A | Discharge status: Home / Self Care | Payer: BC Managed Care – PPO | Attending: Emergency Medicine | Admitting: Emergency Medicine

## 2015-07-08 DIAGNOSIS — Z79899 Other long term (current) drug therapy: Secondary | ICD-10-CM | POA: Diagnosis not present

## 2015-07-08 DIAGNOSIS — Z791 Long term (current) use of non-steroidal anti-inflammatories (NSAID): Secondary | ICD-10-CM | POA: Insufficient documentation

## 2015-07-08 DIAGNOSIS — N390 Urinary tract infection, site not specified: Secondary | ICD-10-CM

## 2015-07-08 DIAGNOSIS — F17210 Nicotine dependence, cigarettes, uncomplicated: Secondary | ICD-10-CM | POA: Diagnosis not present

## 2015-07-08 LAB — URINALYSIS, ROUTINE W REFLEX MICROSCOPIC
Glucose, UA: NEGATIVE mg/dL
Ketones, ur: 15 mg/dL — AB
NITRITE: POSITIVE — AB
PH: 5.5 (ref 5.0–8.0)
Protein, ur: 100 mg/dL — AB
SPECIFIC GRAVITY, URINE: 1.028 (ref 1.005–1.030)

## 2015-07-08 LAB — COMPREHENSIVE METABOLIC PANEL
ALBUMIN: 3.5 g/dL (ref 3.5–5.0)
ALT: 13 U/L — ABNORMAL LOW (ref 14–54)
ANION GAP: 6 (ref 5–15)
AST: 16 U/L (ref 15–41)
Alkaline Phosphatase: 75 U/L (ref 38–126)
BILIRUBIN TOTAL: 0.6 mg/dL (ref 0.3–1.2)
BUN: 9 mg/dL (ref 6–20)
CO2: 23 mmol/L (ref 22–32)
Calcium: 9 mg/dL (ref 8.9–10.3)
Chloride: 106 mmol/L (ref 101–111)
Creatinine, Ser: 0.89 mg/dL (ref 0.44–1.00)
GFR calc non Af Amer: >60 mL/min (ref >60)
GLUCOSE: 96 mg/dL (ref 65–99)
POTASSIUM: 4.5 mmol/L (ref 3.5–5.1)
Sodium: 135 mmol/L (ref 135–145)
TOTAL PROTEIN: 6.7 g/dL (ref 6.5–8.1)

## 2015-07-08 LAB — URINE MICROSCOPIC-ADD ON

## 2015-07-08 LAB — CBC
HEMATOCRIT: 45.6 % (ref 36.0–46.0)
HEMOGLOBIN: 15.3 g/dL — AB (ref 12.0–15.0)
MCH: 31.2 pg (ref 26.0–34.0)
MCHC: 33.6 g/dL (ref 30.0–36.0)
MCV: 93.1 fL (ref 78.0–100.0)
Platelets: 281 10*3/uL (ref 150–400)
RBC: 4.9 MIL/uL (ref 3.87–5.11)
RDW: 13.3 % (ref 11.5–15.5)
WBC: 8.9 10*3/uL (ref 4.0–10.5)

## 2015-07-08 LAB — LIPASE, BLOOD: Lipase: 17 U/L (ref 11–51)

## 2015-07-08 LAB — PREGNANCY, URINE: Preg Test, Ur: NEGATIVE

## 2015-07-08 MED ORDER — NITROFURANTOIN MONOHYD MACRO 100 MG PO CAPS: 100.0000 mg | ORAL_CAPSULE | Freq: Two times a day (BID) | ORAL | Status: DC

## 2015-07-08 MED ORDER — KETOROLAC TROMETHAMINE 60 MG/2ML IM SOLN
60.0000 mg | Freq: Once | INTRAMUSCULAR | Status: AC
  Administered 2015-07-08: 60 mg via INTRAMUSCULAR
  Filled 2015-07-08: qty 2

## 2015-07-08 MED ORDER — PHENAZOPYRIDINE HCL 200 MG PO TABS: 200.0000 mg | ORAL_TABLET | Freq: Three times a day (TID) | ORAL | Status: DC

## 2015-07-08 NOTE — ED Provider Notes (Signed)
CSN: [id number]     Arrival date & time 07/08/15  1506 History   First MD Initiated Contact with Patient 07/08/15 1547     Chief Complaint  Patient presents with  . Urinary Tract Infection     (Consider location/radiation/quality/duration/timing/severity/associated sxs/prior Treatment) Patient is a 40 y.o. female presenting with urinary tract infection. The history is provided by the patient.  Urinary Tract Infection Pain quality:  Burning, stabbing and shooting Pain severity:  Moderate Onset quality:  Gradual Duration:  1 day Timing:  Constant Progression:  Worsening Chronicity:  New Recent urinary tract infections: no   Relieved by:  Nothing Exacerbated by: urinating. Ineffective treatments: AZO. Urinary symptoms: frequent urination and hesitancy   Associated symptoms: abdominal pain and vomiting   Associated symptoms: no fever and no flank pain   Associated symptoms comment:  Mild vaginal bleeding which has been on going and intermittent for the last 6-8 weeks Risk factors: no hx of pyelonephritis, no recurrent urinary tract infections, no sexually transmitted infections and no urinary catheter     Past Medical History  Diagnosis Date  . Hypothyroidism   . Anxiety   . Fibromyalgia   . Sleep apnea     pre lap banding-never used cpap or mask  . Back pain     Occ. issues wih back pain  . Acute appendicitis 09/09/2012  . Hypothyroid 09/09/2012  . Back pain 09/09/2012    It is intermittent now,  previously source of fibromyalgia dx.     Past Surgical History  Procedure Laterality Date  . Cesarean section  1997, 2013  . Laparoscopic gastric banding  1997  . Knee surgery    . Laparoscopy  08/19/2010    Procedure: LAPAROSCOPY OPERATIVE;  Surgeon: Felipa Emory;  Location: [facility];  Service: Gynecology;  Laterality: N/A;  with Biopsy of uterine serosa  . Tubal ligation      8/13  . Gastric banding port revision  01/20/2012    Procedure: GASTRIC BANDING PORT REVISION;   Surgeon: Pedro Earls, MD;  Location: [facility];  Service: General;  Laterality: N/A;  . Laparoscopic appendectomy N/A 09/09/2012    Procedure: APPENDECTOMY LAPAROSCOPIC;  Surgeon: Merrie Roof, MD;  Location: [facility];  Service: General;  Laterality: N/A;  . Knee arthroscopy with drilling/microfracture Right 12/06/2014    Procedure: KNEE ARTHROSCOPY WITH DRILLING/MICROFRACTURE, CHONDROPLASTY, EXCISION OF PLICA;  Surgeon: Dorna Leitz, MD;  Location: [facility];  Service: Orthopedics;  Laterality: Right;  . Appendectomy     Family History  Problem Relation Age of Onset  . Hypertension Maternal Grandfather   . Diabetes Paternal Grandmother   . Anesthesia problems Neg Hx    Social History  Substance Use Topics  . Smoking status: Current Every Day Smoker -- 1.00 packs/day    Types: Cigarettes  . Smokeless tobacco: Never Used  . Alcohol Use: No   OB History    Gravida Para Term Preterm AB TAB SAB Ectopic Multiple Living   2 2 2       2      Review of Systems  Constitutional: Negative for fever.  Gastrointestinal: Positive for vomiting and abdominal pain.  Genitourinary: Negative for flank pain.  All other systems reviewed and are negative.     Allergies  Paroxetine hcl; Percocet; and Codeine  Home Medications   Prior to Admission medications   Medication Sig Start Date End Date Taking? Authorizing Provider  albuterol (PROVENTIL HFA;VENTOLIN HFA) 108 (90 Base) MCG/ACT inhaler  Inhale 1 puff into the lungs every 6 (six) hours as needed for wheezing or shortness of breath.    Historical Provider, MD  clonazePAM (KLONOPIN) 0.5 MG tablet Take 1 tablet (0.5 mg total) by mouth 2 (two) times daily as needed for anxiety. 04/16/15   Midge Minium, MD  escitalopram (LEXAPRO) 10 MG tablet Take 1 tablet (10 mg total) by mouth daily. 06/13/15   Midge Minium, MD  levothyroxine (SYNTHROID, LEVOTHROID) 150 MCG tablet Take 1 tablet (150 mcg total) by mouth 2 (two) times  daily. 11/03/14   Darlyne Russian, MD  loratadine (CLARITIN) 10 MG tablet Take 10 mg by mouth daily as needed for allergies.    Historical Provider, MD  meloxicam (MOBIC) 15 MG tablet Take 15 mg by mouth daily.    Historical Provider, MD  metoprolol (LOPRESSOR) 50 MG tablet Take 1 tablet (50 mg total) by mouth 2 (two) times daily. 05/10/15   Midge Minium, MD  traMADol (ULTRAM) 50 MG tablet Take 1-2 tablets (50-100 mg total) by mouth every 6 (six) hours as needed for moderate pain. 12/06/14   Gary Fleet, PA-C  Vitamin D, Ergocalciferol, (DRISDOL) 50000 units CAPS capsule TAKE ONE CAPSULE EVERY 7 DAYS 06/27/15   [name], MD   BP 156/97 mmHg  Pulse 68  Temp(Src) 98 F (36.7 C) (Oral)  Resp 18  SpO2 99%  LMP 06/18/2015 Physical Exam  Constitutional: She is oriented to person, place, and time. She appears well-developed and well-nourished. She appears distressed.  HENT:  Head: Normocephalic and atraumatic.  Eyes: EOM are normal. Pupils are equal, round, and reactive to light.  Cardiovascular: Normal rate, regular rhythm, normal heart sounds and intact distal pulses.  Exam reveals no friction rub.   No murmur heard. Pulmonary/Chest: Effort normal and breath sounds normal. She has no wheezes. She has no rales.  Abdominal: Soft. Bowel sounds are normal. She exhibits no distension. There is tenderness in the suprapubic area. There is no rebound, no guarding and no CVA tenderness.  Musculoskeletal: Normal range of motion. She exhibits no tenderness.  No edema  Neurological: She is alert and oriented to person, place, and time. No cranial nerve deficit.  Skin: Skin is warm and dry. No rash noted.  Psychiatric: She has a normal mood and affect. Her behavior is normal.  Nursing note and vitals reviewed.   ED Course  Procedures (including critical care time) Labs Review Labs Reviewed  COMPREHENSIVE METABOLIC PANEL - Abnormal; Notable for the following:    ALT 13 (*)    All other  components within normal limits  CBC - Abnormal; Notable for the following:    Hemoglobin 15.3 (*)    All other components within normal limits  URINALYSIS, ROUTINE W REFLEX MICROSCOPIC (NOT AT [facility]) - Abnormal; Notable for the following:    Color, Urine ORANGE (*)    APPearance CLOUDY (*)    Hgb urine dipstick LARGE (*)    Bilirubin Urine SMALL (*)    Ketones, ur 15 (*)    Protein, ur 100 (*)    Nitrite POSITIVE (*)    Leukocytes, UA MODERATE (*)    All other components within normal limits  URINE MICROSCOPIC-ADD ON - Abnormal; Notable for the following:    Squamous Epithelial / LPF 0-5 (*)    Bacteria, UA FEW (*)    All other components within normal limits  LIPASE, BLOOD  PREGNANCY, URINE    Imaging Review No results found. I have  personally reviewed and evaluated these images and lab results as part of my medical decision-making.   EKG Interpretation None      MDM   Final diagnoses:  UTI (lower urinary tract infection)    Patient is a 40 year old female with history of hypothyroidism and prior appendectomy who presents today with dysuria, frequency, urgency that started yesterday. She has had intense pressure and discomfort in her suprapubic region. Also for the last 6-8 weeks she's had intermittent vaginal bleeding which she is scheduled a biopsy for this week. She states she's had some mild vaginal bleeding today but that has not been out of the ordinary for the last few months. Prior vaginal bleeding has not caused any symptoms like this.  Patient denies any fever but did have some vomiting earlier. She has no CVA tenderness or radiation of her pain. UA is a clean catch but appears to be infected with positive nitrites, leukocytes and 6-30 white blood cells. Few bacteria.  She also has suprapubic pain on exam.  CBC within normal limits, vital signs within normal limits except for mild hypertension at 156/97. Patient given Toradol for pain. She has already taken AZO.  Feel most likely patient's symptoms are related to a urinary tract infection. She does not have characteristics of kidney stone at this time. Her dysfunctional uterine bleeding does  seem to be related to her symptoms today. Urine pregnancy test neg. Pt given macrobid and pyridium. She will be given Macrobid and by radium for her symptoms. She was encouraged follow-up with GYN this week as planned.   Blanchie Dessert, MD 07/08/15 [phone]

## 2015-07-08 NOTE — ED Notes (Signed)
Pt sts UTI sx with burning with urination and vaginal bleeding; pt sts lower abd pain and N/V today

## 2015-07-09 ENCOUNTER — Telehealth: Payer: Self-pay | Admitting: Obstetrics & Gynecology

## 2015-07-09 NOTE — Telephone Encounter (Signed)
Please see telephone note dated 07/05/2015. Patient called in to report she had a cycle on 06/18/2015 and woke up on 07/05/2015 and had started bleeding again. Per review with Dr.Silva an EMB with Dr.Miller was recommended for this week. Patient would like to ensure Dr.Miller agrees with these recommendations.

## 2015-07-09 NOTE — Telephone Encounter (Signed)
Spoke with patient regarding benefit for endometrial biopsy. She is agreeable. While on phone, patient asked for return call from nurse to confirm if Dr Sabra Heck agrees with treatment plan. She states they spoke about possibly having test depending on results/symptoms. When patient scheduled, Dr Sabra Heck was not in office and covering provider recommended scheduling. Patient would like to make sure.

## 2015-07-10 NOTE — Telephone Encounter (Signed)
Spoke with patient. Advised I have spoken with Dr.Miller who recommends that she keep her appointment for EMB on 07/12/2015. Patient is agreeable and verbalizes understanding. Patient reports she was seen in in ER over the weekend for a UTI and is currently on antibiotics. Asking if this will interfere with her appointment. Advised this will not interfere with EMB. Patient reports she is starting to feel better with treatment. Denies any fever or chills. Will continue on antibiotics and contact the office if her symptoms worsen or change before her appointment on 07/12/2015.  Routing to provider for final review. Patient agreeable to disposition. Will close encounter.

## 2015-07-12 ENCOUNTER — Ambulatory Visit (INDEPENDENT_AMBULATORY_CARE_PROVIDER_SITE_OTHER): Payer: BC Managed Care – PPO | Admitting: Obstetrics & Gynecology

## 2015-07-12 ENCOUNTER — Encounter: Payer: Self-pay | Admitting: Obstetrics & Gynecology

## 2015-07-12 VITALS — BP 130/90 | HR 78 | Resp 16 | Ht 67.0 in | Wt 261.4 lb

## 2015-07-12 DIAGNOSIS — D252 Subserosal leiomyoma of uterus: Secondary | ICD-10-CM | POA: Diagnosis not present

## 2015-07-12 DIAGNOSIS — N859 Noninflammatory disorder of uterus, unspecified: Secondary | ICD-10-CM | POA: Diagnosis not present

## 2015-07-12 DIAGNOSIS — Z720 Tobacco use: Secondary | ICD-10-CM

## 2015-07-12 DIAGNOSIS — N938 Other specified abnormal uterine and vaginal bleeding: Secondary | ICD-10-CM

## 2015-07-12 DIAGNOSIS — F17200 Nicotine dependence, unspecified, uncomplicated: Secondary | ICD-10-CM

## 2015-07-13 NOTE — Progress Notes (Signed)
GYNECOLOGY  VISIT   HPI: 40 y.o. G32P2002 Married Caucasian female with irregular bleeding here for continued evaluation.  Pt skipped cycle in May and had lower back pain.  PUS on 06/15/15 showed thickened endometrium with fluid in the cavity.  Pt started her cycle on 06/18/15.  Repeat PUS showed endometrium of 6.43mm.  Pt's bleeding was heavier and longer.  It stopped for several days and then restarted "like a normal period" on 07/03/15.  Pt continues to ask "why this is happening".  We have reviewed possibility of anovulation due to obesity, stressors, and even age.  Pt does not want to be on any hormonal method to regulate cycles.  Discussed with pt possible IUD use (again she declines), endometrial ablation, and hysterectomy (which she declines), so options are somewhat limited.  Pt states she would just like this to "fix itself".  Weight loss discussed.  H/O lap band but pt hasn't had it filled since her pregnancy and her son is almost 56 years old.  GYNECOLOGIC HISTORY: Patient's last menstrual period was 07/03/2015. Contraception: BTL  Patient Active Problem List   Diagnosis Date Noted  . DUB (dysfunctional uterine bleeding) 07/01/2015  . Smoker 06/16/2015  . Anxiety and depression 04/16/2015  . Chest pain 04/16/2015  . Fibroids, subserous 05/07/2014  . Fecal incontinence 10/09/2013  . Dyspareunia 10/09/2013  . Stress incontinence 09/24/2013  . Hypothyroid 09/09/2012  . Back pain 09/09/2012  . 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 Pelvic pain in female 08/19/2010    Past Medical History  Diagnosis Date  . Hypothyroidism   . Anxiety   . Fibromyalgia   . Sleep apnea     pre lap banding-never used cpap or mask  . Back pain     Occ. issues wih back pain  . Acute appendicitis 09/09/2012  . Hypothyroid 09/09/2012  . Back pain 09/09/2012    It is intermittent now,  previously source of fibromyalgia dx.      Past Surgical History  Procedure Laterality Date  . Cesarean section   1997, 2013  . Laparoscopic gastric banding  1997  . Knee surgery    . Laparoscopy  08/19/2010    Procedure: LAPAROSCOPY OPERATIVE;  Surgeon: Felipa Emory;  Location: [facility];  Service: Gynecology;  Laterality: N/A;  with Biopsy of uterine serosa  . Tubal ligation      8/13  . Gastric banding port revision  01/20/2012    Procedure: GASTRIC BANDING PORT REVISION;  Surgeon: Pedro Earls, MD;  Location: [facility];  Service: General;  Laterality: N/A;  . Laparoscopic appendectomy N/A 09/09/2012    Procedure: APPENDECTOMY LAPAROSCOPIC;  Surgeon: Merrie Roof, MD;  Location: [place];  Service: General;  Laterality: N/A;  . Knee arthroscopy with drilling/microfracture Right 12/06/2014    Procedure: KNEE ARTHROSCOPY WITH DRILLING/MICROFRACTURE, CHONDROPLASTY, EXCISION OF PLICA;  Surgeon: Dorna Leitz, MD;  Location: [facility];  Service: Orthopedics;  Laterality: Right;  . Appendectomy      MEDS:  Reviewed in EPIC and UTD  ALLERGIES: Paroxetine hcl; Percocet; and Codeine  Family History  Problem Relation Age of Onset  . Hypertension Maternal Grandfather   . Diabetes Paternal Grandmother   . Anesthesia problems Neg Hx     SH:  Smoker, married  Review of Systems  All other systems reviewed and are negative.   PHYSICAL EXAMINATION:    BP 130/90 mmHg  Pulse 78  Resp 16  Ht 5\' 7"  (1.702 m)  Wt  261 lb 6.4 oz (118.57 kg)  BMI 40.93 kg/m2  LMP 07/03/2015    General appearance: alert, cooperative and appears stated age Abdomen: soft, non-tender; bowel sounds normal; no masses,  no organomegaly  Pelvic: External genitalia:  no lesions              Urethra:  normal appearing urethra with no masses, tenderness or lesions              Bartholins and Skenes: normal                 Vagina: normal appearing vagina with normal color and discharge, no lesions              Cervix: no lesions              Bimanual Exam:  Uterus:  normal size, contour, position, consistency,  mobility, non-tender              Adnexa: no mass, fullness, tenderness              Anus:  normal sphincter tone, no lesions  Endometrial biopsy recommended.  Discussed with patient.  Verbal and written consent obtained.   Procedure:  Speculum placed.  Cervix visualized and cleansed with betadine prep.  A single toothed tenaculum was applied to the anterior lip of the cervix.  Endometrial pipelle was advanced through the cervix into the endometrial cavity without difficulty.  Pipelle passed to 8.0 cm.  Suction applied and pipelle removed with good tissue sample obtained.  Two passes performed.  Tenculum removed.  No bleeding noted.  Patient tolerated procedure well.  Chaperone was present for exam.  Assessment: DUB Recent UTI Obesity H/O endometrial fluid that resolved after cycle and follow up PUS H/O BTL Smoker  Plan: Biopsy results pending.  Pt will consider endometrial ablation at this this time as this is the only option that is non-hormonal and could help her bleeding

## 2015-07-17 ENCOUNTER — Telehealth: Payer: Self-pay | Admitting: Obstetrics & Gynecology

## 2015-07-17 MED ORDER — NORETHINDRONE 0.35 MG PO TABS: 1.0000 | ORAL_TABLET | Freq: Every day | ORAL | Status: DC

## 2015-07-17 NOTE — Telephone Encounter (Signed)
Patient is calling for recent biopsy results. °

## 2015-07-17 NOTE — Telephone Encounter (Signed)
Notes Recorded by [name], MD on 07/17/2015 at 7:13 AM Please let pt know that her endometrial biopsy was negative for abnormal cells. She can start Micronor if she'd like to try and regular her cycles. She is a smoker so cannot use estrogen. If she does start micronor, needs recheck 3 months.   Spoke with patient. Advised of result and message as seen above from Valentine. She is agreeable and verbalizes understanding. She would like to start on Micronor at this time. Rx for Micronor #1 2RF sent to pharmacy on file. Patient is agreeable. 3 month recheck scheduled for 10/19/2015 at 3:30 pm with Dr.Miller. Patient is agreeable to date and time.  Routing to provider for final review. Patient agreeable to disposition. Will close encounter.

## 2015-08-19 ENCOUNTER — Other Ambulatory Visit: Payer: Self-pay | Admitting: Family Medicine

## 2015-08-20 ENCOUNTER — Emergency Department (HOSPITAL_COMMUNITY)
Admission: EM | Admit: 2015-08-20 | Discharge: 2015-08-20 | Disposition: A | Discharge status: Home / Self Care | Payer: Worker's Compensation | Attending: Emergency Medicine | Admitting: Emergency Medicine

## 2015-08-20 ENCOUNTER — Emergency Department (HOSPITAL_COMMUNITY): Payer: Worker's Compensation

## 2015-08-20 ENCOUNTER — Encounter (HOSPITAL_COMMUNITY): Payer: Self-pay | Admitting: Emergency Medicine

## 2015-08-20 DIAGNOSIS — M25561 Pain in right knee: Secondary | ICD-10-CM | POA: Insufficient documentation

## 2015-08-20 DIAGNOSIS — Y929 Unspecified place or not applicable: Secondary | ICD-10-CM | POA: Diagnosis not present

## 2015-08-20 DIAGNOSIS — M25572 Pain in left ankle and joints of left foot: Secondary | ICD-10-CM | POA: Diagnosis not present

## 2015-08-20 DIAGNOSIS — M25562 Pain in left knee: Secondary | ICD-10-CM

## 2015-08-20 DIAGNOSIS — E039 Hypothyroidism, unspecified: Secondary | ICD-10-CM | POA: Insufficient documentation

## 2015-08-20 DIAGNOSIS — F17210 Nicotine dependence, cigarettes, uncomplicated: Secondary | ICD-10-CM | POA: Diagnosis not present

## 2015-08-20 DIAGNOSIS — W19XXXA Unspecified fall, initial encounter: Secondary | ICD-10-CM

## 2015-08-20 DIAGNOSIS — X503XXA Overexertion from repetitive movements, initial encounter: Secondary | ICD-10-CM | POA: Diagnosis not present

## 2015-08-20 DIAGNOSIS — Y999 Unspecified external cause status: Secondary | ICD-10-CM | POA: Diagnosis not present

## 2015-08-20 DIAGNOSIS — Z23 Encounter for immunization: Secondary | ICD-10-CM | POA: Insufficient documentation

## 2015-08-20 DIAGNOSIS — Y9301 Activity, walking, marching and hiking: Secondary | ICD-10-CM | POA: Insufficient documentation

## 2015-08-20 MED ORDER — OXYCODONE HCL 5 MG PO TABS: 5.0000 mg | ORAL_TABLET | Freq: Four times a day (QID) | ORAL | 0 refills | Status: DC | PRN

## 2015-08-20 MED ORDER — TETANUS-DIPHTH-ACELL PERTUSSIS 5-2.5-18.5 LF-MCG/0.5 IM SUSP
0.5000 mL | Freq: Once | INTRAMUSCULAR | Status: AC
  Administered 2015-08-20: 0.5 mL via INTRAMUSCULAR
  Filled 2015-08-20: qty 0.5

## 2015-08-20 MED ORDER — OXYCODONE HCL 5 MG PO TABS
5.0000 mg | ORAL_TABLET | Freq: Once | ORAL | Status: AC
  Administered 2015-08-20: 5 mg via ORAL
  Filled 2015-08-20: qty 1

## 2015-08-20 MED ORDER — BACITRACIN ZINC 500 UNIT/GM EX OINT
1.0000 "application " | TOPICAL_OINTMENT | Freq: Two times a day (BID) | CUTANEOUS | Status: DC
  Administered 2015-08-20: 1 via TOPICAL
  Filled 2015-08-20: qty 0.9

## 2015-08-20 NOTE — Telephone Encounter (Signed)
Spoke to patient, she has enough to last a few more days until Dr. Birdie Riddle returns.

## 2015-08-20 NOTE — ED Triage Notes (Addendum)
Was walking down steps this am, fell, abrasions to bilateral knees, worse on right, has a workman's comp case for knee replacement for right knee, states knee "gave out" on her-- same knee that is supposed to have knee replacement,. Not scheduled yet. Pain in both knees and left ankle- abrasions on both knees, left ankle

## 2015-08-20 NOTE — ED Provider Notes (Signed)
[facility] DEPT Provider Note   CSN: [id number] Arrival date & time: 08/20/15  1151  First Provider Contact:  First MD Initiated Contact with Patient 08/20/15 1503        History   Chief Complaint Chief Complaint  Patient presents with  . Fall  . Knee Pain    HPI Sally [name] is a 40 y.o. female.  Patient presents to the ED with a chief complaint of fall.  She states that her right knee gave out and she fell to the ground landing on her right knee, left knee, and twisting her left ankle.  She states that she is scheduled to get her knee replaced in the near future.  She has a brace and cane at home, but was walking without them today.  She complains of moderate pain.  The pain is worsened with movement and palpation.  There are no other associated symptoms.   The history is provided by the patient. No language interpreter was used.    Past Medical History:  Diagnosis Date  . Acute appendicitis 09/09/2012  . Anxiety   . Back pain    Occ. issues wih back pain  . Back pain 09/09/2012   It is intermittent now,  previously source of fibromyalgia dx.    . Fibromyalgia   . Hypothyroid 09/09/2012  . Hypothyroidism   . Sleep apnea    pre lap banding-never used cpap or mask    Patient Active Problem List   Diagnosis Date Noted  . DUB (dysfunctional uterine bleeding) 07/01/2015  . Smoker 06/16/2015  . Anxiety and depression 04/16/2015  . Chest pain 04/16/2015  . Fibroids, subserous 05/07/2014  . Fecal incontinence 10/09/2013  . Dyspareunia 10/09/2013  . Stress incontinence 09/24/2013  . Hypothyroid 09/09/2012  . Back pain 09/09/2012  . 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 Pelvic pain in female 08/19/2010    Past Surgical History:  Procedure Laterality Date  . APPENDECTOMY    . CESAREAN SECTION  1997, 2013  . GASTRIC BANDING PORT REVISION  01/20/2012   Procedure: GASTRIC BANDING PORT REVISION;  Surgeon: Pedro Earls, MD;  Location: [facility];  Service:  General;  Laterality: N/A;  . KNEE ARTHROSCOPY WITH DRILLING/MICROFRACTURE Right 12/06/2014   Procedure: KNEE ARTHROSCOPY WITH DRILLING/MICROFRACTURE, CHONDROPLASTY, EXCISION OF PLICA;  Surgeon: Dorna Leitz, MD;  Location: [facility];  Service: Orthopedics;  Laterality: Right;  . KNEE SURGERY    . LAPAROSCOPIC APPENDECTOMY N/A 09/09/2012   Procedure: APPENDECTOMY LAPAROSCOPIC;  Surgeon: Merrie Roof, MD;  Location: [facility];  Service: General;  Laterality: N/A;  . [place]  . LAPAROSCOPY  08/19/2010   Procedure: LAPAROSCOPY OPERATIVE;  Surgeon: Felipa Emory;  Location: [facility];  Service: Gynecology;  Laterality: N/A;  with Biopsy of uterine serosa  . TUBAL LIGATION     8/13    OB History    Gravida Para Term Preterm AB Living   2 2 2     2    SAB TAB Ectopic Multiple Live Births           1       Home Medications    Prior to Admission medications   Medication Sig Start Date End Date Taking? Authorizing Provider  albuterol (PROVENTIL HFA;VENTOLIN HFA) 108 (90 Base) MCG/ACT inhaler Inhale 1 puff into the lungs every 6 (six) hours as needed for wheezing or shortness of breath.    Historical Provider, MD  clonazePAM Bobbye [place]) 0.5  MG tablet Take 1 tablet (0.5 mg total) by mouth 2 (two) times daily as needed for anxiety. 04/16/15   Midge Minium, MD  escitalopram (LEXAPRO) 10 MG tablet Take 1 tablet (10 mg total) by mouth daily. 06/13/15   Midge Minium, MD  levothyroxine (SYNTHROID, LEVOTHROID) 150 MCG tablet Take 1 tablet (150 mcg total) by mouth 2 (two) times daily. 11/03/14   Darlyne Russian, MD  loratadine (CLARITIN) 10 MG tablet Take 10 mg by mouth daily as needed for allergies.    Historical Provider, MD  meloxicam (MOBIC) 15 MG tablet Take 15 mg by mouth daily.    Historical Provider, MD  metoprolol (LOPRESSOR) 50 MG tablet Take 1 tablet (50 mg total) by mouth 2 (two) times daily. 05/10/15   Midge Minium, MD  nitrofurantoin,  macrocrystal-monohydrate, (MACROBID) 100 MG capsule Take 1 capsule (100 mg total) by mouth 2 (two) times daily. 07/08/15   Blanchie Dessert, MD  norethindrone (MICRONOR,CAMILA,ERRIN) 0.35 MG tablet Take 1 tablet (0.35 mg total) by mouth daily. 07/17/15   [name], MD  traMADol (ULTRAM) 50 MG tablet Take 1-2 tablets (50-100 mg total) by mouth every 6 (six) hours as needed for moderate pain. 12/06/14   Gary Fleet, PA-C  Vitamin D, Ergocalciferol, (DRISDOL) 50000 units CAPS capsule TAKE ONE CAPSULE EVERY 7 DAYS 06/27/15   [name], MD    Family History Family History  Problem Relation Age of Onset  . Hypertension Maternal Grandfather   . Diabetes Paternal Grandmother   . Anesthesia problems Neg Hx     Social History Social History  Substance Use Topics  . Smoking status: Current Every Day Smoker    Packs/day: 1.00    Types: Cigarettes  . Smokeless tobacco: Never Used  . Alcohol use No     Allergies   Paroxetine hcl; Percocet [oxycodone-acetaminophen]; and Codeine   Review of Systems Review of Systems  Musculoskeletal: Positive for arthralgias.  All other systems reviewed and are negative.    Physical Exam Updated Vital Signs BP 112/61 (BP Location: Right Arm)   Pulse 61   Temp 98 F (36.7 C) (Oral)   Resp 18   Ht 5\' 7"  (1.702 m)   Wt 118.8 kg   LMP 08/07/2015 (Exact Date)   SpO2 99%   BMI 41.04 kg/m   Physical Exam Physical Exam  Constitutional: Pt appears well-developed and well-nourished. No distress.  HENT:  Head: Normocephalic and atraumatic.  Eyes: Conjunctivae are normal.  Neck: Normal range of motion.  Cardiovascular: Normal rate, regular rhythm and intact distal pulses.   Capillary refill < 3 sec  Pulmonary/Chest: Effort normal and breath sounds normal.  Musculoskeletal: Pt exhibits tenderness to palpation over anterior right and left knees, TTP over left ATFL, no significant swelling. Pt exhibits no edema.  ROM: 4/5 in bilateral knees, 5/5  bilateral ankles  Neurological: Pt  is alert. Coordination normal.  Sensation 5/5 Strength 5/5 throughout  Skin: Skin is warm and dry. Pt is not diaphoretic.  No tenting of the skin , abrasions to bilateral anterior knees Psychiatric: Pt has a normal mood and affect.  Nursing note and vitals reviewed.   ED Treatments / Results  Labs (all labs ordered are listed, but only abnormal results are displayed) Labs Reviewed - No data to display  EKG  EKG Interpretation None       Radiology Dg Knee Complete 4 Views Left  Result Date: 08/20/2015 CLINICAL DATA:  Left knee pain after fall  down stairs today. Initial encounter. EXAM: LEFT KNEE - COMPLETE 4+ VIEW COMPARISON:  None. FINDINGS: No evidence of fracture, dislocation, or joint effusion. Joint spaces are intact. Minimal osteophyte formation is noted medially. Soft tissues are unremarkable. IMPRESSION: Minimal degenerative changes as described above. No acute abnormality seen in the left knee. Electronically Signed   By: Marijo Conception, M.D.   On: 08/20/2015 14:00   Dg Knee Complete 4 Views Right  Result Date: 08/20/2015 CLINICAL DATA:  The patient's right leg gave way today while walking resulting in a fall and a right knee injury. Initial encounter. EXAM: RIGHT KNEE - COMPLETE 4+ VIEW COMPARISON:  None. FINDINGS: No acute bony or joint abnormality is identified. Mild osteophytosis is seen about all 3 compartments. Small joint effusion is noted. IMPRESSION: Negative for fracture. Tricompartmental osteoarthritis and small joint effusion. Electronically Signed   By: Inge Rise M.D.   On: 08/20/2015 14:01   Dg Foot Complete Left  Result Date: 08/20/2015 CLINICAL DATA:  Pts right leg gave out on her while walking down stairs today and she fell onto both knees and twisted left ankle; c/o laceration and swelling to right anterior knee; left anterior knee pain and bruising; left ankle and foot pain worsening when bearing weight. EXAM:  LEFT FOOT - COMPLETE 3+ VIEW COMPARISON:  None. FINDINGS: There is no evidence of fracture or dislocation. There is no evidence of arthropathy or other focal bone abnormality. Soft tissues are unremarkable. IMPRESSION: Negative. Electronically Signed   By: Nolon Nations M.D.   On: 08/20/2015 14:01    Procedures Procedures (including critical care time)  Medications Ordered in ED Medications  oxyCODONE (Oxy IR/ROXICODONE) immediate release tablet 5 mg (not administered)  bacitracin ointment 1 application (not administered)     Initial Impression / Assessment and Plan / ED Course  I have reviewed the triage vital signs and the nursing notes.  Pertinent labs & imaging results that were available during my care of the patient were reviewed by me and considered in my medical decision making (see chart for details).  Clinical Course    Patient with mechanical fall and bilateral knee pain and left ankle pain.  X-rays are negative for fracture.  Recommend that patient use her knee brace and cane at home.  Will give ankle ASO.  Patient is ambulatory with antalgic gait.  Ortho follow-up.  Tdap updated.  Wound care performed.  Final Clinical Impressions(s) / ED Diagnoses   Final diagnoses:  Fall, initial encounter  Knee pain, acute, left  Knee pain, acute, right  Ankle pain, left    New Prescriptions New Prescriptions   OXYCODONE (ROXICODONE) 5 MG IMMEDIATE RELEASE TABLET    Take 1 tablet (5 mg total) by mouth every 6 (six) hours as needed for severe pain.     Montine Circle, PA-C 08/20/15 [place], MD 08/23/15 2150

## 2015-08-20 NOTE — ED Notes (Signed)
Pt given ice pack for knee.

## 2015-08-22 NOTE — Telephone Encounter (Signed)
Last ov 05/10/15 Clonazepam last filled 04/16/15 #30 with 1

## 2015-10-19 ENCOUNTER — Ambulatory Visit (INDEPENDENT_AMBULATORY_CARE_PROVIDER_SITE_OTHER): Payer: BC Managed Care – PPO | Admitting: Obstetrics & Gynecology

## 2015-10-19 VITALS — BP 108/80 | HR 80 | Resp 16 | Ht 67.0 in | Wt 265.0 lb

## 2015-10-19 DIAGNOSIS — N938 Other specified abnormal uterine and vaginal bleeding: Secondary | ICD-10-CM | POA: Diagnosis not present

## 2015-10-19 DIAGNOSIS — N941 Unspecified dyspareunia: Secondary | ICD-10-CM

## 2015-10-19 DIAGNOSIS — E559 Vitamin D deficiency, unspecified: Secondary | ICD-10-CM | POA: Diagnosis not present

## 2015-10-19 DIAGNOSIS — R102 Pelvic and perineal pain: Secondary | ICD-10-CM

## 2015-10-19 MED ORDER — NORETHINDRONE 0.35 MG PO TABS: 1.0000 | ORAL_TABLET | Freq: Every day | ORAL | 2 refills | Status: DC

## 2015-10-19 NOTE — Progress Notes (Signed)
GYNECOLOGY  VISIT   HPI: 40 y.o. [id number] Married Caucasian female here for follow up on micronor.  Started the [employer] in July.  August's cycle was 4 days and light.  September's cycle was the 3rd-11th.  It was kind of like two cycles with no bleeding for a day or two.  She continues to have pain with intercourse.  Did see Ileana Roup once but just was not comfortable with the "intimacy" of the evaluation and additional treatment that would be needed.  Did not return for additional PT.  Pain all started after last delivery and is only related to deeper penetration.  She really would like this to go away.  D/W pt possible laparoscopy vs hysterectomy.  I really feel this is due to scar tissue as it only occurred after her delivery.  Pt aware this could be totally wrong but she does have some interest in considering this.  Not interested in being on long term hormonal therapy and is tired of having irregular bleeding.  Procedure, hospital stay, recovery all briefly discussed.  Information provided.  Pt would like some additional information regarding cost.    GYNECOLOGIC HISTORY: Patient's last menstrual period was 09/23/2015. Contraception: BTL. Menopausal hormone therapy: None  Patient Active Problem List   Diagnosis Date Noted  . DUB (dysfunctional uterine bleeding) 07/01/2015  . Smoker 06/16/2015  . Anxiety and depression 04/16/2015  . Chest pain 04/16/2015  . Fibroids, subserous 05/07/2014  . Fecal incontinence 10/09/2013  . Dyspareunia 10/09/2013  . Stress incontinence 09/24/2013  . Hypothyroid 09/09/2012  . Back pain 09/09/2012  . 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 Pelvic pain in female 08/19/2010    Past Medical History:  Diagnosis Date  . Acute appendicitis 09/09/2012  . Anxiety   . Back pain    Occ. issues wih back pain  . Back pain 09/09/2012   It is intermittent now,  previously source of fibromyalgia dx.    . Fibromyalgia   . Hypothyroid 09/09/2012  . Hypothyroidism    . Sleep apnea    pre lap banding-never used cpap or mask    Past Surgical History:  Procedure Laterality Date  . APPENDECTOMY    . CESAREAN SECTION  1997, 2013  . GASTRIC BANDING PORT REVISION  01/20/2012   Procedure: GASTRIC BANDING PORT REVISION;  Surgeon: Pedro Earls, MD;  Location: [facility];  Service: General;  Laterality: N/A;  . KNEE ARTHROSCOPY WITH DRILLING/MICROFRACTURE Right 12/06/2014   Procedure: KNEE ARTHROSCOPY WITH DRILLING/MICROFRACTURE, CHONDROPLASTY, EXCISION OF PLICA;  Surgeon: Dorna Leitz, MD;  Location: [facility];  Service: Orthopedics;  Laterality: Right;  . KNEE SURGERY    . LAPAROSCOPIC APPENDECTOMY N/A 09/09/2012   Procedure: APPENDECTOMY LAPAROSCOPIC;  Surgeon: Merrie Roof, MD;  Location: [facility];  Service: General;  Laterality: N/A;  . [place]  . LAPAROSCOPY  08/19/2010   Procedure: LAPAROSCOPY OPERATIVE;  Surgeon: Felipa Emory;  Location: [facility];  Service: Gynecology;  Laterality: N/A;  with Biopsy of uterine serosa  . TUBAL LIGATION     8/13    MEDS:  Reviewed in EPIC and UTD  ALLERGIES: Paroxetine hcl; Percocet [oxycodone-acetaminophen]; and Codeine  Family History  Problem Relation Age of Onset  . Hypertension Maternal Grandfather   . Diabetes Paternal Grandmother   . Anesthesia problems Neg Hx     SH:  Married smoker  Review of Systems  Constitutional: Positive for malaise/fatigue.  Genitourinary:  Pain with Intercourse Loss of sexual interest   Musculoskeletal: Positive for myalgias.  All other systems reviewed and are negative.   PHYSICAL EXAMINATION:    BP 108/80 (BP Location: Right Arm, Patient Position: Sitting, Cuff Size: Large)   Pulse 80   Resp 16   Ht 5\' 7"  (1.702 m)   Wt 265 lb (120.2 kg)   LMP 09/23/2015   BMI 41.50 kg/m     General appearance: alert, cooperative and appears stated age No other exam performed.  Assessment: DUB, on micronor Dyspareunia that  started after last delivery, question adhesions Obesity H/O lap band placement Vit D def  Plan: For now will continue micornor.  Rx to pharmacy. Will precert hysterectomy. Recheck Vit D level today.   ~20 minutes spent with patient >50% of time was in face to face discussion of above.

## 2015-10-20 ENCOUNTER — Encounter: Payer: Self-pay | Admitting: Obstetrics & Gynecology

## 2015-10-20 LAB — VITAMIN D 25 HYDROXY (VIT D DEFICIENCY, FRACTURES): VIT D 25 HYDROXY: 21 ng/mL — AB (ref 30–100)

## 2015-10-22 NOTE — Addendum Note (Signed)
Addended by: [name] on: 10/22/2015 12:56 PM   Modules accepted: Orders

## 2015-10-23 ENCOUNTER — Telehealth: Payer: Self-pay | Admitting: *Deleted

## 2015-10-23 MED ORDER — VITAMIN D (ERGOCALCIFEROL) 1.25 MG (50000 UNIT) PO CAPS: 50000.0000 [IU] | ORAL_CAPSULE | ORAL | 0 refills | Status: DC

## 2015-10-23 NOTE — Telephone Encounter (Signed)
Call to patient. Patient notified of vitamin d results. Vitamin D prescription sent to [facility] on [address] per patient preference. 12 week lab recheck scheduled for Monday 01/07/16 at 1000. Patient agreeable to date and time of appointment. Future order is present for lab.    Routing to provider for final review. Patient agreeable to disposition. Will close encounter.

## 2015-10-23 NOTE — Telephone Encounter (Signed)
-----   Message from [name], MD sent at 10/22/2015 12:49 PM EDT ----- Please let pt know her VIt D is low.  She needs to start 50,000 IU weekly for 12 weeks and have this repeated.  I have entered order for lab work but not rx.  Thanks.

## 2015-10-25 ENCOUNTER — Telehealth: Payer: Self-pay | Admitting: *Deleted

## 2015-10-26 ENCOUNTER — Encounter (HOSPITAL_COMMUNITY): Payer: Self-pay

## 2015-10-29 NOTE — Telephone Encounter (Signed)
Patient says she is calling to discuss surgery. °

## 2015-10-30 ENCOUNTER — Ambulatory Visit (INDEPENDENT_AMBULATORY_CARE_PROVIDER_SITE_OTHER): Payer: BC Managed Care – PPO | Admitting: Obstetrics & Gynecology

## 2015-10-30 ENCOUNTER — Encounter (HOSPITAL_COMMUNITY)
Admission: RE | Admit: 2015-10-30 | Discharge: 2015-10-30 | Disposition: A | Discharge status: Home / Self Care | Payer: BC Managed Care – PPO | Source: Ambulatory Visit | Attending: Obstetrics & Gynecology | Admitting: Obstetrics & Gynecology

## 2015-10-30 ENCOUNTER — Encounter (HOSPITAL_COMMUNITY): Payer: Self-pay

## 2015-10-30 ENCOUNTER — Other Ambulatory Visit: Payer: Self-pay | Admitting: Obstetrics & Gynecology

## 2015-10-30 VITALS — BP 118/80 | HR 82 | Resp 16 | Ht 67.0 in | Wt 266.0 lb

## 2015-10-30 DIAGNOSIS — Z01812 Encounter for preprocedural laboratory examination: Secondary | ICD-10-CM | POA: Diagnosis present

## 2015-10-30 DIAGNOSIS — N941 Unspecified dyspareunia: Secondary | ICD-10-CM

## 2015-10-30 DIAGNOSIS — N938 Other specified abnormal uterine and vaginal bleeding: Secondary | ICD-10-CM | POA: Diagnosis not present

## 2015-10-30 DIAGNOSIS — D259 Leiomyoma of uterus, unspecified: Secondary | ICD-10-CM | POA: Diagnosis not present

## 2015-10-30 DIAGNOSIS — R102 Pelvic and perineal pain: Secondary | ICD-10-CM | POA: Insufficient documentation

## 2015-10-30 DIAGNOSIS — D252 Subserosal leiomyoma of uterus: Secondary | ICD-10-CM | POA: Diagnosis not present

## 2015-10-30 HISTORY — DX: Vitamin D deficiency, unspecified: E55.9

## 2015-10-30 HISTORY — DX: Headache: R51

## 2015-10-30 HISTORY — DX: Unspecified osteoarthritis, unspecified site: M19.90

## 2015-10-30 HISTORY — DX: Headache, unspecified: R51.9

## 2015-10-30 HISTORY — DX: Dysplasia of cervix uteri, unspecified: N87.9

## 2015-10-30 HISTORY — DX: Unspecified asthma, uncomplicated: J45.909

## 2015-10-30 LAB — CBC
HEMATOCRIT: 42.2 % (ref 36.0–46.0)
HEMOGLOBIN: 14.9 g/dL (ref 12.0–15.0)
MCH: 32.7 pg (ref 26.0–34.0)
MCHC: 35.3 g/dL (ref 30.0–36.0)
MCV: 92.5 fL (ref 78.0–100.0)
Platelets: 269 10*3/uL (ref 150–400)
RBC: 4.56 MIL/uL (ref 3.87–5.11)
RDW: 13.2 % (ref 11.5–15.5)
WBC: 8.5 10*3/uL (ref 4.0–10.5)

## 2015-10-30 LAB — TYPE AND SCREEN
ABO/RH(D): A POS
ANTIBODY SCREEN: NEGATIVE

## 2015-10-30 MED ORDER — OXYCODONE HCL 5 MG PO TABS: 5.0000 mg | ORAL_TABLET | ORAL | 0 refills | Status: DC | PRN

## 2015-10-30 MED ORDER — IBUPROFEN 800 MG PO TABS: 800.0000 mg | ORAL_TABLET | Freq: Three times a day (TID) | ORAL | 0 refills | Status: DC | PRN

## 2015-10-30 NOTE — Progress Notes (Signed)
40 y.o. [id number] MarriedCaucasian female here for discussion of upcoming procedure on 11/05/15.  Pt seen 10/19/15 and at that time we discussed hysterectomy due to persistent pelvic pain that occurs with intercourse and started after her second Cesarean section.  It is located on the left side and there is tenderness on her exam near the lower portion of her uterus.  She is not interested in laparoscopy for diagnosis of this.  She's been experieicng DUB that has not been controlled with POPs.  She is tired of thsi as well.  After last visit, pt and spouse considered options and she decided on proceeding with definitive management.  There was an opening on Monday she so was scheduled for this.    Procedure discussed with patient.  Hospital stay, recovery and pain management all discussed.  Risks discussed including but not limited to bleeding, 1% risk of receiving a  transfusion, infection, 3-4% risk of bowel/bladder/ureteral/vascular injury discussed as well as possible need for additional surgery if injury does occur discussed.  DVT/PE and rare risk of death discussed.  My actual complications with prior surgeries discussed.  Vaginal cuff dehiscence discussed.  Hernia formation discussed.  Positioning and incision locations discussed.  Patient aware if pathology abnormal she may need additional treatment.  All questions answered.    Ob Hx:   Patient's last menstrual period was 10/26/2015 (exact date).          Sexually active: Yes.   Birth control: bilateral tubal ligation Last pap: 04/03/15 Neg with neg HR HPV Last MMG: None Tobacco: yes  Past Surgical History:  Procedure Laterality Date  . APPENDECTOMY    . BRAIN SURGERY  1992   remove blood clot after a fall  . CESAREAN SECTION  1997, 2013  . GASTRIC BANDING PORT REVISION  01/20/2012   Procedure: GASTRIC BANDING PORT REVISION;  Surgeon: Pedro Earls, MD;  Location: [facility];  Service: General;  Laterality: N/A;  . KNEE ARTHROSCOPY WITH  DRILLING/MICROFRACTURE Right 12/06/2014   Procedure: KNEE ARTHROSCOPY WITH DRILLING/MICROFRACTURE, CHONDROPLASTY, EXCISION OF PLICA;  Surgeon: Dorna Leitz, MD;  Location: [facility];  Service: Orthopedics;  Laterality: Right;  . KNEE SURGERY    . LAPAROSCOPIC APPENDECTOMY N/A 09/09/2012   Procedure: APPENDECTOMY LAPAROSCOPIC;  Surgeon: Merrie Roof, MD;  Location: [facility];  Service: General;  Laterality: N/A;  . [place]  . LAPAROSCOPY  08/19/2010   Procedure: LAPAROSCOPY OPERATIVE;  Surgeon: Felipa Emory;  Location: [facility];  Service: Gynecology;  Laterality: N/A;  with Biopsy of uterine serosa  . TUBAL LIGATION     8/13    Past Medical History:  Diagnosis Date  . Acute appendicitis 09/09/2012  . Anxiety   . Asthma   . Back pain    Occ. issues wih back pain  . Back pain 09/09/2012   It is intermittent now,  previously source of fibromyalgia dx.    . Fibromyalgia   . Headache   . Hypothyroid 09/09/2012  . Hypothyroidism   . Osteoarthritis   . Precancerous changes of the cervix   . Sleep apnea    pre lap banding-never used cpap or mask  . Vitamin D deficiency     Allergies: Paroxetine hcl; Percocet [oxycodone-acetaminophen]; and Codeine  Current Outpatient Prescriptions  Medication Sig Dispense Refill  . albuterol (PROVENTIL HFA;VENTOLIN HFA) 108 (90 Base) MCG/ACT inhaler Inhale 1-2 puffs into the lungs every 6 (six) hours as needed for wheezing or shortness of breath.     Marland Kitchen  clonazePAM (KLONOPIN) 0.5 MG tablet Take 0.5 mg by mouth 2 (two) times daily as needed for anxiety.    Marland Kitchen escitalopram (LEXAPRO) 10 MG tablet Take 1 tablet (10 mg total) by mouth daily. 90 tablet 1  . levothyroxine (SYNTHROID, LEVOTHROID) 150 MCG tablet Take 1 tablet (150 mcg total) by mouth 2 (two) times daily. 180 tablet 3  . loratadine (CLARITIN) 10 MG tablet Take 10 mg by mouth daily as needed for allergies.    . meloxicam (MOBIC) 15 MG tablet Take 15 mg by mouth  daily.     . norethindrone (MICRONOR,CAMILA,ERRIN) 0.35 MG tablet Take 1 tablet (0.35 mg total) by mouth daily. 3 Package 2  . oxyCODONE (ROXICODONE) 5 MG immediate release tablet Take 1 tablet (5 mg total) by mouth every 6 (six) hours as needed for severe pain. 5 tablet 0  . traMADol (ULTRAM) 50 MG tablet Take 1-2 tablets (50-100 mg total) by mouth every 6 (six) hours as needed for moderate pain. 40 tablet 0  . Vitamin D, Ergocalciferol, (DRISDOL) 50000 units CAPS capsule Take 50,000 Units by mouth every Tuesday.     No current facility-administered medications for this visit.     ROS: Pertinent items noted in HPI and remainder of comprehensive ROS otherwise negative.  Exam:    BP 118/80 (BP Location: Right Arm, Patient Position: Sitting, Cuff Size: Normal)   Pulse 82   Resp 16   Ht 5\' 7"  (1.702 m)   Wt 266 lb (120.7 kg)   LMP 10/26/2015 (Exact Date)   BMI 41.66 kg/m   General appearance: alert and cooperative Head: Normocephalic, without obvious abnormality, atraumatic Neck: no adenopathy, supple, symmetrical, trachea midline and thyroid not enlarged, symmetric, no tenderness/mass/nodules Lungs: clear to auscultation bilaterally Heart: regular rate and rhythm, S1, S2 normal, no murmur, click, rub or gallop Abdomen: soft, non-tender; bowel sounds normal; no masses,  no organomegaly Extremities: extremities normal, atraumatic, no cyanosis or edema Skin: Skin color, texture, turgor normal. No rashes or lesions Lymph nodes: Cervical, supraclavicular, and axillary nodes normal. no inguinal nodes palpated Neurologic: Grossly normal  Pelvic: External genitalia:  no lesions              Urethra: normal appearing urethra with no masses, tenderness or lesions              Bartholins and Skenes: normal                 Vagina: normal appearing vagina with normal color and discharge, no lesions              Cervix: normal appearance              Pap taken: No.        Bimanual Exam:   Uterus:  uterus is normal size, shape, consistency and nontender                                      Adnexa:    normal adnexa in size, nontender and no masses                                      Rectovaginal: Deferred  Anus:  no lesions  A: Chronic pelvic pain  Dyspareunia since last cesarean section DUB Small uterine fibroid     P:  TLH/bilateral salpingectomy/possible BSO and cystoscopy planned Rx for Motrin and oxycodone given. Medications/Vitamins reviewed.  Pt knows needs to stop any ASA products. Hysterectomy brochure given for pre and post op instructions.  ~30 minutes spent with patient >50% of time was in face to face discussion of above.

## 2015-10-30 NOTE — Telephone Encounter (Signed)
Patient has been schedule for surgery at [facility] at 10:45am on 11/05/15. Patient is aware she will receive a call from our triage department with specific surgery instructions as well as a call from [facility] Pre Op Dept.

## 2015-10-30 NOTE — Patient Instructions (Addendum)
Your procedure is scheduled on:  Monday, Oct. 16, 2017  Enter through the [employer] [facility] at:  9:15 AM  Pick up the phone at the desk and dial [phone].  Call this number if you have problems the morning of surgery: [phone].  Remember: Do NOT eat food or drink after:  Midnight Sunday  Take these medicines the morning of surgery with a SIP OF WATER:  Levothyroxine   Bring Asthma Inhaler day of surgery  Stop ALL herbal medications at this time  Do NOT smoke the day of surgery  Do NOT wear jewelry (body piercing), metal hair clips/bobby pins, make-up, or nail polish. Do NOT wear lotions, powders, or perfumes.  You may wear deodorant. Do NOT shave for 48 hours prior to surgery. Do NOT bring valuables to the hospital. Contacts, dentures, or bridgework may not be worn into surgery.  Leave suitcase in car.  After surgery it may be brought to your room.  For patients admitted to the hospital, checkout time is 11:00 AM the day of discharge.

## 2015-10-31 ENCOUNTER — Telehealth: Payer: Self-pay

## 2015-10-31 ENCOUNTER — Encounter: Payer: Self-pay | Admitting: Obstetrics & Gynecology

## 2015-10-31 NOTE — Telephone Encounter (Signed)
Spoke with patient. Patient is scheduled to have [place] on 11/05/2015. 1 week post op appointment scheduled for 11/13/2015 at 3:30 pm with Dr.Miller. 4 Week post op appointment scheduled for 12/07/15 at 8:30 am with Dr.Miller. Pre-operative instructions given. Discontinue use of Aspirin, Vitamin E, and any herbal supplements at this time. Discontinue Aleve on 12/03/2015. Discontinue Motrin, Advil, Ibuprofen products 24 hours prior to surgery. No bowel prep is needed. Do not eat or drink anything after midnight the night before surgery. Dress casually the morning of surgery. Do not take any valuables with you, do not wear makeup, jewelry or lotion. Do not shave 48 hours prior to surgery. Advised if she has any questions she may contact the office at [phone]. Patient is agreeable. Surgery information form mailed to the patient's verified home address on file.  Routing to provider for final review. Patient agreeable to disposition. Will close encounter.

## 2015-11-01 ENCOUNTER — Other Ambulatory Visit: Payer: Self-pay | Admitting: Emergency Medicine

## 2015-11-04 MED ORDER — DEXTROSE 5 % IV SOLN
2.0000 g | INTRAVENOUS | Status: DC
  Filled 2015-11-04: qty 2

## 2015-11-04 MED ORDER — ENOXAPARIN SODIUM 40 MG/0.4ML ~~LOC~~ SOLN
40.0000 mg | SUBCUTANEOUS | Status: AC
  Administered 2015-11-05: 40 mg via SUBCUTANEOUS
  Filled 2015-11-04: qty 0.4

## 2015-11-05 ENCOUNTER — Encounter (HOSPITAL_COMMUNITY)
Admission: RE | Disposition: A | Discharge status: Home / Self Care | Payer: Self-pay | Source: Ambulatory Visit | Attending: Obstetrics & Gynecology

## 2015-11-05 ENCOUNTER — Ambulatory Visit (HOSPITAL_COMMUNITY): Payer: BC Managed Care – PPO | Admitting: Anesthesiology

## 2015-11-05 ENCOUNTER — Ambulatory Visit (HOSPITAL_COMMUNITY)
Admission: RE | Admit: 2015-11-05 | Discharge: 2015-11-06 | Disposition: A | Discharge status: Home / Self Care | Payer: BC Managed Care – PPO | Source: Ambulatory Visit | Attending: Obstetrics & Gynecology | Admitting: Obstetrics & Gynecology

## 2015-11-05 ENCOUNTER — Encounter (HOSPITAL_COMMUNITY): Payer: Self-pay | Admitting: *Deleted

## 2015-11-05 DIAGNOSIS — R102 Pelvic and perineal pain: Secondary | ICD-10-CM | POA: Diagnosis present

## 2015-11-05 DIAGNOSIS — N941 Unspecified dyspareunia: Secondary | ICD-10-CM | POA: Insufficient documentation

## 2015-11-05 DIAGNOSIS — G8929 Other chronic pain: Secondary | ICD-10-CM | POA: Diagnosis not present

## 2015-11-05 DIAGNOSIS — D252 Subserosal leiomyoma of uterus: Secondary | ICD-10-CM | POA: Diagnosis not present

## 2015-11-05 DIAGNOSIS — E039 Hypothyroidism, unspecified: Secondary | ICD-10-CM | POA: Diagnosis not present

## 2015-11-05 DIAGNOSIS — F17210 Nicotine dependence, cigarettes, uncomplicated: Secondary | ICD-10-CM | POA: Diagnosis not present

## 2015-11-05 DIAGNOSIS — D251 Intramural leiomyoma of uterus: Secondary | ICD-10-CM | POA: Diagnosis not present

## 2015-11-05 DIAGNOSIS — J45909 Unspecified asthma, uncomplicated: Secondary | ICD-10-CM | POA: Diagnosis not present

## 2015-11-05 DIAGNOSIS — N938 Other specified abnormal uterine and vaginal bleeding: Principal | ICD-10-CM | POA: Insufficient documentation

## 2015-11-05 HISTORY — PX: BILATERAL SALPINGECTOMY: SHX5743

## 2015-11-05 HISTORY — PX: LAPAROSCOPIC HYSTERECTOMY: SHX1926

## 2015-11-05 HISTORY — PX: CYSTOSCOPY: SHX5120

## 2015-11-05 LAB — PREGNANCY, URINE: PREG TEST UR: NEGATIVE

## 2015-11-05 SURGERY — HYSTERECTOMY, TOTAL, LAPAROSCOPIC
Anesthesia: General | Site: Bladder

## 2015-11-05 MED ORDER — SODIUM CHLORIDE 0.9 % IV SOLN
INTRAVENOUS | Status: DC | PRN
  Administered 2015-11-05: 60 mL

## 2015-11-05 MED ORDER — LACTATED RINGERS IV SOLN
INTRAVENOUS | Status: DC
  Administered 2015-11-05 (×2): via INTRAVENOUS

## 2015-11-05 MED ORDER — ROPIVACAINE HCL 5 MG/ML IJ SOLN
INTRAMUSCULAR | Status: AC
  Filled 2015-11-05: qty 30

## 2015-11-05 MED ORDER — KETOROLAC TROMETHAMINE 30 MG/ML IJ SOLN
30.0000 mg | Freq: Four times a day (QID) | INTRAMUSCULAR | Status: DC
  Administered 2015-11-05 – 2015-11-06 (×2): 30 mg via INTRAVENOUS
  Filled 2015-11-05 (×2): qty 1

## 2015-11-05 MED ORDER — LIDOCAINE HCL (CARDIAC) 20 MG/ML IV SOLN
INTRAVENOUS | Status: AC
  Filled 2015-11-05: qty 5

## 2015-11-05 MED ORDER — SODIUM CHLORIDE 0.9 % IJ SOLN
INTRAMUSCULAR | Status: AC
  Filled 2015-11-05: qty 50

## 2015-11-05 MED ORDER — HYDROMORPHONE HCL 1 MG/ML IJ SOLN
0.2500 mg | INTRAMUSCULAR | Status: DC | PRN
  Administered 2015-11-05: 0.5 mg via INTRAVENOUS

## 2015-11-05 MED ORDER — ACETAMINOPHEN 325 MG PO TABS: 650.0000 mg | ORAL_TABLET | ORAL | Status: DC | PRN

## 2015-11-05 MED ORDER — MENTHOL 3 MG MT LOZG
1.0000 | LOZENGE | OROMUCOSAL | Status: DC | PRN
Start: 2015-11-05 — End: 2015-11-06

## 2015-11-05 MED ORDER — ROCURONIUM BROMIDE 100 MG/10ML IV SOLN
INTRAVENOUS | Status: DC | PRN
  Administered 2015-11-05: 60 mg via INTRAVENOUS
  Administered 2015-11-05 (×2): 10 mg via INTRAVENOUS

## 2015-11-05 MED ORDER — ONDANSETRON HCL 4 MG/2ML IJ SOLN
INTRAMUSCULAR | Status: DC | PRN
  Administered 2015-11-05: 4 mg via INTRAVENOUS

## 2015-11-05 MED ORDER — LACTATED RINGERS IR SOLN
Status: DC | PRN
  Administered 2015-11-05: 3000 mL

## 2015-11-05 MED ORDER — FAMOTIDINE IN NACL 20-0.9 MG/50ML-% IV SOLN
20.0000 mg | Freq: Two times a day (BID) | INTRAVENOUS | Status: DC
  Filled 2015-11-05: qty 50

## 2015-11-05 MED ORDER — STERILE WATER FOR IRRIGATION IR SOLN
Status: DC | PRN
  Administered 2015-11-05: 1000 mL via INTRAVESICAL

## 2015-11-05 MED ORDER — ROCURONIUM BROMIDE 100 MG/10ML IV SOLN
INTRAVENOUS | Status: AC
  Filled 2015-11-05: qty 1

## 2015-11-05 MED ORDER — NEOSTIGMINE METHYLSULFATE 10 MG/10ML IV SOLN
INTRAVENOUS | Status: AC
  Filled 2015-11-05: qty 1

## 2015-11-05 MED ORDER — LIDOCAINE HCL (CARDIAC) 20 MG/ML IV SOLN
INTRAVENOUS | Status: DC | PRN
  Administered 2015-11-05: 80 mg via INTRAVENOUS

## 2015-11-05 MED ORDER — FENTANYL CITRATE (PF) 250 MCG/5ML IJ SOLN
INTRAMUSCULAR | Status: AC
  Filled 2015-11-05: qty 5

## 2015-11-05 MED ORDER — MIDAZOLAM HCL 2 MG/2ML IJ SOLN
INTRAMUSCULAR | Status: AC
  Filled 2015-11-05: qty 2

## 2015-11-05 MED ORDER — MORPHINE SULFATE (PF) 4 MG/ML IV SOLN
1.0000 mg | INTRAVENOUS | Status: DC | PRN
  Administered 2015-11-05: 2 mg via INTRAVENOUS
  Administered 2015-11-05: 1 mg via INTRAVENOUS
  Filled 2015-11-05 (×2): qty 1

## 2015-11-05 MED ORDER — DEXTROSE 5 % IV SOLN
INTRAVENOUS | Status: DC | PRN
  Administered 2015-11-05: 2 g via INTRAVENOUS

## 2015-11-05 MED ORDER — NEOSTIGMINE METHYLSULFATE 10 MG/10ML IV SOLN
INTRAVENOUS | Status: DC | PRN
  Administered 2015-11-05: 5 mg via INTRAVENOUS

## 2015-11-05 MED ORDER — FENTANYL CITRATE (PF) 250 MCG/5ML IJ SOLN
INTRAMUSCULAR | Status: AC
Start: 2015-11-05 — End: 2015-11-05
  Filled 2015-11-05: qty 5

## 2015-11-05 MED ORDER — LABETALOL HCL 5 MG/ML IV SOLN
INTRAVENOUS | Status: AC
  Filled 2015-11-05: qty 4

## 2015-11-05 MED ORDER — PROMETHAZINE HCL 25 MG/ML IJ SOLN: 6.2500 mg | INTRAMUSCULAR | Status: DC | PRN

## 2015-11-05 MED ORDER — GLYCOPYRROLATE 0.2 MG/ML IJ SOLN
INTRAMUSCULAR | Status: DC | PRN
  Administered 2015-11-05: .6 mg via INTRAVENOUS

## 2015-11-05 MED ORDER — SCOPOLAMINE 1 MG/3DAYS TD PT72
MEDICATED_PATCH | TRANSDERMAL | Status: AC
  Administered 2015-11-05: 1.5 mg via TRANSDERMAL
  Filled 2015-11-05: qty 1

## 2015-11-05 MED ORDER — LEVOTHYROXINE SODIUM 150 MCG PO TABS
150.0000 ug | ORAL_TABLET | Freq: Two times a day (BID) | ORAL | Status: DC
  Administered 2015-11-06: 150 ug via ORAL
  Filled 2015-11-05 (×2): qty 1

## 2015-11-05 MED ORDER — KETOROLAC TROMETHAMINE 30 MG/ML IJ SOLN
INTRAMUSCULAR | Status: AC
  Filled 2015-11-05: qty 1

## 2015-11-05 MED ORDER — BUPIVACAINE HCL (PF) 0.25 % IJ SOLN
INTRAMUSCULAR | Status: AC
  Filled 2015-11-05: qty 30

## 2015-11-05 MED ORDER — DEXTROSE-NACL 5-0.45 % IV SOLN: INTRAVENOUS | Status: DC

## 2015-11-05 MED ORDER — MIDAZOLAM HCL 2 MG/2ML IJ SOLN
INTRAMUSCULAR | Status: DC | PRN
  Administered 2015-11-05: 0.5 mg via INTRAVENOUS
  Administered 2015-11-05: 1.5 mg via INTRAVENOUS

## 2015-11-05 MED ORDER — ALBUTEROL SULFATE (2.5 MG/3ML) 0.083% IN NEBU: 3.0000 mL | INHALATION_SOLUTION | Freq: Four times a day (QID) | RESPIRATORY_TRACT | Status: DC | PRN

## 2015-11-05 MED ORDER — FENTANYL CITRATE (PF) 100 MCG/2ML IJ SOLN
INTRAMUSCULAR | Status: DC | PRN
  Administered 2015-11-05 (×2): 100 ug via INTRAVENOUS
  Administered 2015-11-05: 50 ug via INTRAVENOUS
  Administered 2015-11-05: 100 ug via INTRAVENOUS
  Administered 2015-11-05 (×2): 50 ug via INTRAVENOUS
  Administered 2015-11-05: 100 ug via INTRAVENOUS
  Administered 2015-11-05: 50 ug via INTRAVENOUS

## 2015-11-05 MED ORDER — DEXAMETHASONE SODIUM PHOSPHATE 10 MG/ML IJ SOLN
INTRAMUSCULAR | Status: DC | PRN
  Administered 2015-11-05: 10 mg via INTRAVENOUS

## 2015-11-05 MED ORDER — SIMETHICONE 80 MG PO CHEW
80.0000 mg | CHEWABLE_TABLET | Freq: Four times a day (QID) | ORAL | Status: DC | PRN
  Administered 2015-11-06: 80 mg via ORAL
  Filled 2015-11-05: qty 1

## 2015-11-05 MED ORDER — OXYCODONE HCL 5 MG PO TABS
5.0000 mg | ORAL_TABLET | ORAL | Status: DC | PRN
  Administered 2015-11-06 (×2): 5 mg via ORAL
  Filled 2015-11-05 (×2): qty 1

## 2015-11-05 MED ORDER — FAMOTIDINE 20 MG PO TABS
20.0000 mg | ORAL_TABLET | Freq: Two times a day (BID) | ORAL | Status: DC
  Administered 2015-11-05 – 2015-11-06 (×2): 20 mg via ORAL
  Filled 2015-11-05 (×2): qty 1

## 2015-11-05 MED ORDER — GLYCOPYRROLATE 0.2 MG/ML IJ SOLN
INTRAMUSCULAR | Status: AC
  Filled 2015-11-05: qty 3

## 2015-11-05 MED ORDER — SCOPOLAMINE 1 MG/3DAYS TD PT72
1.0000 | MEDICATED_PATCH | Freq: Once | TRANSDERMAL | Status: AC
  Administered 2015-11-05: 1.5 mg via TRANSDERMAL
  Administered 2015-11-05: 1 via TRANSDERMAL

## 2015-11-05 MED ORDER — ONDANSETRON HCL 4 MG/2ML IJ SOLN
INTRAMUSCULAR | Status: AC
  Filled 2015-11-05: qty 2

## 2015-11-05 MED ORDER — KETOROLAC TROMETHAMINE 30 MG/ML IJ SOLN
INTRAMUSCULAR | Status: DC | PRN
  Administered 2015-11-05: 30 mg via INTRAVENOUS

## 2015-11-05 MED ORDER — PROPOFOL 10 MG/ML IV BOLUS
INTRAVENOUS | Status: DC | PRN
  Administered 2015-11-05: 200 mg via INTRAVENOUS

## 2015-11-05 MED ORDER — KETOROLAC TROMETHAMINE 30 MG/ML IJ SOLN: 30.0000 mg | Freq: Four times a day (QID) | INTRAMUSCULAR | Status: DC

## 2015-11-05 MED ORDER — PROPOFOL 10 MG/ML IV BOLUS
INTRAVENOUS | Status: AC
  Filled 2015-11-05: qty 20

## 2015-11-05 MED ORDER — HYDROMORPHONE HCL 1 MG/ML IJ SOLN
INTRAMUSCULAR | Status: AC
  Filled 2015-11-05: qty 1

## 2015-11-05 MED ORDER — DEXAMETHASONE SODIUM PHOSPHATE 10 MG/ML IJ SOLN
INTRAMUSCULAR | Status: AC
  Filled 2015-11-05: qty 1

## 2015-11-05 MED ORDER — BUPIVACAINE HCL (PF) 0.25 % IJ SOLN
INTRAMUSCULAR | Status: DC | PRN
  Administered 2015-11-05: 17 mL

## 2015-11-05 MED ORDER — FENTANYL CITRATE (PF) 100 MCG/2ML IJ SOLN
INTRAMUSCULAR | Status: AC
  Filled 2015-11-05: qty 2

## 2015-11-05 SURGICAL SUPPLY — 53 items
APL SRG 38 LTWT LNG FL B (MISCELLANEOUS) ×3
APPLICATOR ARISTA FLEXITIP XL (MISCELLANEOUS) ×1 IMPLANT
BAG URINE DRAINAGE (UROLOGICAL SUPPLIES) ×1 IMPLANT
CABLE HIGH FREQUENCY MONO STRZ (ELECTRODE) IMPLANT
CATH FOLEY 3WAY  5CC 16FR (CATHETERS) ×1
CATH FOLEY 3WAY 5CC 16FR (CATHETERS) IMPLANT
CLOTH BEACON ORANGE TIMEOUT ST (SAFETY) ×4 IMPLANT
COVER BACK TABLE 60X90IN (DRAPES) ×3 IMPLANT
COVER LIGHT HANDLE  1/PK (MISCELLANEOUS) ×1
COVER LIGHT HANDLE 1/PK (MISCELLANEOUS) ×3 IMPLANT
COVER MAYO STAND STRL (DRAPES) ×1 IMPLANT
DEFOGGER SCOPE WARMER CLEARIFY (MISCELLANEOUS) ×1 IMPLANT
DILATOR CANAL MILEX (MISCELLANEOUS) ×1 IMPLANT
DURAPREP 26ML APPLICATOR (WOUND CARE) ×4 IMPLANT
GLOVE BIOGEL PI IND STRL 7.0 (GLOVE) ×15 IMPLANT
GLOVE BIOGEL PI INDICATOR 7.0 (GLOVE) ×5
GLOVE ECLIPSE 6.5 STRL STRAW (GLOVE) ×12 IMPLANT
GOWN STRL REUS W/TWL LRG LVL3 (GOWN DISPOSABLE) ×18 IMPLANT
HEMOSTAT ARISTA ABSORB 3G PWDR (MISCELLANEOUS) ×1 IMPLANT
LIGASURE VESSEL 5MM BLUNT TIP (ELECTROSURGICAL) ×4 IMPLANT
NEEDLE INSUFFLATION 120MM (ENDOMECHANICALS) ×4 IMPLANT
NS IRRIG 1000ML POUR BTL (IV SOLUTION) ×4 IMPLANT
OCCLUDER COLPOPNEUMO (BALLOONS) ×5 IMPLANT
PACK LAPAROSCOPY BASIN (CUSTOM PROCEDURE TRAY) ×4 IMPLANT
PACK TRENDGUARD 450 HYBRID PRO (MISCELLANEOUS) IMPLANT
PACK TRENDGUARD 600 HYBRD PROC (MISCELLANEOUS) IMPLANT
POUCH LAPAROSCOPIC INSTRUMENT (MISCELLANEOUS) ×4 IMPLANT
PROTECTOR NERVE ULNAR (MISCELLANEOUS) ×8 IMPLANT
SCISSORS LAP 5X35 DISP (ENDOMECHANICALS) ×4 IMPLANT
SET CYSTO W/LG BORE CLAMP LF (SET/KITS/TRAYS/PACK) ×2 IMPLANT
SET IRRIG TUBING LAPAROSCOPIC (IRRIGATION / IRRIGATOR) ×4 IMPLANT
SET TRI-LUMEN FLTR TB AIRSEAL (TUBING) ×4 IMPLANT
SHEARS HARMONIC ACE PLUS 36CM (ENDOMECHANICALS) ×1 IMPLANT
SLEEVE ADV FIXATION 5X100MM (TROCAR) ×4 IMPLANT
SOLUTION ELECTROLUBE (MISCELLANEOUS) ×4 IMPLANT
SUT VIC AB 0 CT1 27 (SUTURE) ×8
SUT VIC AB 0 CT1 27XBRD ANBCTR (SUTURE) ×6 IMPLANT
SUT VICRYL 0 UR6 27IN ABS (SUTURE) ×1 IMPLANT
SUT VICRYL 4-0 PS2 18IN ABS (SUTURE) ×4 IMPLANT
SUT VLOC 180 0 9IN  GS21 (SUTURE) ×1
SUT VLOC 180 0 9IN GS21 (SUTURE) ×3 IMPLANT
SYR 50ML LL SCALE MARK (SYRINGE) ×8 IMPLANT
TIP UTERINE 5.1X6CM LAV DISP (MISCELLANEOUS) IMPLANT
TIP UTERINE 6.7X10CM GRN DISP (MISCELLANEOUS) ×1 IMPLANT
TIP UTERINE 6.7X6CM WHT DISP (MISCELLANEOUS) IMPLANT
TIP UTERINE 6.7X8CM BLUE DISP (MISCELLANEOUS) IMPLANT
TOWEL OR 17X24 6PK STRL BLUE (TOWEL DISPOSABLE) ×8 IMPLANT
TRENDGUARD 450 HYBRID PRO PACK (MISCELLANEOUS)
TRENDGUARD 600 HYBRID PROC PK (MISCELLANEOUS) ×4
TROCAR ADV FIXATION 5X100MM (TROCAR) ×4 IMPLANT
TROCAR PORT AIRSEAL 8X120 (TROCAR) ×4 IMPLANT
TROCAR XCEL NON-BLD 5MMX100MML (ENDOMECHANICALS) ×4 IMPLANT
WARMER LAPAROSCOPE (MISCELLANEOUS) ×4 IMPLANT

## 2015-11-05 NOTE — Anesthesia Procedure Notes (Signed)
Procedure Name: Intubation Date/Time: 11/05/2015 10:35 AM Performed by: Nilda Simmer Pre-anesthesia Checklist: Patient identified, Patient being monitored, Timeout performed, Emergency Drugs available and Suction available Patient Re-evaluated:Patient Re-evaluated prior to inductionOxygen Delivery Method: Circle System Utilized and Circle system utilized Preoxygenation: Pre-oxygenation with 100% oxygen Intubation Type: IV induction Ventilation: Mask ventilation without difficulty and Oral airway inserted - appropriate to patient size Laryngoscope Size: Charlyne Petrin and 2 Grade View: Grade II Tube type: Oral Tube size: 7.0 mm Number of attempts: 1 Airway Equipment and Method: stylet Placement Confirmation: ETT inserted through vocal cords under direct vision,  positive ETCO2 and breath sounds checked- equal and bilateral Secured at: 22 cm Tube secured with: Tape Dental Injury: Teeth and Oropharynx as per pre-operative assessment

## 2015-11-05 NOTE — Progress Notes (Signed)
Day of Surgery Procedure(s) (LRB): HYSTERECTOMY TOTAL LAPAROSCOPIC (N/A) BILATERAL SALPINGECTOMY (Bilateral) CYSTOSCOPY (N/A)  Subjective: Patient reports no nausea.  She was having cramping and back pain but IV morphine, 2mg , has resolved this pain.  Has voided x 1.  Would like to eat.  Has walked to the bathroom and back.    Objective: I have reviewed patient's vital signs, intake and output, medications and labs.  General: alert and cooperative Resp: clear to auscultation bilaterally Cardio: regular rate and rhythm, S1, S2 normal, no murmur, click, rub or gallop GI: soft, non-tender; bowel sounds normal; no masses,  no organomegaly and incision: clean, dry and intact Extremities: extremities normal, atraumatic, no cyanosis or edema Vaginal Bleeding: minimal  Assessment: s/p Procedure(s): HYSTERECTOMY TOTAL LAPAROSCOPIC (N/A) BILATERAL SALPINGECTOMY (Bilateral) CYSTOSCOPY (N/A): stable  Plan: Advance diet Encourage ambulation Advance to PO medication  LOS: 1 day    Lyman Speller 11/05/2015, 6:38 PM

## 2015-11-05 NOTE — H&P (Signed)
Sally [name] is an 40 y.o. female G2 P2 MWF here for TLH/bilateral salpingectomy/possible BSO and cystoscopy due to chronic pelvic pain and DUB.  Pt has been treated for the irregular bleeding with POPs without success.  Also, since her last Cesarean section, she experiences pain with intercourse every time.  She did go to PT once but did not like how "invasive" the pelvic PT felt. Evaluation with ultrasound and endometrial biopsy was done 6/232/17Biopsy was negative for abnormal cells and a single fibroid was noted.  Due to these two issues, she and I have discussed laparoscopy for diagnosis of possible cause vs hysterectomy.  She is ready to proceed with definitive management.  She has undergone a BTL so is not desirous of future child bearing.  Risks and benefits have been discussed.  Pt is here and ready to proceed.  Pertinent Gynecological History: Menses: DUB Contraception: tubal ligation DES exposure: denies Blood transfusions: none Sexually transmitted diseases:  Previous GYN Procedures: laparoscopy in 2012 for ovarian cyst excision  Last mammogram: has not started MMG yet Date: n/a Last pap: normal Date: 3/17.  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 OB History: G2, P2   Menstrual History: Patient's last menstrual period was 10/26/2015 (exact date).    Past Medical History:  Diagnosis Date  . Acute appendicitis 09/09/2012  . Anxiety   . Asthma   . Back pain    Occ. issues wih back pain  . Back pain 09/09/2012   It is intermittent now,  previously source of fibromyalgia dx.    . Fibromyalgia   . Headache   . Hypothyroid 09/09/2012  . Hypothyroidism   . Osteoarthritis   . Precancerous changes of the cervix   . Sleep apnea    pre lap banding-never used cpap or mask  . Vitamin D deficiency     Past Surgical History:  Procedure Laterality Date  . APPENDECTOMY    . BRAIN SURGERY  1992   remove blood clot after a fall  . CESAREAN SECTION  1997, 2013  . GASTRIC BANDING  PORT REVISION  01/20/2012   Procedure: GASTRIC BANDING PORT REVISION;  Surgeon: Pedro Earls, MD;  Location: [facility];  Service: General;  Laterality: N/A;  . KNEE ARTHROSCOPY WITH DRILLING/MICROFRACTURE Right 12/06/2014   Procedure: KNEE ARTHROSCOPY WITH DRILLING/MICROFRACTURE, CHONDROPLASTY, EXCISION OF PLICA;  Surgeon: Dorna Leitz, MD;  Location: [facility];  Service: Orthopedics;  Laterality: Right;  . KNEE SURGERY    . LAPAROSCOPIC APPENDECTOMY N/A 09/09/2012   Procedure: APPENDECTOMY LAPAROSCOPIC;  Surgeon: Merrie Roof, MD;  Location: [facility];  Service: General;  Laterality: N/A;  . [place]  . LAPAROSCOPY  08/19/2010   Procedure: LAPAROSCOPY OPERATIVE;  Surgeon: Felipa Emory;  Location: [facility];  Service: Gynecology;  Laterality: N/A;  with Biopsy of uterine serosa  . TUBAL LIGATION     8/13    Family History  Problem Relation Age of Onset  . Hypertension Maternal Grandfather   . Diabetes Paternal Grandmother   . Anesthesia problems Neg Hx     Social History:  reports that she has been smoking Cigarettes.  She has a 20.00 pack-year smoking history. She has never used smokeless tobacco. She reports that she drinks alcohol. She reports that she does not use drugs.  Allergies:  Allergies  Allergen Reactions  . Paroxetine Hcl Rash  . Percocet [Oxycodone-Acetaminophen] Itching  . Codeine Other (See Comments)    Puts patient  to sleep "knocks her out" for at least two days, regardless of dosage.    Prescriptions Prior to Admission  Medication Sig Dispense Refill Last Dose  . albuterol (PROVENTIL HFA;VENTOLIN HFA) 108 (90 Base) MCG/ACT inhaler Inhale 1-2 puffs into the lungs every 6 (six) hours as needed for wheezing or shortness of breath.    11/05/2015 at unknown  . clonazePAM (KLONOPIN) 0.5 MG tablet Take 0.5 mg by mouth 2 (two) times daily as needed for anxiety.   11/04/2015 at unknown  . escitalopram (LEXAPRO) 10 MG tablet Take 1  tablet (10 mg total) by mouth daily. 90 tablet 1 10/22/2015 at unknown  . levothyroxine (SYNTHROID, LEVOTHROID) 150 MCG tablet Take 1 tablet (150 mcg total) by mouth 2 (two) times daily. 180 tablet 3 11/05/2015 at unknown  . loratadine (CLARITIN) 10 MG tablet Take 10 mg by mouth daily as needed for allergies.   Past Week at unknown  . meloxicam (MOBIC) 15 MG tablet Take 15 mg by mouth daily.    Past Week at unknown  . norethindrone (MICRONOR,CAMILA,ERRIN) 0.35 MG tablet Take 1 tablet (0.35 mg total) by mouth daily. 3 Package 2 11/04/2015 at unknown  . traMADol (ULTRAM) 50 MG tablet Take 1-2 tablets (50-100 mg total) by mouth every 6 (six) hours as needed for moderate pain. 40 tablet 0 Past Week at unknown  . Vitamin D, Ergocalciferol, (DRISDOL) 50000 units CAPS capsule Take 50,000 Units by mouth every Tuesday.   10/30/2015  . ibuprofen (ADVIL,MOTRIN) 800 MG tablet Take 1 tablet (800 mg total) by mouth every 8 (eight) hours as needed. 30 tablet 0 Unknown at Unknown time  . oxyCODONE (ROXICODONE) 5 MG immediate release tablet Take 1 tablet (5 mg total) by mouth every 4 (four) hours as needed for moderate pain or severe pain. 30 tablet 0 10/31/2015    Review of Systems  All other systems reviewed and are negative.   Blood pressure (!) 144/79, pulse 79, temperature 98 F (36.7 C), temperature source Oral, resp. rate 18, last menstrual period 10/26/2015, SpO2 98 %. Physical Exam  Constitutional: She is oriented to person, place, and time. She appears well-developed and well-nourished.  Cardiovascular: Normal rate and regular rhythm.   Respiratory: Effort normal and breath sounds normal.  Neurological: She is alert and oriented to person, place, and time.  Skin: Skin is warm and dry.  Psychiatric: She has a normal mood and affect.    Results for orders placed or performed during the hospital encounter of 11/05/15 (from the past 24 hour(s))  Pregnancy, urine     Status: None   Collection Time:  11/05/15  9:17 AM  Result Value Ref Range   Preg Test, Ur NEGATIVE NEGATIVE    No results found.  Assessment/Plan: 40 yo G2P2 MWF here for TLH/bilateral salpingectomy, possible BSO, Cystoscopy due to chronic pelvic pain and DUB.  Hale Bogus SUZANNE 11/05/2015, 9:57 AM

## 2015-11-05 NOTE — Addendum Note (Signed)
Addendum  created 11/05/15 2134 by Flossie Dibble, CRNA   Sign clinical note

## 2015-11-05 NOTE — Anesthesia Postprocedure Evaluation (Addendum)
Anesthesia Post Note  Patient: ANAYS DOWLER  Procedure(s) Performed: Procedure(s) (LRB): HYSTERECTOMY TOTAL LAPAROSCOPIC (N/A) BILATERAL SALPINGECTOMY (Bilateral) CYSTOSCOPY (N/A)  Patient location during evaluation: PACU Anesthesia Type: General Level of consciousness: awake and alert Pain management: pain level controlled Vital Signs Assessment: post-procedure vital signs reviewed and stable Respiratory status: spontaneous breathing, nonlabored ventilation and respiratory function stable Cardiovascular status: blood pressure returned to baseline and stable Postop Assessment: no signs of nausea or vomiting Anesthetic complications: no     Last Vitals:  Vitals:   11/05/15 1445 11/05/15 1504  BP: (!) 147/80 (!) 147/70  Pulse: 72 62  Resp: 13 18  Temp: 36.4 C 36.8 C    Last Pain:  Vitals:   11/05/15 1504  TempSrc:   PainSc: 4    Pain Goal: Patients Stated Pain Goal: 3 (11/05/15 1504)               Nilda Simmer

## 2015-11-05 NOTE — Anesthesia Preprocedure Evaluation (Signed)
Anesthesia Evaluation  Patient identified by MRN, date of birth, ID band Patient awake    Reviewed: Allergy & Precautions, NPO status , Patient's Chart, lab work & pertinent test results  Airway Mallampati: II  TM Distance: >3 FB Neck ROM: Full    Dental  (+) Teeth Intact, Dental Advisory Given   Pulmonary asthma , Sleep apnea: Pt never was prescribed any CPAP device as it was not needed. , Current Smoker,    breath sounds clear to auscultation       Cardiovascular  Rhythm:Regular Rate:Normal     Neuro/Psych  Headaches, PSYCHIATRIC DISORDERS Anxiety    GI/Hepatic   Endo/Other  Hypothyroidism   Renal/GU      Musculoskeletal  (+) Arthritis ,   Abdominal   Peds  Hematology   Anesthesia Other Findings   Reproductive/Obstetrics                             Anesthesia Physical  Anesthesia Plan  ASA: III  Anesthesia Plan: General   Post-op Pain Management:    Induction: Intravenous  Airway Management Planned: Oral ETT  Additional Equipment:   Intra-op Plan:   Post-operative Plan: Extubation in OR  Informed Consent: I have reviewed the patients History and Physical, chart, labs and discussed the procedure including the risks, benefits and alternatives for the proposed anesthesia with the patient or authorized representative who has indicated his/her understanding and acceptance.   Dental advisory given  Plan Discussed with: CRNA, Anesthesiologist and Surgeon  Anesthesia Plan Comments:         Anesthesia Quick Evaluation

## 2015-11-05 NOTE — Op Note (Signed)
11/05/2015  1:15 PM  PATIENT:  Sally [name]  40 y.o. female  PRE-OPERATIVE DIAGNOSIS:  DYSFUNCTIONAL UTERINE BLEEDING,PELVIC PAIN,DYSPAREUNIA  POST-OPERATIVE DIAGNOSIS:  DYSFUNCTIONAL UTERINE BLEEDING,PELVIC PAIN,DYSPAREUNIA  PROCEDURE:  Procedure(s): HYSTERECTOMY TOTAL LAPAROSCOPIC BILATERAL SALPINGECTOMY CYSTOSCOPY  SURGEON:  Nuvia Hileman SUZANNE  ASSISTANTS: JILL JERTSON, MD   ANESTHESIA:   general  ESTIMATED BLOOD LOSS: 100cc  BLOOD ADMINISTERED:none   FLUIDS: 1800ccLR  UOP: 200 cc clear UOP  SPECIMEN:  Uterus, cervix, bilateral fallopian tubes  DISPOSITION OF SPECIMEN:  PATHOLOGY  FINDINGS: adhesions along lower uterine segment due to prior cesarean sections especially in the LLQ where these were more dense and more significant, s/p BTL, h/o lap band placement that is still present  DESCRIPTION OF OPERATION: Patient is taken to the operating room. She is placed in the supine position. She is a running IV in place. Informed consent was present on the chart. SCDs on her lower extremities and functioning properly.  Pt was positioned awake due to knee and ankle issues.  General endotracheal anesthesia was administered by the anesthesia staff without difficulty. Once adequate anesthesia was confirmed the legs are placed in the low lithotomy position in [place]. Her arms were tucked by the side using positioning symptoms.   Dura prep was then used to prep the abdomen and Betadine was used to prep the inner thighs, perineum and vagina. Once 3 minutes had past the patient was draped in a normal standard fashion. The legs were lifted to the high lithotomy position. The cervix was visualized by placing a heavy weighted speculum in the posterior aspect of the vagina and using a curved Deaver retractor to the retract anteriorly. The anterior lip of the cervix was grasped with single-tooth tenaculum.  The cervix sounded to 10 cm. Pratt dilators were used to dilate the cervix  up to a #21. A RUMI uterine manipulator was obtained. A #10 disposable tip was placed on the RUMI manipulator as well as a small, silver KOH ring. This was passed through the cervix and the bulb of the disposable tip was inflated with 10 cc of normal saline. There was a good fit of the KOH ring around the cervix. The tenaculum was removed. There is also good manipulation of the uterus. The speculum and retractor were removed as well. A Foley catheter was placed to straight drain. The concentrated urine was noted. Legs were lowered to the low lithotomy position and attention was turned the abdomen.  The umbilicus was everted.  A Veress needle was obtained. Syringe of sterile saline was placed on a open Veress needle.  This was passed into the umbilicus until just when the fluid started to drip.  Then low flow CO2 gas was attached the needle and the pneumoperitoneum was achieved without difficulty. Once 3.4 liters of gas was in the abdomen the Veress needle was removed and a 5 millimeter non-bladed Optiview trocar and port were passed directly to the abdomen. The laparoscope was then used to confirm intraperitoneal placement. Left subserosal fibroid was seen as well as significant adhesions where prior cesarean sections were noted.  There were more dense and thicker adhesions in the LLQ.   Ovaries were normal.   Prior BTL with prior transection in tubes noted.  Locations for RLQ and LLQ ports were noted by transillumination of the abdominal wall.  0.25% marcaine was used to anesthetize the skin.  60mm skin incisions were made and then 56mm bladed ports were placed.  Finally a midline incision was made about  4 cm above the pubic symphysis.  The skin was incised about 1cm and a non-bladed 8 port was placed with direct visualization of the laparoscope.    Ureters were identifies.  Attention was turned to the left side. With uterus on stretch the left tube was excised off the ovary and mesosalpinx was dissected to free  the tube. Then the left utero-ovarian pedicle was serially clamped cauterized and incised using the ligasure device. Left round ligament was serially clamped cauterized and incised. The posterior peritoneum of the inferior leaf of the broad ligament was opened. The beginning of the baldde flap was started but due to dense adhesions, this was stopped and dissection continued on the right side.    Attention was turned the right side.  The uterus was placed on stretch to the opposite side.  The tube was excised off the ovary using sharp dissection a bipolar cautery.  The mesosalpinx was incised freeing the tube. Then the right uterine ovarian pedicle was serially clamped cauterized and incised. Next the right round ligament was serially clamped cauterized and incised. The posterior peritoneum of the inferiorly for the broad ligament was opened.  The beginning of the bladder flap dissection was started on this side but dense adhesions were noted as well.  The bladder was instilled with saline to better identity the bladder edge.  Then keeping close to the uterus and in the midline, incision was made.  The white glissening tissues of the cervix was identified.  Then using sharp dissection, the thick and scarred tissue from prior cesarean sections was taken down.  At this point the bladder flap was well defined and below the level of the KOH ring on both sides.  Foley was placed to SD to drain the bladder.    The right uterine artery was then skeletonized.  Next, the right uterine artery was clamped, cut and incised.  Then the left uterine artery skeletonized and then just superior to the KOH ring this vessel was serially clamped, cauterized, and incised.  The uterus was devascularized at this point.  The colpotomy was performed a starting in the midline and using a harmonic scalpel with the inferior edge of the open blade  This was carried around a circumferential fashion until the vaginal mucosa was completely  incised in the specimen was freed.  The specimen was then delivered to the vagina.  A vaginal occlusive device was used to maintain the pneumoperitoneum  Instruments were changed with a needle driver and million dollar graspers.  Using a 9 inch V. lock suture, the cuff was closed by incorporating the anterior and posterior vaginal mucosa in each stitch. This was carried across all the way to the left corner and a running fashion. Two stitches were brought back towards the midline and the suture was cut flush with the vagina. The needle was brought out the pelvis. The pelvis was irrigated. All pedicles were inspected. No bleeding was noted.  Arista was placed in the pelvis.  There was no bleeding but extensive dissection at the bladder.  I  At this point the procedure was completed.  Pneumoperitoneum was relieved.  Ports were removed.  The patient was taken out of Trendelenburg positioning.  Several deep breaths were given to the patient's trying to any gas the abdomen and finally the midline port was removed.  The skin was then closed with subcuticular stitches of 3-0 Vicryl. The skin was cleansed Dermabond was applied. Attention was then turned the vagina and the cuff  was inspected. No bleeding was noted. The anterior posterior vaginal mucosa was incorporated in each stitch. The Foley catheter was removed.  Cystoscopy was performed.  No sutures or bladder injuries were noted.  Ureters were noted with normal urine jets from each one was seen.  Foley was left out.  The cystoscopic fluid was drained when cystoscopy was performed.  Sponge, lap, needle, initially counts were correct x2. Patient tolerated the procedure very well. She was awakened from anesthesia, extubated and taken to recovery in stable condition.   COUNTS:  YES  PLAN OF CARE: Transfer to PACU

## 2015-11-05 NOTE — Transfer of Care (Signed)
Immediate Anesthesia Transfer of Care Note  Patient: Sally [name]  Procedure(s) Performed: Procedure(s): HYSTERECTOMY TOTAL LAPAROSCOPIC (N/A) BILATERAL SALPINGECTOMY (Bilateral) CYSTOSCOPY (N/A)  Patient Location: PACU  Anesthesia Type:General  Level of Consciousness: awake, alert  and oriented  Airway & Oxygen Therapy: Patient Spontanous Breathing and Patient connected to face mask oxygen  Post-op Assessment: Report given to RN, Post -op Vital signs reviewed and stable and Patient moving all extremities X 4  Post vital signs: Reviewed and stable  Last Vitals:  Vitals:   11/05/15 0931  BP: (!) 144/79  Pulse: 79  Resp: 18  Temp: 36.7 C    Last Pain:  Vitals:   11/05/15 0931  TempSrc: Oral      Patients Stated Pain Goal: 3 ([id number])  Complications: No apparent anesthesia complications

## 2015-11-05 NOTE — Progress Notes (Signed)
The patient is receiving Famotidine by the intravenous route.  Based on criteria approved by the Pharmacy and [place], the medication is being converted to the equivalent oral dose form.  These criteria include: -No Active GI bleeding -Able to tolerate diet of full liquids (or better) or tube feeding -Able to tolerate other medications by the oral or enteral route  If you have any questions about this conversion, please contact the Pharmacy Department (ext [phone]).  Thank you.

## 2015-11-05 NOTE — Anesthesia Postprocedure Evaluation (Signed)
Anesthesia Post Note  Patient: Sally [name]  Procedure(s) Performed: Procedure(s) (LRB): HYSTERECTOMY TOTAL LAPAROSCOPIC (N/A) BILATERAL SALPINGECTOMY (Bilateral) CYSTOSCOPY (N/A)  Patient location during evaluation: [facility] Anesthesia Type: General Level of consciousness: awake and alert Pain management: satisfactory to patient Vital Signs Assessment: post-procedure vital signs reviewed and stable Respiratory status: spontaneous breathing and respiratory function stable Cardiovascular status: stable Postop Assessment: adequate PO intake Anesthetic complications: no     Last Vitals:  Vitals:   11/05/15 1504 11/05/15 2128  BP: (!) 147/70 (!) 143/57  Pulse: 62 63  Resp: 18 18  Temp: 36.8 C 37 C    Last Pain:  Vitals:   11/05/15 2128  TempSrc: Oral  PainSc:    Pain Goal: Patients Stated Pain Goal: 3 (11/05/15 2050)               Katherina Mires

## 2015-11-06 ENCOUNTER — Encounter (HOSPITAL_COMMUNITY): Payer: Self-pay | Admitting: Obstetrics & Gynecology

## 2015-11-06 DIAGNOSIS — N938 Other specified abnormal uterine and vaginal bleeding: Secondary | ICD-10-CM | POA: Diagnosis not present

## 2015-11-06 LAB — CBC
HEMATOCRIT: 39.5 % (ref 36.0–46.0)
Hemoglobin: 13.5 g/dL (ref 12.0–15.0)
MCH: 32.6 pg (ref 26.0–34.0)
MCHC: 34.2 g/dL (ref 30.0–36.0)
MCV: 95.4 fL (ref 78.0–100.0)
Platelets: 228 10*3/uL (ref 150–400)
RBC: 4.14 MIL/uL (ref 3.87–5.11)
RDW: 13.2 % (ref 11.5–15.5)
WBC: 13.4 10*3/uL — AB (ref 4.0–10.5)

## 2015-11-06 MED ORDER — LORATADINE 10 MG PO TABS
10.0000 mg | ORAL_TABLET | Freq: Every day | ORAL | Status: DC
  Administered 2015-11-06: 10 mg via ORAL
  Filled 2015-11-06: qty 1

## 2015-11-06 NOTE — Discharge Instructions (Signed)

## 2015-11-06 NOTE — Discharge Summary (Signed)
Physician Discharge Summary  Patient ID: Sally [name] MRN: [id number] DOB/AGE: May 11, 1975 40 y.o.  Admit date: 11/05/2015 Discharge date: 11/06/2015  Admission Diagnoses: pelvic pain, dyspareunia, subserosal fibroid, DUB  Discharge Diagnoses:  Active Problems:   Pelvic pain in female   Discharged Condition: good  Hospital Course: Patient admitted through same day surgery.  She was taken to OR where TLH/bilateral salpingectomy/cystoscopy were performed.  Surgical findings included pelvic adhesions and bladder adhesions from prior cesarean sections.  Surgery was uneventful.  EBL 100cc.  Foley catheter was removed before leaving OR.  Patient transferred to PACU where she was stable and then to 3rd floor for the remainder of her hospitalization.  During her post-op recovery, her vitals and stable and she was AF.  In evening of POD#0, she was able to transition to oral pain medications and regular diet.  She was able to ambulate and she had good pain control.  She was also able to void on her own.  Patient seen both in the evening of POD#0 and AM of POD#1.  In the AM of POD#1, she was without complaint.  Post op hb was 13.4, decreased from 14.9, pre-operatively.  At this point, patient was voiding, walking, having excellent pain control, had no nausea, and minimal vaginal bleeding.  She was ready for D/C.   Consults: None  Significant Diagnostic Studies: labs: post op hb 13.4  Treatments: surgery: TLH/bilateral salpingectomy/cystoscopy/LOA  Discharge Exam: Blood pressure 128/73, pulse (!) 58, temperature 98.5 F (36.9 C), temperature source Oral, resp. rate 18, height 5\' 7"  (1.702 m), weight 265 lb (120.2 kg), last menstrual period 10/26/2015, SpO2 98 %. General appearance: alert and cooperative Resp: clear to auscultation bilaterally Cardio: regular rate and rhythm, S1, S2 normal, no murmur, click, rub or gallop GI: soft, non-tender; bowel sounds normal; no masses,  no  organomegaly Extremities: extremities normal, atraumatic, no cyanosis or edema Incision/Wound:c/d/i  Disposition: 01-Home or Self Care     Medication List    TAKE these medications   albuterol 108 (90 Base) MCG/ACT inhaler Commonly known as:  PROVENTIL HFA;VENTOLIN HFA Inhale 1-2 puffs into the lungs every 6 (six) hours as needed for wheezing or shortness of breath. What changed:  Another medication with the same name was added. Make sure you understand how and when to take each.   PROAIR RESPICLICK 123XX123 (90 Base) MCG/ACT Aepb Generic drug:  Albuterol Sulfate TAKE 2 PUFFS EVERY 6 HOURS What changed:  You were already taking a medication with the same name, and this prescription was added. Make sure you understand how and when to take each.   clonazePAM 0.5 MG tablet Commonly known as:  KLONOPIN Take 0.5 mg by mouth 2 (two) times daily as needed for anxiety.   escitalopram 10 MG tablet Commonly known as:  LEXAPRO Take 1 tablet (10 mg total) by mouth daily.   ibuprofen 800 MG tablet Commonly known as:  ADVIL,MOTRIN Take 1 tablet (800 mg total) by mouth every 8 (eight) hours as needed.   levothyroxine 150 MCG tablet Commonly known as:  SYNTHROID, LEVOTHROID Take 1 tablet (150 mcg total) by mouth 2 (two) times daily.   loratadine 10 MG tablet Commonly known as:  CLARITIN Take 10 mg by mouth daily as needed for allergies.   meloxicam 15 MG tablet Commonly known as:  MOBIC Take 15 mg by mouth daily.   norethindrone 0.35 MG tablet Commonly known as:  MICRONOR,CAMILA,ERRIN Take 1 tablet (0.35 mg total) by mouth daily.   oxyCODONE  5 MG immediate release tablet Commonly known as:  ROXICODONE Take 1 tablet (5 mg total) by mouth every 4 (four) hours as needed for moderate pain or severe pain.   traMADol 50 MG tablet Commonly known as:  ULTRAM Take 1-2 tablets (50-100 mg total) by mouth every 6 (six) hours as needed for moderate pain.   Vitamin D (Ergocalciferol) 50000  units Caps capsule Commonly known as:  DRISDOL Take 50,000 Units by mouth every Tuesday.        SignedLyman Speller 11/06/2015, 9:25 AM

## 2015-11-06 NOTE — Progress Notes (Signed)
1 Day Post-Op Procedure(s) (LRB): HYSTERECTOMY TOTAL LAPAROSCOPIC (N/A) BILATERAL SALPINGECTOMY (Bilateral) CYSTOSCOPY (N/A)  Subjective: Patient reports good pain control.  She is having some gas pain.  No nausea.  Eating regular diet.  Walking without difficulty.  Voiding without difficulty.      Objective: I have reviewed patient's vital signs, intake and output, medications and labs.  General: alert and cooperative Resp: clear to auscultation bilaterally Cardio: regular rate and rhythm, S1, S2 normal, no murmur, click, rub or gallop GI: soft, non-tender; bowel sounds normal; no masses,  no organomegaly and incision: clean, dry and intact Extremities: extremities normal, atraumatic, no cyanosis or edema Vaginal Bleeding: minimal  Assessment: s/p Procedure(s): HYSTERECTOMY TOTAL LAPAROSCOPIC (N/A) BILATERAL SALPINGECTOMY (Bilateral) CYSTOSCOPY (N/A): progressing well  Plan: Discharge home  LOS: 1 day    Hale Bogus SUZANNE 11/06/2015, 7:33 AM

## 2015-11-06 NOTE — Progress Notes (Signed)
Pt discharged to home with husband.  Condition stable.  Pt ambulated to car with Astrid Divine, NT.  No equipment for home ordered at discharge.

## 2015-11-13 ENCOUNTER — Encounter: Payer: Self-pay | Admitting: Obstetrics & Gynecology

## 2015-11-13 ENCOUNTER — Ambulatory Visit (INDEPENDENT_AMBULATORY_CARE_PROVIDER_SITE_OTHER): Payer: BC Managed Care – PPO | Admitting: Obstetrics & Gynecology

## 2015-11-13 VITALS — BP 116/78 | HR 78 | Resp 16 | Ht 67.5 in | Wt 261.0 lb

## 2015-11-13 DIAGNOSIS — K219 Gastro-esophageal reflux disease without esophagitis: Secondary | ICD-10-CM

## 2015-11-13 DIAGNOSIS — Z0189 Encounter for other specified special examinations: Secondary | ICD-10-CM

## 2015-11-13 DIAGNOSIS — M545 Low back pain, unspecified: Secondary | ICD-10-CM

## 2015-11-13 DIAGNOSIS — R110 Nausea: Secondary | ICD-10-CM

## 2015-11-13 LAB — POCT URINALYSIS DIPSTICK
BILIRUBIN UA: NEGATIVE
GLUCOSE UA: NEGATIVE
KETONES UA: NEGATIVE
Leukocytes, UA: NEGATIVE
NITRITE UA: NEGATIVE
Protein, UA: NEGATIVE
RBC UA: NEGATIVE
Urobilinogen, UA: NEGATIVE
pH, UA: 5

## 2015-11-13 MED ORDER — TRIAMCINOLONE ACETONIDE 0.5 % EX OINT: 1.0000 "application " | TOPICAL_OINTMENT | Freq: Two times a day (BID) | CUTANEOUS | 0 refills | Status: DC

## 2015-11-13 MED ORDER — RANITIDINE HCL 150 MG PO TABS: 150.0000 mg | ORAL_TABLET | Freq: Two times a day (BID) | ORAL | 0 refills | Status: DC

## 2015-11-13 NOTE — Progress Notes (Signed)
Procedure: HYSTERECTOMY TOTAL LAPAROSCOPIC, BILATERAL SALPINGECTOMY, CYSTOSCOPY Days Post-op: 8 days   Subjective: Reports her biggest complaint has been nausea.  She is also reporting some upper abdominal pain.  Reports appetite has been decreased as well.  No emesis.    Denies vaginal bleeding.  Reports she is having some vulvar itching.    Voiding without difficulty.  No fevers.  Had some constipation.  Taking dulcolax with success so this is better.  Has taken about three Oxycodone today.  Reports pain is improving daily but if upper abdomen symptoms would go away, she's not sure she would need anything for pain.  Objective: BP 116/78 (BP Location: Right Arm, Patient Position: Sitting, Cuff Size: Large)   Pulse 78   Resp 16   Ht 5' 7.5" (1.715 m)   Wt 261 lb (118.4 kg)   LMP 10/26/2015 (Exact Date)   BMI 40.28 kg/m   EXAM General: alert and cooperative Resp: clear to auscultation bilaterally Cardio: regular rate and rhythm GI: soft, non-tender; bowel sounds normal; no masses,  no organomegaly and incision: clean, dry and intact Extremities: extremities normal, atraumatic, no cyanosis or edema Vaginal Bleeding: none  Gyn:  NAEFG with some inner labial erythema, vaginal without lesions and no bleeding, cuff intact without fullness or tenderness  Assessment: s/p TLH/bilateral salpingectomy/cystoscopy Possible reflux symptoms H/O lap band Low back pain  Plan: Start Zantac 150mg  bid.  #60/0RF.  Pt will give update next week. Urine culture is pending. Triamcinolone 0.5% BID for up to 7 days.

## 2015-11-15 LAB — URINE CULTURE

## 2015-11-16 MED ORDER — AMOXICILLIN 250 MG PO CAPS: 500.0000 mg | ORAL_CAPSULE | Freq: Three times a day (TID) | ORAL | 0 refills | Status: DC

## 2015-11-16 NOTE — Addendum Note (Signed)
Addended by: [name] on: 11/16/2015 05:44 PM   Modules accepted: Orders

## 2015-11-29 ENCOUNTER — Other Ambulatory Visit: Payer: Self-pay | Admitting: Emergency Medicine

## 2015-11-29 ENCOUNTER — Telehealth: Payer: Self-pay | Admitting: Obstetrics & Gynecology

## 2015-11-29 DIAGNOSIS — E038 Other specified hypothyroidism: Secondary | ICD-10-CM

## 2015-11-29 NOTE — Telephone Encounter (Signed)
Patient called and said, "I recently had a hysterectomy and got a prescription some antibiotics for my kidneys afterward. I think I need some more antibiotics that are stronger because my kidneys are killing me again. Can Dr. Sabra Heck please call something in for me?"  Patient declined an appointment today. Pharmacy on file is correct, if needed.

## 2015-11-29 NOTE — Telephone Encounter (Signed)
Spoke with patient. Patient was seen in the office on 11/13/2015 with Dr.Miller. Patient had [place] on 11/05/2015. Urine culture on 11/13/2015 returned positive for Group B Strep. Patient completed full course of Amoxicillin a week and a half ago. Two days ago the patient began to have lower back pain and nausea. "This is what happened the first time." Denies any urinary symptoms, fever, or chills. Reports that she had vomiting yesterday due to nausea. Denies any vomiting today. Patient declines an appointment at this time. States she was told to contact Dr.Miller if symptoms did not fully resolve. Asking about IV antibiotics. Advised I will speak with Dr.Miller regarding recommendations and return call. Patient is agreeable and verbalizes understanding.

## 2015-11-29 NOTE — Telephone Encounter (Signed)
Left message to call Rosi Secrist at [phone]. 

## 2015-11-29 NOTE — Telephone Encounter (Signed)
She needs a repeat urine culture.  IV antibiotics are not indicated at this time.  Order has not been placed.

## 2015-11-29 NOTE — Telephone Encounter (Signed)
Spoke with patient. Advised of message as seen below from [place]. Patient is agreeable. Nurse visit for urine culture scheduled for tomorrow 11/30/2015 at 3:30 pm. Patient is agreeable to date and time. Advised if she develops any new symptoms or symptoms worsen she will need to be seen earlier for evaluation with our office or local ER. Patient verbalizes understanding.  Routing to provider for final review. Patient agreeable to disposition. Will close encounter.

## 2015-11-30 ENCOUNTER — Ambulatory Visit (INDEPENDENT_AMBULATORY_CARE_PROVIDER_SITE_OTHER): Payer: BC Managed Care – PPO | Admitting: Obstetrics & Gynecology

## 2015-11-30 ENCOUNTER — Other Ambulatory Visit: Payer: Self-pay | Admitting: Obstetrics & Gynecology

## 2015-11-30 ENCOUNTER — Ambulatory Visit: Payer: BC Managed Care – PPO

## 2015-11-30 VITALS — BP 110/66 | HR 88 | Temp 97.5°F | Resp 16 | Ht 67.5 in | Wt 263.0 lb

## 2015-11-30 DIAGNOSIS — R350 Frequency of micturition: Secondary | ICD-10-CM

## 2015-11-30 DIAGNOSIS — R319 Hematuria, unspecified: Secondary | ICD-10-CM | POA: Diagnosis not present

## 2015-11-30 DIAGNOSIS — N390 Urinary tract infection, site not specified: Secondary | ICD-10-CM | POA: Diagnosis not present

## 2015-11-30 LAB — POCT URINALYSIS DIPSTICK
BILIRUBIN UA: NEGATIVE
Glucose, UA: NEGATIVE
Ketones, UA: NEGATIVE
NITRITE UA: NEGATIVE
PH UA: 5
PROTEIN UA: NEGATIVE
Urobilinogen, UA: NEGATIVE

## 2015-11-30 MED ORDER — AMOXICILLIN 500 MG PO CAPS: 500.0000 mg | ORAL_CAPSULE | Freq: Three times a day (TID) | ORAL | 0 refills | Status: DC

## 2015-11-30 NOTE — Progress Notes (Signed)
GYNECOLOGY  VISIT   HPI: 40 y.o. [id number] Married Caucasian female with complaint of increased back pain and mild dysuria.  This feels just like UTI that was treated post operatively and showed GBS.  Pt is asking if IV antibiotics is appropriate at this point.  Advised it is not appropriate, particularly since I have not even confirmed she has a second UTI at this point.  Denies hematuria, urgency or frequency.    TLH was performed 11/05/15.  Denies vaginal bleeding.    GYNECOLOGIC HISTORY: Patient's last menstrual period was 10/26/2015 (exact date).  Patient Active Problem List   Diagnosis Date Noted  . Smoker 06/16/2015  . Anxiety and depression 04/16/2015  . Chest pain 04/16/2015  . Fecal incontinence 10/09/2013  . Dyspareunia 10/09/2013  . Stress incontinence 09/24/2013  . Hypothyroid 09/09/2012  . Back pain 09/09/2012  . 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 Pelvic pain in female 08/19/2010    Past Medical History:  Diagnosis Date  . Acute appendicitis 09/09/2012  . Anxiety   . Asthma   . Back pain    Occ. issues wih back pain  . Back pain 09/09/2012   It is intermittent now,  previously source of fibromyalgia dx.    . Fibromyalgia   . Headache   . Hypothyroid 09/09/2012  . Hypothyroidism   . Osteoarthritis   . Precancerous changes of the cervix   . Sleep apnea    pre lap banding-never used cpap or mask  . Vitamin D deficiency     Past Surgical History:  Procedure Laterality Date  . APPENDECTOMY    . BILATERAL SALPINGECTOMY Bilateral 11/05/2015   Procedure: BILATERAL SALPINGECTOMY;  Surgeon: [name], MD;  Location: [facility];  Service: Gynecology;  Laterality: Bilateral;  . BRAIN SURGERY  1992   remove blood clot after a fall  . CESAREAN SECTION  1997, 2013  . CYSTOSCOPY N/A 11/05/2015   Procedure: CYSTOSCOPY;  Surgeon: [name], MD;  Location: [facility];  Service: Gynecology;  Laterality: N/A;  . GASTRIC BANDING PORT REVISION  01/20/2012   Procedure:  GASTRIC BANDING PORT REVISION;  Surgeon: Pedro Earls, MD;  Location: [facility];  Service: General;  Laterality: N/A;  . KNEE ARTHROSCOPY WITH DRILLING/MICROFRACTURE Right 12/06/2014   Procedure: KNEE ARTHROSCOPY WITH DRILLING/MICROFRACTURE, CHONDROPLASTY, EXCISION OF PLICA;  Surgeon: Dorna Leitz, MD;  Location: [facility];  Service: Orthopedics;  Laterality: Right;  . KNEE SURGERY    . LAPAROSCOPIC APPENDECTOMY N/A 09/09/2012   Procedure: APPENDECTOMY LAPAROSCOPIC;  Surgeon: Merrie Roof, MD;  Location: [facility];  Service: General;  Laterality: N/A;  . [place]  . LAPAROSCOPIC HYSTERECTOMY N/A 11/05/2015   Procedure: HYSTERECTOMY TOTAL LAPAROSCOPIC;  Surgeon: [name], MD;  Location: [facility];  Service: Gynecology;  Laterality: N/A;  . LAPAROSCOPY  08/19/2010   Procedure: LAPAROSCOPY OPERATIVE;  Surgeon: Felipa Emory;  Location: [facility];  Service: Gynecology;  Laterality: N/A;  with Biopsy of uterine serosa  . TUBAL LIGATION     8/13    MEDS:  Reviewed in EPIC and UTD  ALLERGIES: Paroxetine hcl; Percocet [oxycodone-acetaminophen]; and Codeine  Family History  Problem Relation Age of Onset  . Hypertension Maternal Grandfather   . Diabetes Paternal Grandmother   . Anesthesia problems Neg Hx     SH:  Smoker, married  Review of Systems  Genitourinary: Positive for frequency. Negative for flank pain.  All other systems reviewed and are negative.  PHYSICAL EXAMINATION:    BP 110/66 (BP Location: Right Arm, Patient Position: Sitting, Cuff Size: Large)   Pulse 88   Temp 97.5 F (36.4 C) (Oral)   Resp 16   Ht 5' 7.5" (1.715 m)   Wt 263 lb (119.3 kg)   LMP 10/26/2015 (Exact Date)   BMI 40.58 kg/m     General appearance: alert, cooperative and appears stated age Flank:  No cva tenderness  Assessment: Possible UTI with mildly increased urinary frequency  Plan: Urine culture pending.  Restarting Amoxicillin 514m tid x 7 days.  Pt  knows I may end up telling her to stop this.  If recurrent GBS UTI noted, may need urology consultation as well.   Nursing note: Patient in today for repeat urine culture. Patient states she still having the back pain.   UA: WBC=Small, RBC=Trace.

## 2015-12-01 LAB — URINALYSIS, MICROSCOPIC ONLY
BACTERIA UA: NONE SEEN [HPF]
CASTS: NONE SEEN [LPF]
CRYSTALS: NONE SEEN [HPF]
Yeast: NONE SEEN [HPF]

## 2015-12-02 LAB — URINE CULTURE

## 2015-12-03 ENCOUNTER — Encounter: Payer: Self-pay | Admitting: Obstetrics & Gynecology

## 2015-12-07 ENCOUNTER — Encounter: Payer: Self-pay | Admitting: Obstetrics & Gynecology

## 2015-12-07 ENCOUNTER — Ambulatory Visit (INDEPENDENT_AMBULATORY_CARE_PROVIDER_SITE_OTHER): Payer: BC Managed Care – PPO | Admitting: Obstetrics & Gynecology

## 2015-12-07 VITALS — BP 110/76 | HR 82 | Resp 16 | Ht 67.5 in | Wt 265.8 lb

## 2015-12-07 DIAGNOSIS — Z9889 Other specified postprocedural states: Secondary | ICD-10-CM

## 2015-12-07 NOTE — Progress Notes (Signed)
Post Operative Visit  Procedure:HYSTERECTOMY TOTAL LAPAROSCOPIC; BILATERAL SALPINGECTOMY; CYSTOSCOPY   Days Post-op:  1 month  Subjective: Doing well.  No vaginal bleeding.  Urinary function feels "normal" now.  Denies constipation.  Energy has gotten better.  Objective: BP 110/76 (BP Location: Right Arm, Patient Position: Sitting, Cuff Size: Large)   Pulse 82   Resp 16   Ht 5' 7.5" (1.715 m)   Wt 265 lb 12.8 oz (120.6 kg)   LMP 10/26/2015 (Exact Date)   BMI 41.02 kg/m   EXAM General: alert and cooperative Resp: clear to auscultation bilaterally Cardio: regular rate and rhythm, S1, S2 normal, no murmur, click, rub or gallop GI: soft, non-tender; bowel sounds normal; no masses,  no organomegaly and incision: clean, dry and intact Extremities: extremities normal, atraumatic, no cyanosis or edema Vaginal Bleeding: none  Gyn:  NAEFG, no bleeding or discharge, cuff healing well, no masses or fullness at cuff  Assessment: s/p TLH/bilateral salpingectomy/cystoscopy.  Doing well.  Plan: Pt released to return to normal activity except sexual activity for 8 full weeks post op. Pt knows to call with any new changes or problems.

## 2015-12-10 ENCOUNTER — Other Ambulatory Visit: Payer: Self-pay | Admitting: Obstetrics & Gynecology

## 2015-12-10 NOTE — Telephone Encounter (Signed)
Medication refill request: Zantac  Last AEX:  04-03-15  Next AEX: 07-15-16  Last MMG (if hormonal medication request): none on file  Refill authorized: please advise

## 2015-12-12 NOTE — Telephone Encounter (Signed)
Spoke with patient and she did not request this RX .She is not taking this at this time -eh

## 2015-12-12 NOTE — Telephone Encounter (Signed)
RF for Zantac came from pharmacy.  Pt was on this for a trial after her hysterectomy for nausea.  Has that resolved?  If so, ok to stop the Zantac.  Please confirm?  Thanks.

## 2016-01-07 ENCOUNTER — Other Ambulatory Visit: Payer: Self-pay

## 2016-01-07 ENCOUNTER — Ambulatory Visit (INDEPENDENT_AMBULATORY_CARE_PROVIDER_SITE_OTHER): Payer: BC Managed Care – PPO | Admitting: Family Medicine

## 2016-01-07 ENCOUNTER — Encounter: Payer: Self-pay | Admitting: Family Medicine

## 2016-01-07 VITALS — BP 112/80 | HR 76 | Temp 98.5°F | Resp 16 | Ht 68.0 in | Wt 266.1 lb

## 2016-01-07 DIAGNOSIS — Z23 Encounter for immunization: Secondary | ICD-10-CM | POA: Diagnosis not present

## 2016-01-07 DIAGNOSIS — E669 Obesity, unspecified: Secondary | ICD-10-CM | POA: Insufficient documentation

## 2016-01-07 DIAGNOSIS — M797 Fibromyalgia: Secondary | ICD-10-CM | POA: Diagnosis not present

## 2016-01-07 DIAGNOSIS — E038 Other specified hypothyroidism: Secondary | ICD-10-CM | POA: Diagnosis not present

## 2016-01-07 LAB — LIPID PANEL
CHOLESTEROL: 138 mg/dL (ref 0–200)
HDL: 31.8 mg/dL — AB (ref >39.00)
LDL Cholesterol: 83 mg/dL (ref 0–99)
NonHDL: 105.98
Total CHOL/HDL Ratio: 4
Triglycerides: 114 mg/dL (ref 0.0–149.0)
VLDL: 22.8 mg/dL (ref 0.0–40.0)

## 2016-01-07 LAB — BASIC METABOLIC PANEL
BUN: 11 mg/dL (ref 6–23)
CHLORIDE: 107 meq/L (ref 96–112)
CO2: 26 meq/L (ref 19–32)
CREATININE: 0.67 mg/dL (ref 0.40–1.20)
Calcium: 8.8 mg/dL (ref 8.4–10.5)
GFR: 102.66 mL/min (ref >60.00)
Glucose, Bld: 94 mg/dL (ref 70–99)
POTASSIUM: 4.7 meq/L (ref 3.5–5.1)
SODIUM: 138 meq/L (ref 135–145)

## 2016-01-07 LAB — CBC WITH DIFFERENTIAL/PLATELET
BASOS PCT: 1.2 % (ref 0.0–3.0)
Basophils Absolute: 0.1 10*3/uL (ref 0.0–0.1)
EOS PCT: 4.9 % (ref 0.0–5.0)
Eosinophils Absolute: 0.3 10*3/uL (ref 0.0–0.7)
HCT: 43.7 % (ref 36.0–46.0)
HEMOGLOBIN: 14.8 g/dL (ref 12.0–15.0)
Lymphocytes Relative: 38.2 % (ref 12.0–46.0)
Lymphs Abs: 2.6 10*3/uL (ref 0.7–4.0)
MCHC: 33.8 g/dL (ref 30.0–36.0)
MCV: 93.8 fl (ref 78.0–100.0)
MONO ABS: 0.4 10*3/uL (ref 0.1–1.0)
Monocytes Relative: 6.1 % (ref 3.0–12.0)
Neutro Abs: 3.3 10*3/uL (ref 1.4–7.7)
Neutrophils Relative %: 49.6 % (ref 43.0–77.0)
Platelets: 262 10*3/uL (ref 150.0–400.0)
RBC: 4.66 Mil/uL (ref 3.87–5.11)
RDW: 13.4 % (ref 11.5–15.5)
WBC: 6.7 10*3/uL (ref 4.0–10.5)

## 2016-01-07 LAB — HEPATIC FUNCTION PANEL
ALBUMIN: 3.7 g/dL (ref 3.5–5.2)
ALT: 17 U/L (ref 0–35)
AST: 12 U/L (ref 0–37)
Alkaline Phosphatase: 70 U/L (ref 39–117)
BILIRUBIN DIRECT: 0.1 mg/dL (ref 0.0–0.3)
BILIRUBIN TOTAL: 0.4 mg/dL (ref 0.2–1.2)
Total Protein: 6.5 g/dL (ref 6.0–8.3)

## 2016-01-07 LAB — TSH: TSH: 0.03 u[IU]/mL — AB (ref 0.35–4.50)

## 2016-01-07 MED ORDER — CLONAZEPAM 0.5 MG PO TABS
0.5000 mg | ORAL_TABLET | Freq: Two times a day (BID) | ORAL | 1 refills | Status: DC | PRN
Start: 2016-01-07 — End: 2016-08-16

## 2016-01-07 MED ORDER — DULOXETINE HCL 20 MG PO CPEP: 20.0000 mg | ORAL_CAPSULE | Freq: Every day | ORAL | 3 refills | Status: DC

## 2016-01-07 NOTE — Progress Notes (Signed)
   Subjective:    Patient ID: Sally [name], female    DOB: [birth year], 40 y.o.   MRN: [id number]  HPI Hypothyroid- chronic problem, on Levothyroxine 176mcg daily.  Denies fatigue, changes to skin/hair/nails  Obesity- chronic problem, BMI remains 41.  Pt is not able to exercise due to knee injury that requires surgery.  Admits to stress eating and not following a particular diet.  Fibro- chronic problem.  Currently only taking pain pill as needed.  Asking about a daily medication to control sxs.  'I really feel like I'm going to have a nervous breakdown'  Taking Clonazepam PRN but is almost out and needs refill.   Review of Systems For ROS see HPI     Objective:   Physical Exam  Constitutional: She is oriented to person, place, and time. She appears well-developed and well-nourished. No distress.  obese  HENT:  Head: Normocephalic and atraumatic.  Eyes: Conjunctivae and EOM are normal. Pupils are equal, round, and reactive to light.  Neck: Normal range of motion. Neck supple. No thyromegaly present.  Cardiovascular: Normal rate, regular rhythm, normal heart sounds and intact distal pulses.   No murmur heard. Pulmonary/Chest: Effort normal and breath sounds normal. No respiratory distress.  Abdominal: Soft. She exhibits no distension. There is no tenderness.  Musculoskeletal: She exhibits no edema.  Lymphadenopathy:    She has no cervical adenopathy.  Neurological: She is alert and oriented to person, place, and time.  Skin: Skin is warm and dry.  Psychiatric: She has a normal mood and affect. Her behavior is normal.  Vitals reviewed.         Assessment & Plan:

## 2016-01-07 NOTE — Assessment & Plan Note (Signed)
Chronic problem.  Stressed need for healthy diet since she is unable to exercise due to knee injury.  Check labs to risk stratify.  Will adjust tx as needed

## 2016-01-07 NOTE — Assessment & Plan Note (Signed)
Chronic problem.  On Levothyroxine and asymptomatic at this time.  Check labs.  Adjust meds prn

## 2016-01-07 NOTE — Patient Instructions (Signed)
Follow up in 3-4 weeks to recheck mood and pain We'll notify you of your lab results and make any changes if needed Start the Cymbalta daily for pain and mood Use the Clonazepam only as needed for high anxiety moments Try and make healthy food choices to help w/ weight loss Call with any questions or concerns Happy Holidays!!!

## 2016-01-07 NOTE — Assessment & Plan Note (Signed)
New to provider, ongoing for pt.  She is asking for daily controller medication to improve pain and anxiety.  Will start Cymbalta but at a very low dose to ensure that she is able to tolerate this.  Reviewed supportive care and red flags that should prompt return.  Pt expressed understanding and is in agreement w/ plan.

## 2016-01-07 NOTE — Progress Notes (Signed)
Pre visit review using our clinic review tool, if applicable. No additional management support is needed unless otherwise documented below in the visit note. 

## 2016-01-08 ENCOUNTER — Other Ambulatory Visit: Payer: Self-pay | Admitting: General Practice

## 2016-01-08 ENCOUNTER — Other Ambulatory Visit (INDEPENDENT_AMBULATORY_CARE_PROVIDER_SITE_OTHER): Payer: BC Managed Care – PPO

## 2016-01-08 DIAGNOSIS — E559 Vitamin D deficiency, unspecified: Secondary | ICD-10-CM

## 2016-01-08 MED ORDER — LEVOTHYROXINE SODIUM 137 MCG PO TABS: 137.0000 ug | ORAL_TABLET | Freq: Two times a day (BID) | ORAL | 6 refills | Status: DC

## 2016-01-09 LAB — VITAMIN D 25 HYDROXY (VIT D DEFICIENCY, FRACTURES): VIT D 25 HYDROXY: 34 ng/mL (ref 30–100)

## 2016-01-11 ENCOUNTER — Other Ambulatory Visit: Payer: Self-pay | Admitting: Obstetrics & Gynecology

## 2016-01-11 NOTE — Telephone Encounter (Signed)
Does patient need a refill on her Vitamin D? Her last Vit D check was on 01/08/16. Please advise.   Medication refill request: Vitamin D Last AEX: 04/03/15 SM Next AEX: 07/17/16 SM  Last MMG (if hormonal medication request): n/a Refill authorized: 06/27/15 #12 4R. Please advise. Thank you.

## 2016-01-21 DIAGNOSIS — G90529 Complex regional pain syndrome I of unspecified lower limb: Secondary | ICD-10-CM

## 2016-01-21 HISTORY — DX: Complex regional pain syndrome i of unspecified lower limb: G90.529

## 2016-01-28 ENCOUNTER — Encounter: Payer: Self-pay | Admitting: Family Medicine

## 2016-01-28 ENCOUNTER — Ambulatory Visit (INDEPENDENT_AMBULATORY_CARE_PROVIDER_SITE_OTHER): Payer: BC Managed Care – PPO | Admitting: Family Medicine

## 2016-01-28 VITALS — BP 116/81 | HR 80 | Temp 98.1°F | Resp 16 | Ht 68.0 in | Wt 265.4 lb

## 2016-01-28 DIAGNOSIS — N644 Mastodynia: Secondary | ICD-10-CM | POA: Insufficient documentation

## 2016-01-28 DIAGNOSIS — F419 Anxiety disorder, unspecified: Principal | ICD-10-CM

## 2016-01-28 DIAGNOSIS — E038 Other specified hypothyroidism: Secondary | ICD-10-CM | POA: Diagnosis not present

## 2016-01-28 DIAGNOSIS — F418 Other specified anxiety disorders: Secondary | ICD-10-CM

## 2016-01-28 DIAGNOSIS — F329 Major depressive disorder, single episode, unspecified: Secondary | ICD-10-CM

## 2016-01-28 DIAGNOSIS — F32A Depression, unspecified: Secondary | ICD-10-CM

## 2016-01-28 LAB — TSH: TSH: 0.04 u[IU]/mL — AB (ref 0.35–4.50)

## 2016-01-28 MED ORDER — VENLAFAXINE HCL ER 37.5 MG PO CP24: 37.5000 mg | ORAL_CAPSULE | Freq: Every day | ORAL | 3 refills | Status: DC

## 2016-01-28 NOTE — Progress Notes (Signed)
   Subjective:    Patient ID: Sally [name], female    DOB: Feb 02, 1975, 41 y.o.   MRN: [id number]  HPI Mood/fibro- pt was started on Cymbalta 20mg  at last visit.  Pt stopped the medication after 3-4 days b/c 'i was so freaking sick'.  + nausea, diarrhea.  Pt continues to have a lot of stress- is going to have to resign from work.  Hypothyroid- pt's TSH level too low at last visit so dose was decreased.  Pt feels since then, fatigue is worsening, skin around eyes is darkening.  Denies constipation.  + dry skin.  Breast change- pt reports burning and itching of R breast at nipple.  No drainage or d/c.  No color or texture change, 'it looks normal but it's about to drive me crazy'.  Applying lotion but this is not helping.   Review of Systems For ROS see HPI     Objective:   Physical Exam  Constitutional: She is oriented to person, place, and time. She appears well-developed and well-nourished. No distress.  HENT:  Head: Normocephalic and atraumatic.  Eyes: Conjunctivae and EOM are normal. Pupils are equal, round, and reactive to light.  Neck: Normal range of motion. Neck supple. No thyromegaly present.  Cardiovascular: Normal rate, regular rhythm, normal heart sounds and intact distal pulses.   No murmur heard. Pulmonary/Chest: Effort normal and breath sounds normal. No respiratory distress. Right breast exhibits mass (possible soft tissue mass 2 inches from areola at 12 o'clock) and tenderness (over R nipple and areola). Right breast exhibits no inverted nipple, no nipple discharge and no skin change. Left breast exhibits no inverted nipple, no mass, no nipple discharge, no skin change and no tenderness.  Abdominal: Soft. She exhibits no distension. There is no tenderness.  Musculoskeletal: She exhibits no edema.  Lymphadenopathy:    She has no cervical adenopathy.  Neurological: She is alert and oriented to person, place, and time.  Skin: Skin is warm and dry.  Psychiatric: She has a  normal mood and affect. Her behavior is normal.  Vitals reviewed.         Assessment & Plan:

## 2016-01-28 NOTE — Progress Notes (Signed)
Pre visit review using our clinic review tool, if applicable. No additional management support is needed unless otherwise documented below in the visit note. 

## 2016-01-28 NOTE — Patient Instructions (Signed)
Follow up in 1 month to recheck mood/anxiety We'll notify you of your lab results and make any changes if needed We'll call you with your diagnostic mammo Continue to apply lotion to the nipple at least twice daily.  If no improvement, please let me know Start the Venlafaxine daily to improve mood and pain Call with any questions or concerns Hang in there!!!

## 2016-01-29 ENCOUNTER — Other Ambulatory Visit: Payer: Self-pay | Admitting: General Practice

## 2016-01-29 DIAGNOSIS — E038 Other specified hypothyroidism: Secondary | ICD-10-CM

## 2016-01-29 MED ORDER — LEVOTHYROXINE SODIUM 112 MCG PO TABS: 112.0000 ug | ORAL_TABLET | Freq: Every day | ORAL | 3 refills | Status: DC

## 2016-01-29 NOTE — Assessment & Plan Note (Signed)
New.  Pt reports burning and itching in nipple.  No skin abnormalities noted.  Pt w/ possible soft tissue mass 2 inches superior to areola.  Will get diagnostic mammo.  Pt expressed understanding and is in agreement w/ plan.

## 2016-01-29 NOTE — Assessment & Plan Note (Signed)
Ongoing issue.  Was not able to tolerate Cymbalta due to diarrhea and nausea.  Start Effexor and monitor for improvement.  Pt expressed understanding and is in agreement w/ plan.

## 2016-01-29 NOTE — Assessment & Plan Note (Signed)
Pt is symptomatic at this time.  Her TSH was suppressed at last visit and Levothyroxine was decreased.  Repeat TSH today and adjust meds prn.  Pt expressed understanding and is in agreement w/ plan.

## 2016-01-31 ENCOUNTER — Other Ambulatory Visit: Payer: Self-pay | Admitting: Family Medicine

## 2016-01-31 DIAGNOSIS — N644 Mastodynia: Secondary | ICD-10-CM

## 2016-02-08 ENCOUNTER — Other Ambulatory Visit: Payer: Self-pay | Admitting: General Practice

## 2016-02-08 MED ORDER — LEVOTHYROXINE SODIUM 112 MCG PO TABS: 112.0000 ug | ORAL_TABLET | Freq: Two times a day (BID) | ORAL | 3 refills | Status: DC

## 2016-02-11 ENCOUNTER — Other Ambulatory Visit: Payer: Self-pay

## 2016-02-18 ENCOUNTER — Ambulatory Visit
Admission: RE | Admit: 2016-02-18 | Discharge: 2016-02-18 | Disposition: A | Discharge status: Home / Self Care | Payer: BC Managed Care – PPO | Source: Ambulatory Visit | Attending: Family Medicine | Admitting: Family Medicine

## 2016-02-18 DIAGNOSIS — N644 Mastodynia: Secondary | ICD-10-CM

## 2016-02-28 ENCOUNTER — Ambulatory Visit: Payer: Self-pay | Admitting: Family Medicine

## 2016-03-06 ENCOUNTER — Encounter: Payer: Self-pay | Admitting: Family Medicine

## 2016-03-06 ENCOUNTER — Ambulatory Visit (INDEPENDENT_AMBULATORY_CARE_PROVIDER_SITE_OTHER): Payer: BC Managed Care – PPO | Admitting: Family Medicine

## 2016-03-06 VITALS — BP 120/82 | HR 91 | Temp 98.6°F | Resp 16 | Ht 68.0 in | Wt 265.0 lb

## 2016-03-06 DIAGNOSIS — E038 Other specified hypothyroidism: Secondary | ICD-10-CM | POA: Diagnosis not present

## 2016-03-06 DIAGNOSIS — F418 Other specified anxiety disorders: Secondary | ICD-10-CM

## 2016-03-06 DIAGNOSIS — F329 Major depressive disorder, single episode, unspecified: Secondary | ICD-10-CM

## 2016-03-06 DIAGNOSIS — F419 Anxiety disorder, unspecified: Principal | ICD-10-CM

## 2016-03-06 DIAGNOSIS — D2272 Melanocytic nevi of left lower limb, including hip: Secondary | ICD-10-CM

## 2016-03-06 DIAGNOSIS — F32A Depression, unspecified: Secondary | ICD-10-CM

## 2016-03-06 DIAGNOSIS — M797 Fibromyalgia: Secondary | ICD-10-CM

## 2016-03-06 LAB — T4, FREE: FREE T4: 1.57 ng/dL (ref 0.60–1.60)

## 2016-03-06 LAB — T3, FREE: T3, Free: 3.8 pg/mL (ref 2.3–4.2)

## 2016-03-06 LAB — TSH: TSH: 0.04 u[IU]/mL — AB (ref 0.35–4.50)

## 2016-03-06 MED ORDER — MELOXICAM 15 MG PO TABS: 15.0000 mg | ORAL_TABLET | Freq: Every day | ORAL | 3 refills | Status: DC

## 2016-03-06 MED ORDER — VENLAFAXINE HCL ER 75 MG PO CP24: 75.0000 mg | ORAL_CAPSULE | Freq: Every day | ORAL | 3 refills | Status: DC

## 2016-03-06 NOTE — Assessment & Plan Note (Signed)
Chronic problem.  Restart daily Mobic for pain relief to avoid opiate pain medication.  Will follow.

## 2016-03-06 NOTE — Assessment & Plan Note (Signed)
Chronic problem.  sxs are improving since starting Effexor.  Will increase dose for additional improvement and monitor for results vs side effects from increased medication.

## 2016-03-06 NOTE — Progress Notes (Signed)
Pre visit review using our clinic review tool, if applicable. No additional management support is needed unless otherwise documented below in the visit note. 

## 2016-03-06 NOTE — Assessment & Plan Note (Signed)
Chronic problem.  We have been adjusting her medication dose due to suppressed TSH.  Repeat labs today and adjust meds prn.

## 2016-03-06 NOTE — Progress Notes (Signed)
   Subjective:    Patient ID: EDID FUSTER, female    DOB: 1975/04/18, 41 y.o.   MRN: [id number]  HPI Anxiety/depression- ongoing issue for pt.  On Effexor 37.5mg  daily w/ some improvement.  Pt feels that she wold benefit from an increased dose.  Pt reports less irritability.  No weight change.  Has a lot upcoming w/ knee surgery and need to resign from work.  Hypothyroid- we again decreased levothyroxine dose at last visit due to TSH of 0.04.  Now on 123mcg daily   Review of Systems For ROS see HPI     Objective:   Physical Exam  Constitutional: She is oriented to person, place, and time. She appears well-developed and well-nourished. No distress.  HENT:  Head: Normocephalic and atraumatic.  Neurological: She is alert and oriented to person, place, and time.  Skin: Skin is warm and dry. No erythema.  Atypical nevus of L hip  Psychiatric: She has a normal mood and affect. Her behavior is normal. Thought content normal.  Vitals reviewed.         Assessment & Plan:

## 2016-03-06 NOTE — Patient Instructions (Addendum)
Schedule your complete physical in 6 months We'll notify you of your lab results and make any changes if needed We'll call you with your Dermatology appt Restart the Meloxicam daily- take w/ food Increase the Venlafaxine to 75mg  daily- take in the morning Call with any questions or concerns Happy Spring!!!

## 2016-03-07 ENCOUNTER — Other Ambulatory Visit: Payer: Self-pay | Admitting: Family Medicine

## 2016-03-07 DIAGNOSIS — E038 Other specified hypothyroidism: Secondary | ICD-10-CM

## 2016-03-28 DIAGNOSIS — G43909 Migraine, unspecified, not intractable, without status migrainosus: Secondary | ICD-10-CM | POA: Insufficient documentation

## 2016-04-02 ENCOUNTER — Ambulatory Visit (INDEPENDENT_AMBULATORY_CARE_PROVIDER_SITE_OTHER): Payer: BC Managed Care – PPO | Admitting: Endocrinology

## 2016-04-02 ENCOUNTER — Encounter: Payer: Self-pay | Admitting: Endocrinology

## 2016-04-02 VITALS — BP 130/98 | HR 79 | Ht 68.0 in | Wt 265.0 lb

## 2016-04-02 DIAGNOSIS — F32A Depression, unspecified: Secondary | ICD-10-CM

## 2016-04-02 DIAGNOSIS — E063 Autoimmune thyroiditis: Secondary | ICD-10-CM

## 2016-04-02 DIAGNOSIS — F329 Major depressive disorder, single episode, unspecified: Secondary | ICD-10-CM | POA: Diagnosis not present

## 2016-04-02 DIAGNOSIS — E038 Other specified hypothyroidism: Secondary | ICD-10-CM

## 2016-04-02 MED ORDER — LEVOTHYROXINE SODIUM 175 MCG PO TABS: 175.0000 ug | ORAL_TABLET | Freq: Every day | ORAL | 3 refills | Status: DC

## 2016-04-02 MED ORDER — LIOTHYRONINE SODIUM 5 MCG PO TABS: 10.0000 ug | ORAL_TABLET | Freq: Every day | ORAL | 2 refills | Status: DC

## 2016-04-02 NOTE — Patient Instructions (Signed)
Take meds in am

## 2016-04-02 NOTE — Progress Notes (Signed)
Patient ID: Sally [name], female   DOB: 08-Nov-1975, 41 y.o.   MRN: [id number]             Referring Physician: Birdie Riddle  Reason for Appointment:  Hypothyroidism, new visit    History of Present Illness:   Hypothyroidism was first diagnosed in 1998 about 4 months after her pregnancy  At the time of diagnosis patient was having symptoms of  fatigue, depression, weight gain but does not remember other symptoms  With starting thyroid supplementation the patient's symptoms  improved  However she apparently she had needed progressively higher doses of thyroid supplementation For several years she was taking 300 g of levothyroxine had felt fairly good This is despite review of her labs since 2011 indicating that most of her TSH levels have been low except twice In 2016 her TSH was 37.8 when she was out of her medication  More recently the patient is complaining about difficulty with weight loss as well as symptoms of depression, fatigue, lethargy and difficulty focusing and concentrating.  She has also had some dry skin but no hair loss, has some constipation      Since her TSH was low in 12/17 and her dose was reduced down to 137 g, 2 tablets daily and then twice again because of continued low TSH She is now taking 2 tablets of the 112 g levothyroxine  She takes her levothyroxine tablets at bedtime without any food has been doing this for some time  She has been referred here because of difficulty getting her thyroid levels normal and because of continued fatigue         Patient's weight history is as follows:   Wt Readings from Last 3 Encounters:  04/02/16 265 lb (120.2 kg)  03/06/16 265 lb (120.2 kg)  01/28/16 265 lb 6 oz (120.4 kg)    Thyroid function results have been as follows:  Lab Results  Component Value Date   FREET4 1.57 03/06/2016   TSH 0.04 (L) 03/06/2016   TSH 0.04 (L) 01/28/2016   TSH 0.03 (L) 01/07/2016    Lab Results  Component Value Date   T3FREE 3.8  03/06/2016    Lab Results  Component Value Date   TSH 0.04 (L) 03/06/2016   TSH 0.04 (L) 01/28/2016   TSH 0.03 (L) 01/07/2016   TSH 0.59 04/03/2015   TSH 37.809 (H) 11/03/2014   TSH 0.014 (L) 07/27/2014   TSH 0.720 09/04/2013   TSH 0.013 (L) 10/07/2009   TSH 0.043 (L) 09/17/2009     Past Medical History:  Diagnosis Date  . Acute appendicitis 09/09/2012  . Anxiety   . Asthma   . Back pain    Occ. issues wih back pain  . Back pain 09/09/2012   It is intermittent now,  previously source of fibromyalgia dx.    . Fibromyalgia   . Headache   . Hypothyroid 09/09/2012  . Hypothyroidism   . Osteoarthritis   . Precancerous changes of the cervix   . Sleep apnea    pre lap banding-never used cpap or mask  . Vitamin D deficiency     Past Surgical History:  Procedure Laterality Date  . APPENDECTOMY    . BILATERAL SALPINGECTOMY Bilateral 11/05/2015   Procedure: BILATERAL SALPINGECTOMY;  Surgeon: [name], MD;  Location: [facility];  Service: Gynecology;  Laterality: Bilateral;  . BRAIN SURGERY  1992   remove blood clot after a fall  . CESAREAN SECTION  1997, 2013  . CYSTOSCOPY  N/A 11/05/2015   Procedure: CYSTOSCOPY;  Surgeon: [name], MD;  Location: [facility];  Service: Gynecology;  Laterality: N/A;  . GASTRIC BANDING PORT REVISION  01/20/2012   Procedure: GASTRIC BANDING PORT REVISION;  Surgeon: Pedro Earls, MD;  Location: [facility];  Service: General;  Laterality: N/A;  . KNEE ARTHROSCOPY WITH DRILLING/MICROFRACTURE Right 12/06/2014   Procedure: KNEE ARTHROSCOPY WITH DRILLING/MICROFRACTURE, CHONDROPLASTY, EXCISION OF PLICA;  Surgeon: Dorna Leitz, MD;  Location: [facility];  Service: Orthopedics;  Laterality: Right;  . KNEE SURGERY    . LAPAROSCOPIC APPENDECTOMY N/A 09/09/2012   Procedure: APPENDECTOMY LAPAROSCOPIC;  Surgeon: Merrie Roof, MD;  Location: [facility];  Service: General;  Laterality: N/A;  . [place]  . LAPAROSCOPIC  HYSTERECTOMY N/A 11/05/2015   Procedure: HYSTERECTOMY TOTAL LAPAROSCOPIC;  Surgeon: [name], MD;  Location: [facility];  Service: Gynecology;  Laterality: N/A;  . LAPAROSCOPY  08/19/2010   Procedure: LAPAROSCOPY OPERATIVE;  Surgeon: Felipa Emory;  Location: [facility];  Service: Gynecology;  Laterality: N/A;  with Biopsy of uterine serosa  . TUBAL LIGATION     8/13    Family History  Problem Relation Age of Onset  . Hypertension Maternal Grandfather   . Diabetes Paternal Grandmother   . Anesthesia problems Neg Hx     Social History:  reports that she has been smoking Cigarettes.  She has a 20.00 pack-year smoking history. She has never used smokeless tobacco. She reports that she drinks alcohol. She reports that she does not use drugs.  Allergies:  Allergies  Allergen Reactions  . Paroxetine Hcl Rash  . Percocet [Oxycodone-Acetaminophen] Itching  . Codeine Other (See Comments)    Puts patient to sleep "knocks her out" for at least two days, regardless of dosage.    Allergies as of 04/02/2016      Reactions   Paroxetine Hcl Rash   Percocet [oxycodone-acetaminophen] Itching   Codeine Other (See Comments)   Puts patient to sleep "knocks her out" for at least two days, regardless of dosage.      Medication List       Accurate as of 04/02/16  2:40 PM. Always use your most recent med list.          albuterol 108 (90 Base) MCG/ACT inhaler Commonly known as:  PROVENTIL HFA;VENTOLIN HFA Inhale 1-2 puffs into the lungs every 6 (six) hours as needed for wheezing or shortness of breath.   PROAIR RESPICLICK 644 (90 Base) MCG/ACT Aepb Generic drug:  Albuterol Sulfate TAKE 2 PUFFS EVERY 6 HOURS   clonazePAM 0.5 MG tablet Commonly known as:  KLONOPIN Take 1 tablet (0.5 mg total) by mouth 2 (two) times daily as needed for anxiety.   levothyroxine 112 MCG tablet Commonly known as:  SYNTHROID, LEVOTHROID Take 1 tablet (112 mcg total) by mouth 2 (two) times daily.   loratadine 10  MG tablet Commonly known as:  CLARITIN Take 10 mg by mouth daily as needed for allergies.   meloxicam 15 MG tablet Commonly known as:  MOBIC Take 1 tablet (15 mg total) by mouth daily.   oxyCODONE 5 MG immediate release tablet Commonly known as:  ROXICODONE Take 1 tablet (5 mg total) by mouth every 4 (four) hours as needed for moderate pain or severe pain.   ranitidine 150 MG tablet Commonly known as:  ZANTAC Take 1 tablet (150 mg total) by mouth 2 (two) times daily.   traMADol 50 MG tablet Commonly  known as:  ULTRAM Take 1-2 tablets (50-100 mg total) by mouth every 6 (six) hours as needed for moderate pain.   triamcinolone ointment 0.5 % Commonly known as:  KENALOG Apply 1 application topically 2 (two) times daily.   venlafaxine XR 75 MG 24 hr capsule Commonly known as:  EFFEXOR-XR Take 1 capsule (75 mg total) by mouth daily with breakfast.   Vitamin D (Ergocalciferol) 50000 units Caps capsule Commonly known as:  DRISDOL TAKE 1 CAPSULE (50,000 UNITS TOTAL) BY MOUTH EVERY 7 (SEVEN) DAYS.          Review of Systems  Constitutional: Positive for weight loss and malaise.  HENT: Negative for headaches.   Eyes: Negative for blurred vision.  Respiratory: Negative for shortness of breath.   Cardiovascular: Negative for leg swelling.  Gastrointestinal: Positive for constipation.  Endocrine: Positive for cold intolerance.  Genitourinary: Negative for frequency.  Musculoskeletal: Positive for joint pain.  Skin: Negative for rash.       No loss of hair  Neurological: Negative for weakness, numbness and tingling.  Psychiatric/Behavioral: Positive for depressed mood, nervousness and insomnia.       Started treatment in 1/18 with mild improvement in symptoms, previously was having crying spells                Examination:    BP (!) 130/98   Pulse 79   Ht 5\' 8"  (1.727 m)   Wt 265 lb (120.2 kg)   LMP 10/26/2015 (Exact Date)   SpO2 98%   BMI 40.29 kg/m    GENERAL:  Average build.   She has generalized obesity , no cushingoid features  No pallor, clubbing, lymphadenopathy or edema.    Skin:  no rash    EYES:  No prominence of the eyes or swelling of the eyelids.  She has thinning of the eyebrows especially laterally  ENT: Oral mucosa and tongue normal.  THYROID:  Not palpable.  HEART:  Normal  S1 and S2; no murmur or click.  CHEST:    Lungs: Vescicular breath sounds heard equally.  No crepitations/ wheeze.  ABDOMEN:  No distention.  Liver and spleen not palpable.  No other mass or tenderness.  NEUROLOGICAL: Reflexes are bilaterally  normal at biceps  and ankles.  JOINTS:  Normal.   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 She has for several years been taking very large doses of levothyroxine supplements, as much as 300 g But with this her TSH tests have usually been in the hyperthyroid range Recently with reducing her thyroxine doses her TSH is still low although free T4 and free T3 levels are upper normal  Patient complains of fatigue, difficulty focusing mentally, depression and difficulty with weight gain This is despite starting a low dose Effexor in 1/18  Not clear if her symptoms are entirely related to hypothyroidism or to untreated depression  PLAN:   We will empirically given a trial of a combination of Cytomel and lower doses of levothyroxine to see if she was subjectively feeling better Since her TSH is still low we will reduce her levothyroxine down to 175 g daily She will start with 10 g of Cytomel which would be the equivalent of About 40 g of levothyroxine Also instructed her to take her thyroid medication before breakfast 30 minutes before eating . Discussed that if she is not subjectively better and her labs are looking normal she may need to be treated differently for her depression by her  PCP  Follow-up in 6 weeks   Sally [name] 04/02/2016, 2:40 PM    Consultation note copy sent to the PCP  Note: This office note was prepared with Dragon voice recognition system technology. Any transcriptional errors that result from this process are unintentional.

## 2016-04-03 ENCOUNTER — Telehealth: Payer: Self-pay | Admitting: Endocrinology

## 2016-04-03 NOTE — Telephone Encounter (Signed)
Cora calling from [facility] pre op need clinic notes and lab from yesterday visit  Fax [phone] yesterday

## 2016-05-01 ENCOUNTER — Telehealth: Payer: Self-pay | Admitting: Endocrinology

## 2016-05-01 NOTE — Telephone Encounter (Signed)
error 

## 2016-05-01 NOTE — Telephone Encounter (Signed)
Sally [name] called again to request a fax of the after visit summary and labs from 3/14 visit to [phone].

## 2016-05-01 NOTE — Telephone Encounter (Signed)
Faxed today

## 2016-05-09 ENCOUNTER — Other Ambulatory Visit (INDEPENDENT_AMBULATORY_CARE_PROVIDER_SITE_OTHER): Payer: BC Managed Care – PPO

## 2016-05-09 DIAGNOSIS — E063 Autoimmune thyroiditis: Secondary | ICD-10-CM | POA: Diagnosis not present

## 2016-05-09 LAB — TSH: TSH: 0.31 u[IU]/mL — ABNORMAL LOW (ref 0.35–4.50)

## 2016-05-09 LAB — T3, FREE: T3 FREE: 2.8 pg/mL (ref 2.3–4.2)

## 2016-05-09 LAB — T4, FREE: FREE T4: 0.78 ng/dL (ref 0.60–1.60)

## 2016-05-14 ENCOUNTER — Encounter: Payer: Self-pay | Admitting: Endocrinology

## 2016-05-14 ENCOUNTER — Ambulatory Visit (INDEPENDENT_AMBULATORY_CARE_PROVIDER_SITE_OTHER): Payer: BC Managed Care – PPO | Admitting: Endocrinology

## 2016-05-14 VITALS — BP 132/90 | HR 72 | Ht 68.0 in | Wt 269.0 lb

## 2016-05-14 DIAGNOSIS — E063 Autoimmune thyroiditis: Secondary | ICD-10-CM

## 2016-05-14 NOTE — Patient Instructions (Signed)
Levothyroxine except 1/2 on Mon and Thurs  Cytomel 2 in am and 1 at lunch

## 2016-05-14 NOTE — Progress Notes (Signed)
Patient ID: Sally [name], female   DOB: 09-Jan-1976, 41 y.o.   MRN: [id number]             Referring Physician: Birdie Riddle  Reason for Appointment:  Hypothyroidism, new visit    History of Present Illness:   Hypothyroidism was first diagnosed in 1998 about 4 months after her pregnancy  At the time of diagnosis patient was having symptoms of  fatigue, depression, weight gain but does not remember other symptoms  With starting thyroid supplementation the patient's symptoms  improved  However she apparently she had needed progressively higher doses of thyroid supplementation For several years she was taking 300 g of levothyroxine had felt fairly good This is despite review of her labs since 2011 indicating that most of her TSH levels have been low except twice In 2016 her TSH was 37.8 when she was out of her medication  On her initial consultation she was complaining about difficulty with weight loss as well as symptoms of depression, fatigue, lethargy and difficulty focusing and concentrating.  She has also had some dry skin but no hair loss, has some constipation     This was despite her TSH level being low normal or low With progressively reducing her levothyroxine dose her TSH was still low as of 2/80  RECENT history:  She was empirically started on Cytomel in addition to a lower dose of levothyroxine to see if she would subjectively feels better even though her free T3 level was excellent at 3.8 She is now taking 10 g of liothyronine along with 175 g of levothyroxine in the morning   Patient said that she is feeling about 30% better with more energy level, less depressed feeling She has gained 4 pounds, currently waiting for knee surgery         Patient's weight history is as follows:   Wt Readings from Last 3 Encounters:  05/14/16 269 lb (122 kg)  04/02/16 265 lb (120.2 kg)  03/06/16 265 lb (120.2 kg)    THYROID functions done recently indicated her TSH is nearly normal  now with relatively lower free T4 and free T3 levels   Thyroid function results have been as follows:  Lab Results  Component Value Date   FREET4 0.78 05/09/2016   FREET4 1.57 03/06/2016   TSH 0.31 (L) 05/09/2016   TSH 0.04 (L) 03/06/2016   TSH 0.04 (L) 01/28/2016    Lab Results  Component Value Date   T3FREE 2.8 05/09/2016   T3FREE 3.8 03/06/2016    Lab Results  Component Value Date   TSH 0.31 (L) 05/09/2016   TSH 0.04 (L) 03/06/2016   TSH 0.04 (L) 01/28/2016   TSH 0.03 (L) 01/07/2016   TSH 0.59 04/03/2015   TSH 37.809 (H) 11/03/2014   TSH 0.014 (L) 07/27/2014   TSH 0.720 09/04/2013   TSH 0.013 (L) 10/07/2009     Past Medical History:  Diagnosis Date  . Acute appendicitis 09/09/2012  . Anxiety   . Asthma   . Back pain    Occ. issues wih back pain  . Back pain 09/09/2012   It is intermittent now,  previously source of fibromyalgia dx.    . Fibromyalgia   . Headache   . Hypothyroid 09/09/2012  . Hypothyroidism   . Osteoarthritis   . Precancerous changes of the cervix   . Sleep apnea    pre lap banding-never used cpap or mask  . Vitamin D deficiency     Past Surgical History:  Procedure Laterality Date  . APPENDECTOMY    . BILATERAL SALPINGECTOMY Bilateral 11/05/2015   Procedure: BILATERAL SALPINGECTOMY;  Surgeon: [name], MD;  Location: [facility];  Service: Gynecology;  Laterality: Bilateral;  . BRAIN SURGERY  1992   remove blood clot after a fall  . CESAREAN SECTION  1997, 2013  . CYSTOSCOPY N/A 11/05/2015   Procedure: CYSTOSCOPY;  Surgeon: [name], MD;  Location: [facility];  Service: Gynecology;  Laterality: N/A;  . GASTRIC BANDING PORT REVISION  01/20/2012   Procedure: GASTRIC BANDING PORT REVISION;  Surgeon: Pedro Earls, MD;  Location: [facility];  Service: General;  Laterality: N/A;  . KNEE ARTHROSCOPY WITH DRILLING/MICROFRACTURE Right 12/06/2014   Procedure: KNEE ARTHROSCOPY WITH DRILLING/MICROFRACTURE, CHONDROPLASTY, EXCISION OF PLICA;   Surgeon: Dorna Leitz, MD;  Location: [facility];  Service: Orthopedics;  Laterality: Right;  . KNEE SURGERY    . LAPAROSCOPIC APPENDECTOMY N/A 09/09/2012   Procedure: APPENDECTOMY LAPAROSCOPIC;  Surgeon: Merrie Roof, MD;  Location: [facility];  Service: General;  Laterality: N/A;  . [place]  . LAPAROSCOPIC HYSTERECTOMY N/A 11/05/2015   Procedure: HYSTERECTOMY TOTAL LAPAROSCOPIC;  Surgeon: [name], MD;  Location: [facility];  Service: Gynecology;  Laterality: N/A;  . LAPAROSCOPY  08/19/2010   Procedure: LAPAROSCOPY OPERATIVE;  Surgeon: Felipa Emory;  Location: [facility];  Service: Gynecology;  Laterality: N/A;  with Biopsy of uterine serosa  . TUBAL LIGATION     8/13    Family History  Problem Relation Age of Onset  . Hypertension Maternal Grandfather   . Diabetes Paternal Grandmother   . Anesthesia problems Neg Hx     Social History:  reports that she has been smoking Cigarettes.  She has a 20.00 pack-year smoking history. She has never used smokeless tobacco. She reports that she drinks alcohol. She reports that she does not use drugs.  Allergies:  Allergies  Allergen Reactions  . Paroxetine Hcl Rash  . Percocet [Oxycodone-Acetaminophen] Itching  . Codeine Other (See Comments)    Puts patient to sleep "knocks her out" for at least two days, regardless of dosage.    Allergies as of 05/14/2016      Reactions   Paroxetine Hcl Rash   Percocet [oxycodone-acetaminophen] Itching   Codeine Other (See Comments)   Puts patient to sleep "knocks her out" for at least two days, regardless of dosage.      Medication List       Accurate as of 05/14/16  2:00 PM. Always use your most recent med list.          albuterol 108 (90 Base) MCG/ACT inhaler Commonly known as:  PROVENTIL HFA;VENTOLIN HFA Inhale 1-2 puffs into the lungs every 6 (six) hours as needed for wheezing or shortness of breath.   PROAIR RESPICLICK 793 (90 Base) MCG/ACT  Aepb Generic drug:  Albuterol Sulfate TAKE 2 PUFFS EVERY 6 HOURS   clonazePAM 0.5 MG tablet Commonly known as:  KLONOPIN Take 1 tablet (0.5 mg total) by mouth 2 (two) times daily as needed for anxiety.   levothyroxine 175 MCG tablet Commonly known as:  SYNTHROID Take 1 tablet (175 mcg total) by mouth daily before breakfast.   liothyronine 5 MCG tablet Commonly known as:  CYTOMEL Take 2 tablets (10 mcg total) by mouth daily.   loratadine 10 MG tablet Commonly known as:  CLARITIN Take 10 mg by mouth daily as needed for allergies.   meloxicam 15 MG tablet  Commonly known as:  MOBIC Take 1 tablet (15 mg total) by mouth daily.   oxyCODONE 5 MG immediate release tablet Commonly known as:  ROXICODONE Take 1 tablet (5 mg total) by mouth every 4 (four) hours as needed for moderate pain or severe pain.   ranitidine 150 MG tablet Commonly known as:  ZANTAC Take 1 tablet (150 mg total) by mouth 2 (two) times daily.   traMADol 50 MG tablet Commonly known as:  ULTRAM Take 1-2 tablets (50-100 mg total) by mouth every 6 (six) hours as needed for moderate pain.   triamcinolone ointment 0.5 % Commonly known as:  KENALOG Apply 1 application topically 2 (two) times daily.   venlafaxine XR 75 MG 24 hr capsule Commonly known as:  EFFEXOR-XR Take 1 capsule (75 mg total) by mouth daily with breakfast.   Vitamin D (Ergocalciferol) 50000 units Caps capsule Commonly known as:  DRISDOL TAKE 1 CAPSULE (50,000 UNITS TOTAL) BY MOUTH EVERY 7 (SEVEN) DAYS.          Review of Systems  Constitutional: Positive for weight loss and malaise.  HENT: Negative for headaches.   Eyes: Negative for blurred vision.  Respiratory: Negative for shortness of breath.   Cardiovascular: Negative for leg swelling.  Gastrointestinal: Positive for constipation.  Endocrine: Positive for cold intolerance.  Genitourinary: Negative for frequency.  Musculoskeletal: Positive for joint pain.  Skin: Negative for  rash.       No loss of hair  Neurological: Negative for weakness, numbness and tingling.  Psychiatric/Behavioral: Positive for depressed mood, nervousness and insomnia.       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  Currently with taking levothyroxine 175 g and Cytomel 10 g daily in the morning she has had some improvement in her fatigue, mental focusing and mood This is despite her TSH being not a suppressed and free T3 level being lower than before, the level was drawn in the afternoon She is also on Effexor for depression   PLAN:   We will have her try taking additional 5 g of liothyronine at lunchtime, this will give her her equivalent of another 20 g of levothyroxine daily  Meanwhile she will reduce her Synthroid dose by half tablet twice a week since her TSH is still low normal  She can have her knee surgery from the thyroid standpoint  Follow-up in 8 weeks   Abiola Behring 05/14/2016, 2:00 PM      Note: This office note was prepared with Estate agent. Any transcriptional errors that result from this process are unintentional.

## 2016-06-02 ENCOUNTER — Telehealth: Payer: Self-pay | Admitting: Endocrinology

## 2016-06-02 MED ORDER — LIOTHYRONINE SODIUM 5 MCG PO TABS: 10.0000 ug | ORAL_TABLET | Freq: Every day | ORAL | 3 refills | Status: DC

## 2016-06-02 NOTE — Telephone Encounter (Signed)
Pt called in and said that Dr. Dwyane Dee upped her Liothyronine from 2x daily to 3x daily and that she needs a new script reflecting the new dosage because she is almost out of the medication.  Needs to be sent to the [facility] on [address].

## 2016-06-02 NOTE — Telephone Encounter (Signed)
Refill submitted for 3 tablets daily.

## 2016-06-03 ENCOUNTER — Other Ambulatory Visit: Payer: Self-pay

## 2016-06-03 MED ORDER — LIOTHYRONINE SODIUM 5 MCG PO TABS: 10.0000 ug | ORAL_TABLET | Freq: Every day | ORAL | 3 refills | Status: DC

## 2016-06-22 ENCOUNTER — Other Ambulatory Visit: Payer: Self-pay | Admitting: Obstetrics & Gynecology

## 2016-06-23 NOTE — Telephone Encounter (Signed)
Medication refill request: Vit D Last AEX:  04/03/15 SM Next AEX: 07/15/16 SM Last MMG (if hormonal medication request): 02/18/16 Dx Bilateral, U/S R Breast [facility] Refill authorized: 01/11/16 #12 0R. Please advise. Thank you.

## 2016-06-26 HISTORY — PX: KNEE SURGERY: SHX244

## 2016-07-04 ENCOUNTER — Other Ambulatory Visit: Payer: Self-pay

## 2016-07-06 IMAGING — CR DG ELBOW COMPLETE 3+V*L*
4 series · 4 of 4 positions shown · non-contrast
Comparison: None.

CLINICAL DATA: Tripped and fell. Landed on elbow. Left elbow pain
and decreased range of motion. Initial encounter.

EXAM:
LEFT ELBOW - COMPLETE 3+ VIEW

[x elbow ap left]
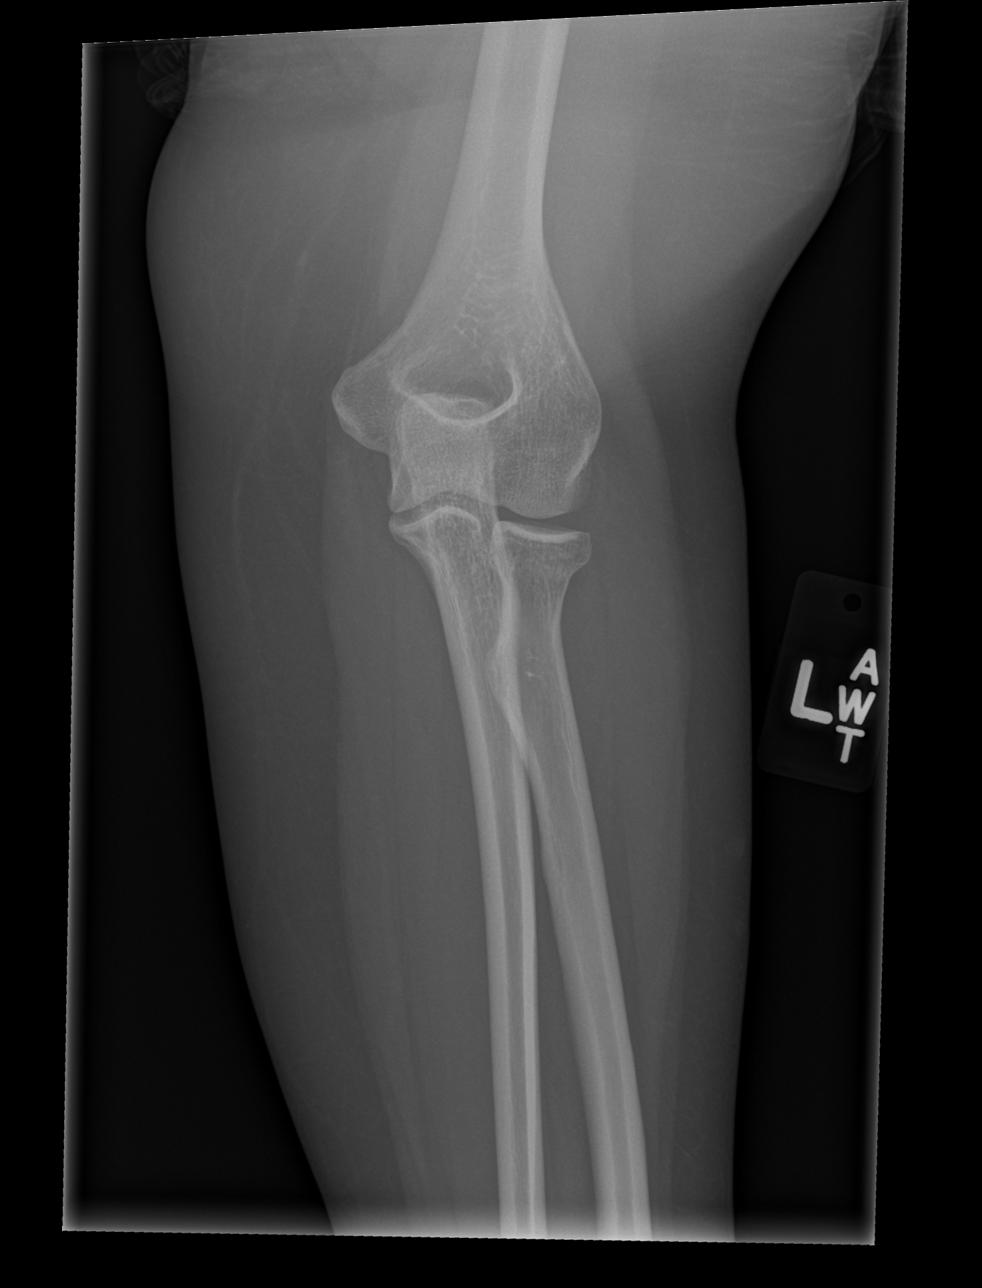

[x elbow obl left (1 of 2)]
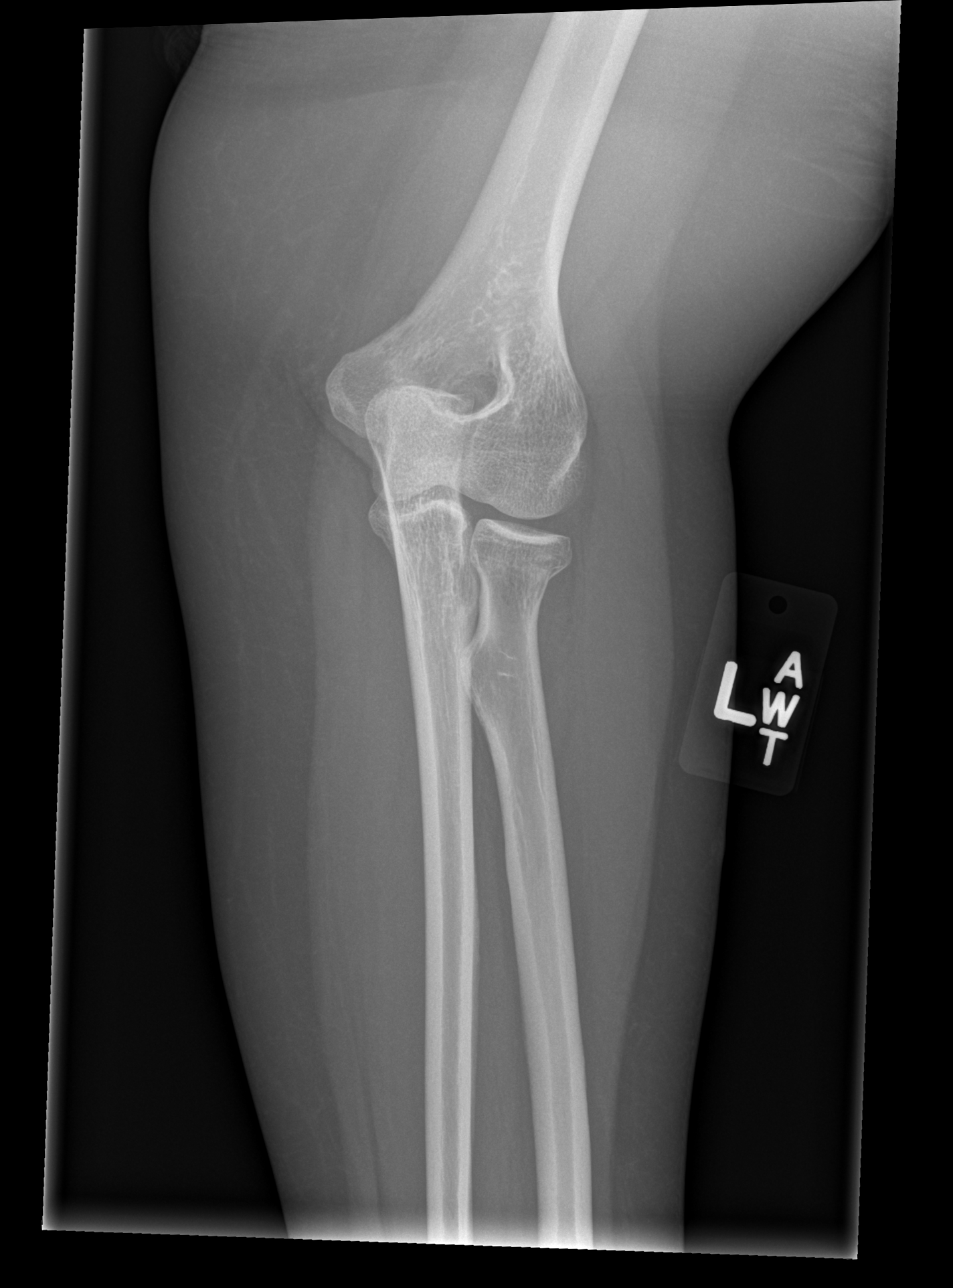

[x elbow obl left (2 of 2)]
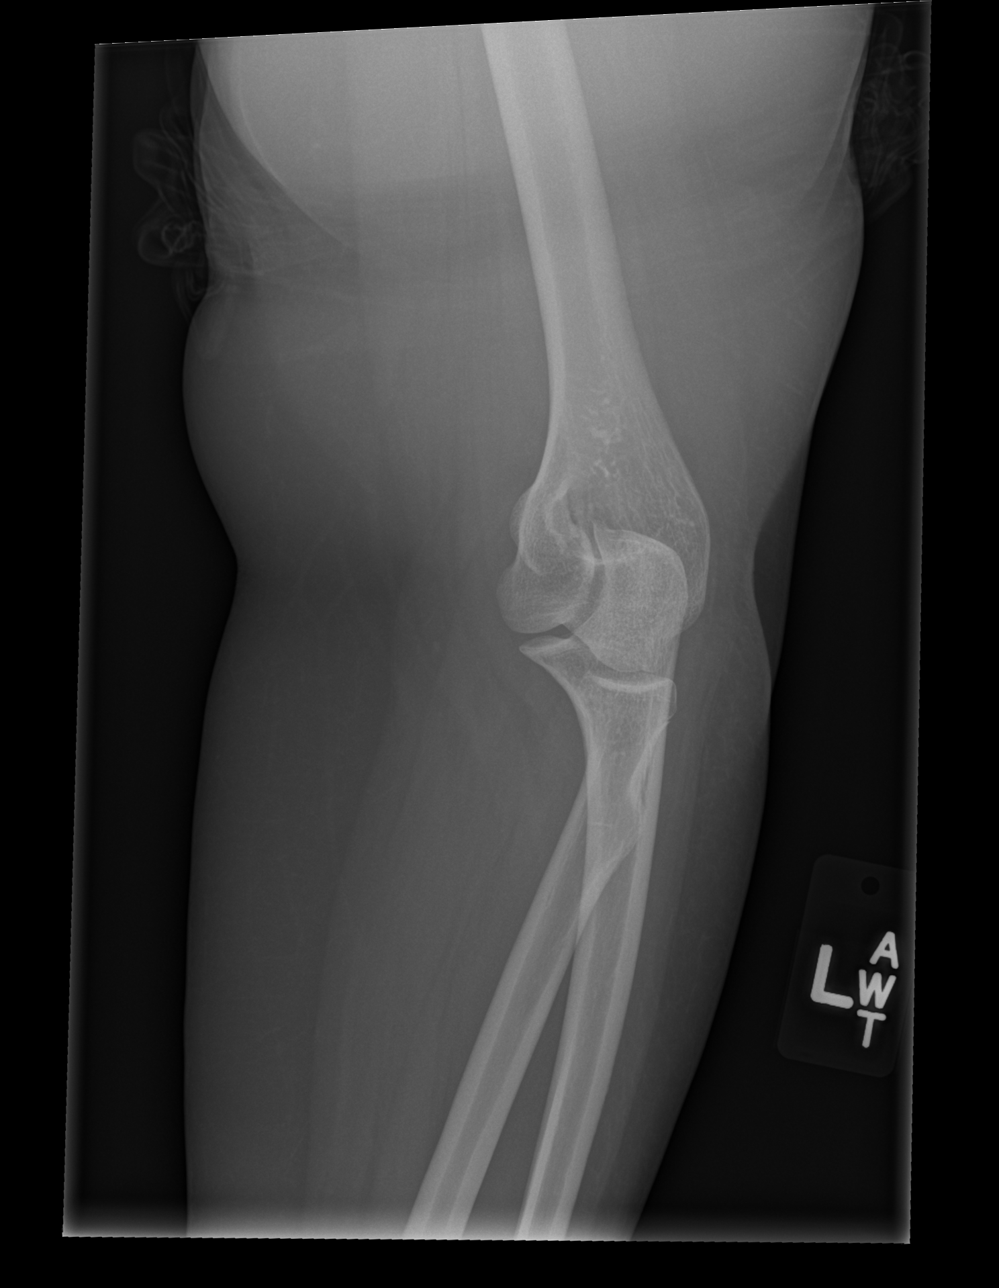

[x elbow lat left]
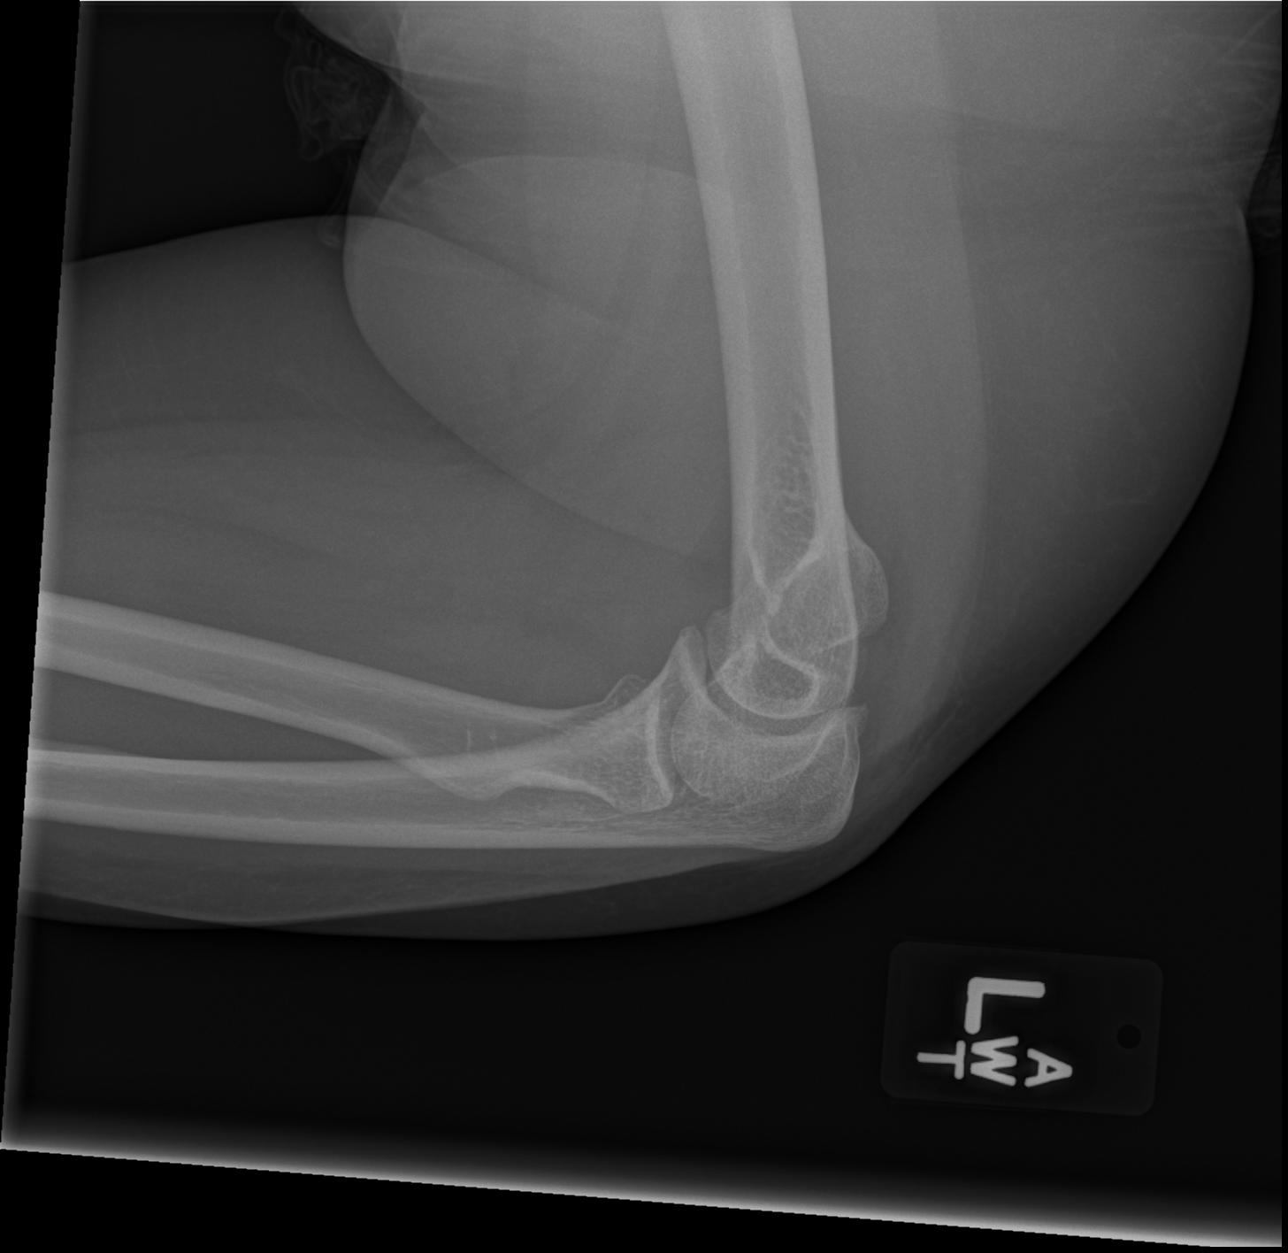

[4 of 4 positions shown; findings below may reference images not displayed]

FINDINGS: Elbow joint effusion is seen on the lateral projection. Cortical
irregularity involving the radial head is consistent with a
nondisplaced fracture. No other fractures identified. No evidence of
dislocation.
IMPRESSION: Nondisplaced radial head fracture and elbow joint effusion.

## 2016-07-06 IMAGING — CR DG ELBOW COMPLETE 3+V*R*
4 series · 4 of 4 positions shown · non-contrast
Comparison: None.

CLINICAL DATA: Right elbow fracture done at outside institution.
Tripped and fell today landing on both elbows.

EXAM:
RIGHT ELBOW - COMPLETE 3+ VIEW

[x elbow ap right]
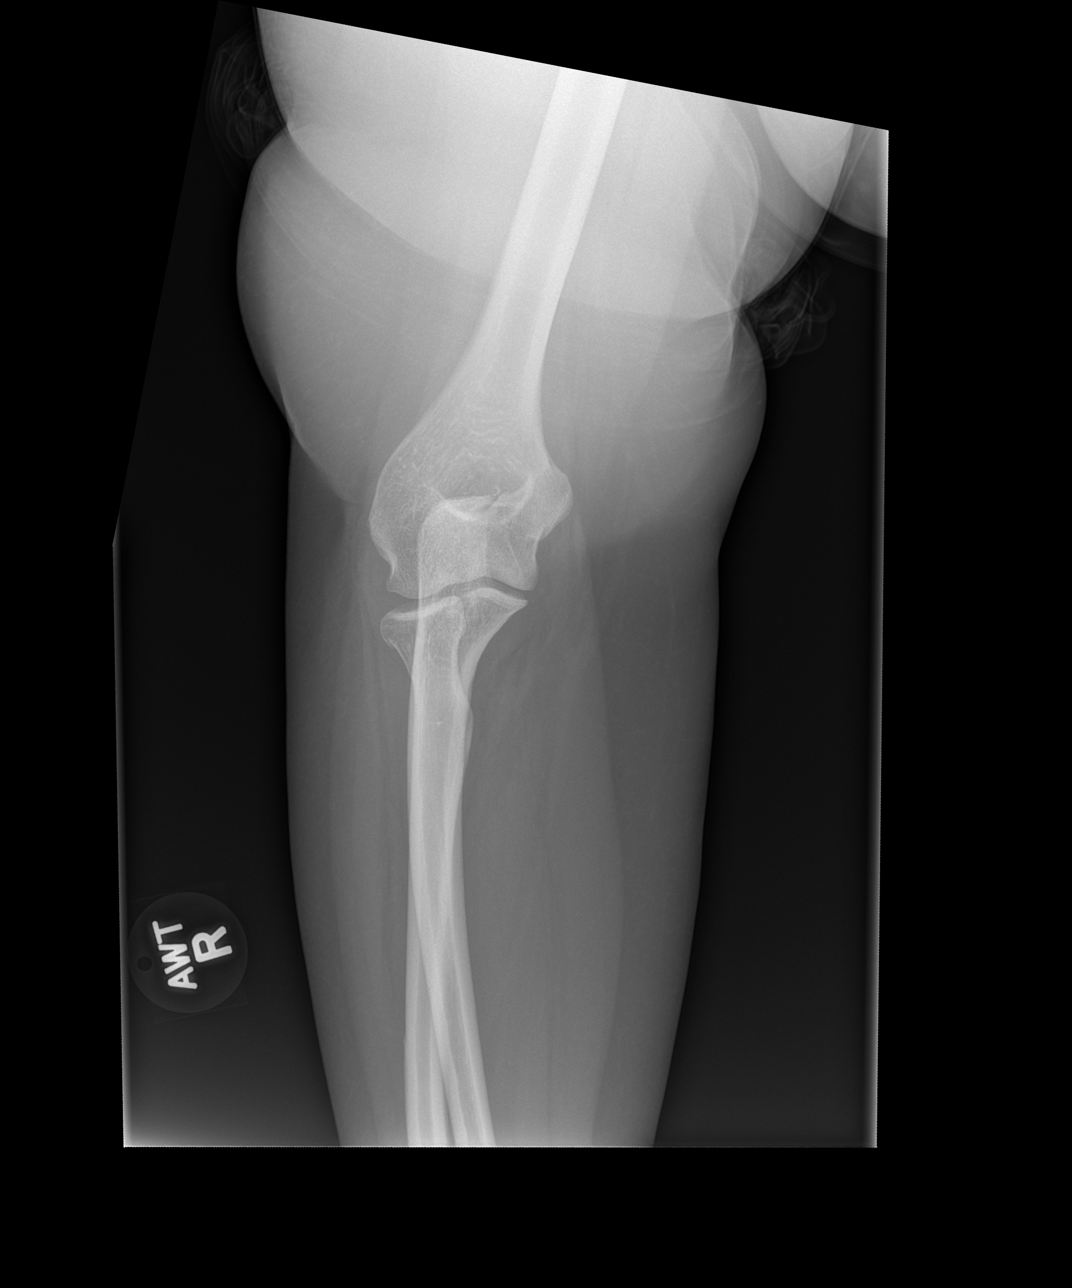

[x elbow obl right (1 of 2)]
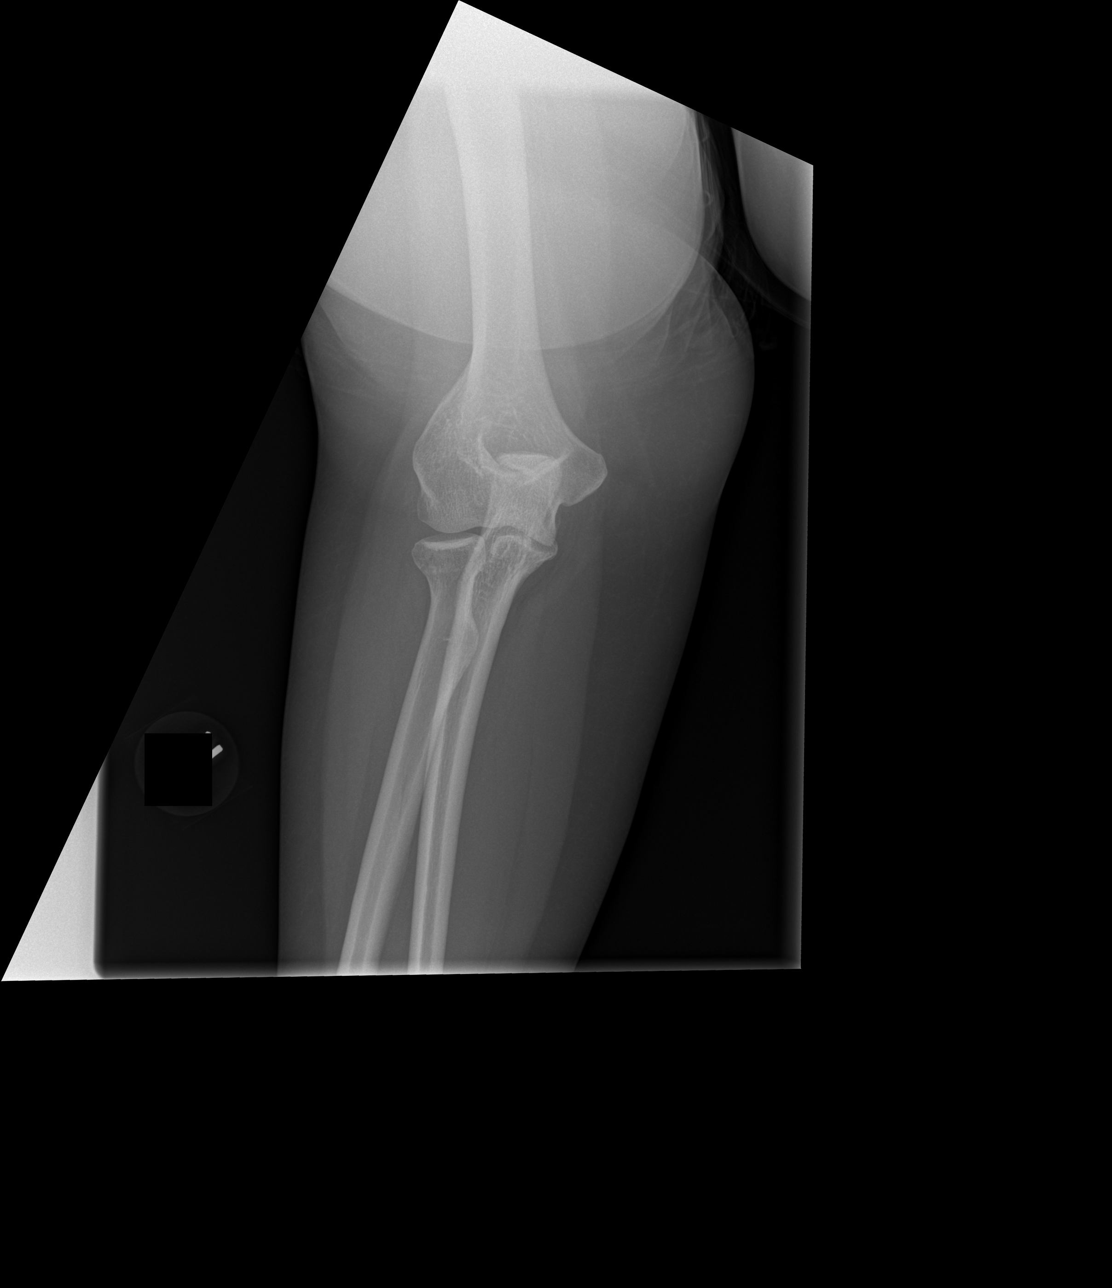

[x elbow obl right (2 of 2)]
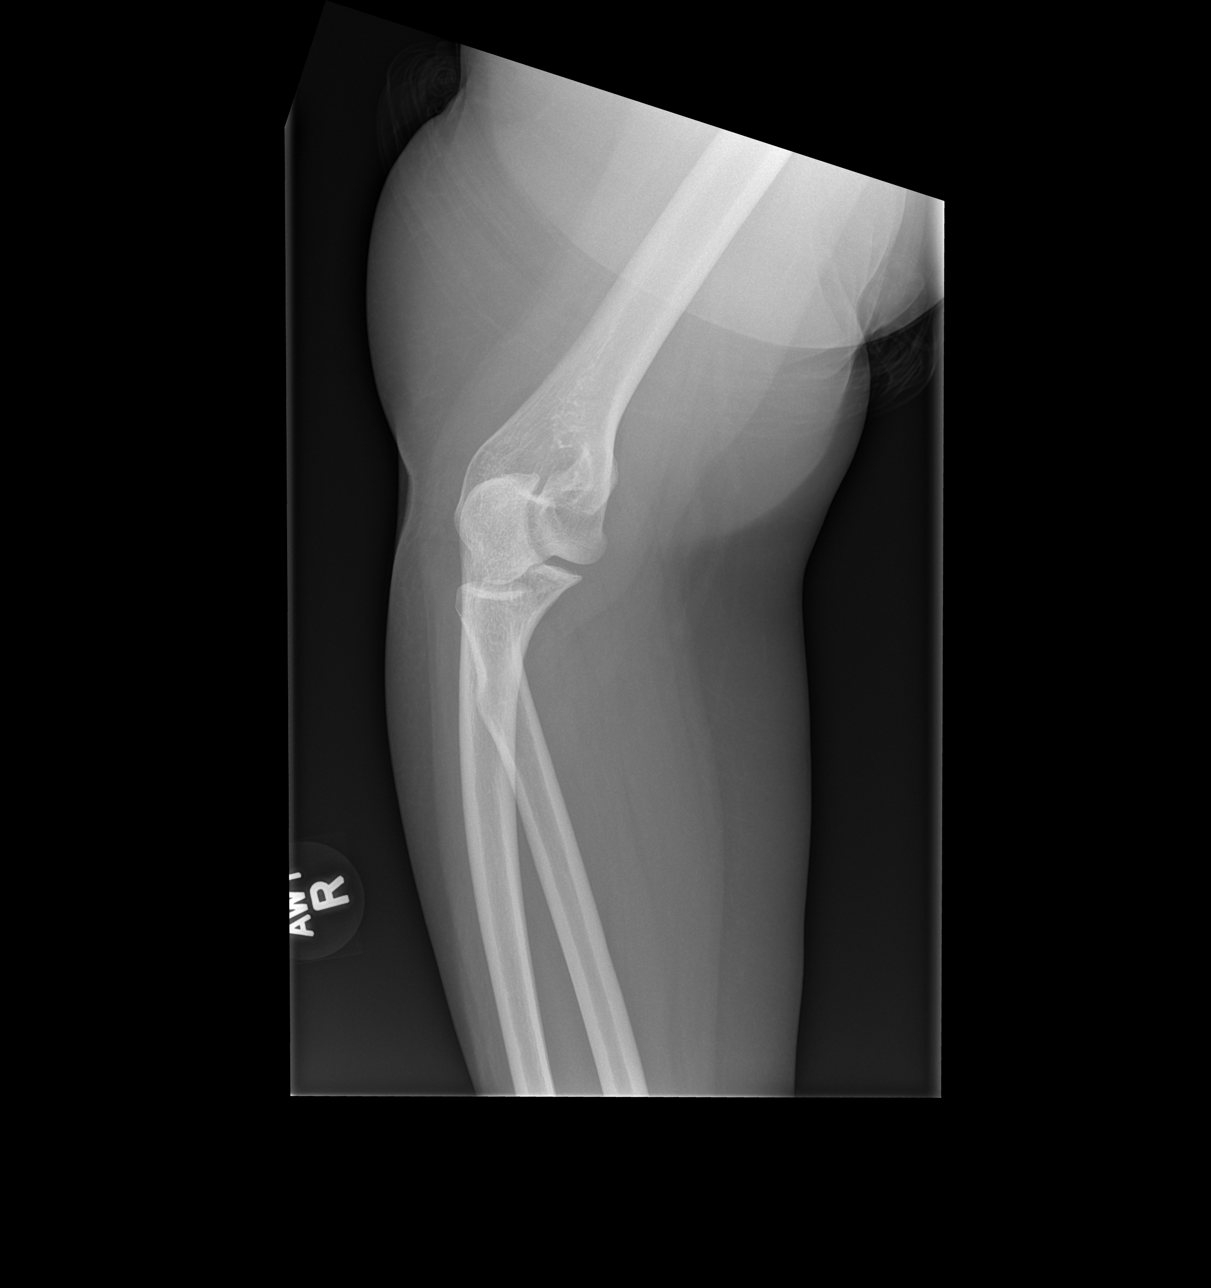

[x elbow lat right]
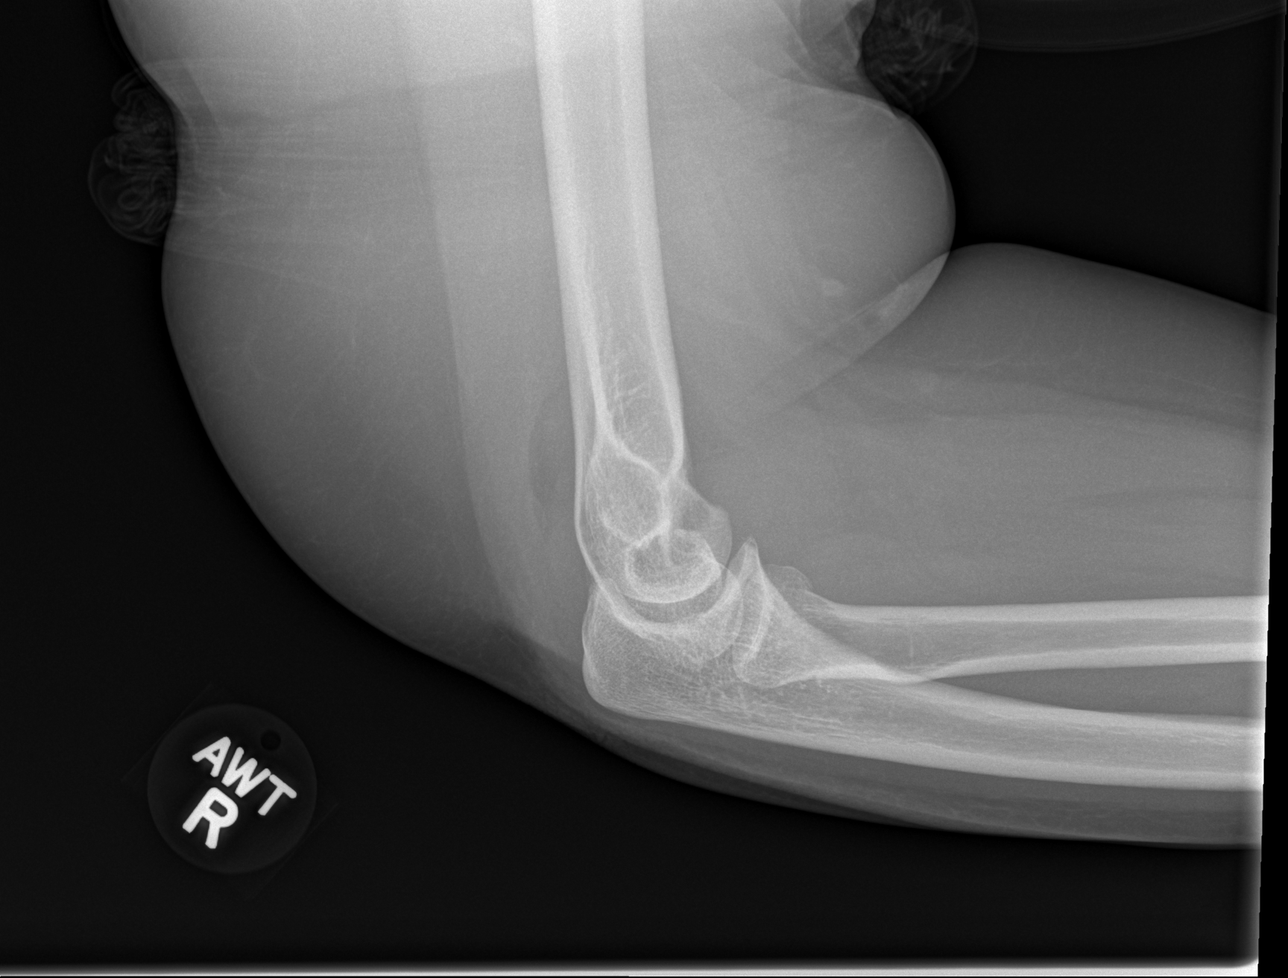

[4 of 4 positions shown; findings below may reference images not displayed]

FINDINGS: There is a right elbow joint effusion. There is a nondisplaced
radial head fracture. No additional fracture visualized. No
subluxation or dislocation.
IMPRESSION: Nondisplaced right radial head fracture. Right elbow joint effusion.

## 2016-07-09 ENCOUNTER — Ambulatory Visit (INDEPENDENT_AMBULATORY_CARE_PROVIDER_SITE_OTHER): Payer: BC Managed Care – PPO | Admitting: Physician Assistant

## 2016-07-09 ENCOUNTER — Ambulatory Visit: Payer: Self-pay | Admitting: Endocrinology

## 2016-07-09 ENCOUNTER — Encounter: Payer: Self-pay | Admitting: Physician Assistant

## 2016-07-09 VITALS — BP 124/68 | HR 98 | Temp 98.5°F | Resp 16 | Ht 68.0 in | Wt 268.0 lb

## 2016-07-09 DIAGNOSIS — R109 Unspecified abdominal pain: Secondary | ICD-10-CM | POA: Diagnosis not present

## 2016-07-09 LAB — URINALYSIS, MICROSCOPIC ONLY: RBC / HPF: NONE SEEN (ref 0–?)

## 2016-07-09 LAB — POCT URINALYSIS DIPSTICK
BILIRUBIN UA: NEGATIVE
Blood, UA: NEGATIVE
GLUCOSE UA: NEGATIVE
Ketones, UA: NEGATIVE
Leukocytes, UA: NEGATIVE
NITRITE UA: NEGATIVE
Protein, UA: NEGATIVE
Spec Grav, UA: >=1.03 — AB (ref 1.010–1.025)
UROBILINOGEN UA: 0.2 U/dL
pH, UA: 5 (ref 5.0–8.0)

## 2016-07-09 MED ORDER — CYCLOBENZAPRINE HCL 10 MG PO TABS: 10.0000 mg | ORAL_TABLET | Freq: Every day | ORAL | 0 refills | Status: DC

## 2016-07-09 NOTE — Progress Notes (Signed)
Pre visit review using our clinic review tool, if applicable. No additional management support is needed unless otherwise documented below in the visit note. 

## 2016-07-09 NOTE — Patient Instructions (Signed)
Your urine testing shows you need to hydrate more but does not show sign of infection. We are sending the urine for culture and to be looked at under a microscope.   If there are abnormal findings on the urine microalbumin this well help [place] narrow down causes of symptoms.  Stay hydrated with water. Take cranberry supplement. Continue pain medications as directed. I am adding a muscle relaxer to pain in muscles related to positioning and secondary to the physical therapy.   I encourage you to increase hydration and the amount of fiber in your diet.  Start a daily probiotic (Align, Culturelle, Digestive Advantage, etc.). If no bowel movement within 24 hours, take 2 Tbs of Milk of Magnesia in a 4 oz glass of warmed prune juice every 2-3 days to help promote bowel movement. If no results within 24 hours, then repeat above regimen, adding a Dulcolax stool softener to regimen. If this does not promote a bowel movement, please call the office.  If no note any new or worsening symptoms while workup is in process, call us or go to the ER for more acute assessment.

## 2016-07-09 NOTE — Progress Notes (Signed)
Patient presents to clinic today c/o 1 week of R low back and flank pain and nausea associated with some urinary hesitancy. Denies hematuria, dysuria, urinary frequency or urgency. Denies abdominal p ain. Patient with recent R knee arthroplasty on 06/26/16 with Dr. Redmond Pulling. Since that time notes taking her narcotic pain medication as directed for joint pain relief. Has noted constipation without bloody stool. Last bowel movement was this morning. Notes good bowel emptying and feeling better. Denies history of kidney stone. Wants to make sure she is not developing a UTI.   Past Medical History:  Diagnosis Date  . Acute appendicitis 09/09/2012  . Anxiety   . Asthma   . Back pain    Occ. issues wih back pain  . Back pain 09/09/2012   It is intermittent now,  previously source of fibromyalgia dx.    . Fibromyalgia   . Headache   . Hypothyroid 09/09/2012  . Hypothyroidism   . Osteoarthritis   . Precancerous changes of the cervix   . Sleep apnea    pre lap banding-never used cpap or mask  . Vitamin D deficiency     Current Outpatient Prescriptions on File Prior to Visit  Medication Sig Dispense Refill  . clonazePAM (KLONOPIN) 0.5 MG tablet Take 1 tablet (0.5 mg total) by mouth 2 (two) times daily as needed for anxiety. 30 tablet 1  . levothyroxine (SYNTHROID) 175 MCG tablet Take 1 tablet (175 mcg total) by mouth daily before breakfast. 30 tablet 3  . loratadine (CLARITIN) 10 MG tablet Take 10 mg by mouth daily as needed for allergies.    Marland Kitchen oxyCODONE (ROXICODONE) 5 MG immediate release tablet Take 1 tablet (5 mg total) by mouth every 4 (four) hours as needed for moderate pain or severe pain. 30 tablet 0  . ranitidine (ZANTAC) 150 MG tablet Take 1 tablet (150 mg total) by mouth 2 (two) times daily. 60 tablet 0  . traMADol (ULTRAM) 50 MG tablet Take 1-2 tablets (50-100 mg total) by mouth every 6 (six) hours as needed for moderate pain. 40 tablet 0  . venlafaxine XR (EFFEXOR-XR) 75 MG 24 hr capsule  Take 1 capsule (75 mg total) by mouth daily with breakfast. 30 capsule 3  . Vitamin D, Ergocalciferol, (DRISDOL) 50000 units CAPS capsule TAKE ONE CAPSULE EVERY 7 DAYS 12 capsule 0   No current facility-administered medications on file prior to visit.     Allergies  Allergen Reactions  . Paroxetine Hcl Rash  . Percocet [Oxycodone-Acetaminophen] Itching  . Codeine Other (See Comments)    Puts patient to sleep "knocks her out" for at least two days, regardless of dosage.    Family History  Problem Relation Age of Onset  . Hypertension Maternal Grandfather   . Diabetes Paternal Grandmother   . Anesthesia problems Neg Hx     Social History   Social History  . Marital status: Married    Spouse name: N/A  . Number of children: N/A  . Years of education: N/A   Social History Main Topics  . Smoking status: Current Every Day Smoker    Packs/day: 1.00    Years: 20.00    Types: Cigarettes  . Smokeless tobacco: Never Used  . Alcohol use Yes     Comment: occ  . Drug use: No  . Sexual activity: Yes    Partners: Male    Birth control/ protection: Surgical     Comment: TLH   Other Topics Concern  . None  Social History Narrative  . None   Review of Systems - See HPI.  All other ROS are negative.  BP 124/68   Pulse 98   Temp 98.5 F (36.9 C) (Oral)   Resp 16   Ht 5\' 8"  (1.727 m)   Wt 268 lb (121.6 kg)   LMP 10/26/2015 (Exact Date)   SpO2 98%   BMI 40.75 kg/m   Physical Exam  Constitutional: She is oriented to person, place, and time and well-developed, well-nourished, and in no distress.  HENT:  Head: Normocephalic and atraumatic.  Eyes: Conjunctivae are normal.  Neck: Neck supple.  Cardiovascular: Normal rate, regular rhythm, normal heart sounds and intact distal pulses.   Pulmonary/Chest: Effort normal and breath sounds normal. No respiratory distress. She has no wheezes. She has no rales. She exhibits no tenderness.  Abdominal: Soft. Bowel sounds are normal.  She exhibits no distension and no mass. There is no tenderness. There is no rebound and no guarding.  Negative CVA tenderness.  Musculoskeletal:       Back:  Lymphadenopathy:    She has no cervical adenopathy.  Neurological: She is alert and oriented to person, place, and time.  Skin: Skin is warm and dry. No rash noted.  Nursing note and vitals reviewed.   Recent Results (from the past 2160 hour(s))  TSH     Status: Abnormal   Collection Time: 05/09/16  1:16 PM  Result Value Ref Range   TSH 0.31 (L) 0.35 - 4.50 uIU/mL  T4, free     Status: None   Collection Time: 05/09/16  1:16 PM  Result Value Ref Range   Free T4 0.78 0.60 - 1.60 ng/dL    Comment: Specimens from patients who are undergoing biotin therapy and /or ingesting biotin supplements may contain high levels of biotin.  The higher biotin concentration in these specimens interferes with this Free T4 assay.  Specimens that contain high levels  of biotin may cause false high results for this Free T4 assay.  Please interpret results in light of the total clinical presentation of the patient.    T3, free     Status: None   Collection Time: 05/09/16  1:16 PM  Result Value Ref Range   T3, Free 2.8 2.3 - 4.2 pg/mL  POCT Urinalysis Dipstick     Status: Abnormal   Collection Time: 07/09/16 11:22 AM  Result Value Ref Range   Color, UA amber    Clarity, UA clear    Glucose, UA negative    Bilirubin, UA negative    Ketones, UA negative    Spec Grav, UA >=1.030 (A) 1.010 - 1.025   Blood, UA negative    pH, UA 5.0 5.0 - 8.0   Protein, UA negative    Urobilinogen, UA 0.2 0.2 or 1.0 E.U./dL   Nitrite, UA negative    Leukocytes, UA Negative Negative  Urine Microscopic Only     Status: Abnormal   Collection Time: 07/09/16 12:50 PM  Result Value Ref Range   WBC, UA 0-2/hpf 0-2/hpf   RBC / HPF none seen 0-2/hpf   Mucus, UA Presence of (A) None   Squamous Epithelial / LPF Few(5-10/hpf) (A) Rare(0-4/hpf)   Assessment/Plan: 1.  Flank pain Unclear etiology. There is Muscular tenderness most likely secondary to position changes from knee surgery. Continue pain medication. Add on Flexeril at night short-term. UA shows need for increased hydration. No other abnormalities. Will send for culture and micro. Supportive measures and OTC medications reviewed.  Will alter regimen based on results.   - POCT Urinalysis Dipstick - Basic metabolic panel - Urine Culture - Urine Microscopic Only   Leeanne Rio, PA-C

## 2016-07-10 LAB — URINE CULTURE

## 2016-07-15 ENCOUNTER — Ambulatory Visit: Payer: BC Managed Care – PPO | Admitting: Obstetrics & Gynecology

## 2016-07-24 ENCOUNTER — Telehealth: Payer: Self-pay | Admitting: Endocrinology

## 2016-07-24 NOTE — Telephone Encounter (Signed)
Please advise if okay to refill. I do not see this on her current medication list.

## 2016-07-25 NOTE — Telephone Encounter (Signed)
This has been prescribed before, she is supposed to be taking 2 tablets at breakfast and one at lunch

## 2016-07-25 NOTE — Telephone Encounter (Signed)
I have approved this medication and sent in a refill for the dosage you prescribed.

## 2016-08-11 ENCOUNTER — Other Ambulatory Visit: Payer: Self-pay

## 2016-08-12 ENCOUNTER — Other Ambulatory Visit (INDEPENDENT_AMBULATORY_CARE_PROVIDER_SITE_OTHER): Payer: BC Managed Care – PPO

## 2016-08-12 DIAGNOSIS — E063 Autoimmune thyroiditis: Secondary | ICD-10-CM

## 2016-08-12 LAB — T3, FREE: T3, Free: 4.1 pg/mL (ref 2.3–4.2)

## 2016-08-12 LAB — TSH: TSH: 0.05 u[IU]/mL — ABNORMAL LOW (ref 0.35–4.50)

## 2016-08-12 LAB — T4, FREE: Free T4: 1.54 ng/dL (ref 0.60–1.60)

## 2016-08-14 ENCOUNTER — Ambulatory Visit (INDEPENDENT_AMBULATORY_CARE_PROVIDER_SITE_OTHER): Payer: BC Managed Care – PPO | Admitting: Endocrinology

## 2016-08-14 ENCOUNTER — Encounter: Payer: Self-pay | Admitting: Endocrinology

## 2016-08-14 VITALS — BP 130/86 | HR 89 | Ht 68.0 in | Wt 261.8 lb

## 2016-08-14 DIAGNOSIS — E063 Autoimmune thyroiditis: Secondary | ICD-10-CM

## 2016-08-14 MED ORDER — LEVOTHYROXINE SODIUM 125 MCG PO TABS: 125.0000 ug | ORAL_TABLET | Freq: Every day | ORAL | 3 refills | Status: DC

## 2016-08-14 NOTE — Progress Notes (Signed)
Patient ID: Sally [name], female   DOB: 26-May-1975, 41 y.o.   MRN: [id number]             Referring Physician: Birdie Riddle  Reason for Appointment:  Hypothyroidism, follow-up visit    History of Present Illness:   Hypothyroidism was first diagnosed in 1998 about 4 months after her pregnancy  At the time of diagnosis patient was having symptoms of  fatigue, depression, weight gain but does not remember other symptoms  With starting thyroid supplementation the patient's symptoms  improved  However she apparently she had needed progressively higher doses of thyroid supplementation For several years she was taking 300 g of levothyroxine had felt fairly good This is despite review of her labs since 2011 indicating that most of her TSH levels have been low except twice In 2016 her TSH was 37.8 when she was out of her medication  On her initial consultation she was complaining about difficulty with weight loss as well as symptoms of depression, fatigue, lethargy and difficulty focusing and concentrating.  She has also had some dry skin but no hair loss, has some constipation     This was despite her TSH level being low normal or low With progressively reducing her levothyroxine dose her TSH was still low as of 2/18  RECENT history:  She was empirically started on Cytomel in addition to a lower dose of levothyroxine to see if she would subjectively feel better even though her free T3 level was excellent at 3.8 Initially she felt somewhat better with her energy level and had less depressed feelings also However since her follow-up free T3 was 2.8 she was told to add another 5 g of Cytomel at lunch  Currently the patient cannot tell if she has improved energy level are not since she just had knee surgery and is also having some difficulty sleeping She also has had some more depression Her weight is down She has otherwise no symptoms of shakiness or palpitations         Patient's weight  history is as follows:   Wt Readings from Last 3 Encounters:  08/14/16 261 lb 12.8 oz (118.8 kg)  07/09/16 268 lb (121.6 kg)  05/14/16 269 lb (122 kg)    THYROID functions done recently indicate her TSH is low along with upper normal free T4 and T3 levels now   Thyroid function results have been as follows:  Lab Results  Component Value Date   FREET4 1.54 08/12/2016   FREET4 0.78 05/09/2016   FREET4 1.57 03/06/2016   TSH 0.05 (L) 08/12/2016   TSH 0.31 (L) 05/09/2016   TSH 0.04 (L) 03/06/2016    Lab Results  Component Value Date   T3FREE 4.1 08/12/2016   T3FREE 2.8 05/09/2016   T3FREE 3.8 03/06/2016    Lab Results  Component Value Date   TSH 0.05 (L) 08/12/2016   TSH 0.31 (L) 05/09/2016   TSH 0.04 (L) 03/06/2016   TSH 0.04 (L) 01/28/2016   TSH 0.03 (L) 01/07/2016   TSH 0.59 04/03/2015   TSH 37.809 (H) 11/03/2014   TSH 0.014 (L) 07/27/2014   TSH 0.720 09/04/2013     Past Medical History:  Diagnosis Date  . Acute appendicitis 09/09/2012  . Anxiety   . Asthma   . Back pain    Occ. issues wih back pain  . Back pain 09/09/2012   It is intermittent now,  previously source of fibromyalgia dx.    . Fibromyalgia   .  Headache   . Hypothyroid 09/09/2012  . Hypothyroidism   . Osteoarthritis   . Precancerous changes of the cervix   . Sleep apnea    pre lap banding-never used cpap or mask  . Vitamin D deficiency     Past Surgical History:  Procedure Laterality Date  . APPENDECTOMY    . BILATERAL SALPINGECTOMY Bilateral 11/05/2015   Procedure: BILATERAL SALPINGECTOMY;  Surgeon: [name], MD;  Location: [facility];  Service: Gynecology;  Laterality: Bilateral;  . BRAIN SURGERY  1992   remove blood clot after a fall  . CESAREAN SECTION  1997, 2013  . CYSTOSCOPY N/A 11/05/2015   Procedure: CYSTOSCOPY;  Surgeon: [name], MD;  Location: [facility];  Service: Gynecology;  Laterality: N/A;  . GASTRIC BANDING PORT REVISION  01/20/2012   Procedure: GASTRIC BANDING  PORT REVISION;  Surgeon: Pedro Earls, MD;  Location: [facility];  Service: General;  Laterality: N/A;  . KNEE ARTHROSCOPY WITH DRILLING/MICROFRACTURE Right 12/06/2014   Procedure: KNEE ARTHROSCOPY WITH DRILLING/MICROFRACTURE, CHONDROPLASTY, EXCISION OF PLICA;  Surgeon: Dorna Leitz, MD;  Location: [facility];  Service: Orthopedics;  Laterality: Right;  . KNEE SURGERY    . LAPAROSCOPIC APPENDECTOMY N/A 09/09/2012   Procedure: APPENDECTOMY LAPAROSCOPIC;  Surgeon: Merrie Roof, MD;  Location: [facility];  Service: General;  Laterality: N/A;  . [place]  . LAPAROSCOPIC HYSTERECTOMY N/A 11/05/2015   Procedure: HYSTERECTOMY TOTAL LAPAROSCOPIC;  Surgeon: [name], MD;  Location: [facility];  Service: Gynecology;  Laterality: N/A;  . LAPAROSCOPY  08/19/2010   Procedure: LAPAROSCOPY OPERATIVE;  Surgeon: Felipa Emory;  Location: [facility];  Service: Gynecology;  Laterality: N/A;  with Biopsy of uterine serosa  . TUBAL LIGATION     8/13    Family History  Problem Relation Age of Onset  . Hypertension Maternal Grandfather   . Diabetes Paternal Grandmother   . Anesthesia problems Neg Hx     Social History:  reports that she has been smoking Cigarettes.  She has a 20.00 pack-year smoking history. She has never used smokeless tobacco. She reports that she drinks alcohol. She reports that she does not use drugs.  Allergies:  Allergies  Allergen Reactions  . Paroxetine Hcl Rash  . Percocet [Oxycodone-Acetaminophen] Itching  . Codeine Other (See Comments)    Puts patient to sleep "knocks her out" for at least two days, regardless of dosage.    Allergies as of 08/14/2016      Reactions   Paroxetine Hcl Rash   Percocet [oxycodone-acetaminophen] Itching   Codeine Other (See Comments)   Puts patient to sleep "knocks her out" for at least two days, regardless of dosage.      Medication List       Accurate as of 08/14/16  2:04 PM. Always use your most recent  med list.          acetaminophen 500 MG tablet Commonly known as:  TYLENOL Take 1,000 mg by mouth every 6 (six) hours as needed.   clonazePAM 0.5 MG tablet Commonly known as:  KLONOPIN Take 1 tablet (0.5 mg total) by mouth 2 (two) times daily as needed for anxiety.   gabapentin 100 MG capsule Commonly known as:  NEURONTIN Take 1 tab twice a day and 3 tabs at bedtime   HYDROmorphone 2 MG tablet Commonly known as:  DILAUDID TAKE 1 TO 1   1 2  TABLETS BY MOUTH EVERY 4 HOURS AS NEEDED FOR MODERATE  TO SEVERE PAIN   levothyroxine 175 MCG tablet Commonly known as:  SYNTHROID Take 1 tablet (175 mcg total) by mouth daily before breakfast.   liothyronine 5 MCG tablet Commonly known as:  CYTOMEL Take 3 tablets (15 mcg total) by mouth daily. Take 2 tablets at breakfast and take one tablet at lunch   loratadine 10 MG tablet Commonly known as:  CLARITIN Take 10 mg by mouth daily as needed for allergies.   oxyCODONE 5 MG immediate release tablet Commonly known as:  ROXICODONE Take 1 tablet (5 mg total) by mouth every 4 (four) hours as needed for moderate pain or severe pain.   ranitidine 150 MG tablet Commonly known as:  ZANTAC Take 1 tablet (150 mg total) by mouth 2 (two) times daily.   senna-docusate 8.6-50 MG tablet Commonly known as:  Senokot-S Take by mouth as needed.   traMADol 50 MG tablet Commonly known as:  ULTRAM Take 1-2 tablets (50-100 mg total) by mouth every 6 (six) hours as needed for moderate pain.   venlafaxine XR 75 MG 24 hr capsule Commonly known as:  EFFEXOR-XR Take 1 capsule (75 mg total) by mouth daily with breakfast.   Vitamin D (Ergocalciferol) 50000 units Caps capsule Commonly known as:  DRISDOL TAKE ONE CAPSULE EVERY 7 DAYS          Review of Systems  Psychiatric/Behavioral: Positive for insomnia.                Examination:    BP 130/86   Pulse 89   Ht 5\' 8"  (1.727 m)   Wt 261 lb 12.8 oz (118.8 kg)   LMP 10/26/2015 (Exact Date)    SpO2 97%   BMI 39.81 kg/m   Thyroid not palpable. 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  Currently with taking levothyroxine 175 g and Cytomel 15 g daily in the morning she has had some improvement in her fatigue However because of her knee surgery since her last visit difficult to assess her subjective improvement She is also having issues with depression now She does not have any other signs or symptoms indicating hypothyroidism or hyperthyroidism  Her weight is probably improved because of having surgery recently  With adding additional 5 g of Cytomel her TSH is now low again and both free T4 and free T3 levels are high normal   PLAN:   She will reduce her dose of Synthroid down to 125 g, currently getting about the equivalent of 161 g of levothyroxine daily Meanwhile she'll continue with total of 15 g of Cytomel  We will have her try taking additional 5 g of liothyronine at lunchtime, this will give her her equivalent of another 20 g of levothyroxine daily  She will discuss any symptoms of depression with her PCP   Follow-up in 8 weeks   Wrenn Willcox 08/14/2016, 2:04 PM      Note: This office note was prepared with Estate agent. Any transcriptional errors that result from this process are unintentional.

## 2016-08-16 ENCOUNTER — Other Ambulatory Visit: Payer: Self-pay | Admitting: Family Medicine

## 2016-08-18 NOTE — Telephone Encounter (Signed)
Last OV 03/06/16 clonazepam last filled 01/07/16 #30 with 1

## 2016-08-18 NOTE — Telephone Encounter (Signed)
Medication filled to pharmacy as requested.   

## 2016-08-20 DIAGNOSIS — Z96651 Presence of right artificial knee joint: Secondary | ICD-10-CM | POA: Insufficient documentation

## 2016-08-21 ENCOUNTER — Encounter: Payer: Self-pay | Admitting: Obstetrics & Gynecology

## 2016-08-21 ENCOUNTER — Ambulatory Visit (INDEPENDENT_AMBULATORY_CARE_PROVIDER_SITE_OTHER): Payer: BC Managed Care – PPO | Admitting: Obstetrics & Gynecology

## 2016-08-21 ENCOUNTER — Other Ambulatory Visit: Payer: Self-pay | Admitting: Obstetrics & Gynecology

## 2016-08-21 VITALS — BP 130/86 | HR 66 | Resp 16 | Ht 68.0 in | Wt 260.0 lb

## 2016-08-21 DIAGNOSIS — Z01419 Encounter for gynecological examination (general) (routine) without abnormal findings: Secondary | ICD-10-CM

## 2016-08-21 DIAGNOSIS — N6310 Unspecified lump in the right breast, unspecified quadrant: Secondary | ICD-10-CM

## 2016-08-21 DIAGNOSIS — N6002 Solitary cyst of left breast: Secondary | ICD-10-CM

## 2016-08-21 MED ORDER — VITAMIN D (ERGOCALCIFEROL) 1.25 MG (50000 UNIT) PO CAPS: ORAL_CAPSULE | ORAL | 4 refills | Status: DC

## 2016-08-21 NOTE — Progress Notes (Signed)
41 y.o. [id number] Married Caucasian F here for annual exam.  Had knee surgery with Dr. Redmond Pulling in [place] knee replacement.  Still doing PT.  Is being referred to pain management for continued need for pain medication.  This resulted from work injury.  Was fired from job due to missed work.  Had been there 20 years.  Very frustrated and sad about this.  Did seek out an attorney but has been told the reason and the way she was terminated was, in fact, legal.  Not sure about disability yet as still in recovery from surgery.  Denies vaginal bleeding.  Pelvic pain is much better.  Had diagnostic imaging in January.  Is overdue for follow-up.  This needs to be scheduled for her if possible.  She requests help with this as will lose insurance at the end of the month and needs to have everything done possible before that time.     Patient's last menstrual period was 10/26/2015 (exact date).          Sexually active: Yes.    The current method of family planning is status post hysterectomy.    Exercising: No.  The patient does not participate in regular exercise at present. Smoker:  yes  Health Maintenance: Pap:  04/03/15 negative, 04/27/14 negative, 06/18/12 negative, HR HPV negative  History of abnormal Pap:  yes MMG:  02/18/16 BIRADS 3 probably benign- follow up 6 months Colonoscopy:  Years ago- normal per patient  BMD:   Years ago- normal per patient  TDaP:  08/20/15  Pneumonia vaccine(s):  Done through work Zostavax:   never Hep C testing: not indicated  Screening Labs: PCP, Hb today: PCP   reports that she has been smoking Cigarettes.  She has a 20.00 pack-year smoking history. She has never used smokeless tobacco. She reports that she drinks alcohol. She reports that she does not use drugs.  Past Medical History:  Diagnosis Date  . Acute appendicitis 09/09/2012  . Anxiety   . Asthma   . Back pain    Occ. issues wih back pain  . Back pain 09/09/2012   It is intermittent now,  previously  source of fibromyalgia dx.    . Fibromyalgia   . Headache   . Hypothyroid 09/09/2012  . Hypothyroidism   . Osteoarthritis   . Precancerous changes of the cervix   . Sleep apnea    pre lap banding-never used cpap or mask  . Vitamin D deficiency     Past Surgical History:  Procedure Laterality Date  . APPENDECTOMY    . BILATERAL SALPINGECTOMY Bilateral 11/05/2015   Procedure: BILATERAL SALPINGECTOMY;  Surgeon: [name], MD;  Location: [facility];  Service: Gynecology;  Laterality: Bilateral;  . BRAIN SURGERY  1992   remove blood clot after a fall  . CESAREAN SECTION  1997, 2013  . CYSTOSCOPY N/A 11/05/2015   Procedure: CYSTOSCOPY;  Surgeon: [name], MD;  Location: [facility];  Service: Gynecology;  Laterality: N/A;  . GASTRIC BANDING PORT REVISION  01/20/2012   Procedure: GASTRIC BANDING PORT REVISION;  Surgeon: Pedro Earls, MD;  Location: [facility];  Service: General;  Laterality: N/A;  . KNEE ARTHROSCOPY WITH DRILLING/MICROFRACTURE Right 12/06/2014   Procedure: KNEE ARTHROSCOPY WITH DRILLING/MICROFRACTURE, CHONDROPLASTY, EXCISION OF PLICA;  Surgeon: Dorna Leitz, MD;  Location: [facility];  Service: Orthopedics;  Laterality: Right;  . KNEE SURGERY    . KNEE SURGERY  06/26/2016   [facility]  .  LAPAROSCOPIC APPENDECTOMY N/A 09/09/2012   Procedure: APPENDECTOMY LAPAROSCOPIC;  Surgeon: Merrie Roof, MD;  Location: [facility];  Service: General;  Laterality: N/A;  . [place]  . LAPAROSCOPIC HYSTERECTOMY N/A 11/05/2015   Procedure: HYSTERECTOMY TOTAL LAPAROSCOPIC;  Surgeon: [name], MD;  Location: [facility];  Service: Gynecology;  Laterality: N/A;  . LAPAROSCOPY  08/19/2010   Procedure: LAPAROSCOPY OPERATIVE;  Surgeon: Felipa Emory;  Location: [facility];  Service: Gynecology;  Laterality: N/A;  with Biopsy of uterine serosa  . TUBAL LIGATION     8/13    Current Outpatient Prescriptions  Medication Sig Dispense Refill  .  acetaminophen (TYLENOL) 500 MG tablet Take 1,000 mg by mouth every 6 (six) hours as needed.    . ASPIRIN PO Take 325 mg by mouth.    . clonazePAM (KLONOPIN) 0.5 MG tablet TAKE 1 TABLET TWICE A DAY AS NEEDED FOR ANXIETY 30 tablet 1  . gabapentin (NEURONTIN) 100 MG capsule Take 1 tab twice a day and 3 tabs at bedtime    . levothyroxine (SYNTHROID, LEVOTHROID) 125 MCG tablet Take 1 tablet (125 mcg total) by mouth daily. 30 tablet 3  . liothyronine (CYTOMEL) 5 MCG tablet Take 3 tablets (15 mcg total) by mouth daily. Take 2 tablets at breakfast and take one tablet at lunch 90 tablet 2  . loratadine (CLARITIN) 10 MG tablet Take 10 mg by mouth daily as needed for allergies.    Marland Kitchen oxyCODONE (ROXICODONE) 5 MG immediate release tablet Take 1 tablet (5 mg total) by mouth every 4 (four) hours as needed for moderate pain or severe pain. 30 tablet 0  . ranitidine (ZANTAC) 150 MG tablet Take 1 tablet (150 mg total) by mouth 2 (two) times daily. 60 tablet 0  . senna-docusate (SENOKOT-S) 8.6-50 MG tablet Take by mouth as needed.    . traMADol (ULTRAM) 50 MG tablet Take 1-2 tablets (50-100 mg total) by mouth every 6 (six) hours as needed for moderate pain. 40 tablet 0  . venlafaxine XR (EFFEXOR-XR) 75 MG 24 hr capsule Take 1 capsule (75 mg total) by mouth daily with breakfast. 30 capsule 3  . Vitamin D, Ergocalciferol, (DRISDOL) 50000 units CAPS capsule TAKE ONE CAPSULE EVERY 7 DAYS 12 capsule 0   No current facility-administered medications for this visit.     Family History  Problem Relation Age of Onset  . Hypertension Maternal Grandfather   . Diabetes Paternal Grandmother   . Anesthesia problems Neg Hx     ROS:  Pertinent items are noted in HPI.  Otherwise, a comprehensive ROS was negative.  Exam:   BP 130/86 (BP Location: Right Arm, Patient Position: Sitting, Cuff Size: Large)   Pulse 66   Resp 16   Ht 5\' 8"  (1.727 m)   Wt 260 lb (117.9 kg)   LMP 10/26/2015 (Exact Date)   BMI 39.53 kg/m   Weight  change: -6#  Height: 5\' 8"  (172.7 cm)  Ht Readings from Last 3 Encounters:  08/21/16 5\' 8"  (1.727 m)  08/14/16 5\' 8"  (1.727 m)  07/09/16 5\' 8"  (1.727 m)    General appearance: alert, cooperative and appears stated age Head: Normocephalic, without obvious abnormality, atraumatic Neck: no adenopathy, supple, symmetrical, trachea midline and thyroid normal to inspection and palpation Lungs: clear to auscultation bilaterally Breasts: Left breast mass at 9 o'clock, tender and mobile (consistent with cyst), no skin changes, no nipple discharge, no LAD; right breast with small mass at 4 o'clock,  tender to palpation, no skin changes, nipple discharge or LAD Heart: regular rate and rhythm Abdomen: soft, non-tender; bowel sounds normal; no masses,  no organomegaly Extremities: extremities normal, atraumatic, no cyanosis or edema Skin: Skin color, texture, turgor normal. No rashes or lesions Lymph nodes: Cervical, supraclavicular, and axillary nodes normal. No abnormal inguinal nodes palpated Neurologic: Grossly normal   Pelvic: External genitalia:  no lesions              Urethra:  normal appearing urethra with no masses, tenderness or lesions              Bartholins and Skenes: normal                 Vagina: normal appearing vagina with normal color and discharge, no lesions              Cervix: absent              Pap taken: No. Bimanual Exam:  Uterus:  uterus absent              Adnexa: no mass, fullness, tenderness               Rectovaginal: Confirms               Anus:  normal sphincter tone, no lesions  Chaperone was present for exam.  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  #12/4RF to pharmacy Lab work UTD AEX 1 year or follow-up PRN return annually or prn

## 2016-08-21 NOTE — Progress Notes (Signed)
Scheduled Diagnostic Bil.MMG 08-26-16 1:40 at [facility].

## 2016-08-24 ENCOUNTER — Other Ambulatory Visit: Payer: Self-pay | Admitting: Endocrinology

## 2016-08-26 ENCOUNTER — Ambulatory Visit
Admission: RE | Admit: 2016-08-26 | Discharge: 2016-08-26 | Disposition: A | Discharge status: Home / Self Care | Payer: BC Managed Care – PPO | Source: Ambulatory Visit | Attending: Obstetrics & Gynecology | Admitting: Obstetrics & Gynecology

## 2016-08-26 DIAGNOSIS — N6002 Solitary cyst of left breast: Secondary | ICD-10-CM

## 2016-08-26 DIAGNOSIS — N6310 Unspecified lump in the right breast, unspecified quadrant: Secondary | ICD-10-CM

## 2016-09-01 ENCOUNTER — Encounter: Payer: Self-pay | Admitting: Family Medicine

## 2016-09-03 ENCOUNTER — Ambulatory Visit (INDEPENDENT_AMBULATORY_CARE_PROVIDER_SITE_OTHER): Payer: BC Managed Care – PPO | Admitting: Family Medicine

## 2016-09-03 ENCOUNTER — Encounter: Payer: Self-pay | Admitting: Family Medicine

## 2016-09-03 ENCOUNTER — Encounter: Payer: Self-pay | Admitting: General Practice

## 2016-09-03 VITALS — BP 128/86 | HR 104 | Temp 98.5°F | Resp 16 | Ht 68.0 in | Wt 255.5 lb

## 2016-09-03 DIAGNOSIS — Z0000 Encounter for general adult medical examination without abnormal findings: Secondary | ICD-10-CM | POA: Diagnosis not present

## 2016-09-03 DIAGNOSIS — F32A Depression, unspecified: Secondary | ICD-10-CM

## 2016-09-03 DIAGNOSIS — F329 Major depressive disorder, single episode, unspecified: Secondary | ICD-10-CM

## 2016-09-03 DIAGNOSIS — E669 Obesity, unspecified: Secondary | ICD-10-CM | POA: Diagnosis not present

## 2016-09-03 DIAGNOSIS — F419 Anxiety disorder, unspecified: Secondary | ICD-10-CM

## 2016-09-03 LAB — CBC WITH DIFFERENTIAL/PLATELET
BASOS ABS: 0.1 10*3/uL (ref 0.0–0.1)
Basophils Relative: 1.3 % (ref 0.0–3.0)
Eosinophils Absolute: 0.4 10*3/uL (ref 0.0–0.7)
Eosinophils Relative: 6.4 % — ABNORMAL HIGH (ref 0.0–5.0)
HCT: 46.2 % — ABNORMAL HIGH (ref 36.0–46.0)
Hemoglobin: 15.4 g/dL — ABNORMAL HIGH (ref 12.0–15.0)
LYMPHS ABS: 2.2 10*3/uL (ref 0.7–4.0)
Lymphocytes Relative: 32.4 % (ref 12.0–46.0)
MCHC: 33.3 g/dL (ref 30.0–36.0)
MCV: 95.6 fl (ref 78.0–100.0)
MONOS PCT: 7.3 % (ref 3.0–12.0)
Monocytes Absolute: 0.5 10*3/uL (ref 0.1–1.0)
NEUTROS PCT: 52.6 % (ref 43.0–77.0)
Neutro Abs: 3.6 10*3/uL (ref 1.4–7.7)
Platelets: 263 10*3/uL (ref 150.0–400.0)
RBC: 4.83 Mil/uL (ref 3.87–5.11)
RDW: 12.8 % (ref 11.5–15.5)
WBC: 6.8 10*3/uL (ref 4.0–10.5)

## 2016-09-03 LAB — BASIC METABOLIC PANEL
BUN: 9 mg/dL (ref 6–23)
CALCIUM: 9.1 mg/dL (ref 8.4–10.5)
CO2: 28 meq/L (ref 19–32)
CREATININE: 0.77 mg/dL (ref 0.40–1.20)
Chloride: 109 mEq/L (ref 96–112)
GFR: 87.15 mL/min (ref >60.00)
GLUCOSE: 84 mg/dL (ref 70–99)
Potassium: 4.6 mEq/L (ref 3.5–5.1)
Sodium: 140 mEq/L (ref 135–145)

## 2016-09-03 LAB — HEPATIC FUNCTION PANEL
ALBUMIN: 3.7 g/dL (ref 3.5–5.2)
ALK PHOS: 67 U/L (ref 39–117)
ALT: 10 U/L (ref 0–35)
AST: 12 U/L (ref 0–37)
Bilirubin, Direct: 0.1 mg/dL (ref 0.0–0.3)
TOTAL PROTEIN: 6.2 g/dL (ref 6.0–8.3)
Total Bilirubin: 0.5 mg/dL (ref 0.2–1.2)

## 2016-09-03 LAB — LIPID PANEL
CHOLESTEROL: 140 mg/dL (ref 0–200)
HDL: 28.4 mg/dL — AB (ref >39.00)
LDL Cholesterol: 93 mg/dL (ref 0–99)
NonHDL: 111.43
TRIGLYCERIDES: 94 mg/dL (ref 0.0–149.0)
Total CHOL/HDL Ratio: 5
VLDL: 18.8 mg/dL (ref 0.0–40.0)

## 2016-09-03 MED ORDER — DULOXETINE HCL 30 MG PO CPEP: 30.0000 mg | ORAL_CAPSULE | Freq: Every day | ORAL | 1 refills | Status: DC

## 2016-09-03 NOTE — Progress Notes (Signed)
   Subjective:    Patient ID: Sally [name], female    DOB: 11/04/1975, 41 y.o.   MRN: [id number]  HPI CPE- UTD on pap, mammo (Dr Sabra Heck), Tdap.   Review of Systems Patient reports no vision/ hearing changes, adenopathy,fever, weight change,  persistant/recurrent hoarseness , swallowing issues, chest pain, palpitations, edema, persistant/recurrent cough, hemoptysis, dyspnea (rest/exertional/paroxysmal nocturnal), gastrointestinal bleeding (melena, rectal bleeding), abdominal pain, significant heartburn, bowel changes, GU symptoms (dysuria, hematuria, incontinence), Gyn symptoms (abnormal  bleeding, pain),  syncope, focal weakness, memory loss, numbness & tingling, skin/hair/nail changes, abnormal bruising or bleeding.  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  Pt is not able to tolerate the 75mg  dose of Effexor due to side effects.    Objective:   Physical Exam        Assessment & Plan:

## 2016-09-03 NOTE — Assessment & Plan Note (Signed)
Pt's PE WNL w/ exception of obesity.  UTD on GYN and Tdap.  Check labs.  Anticipatory guidance provided.

## 2016-09-03 NOTE — Assessment & Plan Note (Signed)
Check labs to risk stratify.  Stressed need for healthy diet and regular exercise.  Will follow.

## 2016-09-03 NOTE — Progress Notes (Signed)
Pre visit review using our clinic review tool, if applicable. No additional management support is needed unless otherwise documented below in the visit note. 

## 2016-09-03 NOTE — Assessment & Plan Note (Signed)
Ongoing issue.  Unable to take 75mg  Effexor due to side effects.  With her ongoing pain and fibromyalgia, will start Cymbalta 30mg  and monitor for improvement.  Pt expressed understanding and is in agreement w/ plan.

## 2016-09-03 NOTE — Patient Instructions (Signed)
Follow up in October when you get your new insurance to recheck mood Send me a MyChart message in mid-September letting me know how things are going We'll notify you of your lab results and make any changes if needed Continue to work on healthy diet and regular exercise- you can do it! STOP the Venlafaxine (Effexor) START the Cymbalta daily Call with any questions or concerns Hang in there!

## 2016-09-16 ENCOUNTER — Other Ambulatory Visit: Payer: Self-pay | Admitting: Family Medicine

## 2016-09-17 ENCOUNTER — Other Ambulatory Visit: Payer: Self-pay

## 2016-09-17 MED ORDER — LIOTHYRONINE SODIUM 5 MCG PO TABS: 15.0000 ug | ORAL_TABLET | Freq: Every day | ORAL | 1 refills | Status: AC

## 2016-09-18 ENCOUNTER — Ambulatory Visit (INDEPENDENT_AMBULATORY_CARE_PROVIDER_SITE_OTHER): Payer: BC Managed Care – PPO | Admitting: Family Medicine

## 2016-09-18 ENCOUNTER — Encounter: Payer: Self-pay | Admitting: Family Medicine

## 2016-09-18 DIAGNOSIS — I10 Essential (primary) hypertension: Secondary | ICD-10-CM | POA: Diagnosis not present

## 2016-09-18 MED ORDER — HYDROCHLOROTHIAZIDE 12.5 MG PO TABS: 12.5000 mg | ORAL_TABLET | Freq: Every day | ORAL | 3 refills | Status: DC

## 2016-09-18 NOTE — Progress Notes (Signed)
Pre visit review using our clinic review tool, if applicable. No additional management support is needed unless otherwise documented below in the visit note. 

## 2016-09-18 NOTE — Patient Instructions (Signed)
Follow up in 3-4 weeks to recheck BP and labs (if no insurance, we can do just a lab visit and you can MyChart the BP readings) START the HCTZ daily Try and limit your salt intake- this will improve BP The cost of the lab at the next visit should be <$50 Call with any questions or concerns Hang in there!  We'll figure this out!

## 2016-09-18 NOTE — Assessment & Plan Note (Signed)
New.  Pt has never been hypertensive before but w/ 2 elevated readings more than 2 weeks apart, she officially meets criteria.  Talked about low salt diet, smoking cessation.  Start HCTZ.  If BP doesn't decrease to goal, will add a combo med.  Pt is losing insurance tomorrow and fears she won't be able to come back for f/u.  Pt agreeable to pay for BMP out of pocket and we can do BP check via MyChart.  Pt expressed understanding and is in agreement w/ plan.

## 2016-09-18 NOTE — Progress Notes (Signed)
   Subjective:    Patient ID: Sally [name], female    DOB: 1975/08/06, 41 y.o.   MRN: [id number]  HPI HTN- 2 weeks ago on way to PT pt wasn't feeling well, had some dizziness and had to lie down.  BP was 174/119.  On Monday when pt went to PT BP was 186/109 but pt was asymptomatic.  + family hx of HTN.  Pt is a smoker.  Pt continues to have severe pain from knee replacement.  Pt is going to pain management clinic.  Under considerable stress w/ recent surgeries and loss of job.  No CP, SOB, HAs.  Pt is able to tell when BP is high b/c she gets dizziness and blurry vision.   Review of Systems For ROS see HPI     Objective:   Physical Exam  Constitutional: She is oriented to person, place, and time. She appears well-developed and well-nourished. No distress.  Obese, smells of cigarette smoke  HENT:  Head: Normocephalic and atraumatic.  Eyes: Pupils are equal, round, and reactive to light. Conjunctivae and EOM are normal.  Neck: Normal range of motion. Neck supple. No thyromegaly present.  Cardiovascular: Normal rate, regular rhythm, normal heart sounds and intact distal pulses.   No murmur heard. Pulmonary/Chest: Effort normal and breath sounds normal. No respiratory distress.  Abdominal: Soft. She exhibits no distension. There is no tenderness.  Musculoskeletal: She exhibits no edema.  Lymphadenopathy:    She has no cervical adenopathy.  Neurological: She is alert and oriented to person, place, and time.  Skin: Skin is warm and dry.  Psychiatric: She has a normal mood and affect. Her behavior is normal.  Vitals reviewed.         Assessment & Plan:

## 2016-09-19 ENCOUNTER — Other Ambulatory Visit: Payer: Self-pay

## 2016-09-19 ENCOUNTER — Telehealth: Payer: Self-pay | Admitting: Endocrinology

## 2016-09-19 MED ORDER — LEVOTHYROXINE SODIUM 125 MCG PO TABS: 125.0000 ug | ORAL_TABLET | Freq: Every day | ORAL | 3 refills | Status: DC

## 2016-09-19 NOTE — Telephone Encounter (Signed)
Patient called in reference to prescriptions for levothyroxine (SYNTHROID, LEVOTHROID) 125 MCG tablet and liothyronine (CYTOMEL) 5 MCG tablet needing to be approved for getting 90 day supply. Patient stated today is the last day she can file the insurance so she really needs this done today. Please call patient with any questions.

## 2016-09-19 NOTE — Telephone Encounter (Signed)
Ordered

## 2016-10-09 ENCOUNTER — Ambulatory Visit: Payer: Self-pay | Admitting: Family Medicine

## 2016-10-09 DIAGNOSIS — Z0289 Encounter for other administrative examinations: Secondary | ICD-10-CM

## 2016-10-10 ENCOUNTER — Other Ambulatory Visit: Payer: Self-pay

## 2016-10-15 ENCOUNTER — Ambulatory Visit: Payer: Self-pay | Admitting: Endocrinology

## 2016-10-15 DIAGNOSIS — Z0289 Encounter for other administrative examinations: Secondary | ICD-10-CM

## 2016-10-16 ENCOUNTER — Encounter: Payer: Self-pay | Admitting: Family Medicine

## 2016-10-16 ENCOUNTER — Ambulatory Visit (INDEPENDENT_AMBULATORY_CARE_PROVIDER_SITE_OTHER): Payer: Self-pay | Admitting: Family Medicine

## 2016-10-16 VITALS — BP 130/86 | HR 100 | Temp 99.1°F | Resp 18 | Ht 68.0 in | Wt 249.5 lb

## 2016-10-16 DIAGNOSIS — J019 Acute sinusitis, unspecified: Secondary | ICD-10-CM

## 2016-10-16 DIAGNOSIS — B9689 Other specified bacterial agents as the cause of diseases classified elsewhere: Secondary | ICD-10-CM

## 2016-10-16 MED ORDER — ALBUTEROL SULFATE HFA 108 (90 BASE) MCG/ACT IN AERS: 2.0000 | INHALATION_SPRAY | Freq: Four times a day (QID) | RESPIRATORY_TRACT | 2 refills | Status: DC | PRN

## 2016-10-16 MED ORDER — AMOXICILLIN 875 MG PO TABS: 875.0000 mg | ORAL_TABLET | Freq: Two times a day (BID) | ORAL | 0 refills | Status: DC

## 2016-10-16 NOTE — Patient Instructions (Signed)
Follow up as needed or as scheduled You have a sinus infection Start the Amoxicillin twice daily- take w/ food Drink plenty of fluids Mucinex DM for cough/congestion Alternate tylenol/ibuprofen for pain, fever Drink plenty of fluids REST! Call with any questions or concerns Hang in there!!!

## 2016-10-16 NOTE — Progress Notes (Signed)
   Subjective:    Patient ID: Sally [name], female    DOB: 06/20/1975, 41 y.o.   MRN: [id number]  HPI Fever/cough- sxs started 2 days ago w/ sinus pressure.  Yesterday had HA, nasal congestion, cough, shortness of breath.  Tm 100.  Mild achy feeling.  Yesterday had sore throat but not today.  Some swollen glands.  No known sick contacts.   Review of Systems For ROS see HPI     Objective:   Physical Exam  Constitutional: She is oriented to person, place, and time. She appears well-developed and well-nourished. No distress.  HENT:  Head: Normocephalic and atraumatic.  Right Ear: Tympanic membrane normal.  Left Ear: Tympanic membrane normal.  Nose: Mucosal edema and rhinorrhea present. Right sinus exhibits maxillary sinus tenderness. Right sinus exhibits no frontal sinus tenderness. Left sinus exhibits maxillary sinus tenderness. Left sinus exhibits no frontal sinus tenderness.  Mouth/Throat: Uvula is midline and mucous membranes are normal. Posterior oropharyngeal erythema present. No oropharyngeal exudate.  Eyes: Pupils are equal, round, and reactive to light. Conjunctivae and EOM are normal.  Neck: Normal range of motion. Neck supple.  Cardiovascular: Normal rate, regular rhythm and normal heart sounds.   Pulmonary/Chest: Effort normal. No respiratory distress. She has wheezes (scattered expiratory wheezes, coarse BS, and crackles).  + hacking cough  Lymphadenopathy:    She has no cervical adenopathy.  Neurological: She is alert and oriented to person, place, and time.  Skin: Skin is warm and dry.  Psychiatric: She has a normal mood and affect. Her behavior is normal. Thought content normal.          Assessment & Plan:  Acute bacterial sinusitis- new.  Pt's sxs and PE consistent w/ infxn.  She does have wheezing on exam today but does not want a nebulizer tx due to lack of insurance.  Albuterol inhaler provided as well as abx and instructions on cough medications.  If no  improvement, discussed need for xray given abnormal breath sounds but she again declines due to cost.  Reviewed supportive care and red flags that should prompt return.  Pt expressed understanding and is in agreement w/ plan.

## 2016-10-16 NOTE — Progress Notes (Signed)
Pre visit review using our clinic review tool, if applicable. No additional management support is needed unless otherwise documented below in the visit note. 

## 2017-01-07 ENCOUNTER — Encounter: Payer: Self-pay | Admitting: Obstetrics & Gynecology

## 2017-03-09 ENCOUNTER — Encounter: Payer: Self-pay | Admitting: General Practice

## 2017-04-13 ENCOUNTER — Encounter: Payer: Self-pay | Admitting: Neurology

## 2017-04-27 ENCOUNTER — Other Ambulatory Visit: Payer: Self-pay | Admitting: Family Medicine

## 2017-05-14 ENCOUNTER — Other Ambulatory Visit: Payer: Self-pay | Admitting: Endocrinology

## 2017-05-26 IMAGING — CR DG CHEST 2V
2 series · 2 of 2 positions shown · non-contrast
Comparison: October 22, 2005

CLINICAL DATA: Two week history of cough and shortness of breath.
Intermittent fever.

EXAM:
CHEST  2 VIEW

[PA]
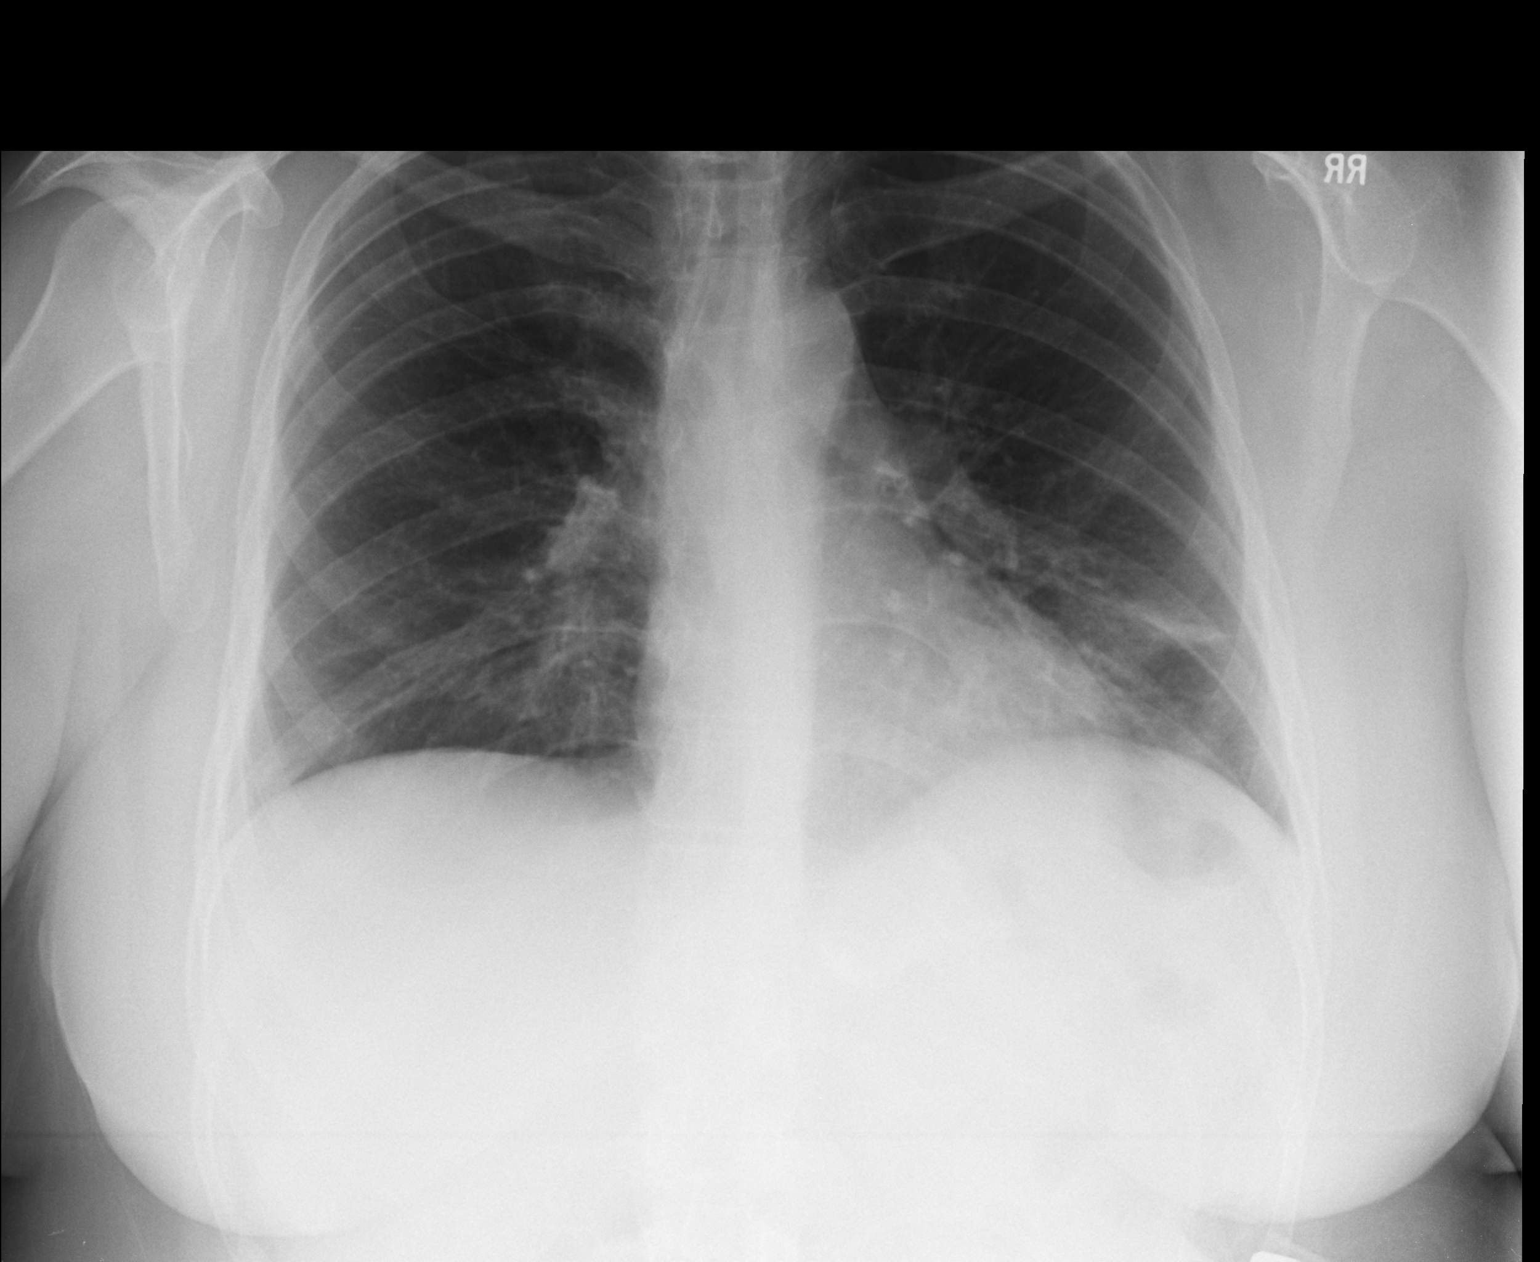

[lateral]
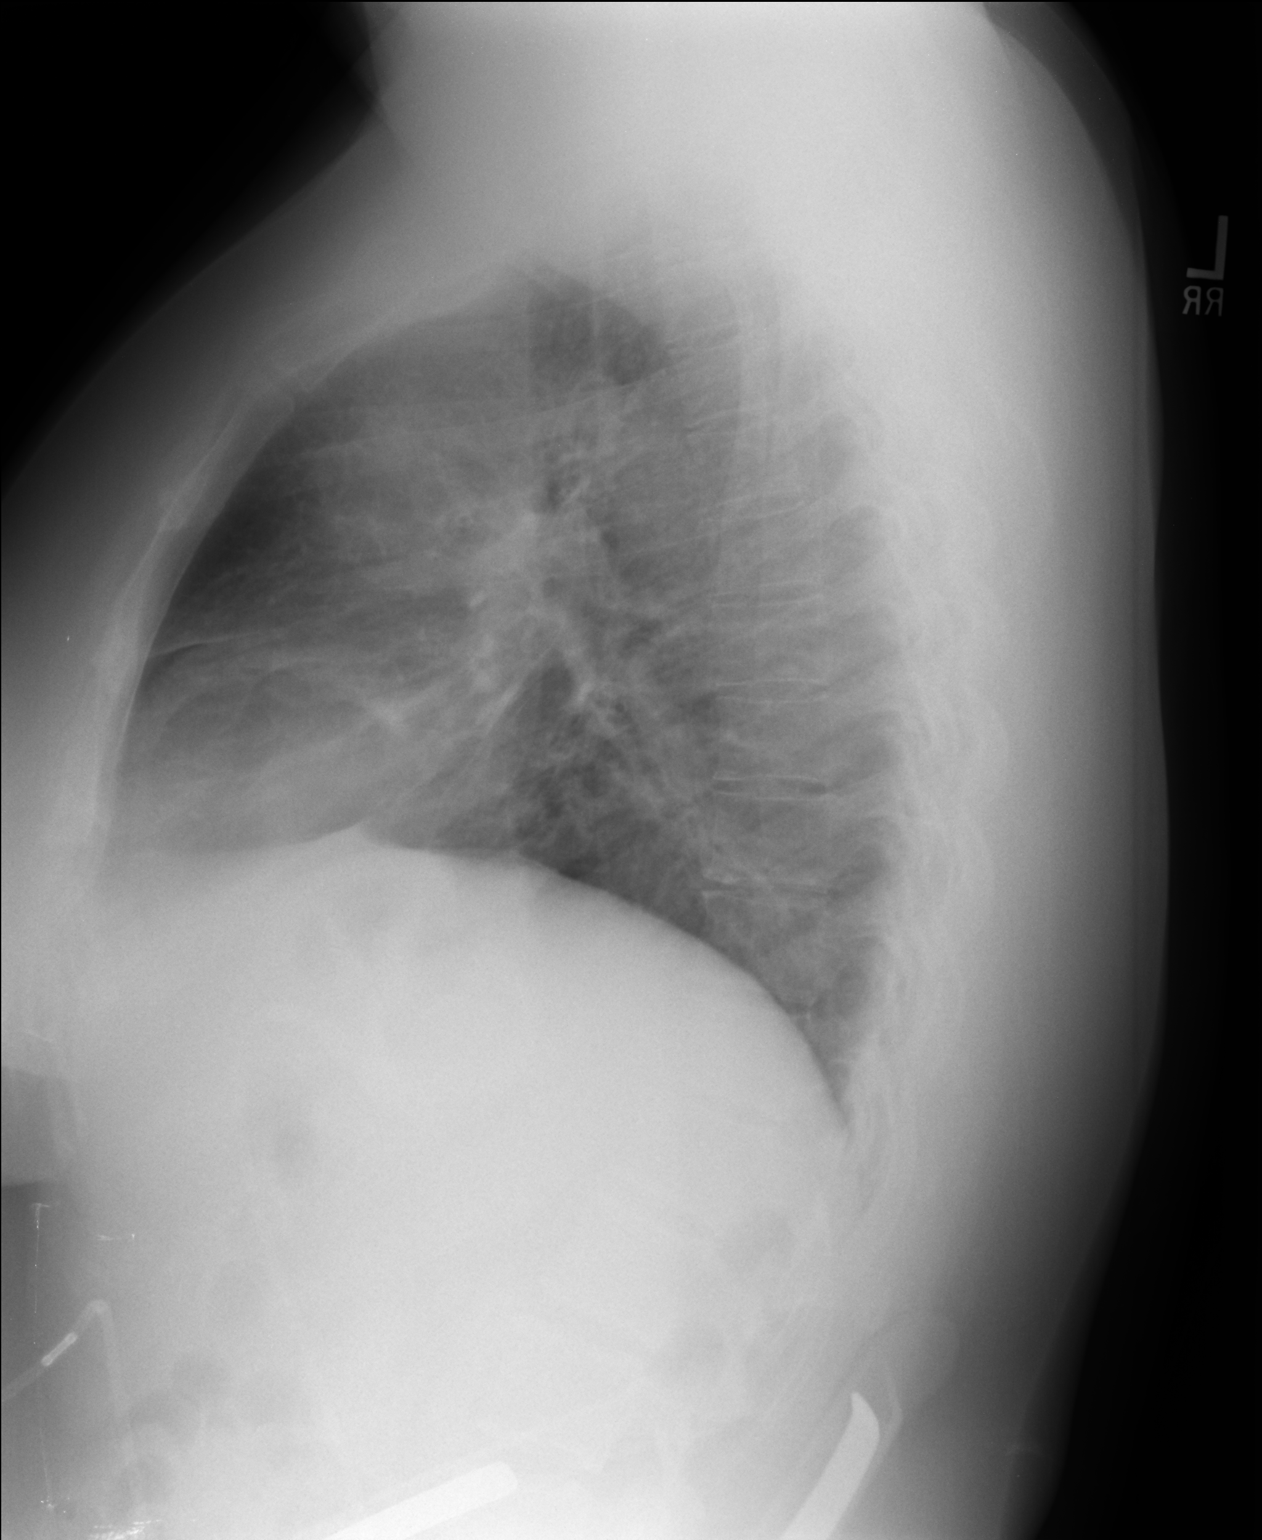

[2 of 2 positions shown; findings below may reference images not displayed]

FINDINGS: Focal opacity in the lingula is felt to represent early pneumonia.
Lungs elsewhere clear. Heart size and pulmonary vascularity are
normal. No adenopathy. No bone lesions.
IMPRESSION: Patchy infiltrate in the lingula consistent with pneumonia. Lungs
elsewhere clear. Cardiac silhouette within normal limits.

These results will be called to the ordering clinician or
representative by the Radiologist Assistant, and communication
documented in the PACS or zVision Dashboard.

## 2017-05-28 ENCOUNTER — Telehealth: Payer: Self-pay | Admitting: Psychology

## 2017-06-01 ENCOUNTER — Ambulatory Visit: Payer: Self-pay | Admitting: Psychology

## 2017-06-12 ENCOUNTER — Emergency Department (HOSPITAL_COMMUNITY): Payer: Self-pay

## 2017-06-12 ENCOUNTER — Other Ambulatory Visit: Payer: Self-pay

## 2017-06-12 ENCOUNTER — Emergency Department (HOSPITAL_COMMUNITY)
Admission: EM | Admit: 2017-06-12 | Discharge: 2017-06-12 | Disposition: A | Discharge status: Home / Self Care | Payer: Self-pay | Attending: Emergency Medicine | Admitting: Emergency Medicine

## 2017-06-12 ENCOUNTER — Encounter (HOSPITAL_COMMUNITY): Payer: Self-pay

## 2017-06-12 DIAGNOSIS — F17210 Nicotine dependence, cigarettes, uncomplicated: Secondary | ICD-10-CM | POA: Insufficient documentation

## 2017-06-12 DIAGNOSIS — J45909 Unspecified asthma, uncomplicated: Secondary | ICD-10-CM | POA: Insufficient documentation

## 2017-06-12 DIAGNOSIS — R6883 Chills (without fever): Secondary | ICD-10-CM | POA: Insufficient documentation

## 2017-06-12 DIAGNOSIS — Z79899 Other long term (current) drug therapy: Secondary | ICD-10-CM | POA: Insufficient documentation

## 2017-06-12 DIAGNOSIS — R110 Nausea: Secondary | ICD-10-CM | POA: Insufficient documentation

## 2017-06-12 DIAGNOSIS — E039 Hypothyroidism, unspecified: Secondary | ICD-10-CM | POA: Insufficient documentation

## 2017-06-12 DIAGNOSIS — R197 Diarrhea, unspecified: Secondary | ICD-10-CM | POA: Insufficient documentation

## 2017-06-12 DIAGNOSIS — I10 Essential (primary) hypertension: Secondary | ICD-10-CM | POA: Insufficient documentation

## 2017-06-12 DIAGNOSIS — R109 Unspecified abdominal pain: Secondary | ICD-10-CM | POA: Insufficient documentation

## 2017-06-12 DIAGNOSIS — Z96651 Presence of right artificial knee joint: Secondary | ICD-10-CM | POA: Insufficient documentation

## 2017-06-12 DIAGNOSIS — Z7982 Long term (current) use of aspirin: Secondary | ICD-10-CM | POA: Insufficient documentation

## 2017-06-12 LAB — CBC WITH DIFFERENTIAL/PLATELET
ABS IMMATURE GRANULOCYTES: 0 10*3/uL (ref 0.0–0.1)
BASOS ABS: 0.1 10*3/uL (ref 0.0–0.1)
Basophils Relative: 1 %
EOS PCT: 3 %
Eosinophils Absolute: 0.2 10*3/uL (ref 0.0–0.7)
HEMATOCRIT: 44.1 % (ref 36.0–46.0)
Hemoglobin: 14.4 g/dL (ref 12.0–15.0)
Immature Granulocytes: 0 %
LYMPHS ABS: 3.6 10*3/uL (ref 0.7–4.0)
LYMPHS PCT: 39 %
MCH: 31 pg (ref 26.0–34.0)
MCHC: 32.7 g/dL (ref 30.0–36.0)
MCV: 95 fL (ref 78.0–100.0)
MONOS PCT: 7 %
Monocytes Absolute: 0.6 10*3/uL (ref 0.1–1.0)
NEUTROS ABS: 4.7 10*3/uL (ref 1.7–7.7)
Neutrophils Relative %: 50 %
Platelets: 236 10*3/uL (ref 150–400)
RBC: 4.64 MIL/uL (ref 3.87–5.11)
RDW: 12.8 % (ref 11.5–15.5)
WBC: 9.4 10*3/uL (ref 4.0–10.5)

## 2017-06-12 LAB — URINALYSIS, ROUTINE W REFLEX MICROSCOPIC
Bilirubin Urine: NEGATIVE
GLUCOSE, UA: NEGATIVE mg/dL
HGB URINE DIPSTICK: NEGATIVE
Ketones, ur: NEGATIVE mg/dL
LEUKOCYTES UA: NEGATIVE
Nitrite: NEGATIVE
PH: 6 (ref 5.0–8.0)
PROTEIN: NEGATIVE mg/dL
Specific Gravity, Urine: 1.009 (ref 1.005–1.030)

## 2017-06-12 LAB — BASIC METABOLIC PANEL
Anion gap: 7 (ref 5–15)
BUN: 10 mg/dL (ref 6–20)
CO2: 25 mmol/L (ref 22–32)
CREATININE: 0.86 mg/dL (ref 0.44–1.00)
Calcium: 8.6 mg/dL — ABNORMAL LOW (ref 8.9–10.3)
Chloride: 108 mmol/L (ref 101–111)
GFR calc Af Amer: >60 mL/min (ref >60)
GFR calc non Af Amer: >60 mL/min (ref >60)
Glucose, Bld: 87 mg/dL (ref 65–99)
POTASSIUM: 4.2 mmol/L (ref 3.5–5.1)
SODIUM: 140 mmol/L (ref 135–145)

## 2017-06-12 MED ORDER — MORPHINE SULFATE (PF) 4 MG/ML IV SOLN
4.0000 mg | Freq: Once | INTRAVENOUS | Status: AC
  Administered 2017-06-12: 4 mg via INTRAVENOUS
  Filled 2017-06-12: qty 1

## 2017-06-12 MED ORDER — IOHEXOL 300 MG/ML  SOLN
100.0000 mL | Freq: Once | INTRAMUSCULAR | Status: AC | PRN
  Administered 2017-06-12: 100 mL via INTRAVENOUS

## 2017-06-12 MED ORDER — ONDANSETRON 4 MG PO TBDP
4.0000 mg | ORAL_TABLET | Freq: Once | ORAL | Status: AC | PRN
  Administered 2017-06-12: 4 mg via ORAL
  Filled 2017-06-12: qty 1

## 2017-06-12 MED ORDER — METOCLOPRAMIDE HCL 5 MG/ML IJ SOLN
5.0000 mg | Freq: Once | INTRAMUSCULAR | Status: AC
  Administered 2017-06-12: 5 mg via INTRAVENOUS
  Filled 2017-06-12: qty 2

## 2017-06-12 NOTE — ED Provider Notes (Addendum)
[facility] EMERGENCY DEPARTMENT Provider Note   CSN: [id number] Arrival date & time: 06/12/17  4270     History   Chief Complaint Chief Complaint  Patient presents with  . Flank Pain    HPI Sally [name] is a 42 y.o. female.  HPI Complains of left flank pain radiating to left side of abdomen onset gradually 2 days ago. Pain is worse with lying on her left side improved with other positions. Pain is moderate at present.She admits to nausea and dry heaves earlier today. Last bowel movement yesterday, diarrhea. She had 2 episodes diarrhea yesterday. Other associated symptoms include chills, no fever. No urinary symptoms. No other associated symptoms. No treatment prior to coming here. She was treated with Zofran in triage with improvement of nausea Past Medical History:  Diagnosis Date  . Acute appendicitis 09/09/2012  . Anxiety   . Asthma   . Back pain    Occ. issues wih back pain  . Back pain 09/09/2012   It is intermittent now,  previously source of fibromyalgia dx.    . Fibromyalgia   . Headache   . Hypothyroid 09/09/2012  . Hypothyroidism   . Osteoarthritis   . Precancerous changes of the cervix   . Sleep apnea    pre lap banding-never used cpap or mask  . Vitamin D deficiency   chronic pain followed in pain clinic  Patient Active Problem List   Diagnosis Date Noted  . HTN (hypertension) 09/18/2016  . Physical exam 09/03/2016  . Status post total right knee replacement 08/20/2016  . Migraines 03/28/2016  . Breast pain in female 01/28/2016  . Obesity (BMI 30-39.9) 01/07/2016  . Fibromyalgia 01/07/2016  . Smoker 06/16/2015  . Anxiety and depression 04/16/2015  . Fecal incontinence 10/09/2013  . Dyspareunia 10/09/2013  . Stress incontinence 09/24/2013  . Hypothyroid 09/09/2012  . Back pain 09/09/2012  . 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 Pelvic pain in female 08/19/2010    Past Surgical History:  Procedure Laterality Date  .  APPENDECTOMY    . BILATERAL SALPINGECTOMY Bilateral 11/05/2015   Procedure: BILATERAL SALPINGECTOMY;  Surgeon: [name], MD;  Location: [facility];  Service: Gynecology;  Laterality: Bilateral;  . BRAIN SURGERY  1992   remove blood clot after a fall  . CESAREAN SECTION  1997, 2013  . CYSTOSCOPY N/A 11/05/2015   Procedure: CYSTOSCOPY;  Surgeon: [name], MD;  Location: [facility];  Service: Gynecology;  Laterality: N/A;  . GASTRIC BANDING PORT REVISION  01/20/2012   Procedure: GASTRIC BANDING PORT REVISION;  Surgeon: Pedro Earls, MD;  Location: [facility];  Service: General;  Laterality: N/A;  . KNEE ARTHROSCOPY WITH DRILLING/MICROFRACTURE Right 12/06/2014   Procedure: KNEE ARTHROSCOPY WITH DRILLING/MICROFRACTURE, CHONDROPLASTY, EXCISION OF PLICA;  Surgeon: Dorna Leitz, MD;  Location: [facility];  Service: Orthopedics;  Laterality: Right;  . KNEE SURGERY    . KNEE SURGERY  06/26/2016   [facility]  . LAPAROSCOPIC APPENDECTOMY N/A 09/09/2012   Procedure: APPENDECTOMY LAPAROSCOPIC;  Surgeon: Merrie Roof, MD;  Location: [place];  Service: General;  Laterality: N/A;  . [place]  . LAPAROSCOPIC HYSTERECTOMY N/A 11/05/2015   Procedure: HYSTERECTOMY TOTAL LAPAROSCOPIC;  Surgeon: [name], MD;  Location: [facility];  Service: Gynecology;  Laterality: N/A;  . LAPAROSCOPY  08/19/2010   Procedure: LAPAROSCOPY OPERATIVE;  Surgeon: Felipa Emory;  Location: [facility];  Service: Gynecology;  Laterality: N/A;  with Biopsy of uterine serosa  . TUBAL LIGATION     8/13     OB History    Gravida  2   Para  2   Term  2   Preterm      AB      Living  2     SAB      TAB      Ectopic      Multiple      Live Births  1            Home Medications    Prior to Admission medications   Medication Sig Start Date End Date Taking? Authorizing Provider  acetaminophen (TYLENOL) 500 MG tablet Take 1,000 mg by mouth every 6 (six)  hours as needed. 06/27/16   [provider]  albuterol (PROVENTIL HFA;VENTOLIN HFA) 108 (90 Base) MCG/ACT inhaler Inhale 2 puffs into the lungs every 6 (six) hours as needed for wheezing or shortness of breath. 10/16/16   Midge Minium, MD  amoxicillin (AMOXIL) 875 MG tablet Take 1 tablet (875 mg total) by mouth 2 (two) times daily. 10/16/16   Midge Minium, MD  clonazePAM (KLONOPIN) 0.5 MG tablet TAKE 1 TABLET TWICE A DAY AS NEEDED FOR ANXIETY 08/18/16   Midge Minium, MD  [facility] ASPIRIN EC 325 MG EC tablet TAKE 1 TABLET BY MOUTH TWICE A DAY 04/27/17   Midge Minium, MD  DULoxetine (CYMBALTA) 30 MG capsule Take 1 capsule (30 mg total) by mouth daily. Patient not taking: Reported on 10/16/2016 09/03/16   Midge Minium, MD  gabapentin (NEURONTIN) 100 MG capsule Take 1 tab twice a day and 3 tabs at bedtime 07/03/16   [provider]  hydrochlorothiazide (HYDRODIURIL) 12.5 MG tablet Take 1 tablet (12.5 mg total) by mouth daily. 09/18/16   Midge Minium, MD  levothyroxine (SYNTHROID, LEVOTHROID) 125 MCG tablet Take 1 tablet (125 mcg total) by mouth daily. 09/19/16   Elayne Snare, MD  liothyronine (CYTOMEL) 5 MCG tablet Take 3 tablets (15 mcg total) by mouth daily. Take 2 tablets at breakfast and take one tablet at lunch 09/17/16   Elayne Snare, MD  loratadine (CLARITIN) 10 MG tablet Take 10 mg by mouth daily as needed for allergies.    [provider]  oxyCODONE (ROXICODONE) 5 MG immediate release tablet Take 1 tablet (5 mg total) by mouth every 4 (four) hours as needed for moderate pain or severe pain. 10/30/15   [name], MD  ranitidine (ZANTAC) 150 MG tablet Take 1 tablet (150 mg total) by mouth 2 (two) times daily. 11/13/15   [name], MD  senna-docusate (SENOKOT-S) 8.6-50 MG tablet Take by mouth as needed. 06/27/16   [provider]  venlafaxine XR (EFFEXOR-XR) 37.5 MG 24 hr capsule TAKE 1 CAPSULE (37.5 MG TOTAL) BY MOUTH DAILY WITH  BREAKFAST. 09/16/16   Midge Minium, MD  Vitamin D, Ergocalciferol, (DRISDOL) 50000 units CAPS capsule TAKE ONE CAPSULE EVERY 7 DAYS 08/21/16   [name], MD    Family History Family History  Problem Relation Age of Onset  . Hypertension Maternal Grandfather   . Diabetes Paternal Grandmother   . Anesthesia problems Neg Hx     Social History Social History   Tobacco Use  . Smoking status: Current Every Day Smoker    Packs/day: 1.00    Years: 20.00    Pack years: 20.00    Types: Cigarettes  . Smokeless tobacco: Never Used  Substance Use Topics  . Alcohol use: Yes    Comment: occ  . Drug use: No     Allergies   Paroxetine hcl; Percocet [oxycodone-acetaminophen]; and Codeine   Review of Systems Review of Systems  Constitutional: Negative.   HENT: Negative.   Respiratory: Positive for cough.        Chronic cough  Cardiovascular: Negative.   Gastrointestinal: Positive for diarrhea and nausea.  Genitourinary: Positive for flank pain.  Musculoskeletal: Positive for myalgias.       Chronic leg pain  Skin: Negative.   Neurological: Negative.   Psychiatric/Behavioral: Negative.   All other systems reviewed and are negative.    Physical Exam Updated Vital Signs BP (!) 149/54   Pulse 72   Temp 97.7 F (36.5 C) (Oral)   Resp 14   LMP 10/26/2015 (Exact Date)   SpO2 100%   Physical Exam  Constitutional: She is oriented to person, place, and time. She appears well-developed and well-nourished. No distress.  HENT:  Head: Normocephalic and atraumatic.  Eyes: Pupils are equal, round, and reactive to light. Conjunctivae are normal.  Neck: Neck supple. No tracheal deviation present. No thyromegaly present.  Cardiovascular: Normal rate and regular rhythm.  No murmur heard. Pulmonary/Chest: Effort normal and breath sounds normal.  Abdominal: Soft. Bowel sounds are normal. She exhibits no distension. There is tenderness.  Obese, left upper and left lower quadrant  tenderness, mild  Genitourinary:  Genitourinary Comments: Left flank tenderness  Musculoskeletal: Normal range of motion. She exhibits no edema or tenderness.  Neurological: She is alert and oriented to person, place, and time. Coordination normal.  Skin: Skin is warm and dry. Capillary refill takes less than 2 seconds. No rash noted.  Psychiatric: She has a normal mood and affect.  Nursing note and vitals reviewed.    ED Treatments / Results  Labs (all labs ordered are listed, but only abnormal results are displayed) Labs Reviewed  URINALYSIS, ROUTINE W REFLEX MICROSCOPIC    EKG None  Radiology No results found.  Procedures Procedures (including critical care time)  Medications Ordered in ED Medications  ondansetron (ZOFRAN-ODT) disintegrating tablet 4 mg (4 mg Oral Given 06/12/17 0350)    Results for orders placed or performed during the hospital encounter of 06/12/17  Urinalysis, Routine w reflex microscopic- may I&O cath if menses  Result Value Ref Range   Color, Urine YELLOW YELLOW   APPearance HAZY (A) CLEAR   Specific Gravity, Urine 1.009 1.005 - 1.030   pH 6.0 5.0 - 8.0   Glucose, UA NEGATIVE NEGATIVE mg/dL   Hgb urine dipstick NEGATIVE NEGATIVE   Bilirubin Urine NEGATIVE NEGATIVE   Ketones, ur NEGATIVE NEGATIVE mg/dL   Protein, ur NEGATIVE NEGATIVE mg/dL   Nitrite NEGATIVE NEGATIVE   Leukocytes, UA NEGATIVE NEGATIVE  CBC with Differential/Platelet  Result Value Ref Range   WBC 9.4 4.0 - 10.5 K/uL   RBC 4.64 3.87 - 5.11 MIL/uL   Hemoglobin 14.4 12.0 - 15.0 g/dL   HCT 44.1 36.0 - 46.0 %   MCV 95.0 78.0 - 100.0 fL   MCH 31.0 26.0 - 34.0 pg   MCHC 32.7 30.0 - 36.0 g/dL   RDW 12.8 11.5 - 15.5 %   Platelets 236 150 - 400 K/uL   Neutrophils Relative % 50 %   Neutro Abs 4.7 1.7 - 7.7 K/uL   Lymphocytes Relative 39 %   Lymphs Abs 3.6 0.7 - 4.0 K/uL   Monocytes Relative 7 %   Monocytes Absolute  0.6 0.1 - 1.0 K/uL   Eosinophils Relative 3 %   Eosinophils  Absolute 0.2 0.0 - 0.7 K/uL   Basophils Relative 1 %   Basophils Absolute 0.1 0.0 - 0.1 K/uL   Immature Granulocytes 0 %   Abs Immature Granulocytes 0.0 0.0 - 0.1 K/uL  Basic metabolic panel  Result Value Ref Range   Sodium 140 135 - 145 mmol/L   Potassium 4.2 3.5 - 5.1 mmol/L   Chloride 108 101 - 111 mmol/L   CO2 25 22 - 32 mmol/L   Glucose, Bld 87 65 - 99 mg/dL   BUN 10 6 - 20 mg/dL   Creatinine, Ser 0.86 0.44 - 1.00 mg/dL   Calcium 8.6 (L) 8.9 - 10.3 mg/dL   GFR calc non Af Amer >60 >60 mL/min   GFR calc Af Amer >60 >60 mL/min   Anion gap 7 5 - 15   Ct Abdomen Pelvis W Contrast  Result Date: 06/12/2017 CLINICAL DATA:  Left-sided abdominal pain for 2 days with nausea. EXAM: CT ABDOMEN AND PELVIS WITH CONTRAST TECHNIQUE: Multidetector CT imaging of the abdomen and pelvis was performed using the standard protocol following bolus administration of intravenous contrast. CONTRAST:  146mL OMNIPAQUE IOHEXOL 300 MG/ML  SOLN COMPARISON:  09/29/2012 FINDINGS: Lower chest: Passive atelectasis at each lung base. Normal heart size without pericardial effusion. Hepatobiliary: Homogeneous enhancement without mass or biliary dilatation. Normal gallbladder free of stones. Pancreas: Normal Spleen: Normal with adjacent 8 mm splenule Adrenals/Urinary Tract: Normal Stomach/Bowel: Status post appendectomy. Gastric lap band device is in place, oriented between the 2 o'clock and 8 o'clock position on the scout view, within normal limits and without overinflation. The stomach is decompressed in appearance. Normal small bowel rotation is noted. No bowel obstruction or inflammation. The colon is unremarkable. Vascular/Lymphatic: No significant vascular findings are present. No enlarged abdominal or pelvic lymph nodes. Reproductive: Status post hysterectomy. No adnexal masses. Other: Small amount of free fluid noted within the pelvis. No free air. Musculoskeletal: Degenerative disc disease L5-S1 with discogenic sclerosis  of the endplates. No acute osseous abnormality. IMPRESSION: 1. Status post gastric lap band in normal orientation and without overinflation. Decompressed stomach is noted. No acute bowel obstruction or inflammation. 2. No acute solid organ pathology. Status post hysterectomy. No adnexal mass. Trace physiologic free fluid in the pelvis. Electronically Signed   By: Ashley Royalty M.D.   On: 06/12/2017 14:13   Initial Impression / Assessment and Plan / ED Course  I have reviewed the triage vital signs and the nursing notes.  Pertinent labs & imaging results that were available during my care of the patient were reviewed by me and considered in my medical decision making (see chart for details).     2 50 p.m. Pain improved after treatment with intravenous morphine. She feels okay to go home. Plan follow-up PMD. Patient has oxycodone at home which she can take Lab work and CT scan unremarkable   Final Clinical Impressions(s) / ED Diagnoses  Dx #1 left flank pain #2left-sided abdominal pain Final diagnoses:  None    ED Discharge Orders    None       Orlie Dakin, MD 06/12/17 1458    Orlie Dakin, MD 06/12/17 1459

## 2017-06-12 NOTE — Discharge Instructions (Signed)
It is okay to take your oxycodone as directed. Lab work and CT scan were very reassuring. If your pain continues by next week, follow-up with Dr.Tabori in the office.

## 2017-06-12 NOTE — ED Triage Notes (Signed)
Pt reports left side flank pain. She reports feeling nauseous. Denies any blood in her urine. Afebrile in triage.

## 2017-06-12 NOTE — ED Notes (Signed)
Patient verbalizes understanding of discharge instructions. Opportunity for questioning and answers were provided. Armband removed by staff, pt discharged from ED.  

## 2017-06-12 NOTE — ED Notes (Signed)
IV attempt unsuccessful x 2

## 2017-06-19 ENCOUNTER — Other Ambulatory Visit: Payer: Self-pay | Admitting: Endocrinology

## 2017-07-28 ENCOUNTER — Encounter: Payer: No Typology Code available for payment source | Attending: Psychology | Admitting: Psychology

## 2017-07-28 DIAGNOSIS — Z96659 Presence of unspecified artificial knee joint: Secondary | ICD-10-CM | POA: Diagnosis not present

## 2017-07-28 DIAGNOSIS — Z8781 Personal history of (healed) traumatic fracture: Secondary | ICD-10-CM | POA: Diagnosis not present

## 2017-07-28 DIAGNOSIS — J45909 Unspecified asthma, uncomplicated: Secondary | ICD-10-CM | POA: Insufficient documentation

## 2017-07-28 DIAGNOSIS — F419 Anxiety disorder, unspecified: Secondary | ICD-10-CM

## 2017-07-28 DIAGNOSIS — F331 Major depressive disorder, recurrent, moderate: Secondary | ICD-10-CM

## 2017-07-28 DIAGNOSIS — E559 Vitamin D deficiency, unspecified: Secondary | ICD-10-CM | POA: Insufficient documentation

## 2017-07-28 DIAGNOSIS — E039 Hypothyroidism, unspecified: Secondary | ICD-10-CM | POA: Insufficient documentation

## 2017-07-28 DIAGNOSIS — M797 Fibromyalgia: Secondary | ICD-10-CM | POA: Diagnosis not present

## 2017-07-28 DIAGNOSIS — M1990 Unspecified osteoarthritis, unspecified site: Secondary | ICD-10-CM | POA: Insufficient documentation

## 2017-07-28 DIAGNOSIS — G8929 Other chronic pain: Secondary | ICD-10-CM | POA: Diagnosis present

## 2017-07-28 DIAGNOSIS — G894 Chronic pain syndrome: Secondary | ICD-10-CM | POA: Diagnosis not present

## 2017-08-06 ENCOUNTER — Encounter: Payer: Self-pay | Admitting: Psychology

## 2017-08-06 NOTE — Progress Notes (Signed)
Neuropsychological Consultation   Patient:   Sally [name]   DOB:   1975/02/10  MR Number:  [id number]  Location:  [facility] [address], [place] [id number] [place] [address] Dept: [phone]           Date of Service:   07/28/2017  Start Time:   10 AM End Time:   11 AM  Provider/Observer:  Ilean Skill, Psy.D.       Clinical Neuropsychologist       Billing Code/Service: [phone] 4 Units  Chief Complaint:    Sally [name] is a 42 year old female referred for therapeutic interventions and counseling due to chronic pain issues.  The patient reports that she fell at work and fractured both her elbows and also has had a knee replacement.  She has been taking opioid-based medications 4 times a day for some time.  The patient is also had a prior diagnosis of fibromyalgia before the accident.  The patient has lost her job but continues to get financial support through [employer].  The patient describes chronic pain, fatigue, and fibromyalgia type symptoms for some time.  Reason for Service:  Sally [name] is a 41 year old female referred by [place] for therapeutic interventions due to chronic pain symptoms.  The patient reports that she has great difficulty standing, walking for long times, elbow pain, migraines, hip pain, shoulder and neck pain.  The patient fell at work and fractured both her elbows and also had knee replacement surgery.  The patient has been taking maintenance doses of opioid pain medications for some time.  The patient reports that she has a great deal of difficulty functioning and has very poor sleep and wakes up numerous times each night.  The patient also has dealt with depression and anxiety for many years and has been hospitalized for depression anxiety years ago.  She has been receiving counseling for anxiety and depression and  takes Trintellix.  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  The patient describes chronic issues of depression anxiety but the development of severe chronic pain   Current Status:  After a fall at work in 2015 where she fractured both of her elbows, injured her knee and developed fibromyalgia/regional pain syndrome.  The patient reports that she has ongoing difficulties walking distances or standing.  She describes elbow pain, trouble lifting things and trouble writing for any period of time.  The patient describes significant sleep disturbance and is unable to fall asleep at night.  She reports that she is always tired.  The patient describes a normal appetite.  The patient describes memory issues related to trouble remembering things and concentration issues.  The patient reports that she is significantly less active with her children and husband and that her husband has to help more due to her significant pain.   Behavioral Observation: Sally [name]  presents as a 42 y.o.-year-old Right  Female who appeared her stated age. her dress was Appropriate and she was Well Groomed and her manners were Appropriate to the situation.  her participation was indicative of Appropriate and Attentive behaviors.  There were any physical disabilities noted.  she displayed an appropriate level of cooperation and motivation.     Interactions:    Active Appropriate  Attention:   abnormal and attention span and concentration were mildly impaired.  Memory:   abnormal; recent memory impaired.    Visuo-spatial:  not examined  Speech (Volume):  normal  Speech:   normal; normal  Thought  Process:  Coherent and Relevant  Though Content:  WNL; not suicidal and not homicidal  Orientation:   person, place, time/date and situation  Judgment:   Fair  Planning:   Fair  Affect:    Anxious and Depressed  Mood:    Anxious and Depressed  Insight:   Good  Intelligence:   normal  Marital Status/Living: The patient was born in [place] and has 2 siblings.  The patient reports that she fractured her skull when she was 42 years old and spent a month in the hospital.  She did deal with depression throughout her younger years.  The patient currently lives with her husband and youngest son who is 80 years old.  She was married in 2012.  Current Employment: The patient is not able to work right now.  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  She reports that she was terminated from this job after 20 years due to her medical issues.  Substance Use:  No concerns of substance abuse are reported.the patient denies any substance abuse but does smoke cigarettes.  Education:   HS Graduate the patient reports that she did take some college courses and had a GPA in college at 3.5.  She went to [employer].  She reports that her best subject in school was [place] and she did have difficulty with math.  Medical History:   Past Medical History:  Diagnosis Date  . Acute appendicitis 09/09/2012  . Anxiety   . Asthma   . Back pain    Occ. issues wih back pain  . Back pain 09/09/2012   It is intermittent now,  previously source of fibromyalgia dx.    . Fibromyalgia   . Headache   . Hypothyroid 09/09/2012  . Hypothyroidism   . Osteoarthritis   . Precancerous changes of the cervix   . Sleep apnea    pre lap banding-never used cpap or mask  . Vitamin D deficiency         Abuse/Trauma History: The patient reports that she fractured her skull when she was 42 years old and spent a month in the hospital.  Psychiatric History:  The patient  describes a prior history of depression and anxiety and has been hospitalized on an inpatient unit for her depression anxiety.  She does take an SSRI.  Family Med/Psych History:  Family History  Problem Relation Age of Onset  . Hypertension Maternal Grandfather   . Diabetes Paternal Grandmother   . Anesthesia problems Neg Hx     Risk of Suicide/Violence: low the patient denies any suicidal or homicidal ideation.  Impression/DX:  Sally [name] is a 42 year old female referred by [facility] for therapeutic interventions due to chronic pain symptoms.  The patient reports that she has great difficulty standing, walking for long times, elbow pain, migraines, hip pain, shoulder and neck pain.  The patient fell at work and fractured both her elbows and also had knee replacement surgery.  The patient has been taking maintenance doses of opioid pain medications for some time.  The patient reports that she has a great deal of difficulty functioning and has very poor sleep and wakes up numerous times each night.  The patient also has dealt with depression and anxiety for many years and has been hospitalized for depression anxiety years ago.  She has been receiving counseling for anxiety and depression and takes Trintellix.  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  The patient describes chronic issues of depression anxiety but the development of severe chronic pain  After a fall at work in 2015 where she fractured both of her elbows, injured her knee and developed fibromyalgia/regional pain syndrome.  The patient reports that she has ongoing difficulties walking distances or standing.  She  describes elbow pain, trouble lifting things and trouble writing for any period of time.  The patient describes significant sleep disturbance and is unable to fall asleep at night.  She reports that she is always tired.  The patient describes a normal appetite.  The patient describes memory issues related to trouble remembering things and concentration issues.  The patient reports that she is significantly less active with her children and husband and that her husband has to help more due to her significant pain.  Disposition/Plan:  We have set the patient up for individual therapeutic interventions due to chronic pain disorder.  Diagnosis:    Chronic pain disorder  Recurrent moderate major depressive disorder with anxiety ([place])         Electronically Signed   _______________________ Ilean Skill, Psy.D.

## 2017-08-07 ENCOUNTER — Telehealth: Payer: Self-pay | Admitting: *Deleted

## 2017-08-07 NOTE — Telephone Encounter (Signed)
Sally [name] called about the water aerobics you wanted her to do before her next appointment.  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

## 2017-08-14 ENCOUNTER — Ambulatory Visit: Payer: No Typology Code available for payment source | Admitting: Neurology

## 2017-09-15 ENCOUNTER — Encounter: Payer: Self-pay | Admitting: Psychology

## 2017-09-15 ENCOUNTER — Encounter: Payer: No Typology Code available for payment source | Attending: Psychology | Admitting: Psychology

## 2017-09-15 DIAGNOSIS — Z96659 Presence of unspecified artificial knee joint: Secondary | ICD-10-CM | POA: Insufficient documentation

## 2017-09-15 DIAGNOSIS — M797 Fibromyalgia: Secondary | ICD-10-CM | POA: Insufficient documentation

## 2017-09-15 DIAGNOSIS — M1990 Unspecified osteoarthritis, unspecified site: Secondary | ICD-10-CM | POA: Diagnosis not present

## 2017-09-15 DIAGNOSIS — F331 Major depressive disorder, recurrent, moderate: Secondary | ICD-10-CM

## 2017-09-15 DIAGNOSIS — E559 Vitamin D deficiency, unspecified: Secondary | ICD-10-CM | POA: Insufficient documentation

## 2017-09-15 DIAGNOSIS — E039 Hypothyroidism, unspecified: Secondary | ICD-10-CM | POA: Diagnosis not present

## 2017-09-15 DIAGNOSIS — F419 Anxiety disorder, unspecified: Secondary | ICD-10-CM | POA: Insufficient documentation

## 2017-09-15 DIAGNOSIS — J45909 Unspecified asthma, uncomplicated: Secondary | ICD-10-CM | POA: Diagnosis not present

## 2017-09-15 DIAGNOSIS — G8929 Other chronic pain: Secondary | ICD-10-CM | POA: Diagnosis not present

## 2017-09-15 DIAGNOSIS — G894 Chronic pain syndrome: Secondary | ICD-10-CM

## 2017-09-15 DIAGNOSIS — Z8781 Personal history of (healed) traumatic fracture: Secondary | ICD-10-CM | POA: Insufficient documentation

## 2017-09-15 NOTE — Progress Notes (Signed)
Patient:  Sally [name]   DOB: 1975-10-07  MR Number: [id number]  Location: [facility] [place], [address] [id number] [place] [address] Dept: [phone]  Start: 8 AM End: 9 AM  Provider/Observer:     Edgardo Roys PsyD  Chief Complaint:      Chief Complaint  Patient presents with  . Pain  . Depression  . Anxiety    Reason For Service:     Sally [name] is a 42 year old female referred by [facility] for therapeutic interventions due to chronic pain symptoms.  The patient reports that she has great difficulty standing, walking for long times, elbow pain, migraines, hip pain, shoulder and neck pain.  The patient fell at work and fractured both her elbows and also had knee replacement surgery.  The patient has been taking maintenance doses of opioid pain medications for some time.  The patient reports that she has a great deal of difficulty functioning and has very poor sleep and wakes up numerous times each night.  The patient also has dealt with depression and anxiety for many years and has been hospitalized for depression anxiety years ago.  She has been receiving counseling for anxiety and depression and takes Trintellix.  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  The patient describes chronic issues of depression anxiety but the development of severe chronic pain  Interventions Strategy:  Cognitive/behavioral therapeutic interventions along with coping skills and strategies around chronic pain, sleep disturbance and depression.  Participation  Level:   Active  Participation Quality:  Appropriate and Attentive      Behavioral Observation:  Well Groomed, Alert, and Appropriate.   Current Psychosocial Factors: The patient reports that she is trying to be as active as she can.  However, she reports that there were multiple days recently where her fibromyalgia and severe pain left her in the bed most the time.  She reports that this is distressing to her and frustrating to her husband as he ends up having to do both what is expectations are as well as help her out and do things with the family that she would have normally done.  Content of Session:   Reviewed current symptoms and continue to work on therapeutic interventions around issues related to chronic pain and depression along with fibromyalgia type symptoms.  Current Status:   The patient reports that her pain and fibromyalgia symptoms have had an overall improvement but acute worsening is recently.  She reports that she continues to have her posttraumatic headaches as well.  Patient Progress:   The patient is progressing as this is only our second appointment she is actively working on the therapeutic interventions we have begun to develop.   Impression/Diagnosis:   Sally [name] is a 42 year old female referred by [facility] for therapeutic interventions due to chronic pain symptoms.  The patient reports that she has great difficulty standing, walking for long times, elbow pain, migraines, hip pain, shoulder and neck pain.  The patient fell at work and fractured both her elbows and also had knee replacement surgery.  The patient has been  taking maintenance doses of opioid pain medications for some time.  The patient reports that she has a great deal of difficulty functioning and has very poor sleep and wakes up numerous times each night.  The patient also has dealt with depression and anxiety for many years and has been hospitalized for depression anxiety years ago.  She has been  receiving counseling for anxiety and depression and takes Trintellix.  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  The patient describes chronic issues of depression anxiety but the development of severe chronic pain  After a fall at work in 2015 where she fractured both of her elbows, injured her knee and developed fibromyalgia/regional pain syndrome.  The patient reports that she has ongoing difficulties walking distances or standing.  She describes elbow pain, trouble lifting things and trouble writing for any period of time.  The patient describes significant sleep disturbance and is unable to fall asleep at night.  She reports that she is always tired.  The patient describes a normal appetite.  The patient describes memory issues related to trouble remembering things and concentration issues.  The patient reports that she is significantly less active with her children and husband and that her husband has to help more due to her significant pain.  The above impression/diagnoses remain accurate to the current visit.  They have been reviewed for this visit and remain appropriate.  The patient does report that she has had significant episodes recently of fibromyalgia and chronic pain left her in the bed most of the time.  Diagnosis:   Chronic pain disorder  Recurrent moderate major depressive disorder with anxiety ([place])

## 2017-09-25 ENCOUNTER — Encounter: Payer: Self-pay | Admitting: Psychology

## 2017-09-25 ENCOUNTER — Encounter: Payer: No Typology Code available for payment source | Attending: Psychology | Admitting: Psychology

## 2017-09-25 DIAGNOSIS — Z96659 Presence of unspecified artificial knee joint: Secondary | ICD-10-CM | POA: Diagnosis not present

## 2017-09-25 DIAGNOSIS — G8929 Other chronic pain: Secondary | ICD-10-CM | POA: Insufficient documentation

## 2017-09-25 DIAGNOSIS — F331 Major depressive disorder, recurrent, moderate: Secondary | ICD-10-CM | POA: Insufficient documentation

## 2017-09-25 DIAGNOSIS — Z8781 Personal history of (healed) traumatic fracture: Secondary | ICD-10-CM | POA: Insufficient documentation

## 2017-09-25 DIAGNOSIS — G894 Chronic pain syndrome: Secondary | ICD-10-CM

## 2017-09-25 DIAGNOSIS — J45909 Unspecified asthma, uncomplicated: Secondary | ICD-10-CM | POA: Diagnosis not present

## 2017-09-25 DIAGNOSIS — M797 Fibromyalgia: Secondary | ICD-10-CM | POA: Insufficient documentation

## 2017-09-25 DIAGNOSIS — E559 Vitamin D deficiency, unspecified: Secondary | ICD-10-CM | POA: Diagnosis not present

## 2017-09-25 DIAGNOSIS — M1990 Unspecified osteoarthritis, unspecified site: Secondary | ICD-10-CM | POA: Insufficient documentation

## 2017-09-25 DIAGNOSIS — E039 Hypothyroidism, unspecified: Secondary | ICD-10-CM | POA: Insufficient documentation

## 2017-09-25 DIAGNOSIS — F419 Anxiety disorder, unspecified: Secondary | ICD-10-CM

## 2017-09-25 NOTE — Progress Notes (Signed)
Patient:  Sally [name]   DOB: 1975/05/16  MR Number: [id number]  Location: [facility] [place], [address] [id number] [place] [address] Dept: [phone]  Start: 8 AM End: 9 AM  Provider/Observer:     Edgardo Roys PsyD  Chief Complaint:      Chief Complaint  Patient presents with  . Anxiety  . Depression  . Pain    Reason For Service:     Sally [name] is a 42 year old female referred by [facility] for therapeutic interventions due to chronic pain symptoms.  The patient reports that she has great difficulty standing, walking for long times, elbow pain, migraines, hip pain, shoulder and neck pain.  The patient fell at work and fractured both her elbows and also had knee replacement surgery.  The patient has been taking maintenance doses of opioid pain medications for some time.  The patient reports that she has a great deal of difficulty functioning and has very poor sleep and wakes up numerous times each night.  The patient also has dealt with depression and anxiety for many years and has been hospitalized for depression anxiety years ago.  She has been receiving counseling for anxiety and depression and takes Trintellix.  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  The patient describes chronic issues of depression anxiety but the development of severe chronic pain.  The above reason for service has been reviewed and remains applicable for the current visit.  The patient reports that she continues to have significant pain but feels like her [name]  mood is been improving and has been actively working on therapeutic interventions.  Interventions Strategy:  Cognitive/behavioral therapeutic interventions along with coping skills and strategies around chronic pain, sleep disturbance and depression.    Participation Level:   Active  Participation Quality:  Appropriate and Attentive      Behavioral Observation:  Well Groomed, Alert, and Appropriate.   Current Psychosocial Factors: The patient reports that she continues to be as active as she can turn to help out around the house.  The patient reports that she has fallen at least one time since her last visit and reports that this worries her limits what she is doing around the house.  Content of Session:   Reviewed current symptoms and continue to work on therapeutic interventions around issues related to chronic pain and depression along with fibromyalgia type symptoms as well as potential RSD symptoms in her lower right leg.  Current Status:   The patient reports that she continues to have significant pain and fibromyalgia/RSD type symptoms.  The patient reports that she is improved her sleep recently has been actively working on sleep, dietary changes, and activity changes.  Patient Progress:   The patient continues to improve her sleep patterns and is been actively working on the therapeutic interventions.  Impression/Diagnosis:   Sally [name] is a 42 year old female referred by [place] for therapeutic interventions due to chronic pain symptoms.  The patient reports that she has great difficulty standing, walking for long times, elbow pain, migraines, hip pain, shoulder and neck pain.  The patient fell at work and fractured both her elbows and also had knee replacement surgery.  The patient has been taking maintenance doses of opioid pain medications for some time.  The patient reports that she has a great deal of difficulty functioning and has very poor sleep and wakes up numerous  times each night.  The patient also has dealt with depression and anxiety for many years and has been hospitalized for depression anxiety years ago.  She has been receiving counseling for anxiety and depression and takes Trintellix.  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  The patient describes chronic issues of depression anxiety but the development of severe chronic pain  After a fall at work in 2015 where she fractured both of her elbows, injured her knee and developed fibromyalgia/regional pain syndrome.  The patient reports that she has ongoing difficulties walking distances or standing.  She describes elbow pain, trouble lifting things and trouble writing for any period of time.  The patient describes significant sleep disturbance and is unable to fall asleep at night.  She reports that she is always tired.  The patient describes a normal appetite.  The patient describes memory issues related to trouble remembering things and concentration issues.  The patient reports that she is significantly less active with her children and husband and that her husband has to help more due to her significant pain.  The above impression/diagnoses remain accurate for the current visit.  I reviewed these for this visit and the patient continues to have significant fibromyalgia and chronic pain symptoms.  Diagnosis:   Chronic pain disorder  Recurrent moderate major depressive disorder with anxiety ([name])

## 2017-10-08 ENCOUNTER — Encounter (HOSPITAL_BASED_OUTPATIENT_CLINIC_OR_DEPARTMENT_OTHER): Payer: No Typology Code available for payment source | Admitting: Psychology

## 2017-10-08 DIAGNOSIS — F331 Major depressive disorder, recurrent, moderate: Secondary | ICD-10-CM | POA: Diagnosis not present

## 2017-10-08 DIAGNOSIS — G894 Chronic pain syndrome: Secondary | ICD-10-CM

## 2017-10-08 DIAGNOSIS — F419 Anxiety disorder, unspecified: Secondary | ICD-10-CM

## 2017-10-08 DIAGNOSIS — G8929 Other chronic pain: Secondary | ICD-10-CM | POA: Diagnosis not present

## 2017-10-22 ENCOUNTER — Ambulatory Visit: Payer: Self-pay | Admitting: Obstetrics & Gynecology

## 2017-10-22 ENCOUNTER — Ambulatory Visit: Payer: BC Managed Care – PPO | Admitting: Obstetrics & Gynecology

## 2017-10-29 ENCOUNTER — Encounter: Payer: No Typology Code available for payment source | Attending: Psychology | Admitting: Psychology

## 2017-10-29 DIAGNOSIS — G894 Chronic pain syndrome: Secondary | ICD-10-CM

## 2017-10-29 DIAGNOSIS — J45909 Unspecified asthma, uncomplicated: Secondary | ICD-10-CM | POA: Insufficient documentation

## 2017-10-29 DIAGNOSIS — M797 Fibromyalgia: Secondary | ICD-10-CM | POA: Diagnosis not present

## 2017-10-29 DIAGNOSIS — Z96659 Presence of unspecified artificial knee joint: Secondary | ICD-10-CM | POA: Diagnosis not present

## 2017-10-29 DIAGNOSIS — G8929 Other chronic pain: Secondary | ICD-10-CM | POA: Diagnosis not present

## 2017-10-29 DIAGNOSIS — E039 Hypothyroidism, unspecified: Secondary | ICD-10-CM | POA: Diagnosis not present

## 2017-10-29 DIAGNOSIS — F419 Anxiety disorder, unspecified: Secondary | ICD-10-CM | POA: Diagnosis not present

## 2017-10-29 DIAGNOSIS — Z8781 Personal history of (healed) traumatic fracture: Secondary | ICD-10-CM | POA: Insufficient documentation

## 2017-10-29 DIAGNOSIS — E559 Vitamin D deficiency, unspecified: Secondary | ICD-10-CM | POA: Insufficient documentation

## 2017-10-29 DIAGNOSIS — M1990 Unspecified osteoarthritis, unspecified site: Secondary | ICD-10-CM | POA: Diagnosis not present

## 2017-10-29 DIAGNOSIS — F331 Major depressive disorder, recurrent, moderate: Secondary | ICD-10-CM

## 2017-10-31 ENCOUNTER — Encounter: Payer: Self-pay | Admitting: Psychology

## 2017-10-31 NOTE — Progress Notes (Signed)
Patient:  Sally [name]   DOB: 02-14-1975  MR Number: [id number]  Location: [facility] FOR PAIN AND REHABILITATIVE MEDICINE [facility] PHYSICAL MEDICINE AND REHABILITATION [place], [address] [id number] [place] [address] Dept: [phone]  Start: 8 AM End: 9 AM  Provider/Observer:     Edgardo Roys PsyD  Chief Complaint:      Chief Complaint  Patient presents with  . Pain  . Anxiety  . Depression    Reason For Service:     Sally [name] is a 42 year old female referred by [place] for therapeutic interventions due to chronic pain symptoms.  The patient reports that she has great difficulty standing, walking for long times, elbow pain, migraines, hip pain, shoulder and neck pain.  The patient fell at work and fractured both her elbows and also had knee replacement surgery.  The patient has been taking maintenance doses of opioid pain medications for some time.  The patient reports that she has a great deal of difficulty functioning and has very poor sleep and wakes up numerous times each night.  The patient also has dealt with depression and anxiety for many years and has been hospitalized for depression anxiety years ago.  She has been receiving counseling for anxiety and depression and takes Trintellix.  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  The patient describes chronic issues of depression anxiety but the development of severe chronic pain.  The above reason for service has been reviewed for this visit and remains applicable for the current visit.  The patient does describe a working on trying to better manage her mood  disorder and has been actively working on the therapeutic interventions.  Interventions Strategy:  Cognitive/behavioral therapeutic interventions along with coping skills and strategies around chronic pain, sleep disturbance and depression.  Participation Level:   Active  Participation Quality:  Appropriate and Attentive      Behavioral Observation:  Well Groomed, Alert, and Appropriate.   Current Psychosocial Factors: The patient reports that she has been continuing to actively work on therapeutic interventions and is been trying to do more around the house and engagement with her family.  However, the patient reports that she has had a lot of difficulty particularly with leg  Content of Session:   Reviewed current symptoms and continue to work on therapeutic interventions around issues related to chronic pain and depression along with fibromyalgia type symptoms as well as potential RSD symptoms in her lower right leg.  Current Status:   The patient reports that she continues to have significant pain and fibromyalgia/RSD type symptoms.  The patient reports that she is improved her sleep recently has been actively working on sleep, dietary changes, and activity changes.  Patient Progress:   The patient continues to improve her sleep patterns and is been actively working on the therapeutic interventions.  Impression/Diagnosis:   Sally [name] is a 42 year old female referred by [place] for therapeutic interventions due to chronic pain symptoms.  The patient reports that she has great difficulty standing, walking for long times, elbow pain, migraines, hip pain, shoulder and neck pain.  The patient fell at work and fractured both  her elbows and also had knee replacement surgery.  The patient has been taking maintenance doses of opioid pain medications for some time.  The patient reports that she has a great deal of difficulty functioning and has very poor sleep and wakes up numerous times  each night.  The patient also has dealt with depression and anxiety for many years and has been hospitalized for depression anxiety years ago.  She has been receiving counseling for anxiety and depression and takes Trintellix.  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  The patient describes chronic issues of depression anxiety but the development of severe chronic pain  After a fall at work in 2015 where she fractured both of her elbows, injured her knee and developed fibromyalgia/regional pain syndrome.  The patient reports that she has ongoing difficulties walking distances or standing.  She describes elbow pain, trouble lifting things and trouble writing for any period of time.  The patient describes significant sleep disturbance and is unable to fall asleep at night.  She reports that she is always tired.  The patient describes a normal appetite.  The patient describes memory issues related to trouble remembering things and concentration issues.  The patient reports that she is significantly less active with her children and husband and that her husband has to help more due to her significant pain.  The above impression/diagnoses remain accurate for the current visit.  I reviewed these for this visit and the patient continues to have significant fibromyalgia and chronic pain symptoms.  Diagnosis:   Chronic pain disorder  Recurrent moderate major depressive disorder with anxiety ([place])

## 2017-11-04 NOTE — Progress Notes (Signed)
Patient:  Sally [name]   DOB: 1975-07-06  MR Number: [id number]  Location: [facility] FOR PAIN AND REHABILITATIVE MEDICINE [facility] PHYSICAL MEDICINE AND REHABILITATION [place], [place] [id number] [place] [id number] Dept: [phone]  Start: 3 PM End: 4 PM  Provider/Observer:     Edgardo Roys PsyD  Chief Complaint:      Chief Complaint  Patient presents with  . Pain  . Anxiety  . Depression    Reason For Service:     Sally [name] is a 42 year old female referred by [place] for therapeutic interventions due to chronic pain symptoms.  The patient reports that she has great difficulty standing, walking for long times, elbow pain, migraines, hip pain, shoulder and neck pain.  The patient fell at work and fractured both her elbows and also had knee replacement surgery.  The patient has been taking maintenance doses of opioid pain medications for some time.  The patient reports that she has a great deal of difficulty functioning and has very poor sleep and wakes up numerous times each night.  The patient also has dealt with depression and anxiety for many years and has been hospitalized for depression anxiety years ago.  She has been receiving counseling for anxiety and depression and takes Trintellix.  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  The patient describes chronic issues of depression anxiety but the development of severe chronic pain.  The above reason for service has been reviewed for this visit and remains applicable for the current visit.  The patient does describe continuing to work on better managing her mood  disorder and actively working on the therapeutic interventions we have been developing.  Interventions Strategy:  Cognitive/behavioral therapeutic interventions along with coping skills and strategies around chronic pain, sleep disturbance and depression.  Participation Level:   Active  Participation Quality:  Appropriate and Attentive      Behavioral Observation:  Well Groomed, Alert, and Appropriate.   Current Psychosocial Factors: The patient reports that she is continuing to struggle on dealing with family engagement and being able to do things around the house.  The patient reports that significant pain in her legs are big limit for her.  She does report that her depression and coping has been improving and she has been actively working on therapeutic interventions.  Content of Session:   Reviewed current symptoms and continue to work on therapeutic interventions around issues related to chronic pain and depression along with fibromyalgia type symptoms as well as potential RSD symptoms in her lower right leg.  Current Status:   The patient reports that she continues to have significant pain and fibromyalgia/RSD type symptoms.  The patient reports that she is improved her sleep recently has been actively working on sleep, dietary changes, and activity changes.  Patient Progress:   The patient continues to improve her sleep patterns and is been actively working on the therapeutic interventions.  Impression/Diagnosis:   Sally [name] is a 42 year old female referred by [place] for therapeutic interventions due to chronic pain symptoms.  The patient reports that she has great difficulty standing, walking for long times, elbow pain, migraines,  hip pain, shoulder and neck pain.  The patient fell at work and fractured both her elbows and also had knee replacement surgery.  The patient has been taking maintenance doses of opioid pain medications for some time.  The patient reports that she  has a great deal of difficulty functioning and has very poor sleep and wakes up numerous times each night.  The patient also has dealt with depression and anxiety for many years and has been hospitalized for depression anxiety years ago.  She has been receiving counseling for anxiety and depression and takes Trintellix.  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  The patient describes chronic issues of depression anxiety but the development of severe chronic pain  After a fall at work in 2015 where she fractured both of her elbows, injured her knee and developed fibromyalgia/regional pain syndrome.  The patient reports that she has ongoing difficulties walking distances or standing.  She describes elbow pain, trouble lifting things and trouble writing for any period of time.  The patient describes significant sleep disturbance and is unable to fall asleep at night.  She reports that she is always tired.  The patient describes a normal appetite.  The patient describes memory issues related to trouble remembering things and concentration issues.  The patient reports that she is significantly less active with her children and husband and that her husband has to help more due to her significant pain.  The above impression/diagnoses remain accurate for the current visit.  I reviewed these for this visit and the patient continues to have significant fibromyalgia and chronic pain symptoms.  Diagnosis:   Chronic pain disorder  Recurrent moderate major depressive disorder with anxiety ([place])

## 2017-11-16 ENCOUNTER — Encounter: Payer: Self-pay | Admitting: Neurology

## 2017-11-16 ENCOUNTER — Ambulatory Visit: Payer: No Typology Code available for payment source | Admitting: Neurology

## 2017-12-02 ENCOUNTER — Ambulatory Visit: Payer: Self-pay

## 2017-12-02 NOTE — Telephone Encounter (Signed)
Pt called stating that she is having frequency and burning with urination she rates at 10. She states that she woke up with the symptoms this morning.  She states that she has passed blood clots. Pt does not have fever.  She has flank pain and abdominal pain that feels like cramping.  She rates it uncomfortable 3-4.  Pt tried ASO and was calling for more OTC advice. Pt was informed that she need to be seen for these symptoms. Pt refused office visit. States that she will go to the ED for care. She has no money and the hospital will work with her. She will also try her local health department to see if anyone could see her.  She was advised not to delay care. She was told she needs to be seen today. Pt verbalized understanding. Care advice read to patient. Pt verbalized understanding. Reason for Disposition . Side (flank) or back pain present  Answer Assessment - Initial Assessment Questions 1. SYMPTOM: "What's the main symptom you're concerned about?" (e.g., frequency, incontinence)     Burning pain with urination 2. ONSET: "When did the  symptoms  start?"     This AM 3. PAIN: "Is there any pain?" If so, ask: "How bad is it?" (Scale: 1-10; mild, moderate, severe)     When peeing its a 10  When not 3-4 uncomfortable 4. CAUSE: "What do you think is causing the symptoms?"     UTI 5. OTHER SYMPTOMS: "Do you have any other symptoms?" (e.g., fever, flank pain, blood in urine, pain with urination)     Blood clots, flank pain 6. PREGNANCY: "Is there any chance you are pregnant?" "When was your last menstrual period?"     No histerectomy  Answer Assessment - Initial Assessment Questions 1. COLOR of URINE: "Describe the color of the urine."  (e.g., tea-colored, pink, red, blood clots, bloody)     clots 2. ONSET: "When did the bleeding start?"      today 3. EPISODES: "How many times has there been blood in the urine?" or "How many times today?"     Just one time 4. PAIN with URINATION: "Is there any  pain with passing your urine?" If so, ask: "How bad is the pain?"  (Scale 1-10; or mild, moderate, severe)    - MILD - complains slightly about urination hurting    - MODERATE - interferes with normal activities      - SEVERE - excruciating, unwilling or unable to urinate because of the pain      10 5. FEVER: "Do you have a fever?" If so, ask: "What is your temperature, how was it measured, and when did it start?"     unsure 6. ASSOCIATED SYMPTOMS: "Are you passing urine more frequently than usual?"     frequency 7. OTHER SYMPTOMS: "Do you have any other symptoms?" (e.g., back/flank pain, abdominal pain, vomiting)     Flank pain abdominal pain nausea 8. PREGNANCY: "Is there any chance you are pregnant?" "When was your last menstrual period?"     No histerectomy  Protocols used: URINE - BLOOD IN-A-AH, URINARY [facility]

## 2017-12-24 ENCOUNTER — Encounter

## 2017-12-24 ENCOUNTER — Encounter: Payer: Worker's Compensation | Attending: Psychology | Admitting: Psychology

## 2017-12-24 DIAGNOSIS — F419 Anxiety disorder, unspecified: Secondary | ICD-10-CM

## 2017-12-24 DIAGNOSIS — G8929 Other chronic pain: Secondary | ICD-10-CM | POA: Insufficient documentation

## 2017-12-24 DIAGNOSIS — E039 Hypothyroidism, unspecified: Secondary | ICD-10-CM | POA: Diagnosis not present

## 2017-12-24 DIAGNOSIS — E559 Vitamin D deficiency, unspecified: Secondary | ICD-10-CM | POA: Diagnosis not present

## 2017-12-24 DIAGNOSIS — M1990 Unspecified osteoarthritis, unspecified site: Secondary | ICD-10-CM | POA: Insufficient documentation

## 2017-12-24 DIAGNOSIS — F331 Major depressive disorder, recurrent, moderate: Secondary | ICD-10-CM | POA: Diagnosis not present

## 2017-12-24 DIAGNOSIS — M797 Fibromyalgia: Secondary | ICD-10-CM | POA: Insufficient documentation

## 2017-12-24 DIAGNOSIS — Z96659 Presence of unspecified artificial knee joint: Secondary | ICD-10-CM | POA: Diagnosis not present

## 2017-12-24 DIAGNOSIS — G894 Chronic pain syndrome: Secondary | ICD-10-CM

## 2017-12-24 DIAGNOSIS — J45909 Unspecified asthma, uncomplicated: Secondary | ICD-10-CM | POA: Insufficient documentation

## 2017-12-24 DIAGNOSIS — Z8781 Personal history of (healed) traumatic fracture: Secondary | ICD-10-CM | POA: Diagnosis not present

## 2017-12-30 ENCOUNTER — Encounter: Payer: Self-pay | Admitting: Psychology

## 2017-12-30 NOTE — Progress Notes (Signed)
Patient:  Sally [name]   DOB: 10/16/1975  MR Number: [id number]  Location: [facility] FOR PAIN AND REHABILITATIVE MEDICINE [facility] PHYSICAL MEDICINE AND REHABILITATION [place], [address] [id number] [place] [id number] Dept: [phone]  Start: 8 AM End: 9 AM  Provider/Observer:     Edgardo Roys PsyD  Chief Complaint:      Chief Complaint  Patient presents with  . Depression  . Anxiety  . Pain    Reason For Service:     Sally [name] is a 42 year old female referred by [place] for therapeutic interventions due to chronic pain symptoms.  The patient reports that she has great difficulty standing, walking for long times, elbow pain, migraines, hip pain, shoulder and neck pain.  The patient fell at work and fractured both her elbows and also had knee replacement surgery.  The patient has been taking maintenance doses of opioid pain medications for some time.  The patient reports that she has a great deal of difficulty functioning and has very poor sleep and wakes up numerous times each night.  The patient also has dealt with depression and anxiety for many years and has been hospitalized for depression anxiety years ago.  She has been receiving counseling for anxiety and depression and takes Trintellix.  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  The patient describes chronic issues of depression anxiety but the development of severe chronic pain.  The above reason for service has been reviewed for this visit and remains applicable for the current visit.  The patient continues to describe her continued efforts to manage her mood  disorder and actively working on therapeutic interventions being developed.  The patient reports that she continues to have a lot of pain symptoms but that she has been working on her pain management techniques and has better managed her symptoms of depression anxiety.  Interventions Strategy:  Cognitive/behavioral therapeutic interventions along with coping skills and strategies around chronic pain, sleep disturbance and depression. Participation Level:   Active  Participation Quality:  Appropriate and Attentive      Behavioral Observation:  Well Groomed, Alert, and Appropriate.   Current Psychosocial Factors: The patient reports that she is continued to have some stressors with family interactions and being able to do things around the house.  However, she is working on increasing interacting with her family when she can but reports that her pain limits this.  The patient reports that her depression and coping with the symptoms have continued to improve somewhat and she is actively working on therapeutic interventions.  Content of Session:   Reviewed current symptoms and continue to work on therapeutic interventions are and issues related to chronic pain and depression along with fibromyalgia type symptoms as well as potential RSD symptoms in her lower right leg.  Current Status:   The reports continued significant pain and fibromyalgia/RSD type symptoms.  The patient reports that she has continued to work on her sleep hygiene and she feels like her sleep has been improving some but she continues to have to actively work on good sleep patterns.  The patient is also been working on dietary changes and  working on the therapeutic interventions and activities we have been developing.  Patient Progress:   The patient continues to improve her sleep patterns and is been actively working on the therapeutic interventions.  Impression/Diagnosis:   Sally [name] is a 42 year old female referred by [place] for therapeutic interventions due to chronic pain symptoms.  The patient reports that she has great difficulty standing, walking for long times, elbow pain, migraines, hip pain, shoulder and neck pain.  The patient fell at work and fractured both her elbows and also had knee replacement surgery.  The patient has been taking maintenance doses of opioid pain medications for some time.  The patient reports that she has a great deal of difficulty functioning and has very poor sleep and wakes up numerous times each night.  The patient also has dealt with depression and anxiety for many years and has been hospitalized for depression anxiety years ago.  She has been receiving counseling for anxiety and depression and takes Trintellix.  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  The patient describes chronic issues of depression anxiety but the development of severe chronic pain  After a fall at work in 2015 where she fractured both of her elbows, injured her knee and developed fibromyalgia/regional pain syndrome.  The patient reports that she has ongoing difficulties walking distances or standing.  She describes elbow pain, trouble lifting things and trouble writing for any period of time.  The patient describes significant sleep disturbance and is unable to fall asleep at night.  She reports that she is always tired.  The patient describes a normal appetite.  The patient describes memory issues related to trouble remembering things and concentration issues.  The patient reports that she is significantly less active with her children and husband and that her husband has to help more due to her  significant pain.  The above impression/diagnoses remain accurate for the current visit.  I reviewed these for this visit and the patient continues to have significant fibromyalgia and chronic pain symptoms.  Diagnosis:   Chronic pain disorder  Recurrent moderate major depressive disorder with anxiety ([place])

## 2018-01-05 IMAGING — US US TRANSVAGINAL NON-OB
1 series · 13 of 25 positions shown · non-contrast
Comparison: None

CLINICAL DATA: Right lower quadrant pain and back pain for 2 weeks.
Missed normal cycle.

History of tubal ligation and C-section.
Negative urine pregnancy test.  HCG pending.
EXAM:
TRANSABDOMINAL AND TRANSVAGINAL ULTRASOUND OF PELVIS
TECHNIQUE: Both transabdominal and transvaginal ultrasound examinations of the
pelvis were performed. Transabdominal technique was performed for
global imaging of the pelvis including uterus, ovaries, adnexal
regions, and pelvic cul-de-sac. It was necessary to proceed with
endovaginal exam following the transabdominal exam to visualize the
endometrial complex and adnexal regions to an adequate degree.

[Series 1: us transvaginal non-ob · 0.30mm/px · 13 of 81 slices shown]
[im 1/81]
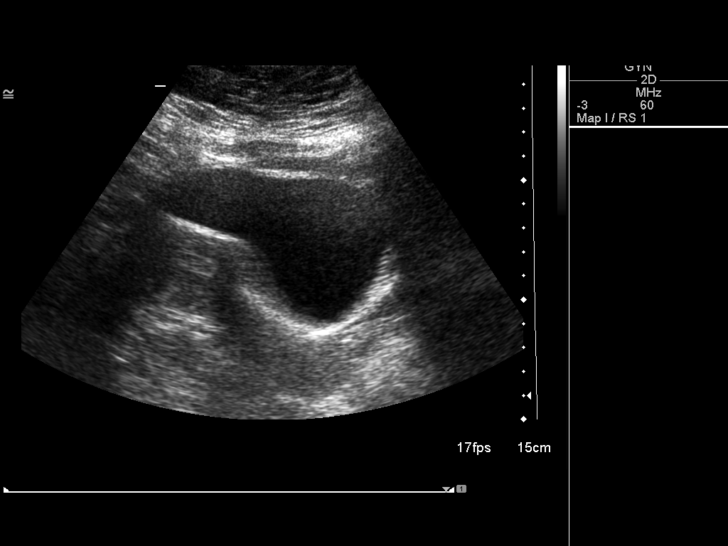
[im 7/81]
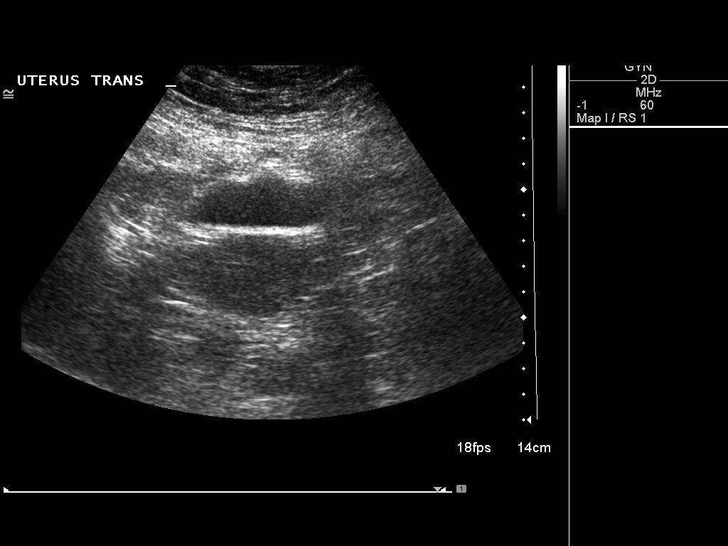
[im 14/81]
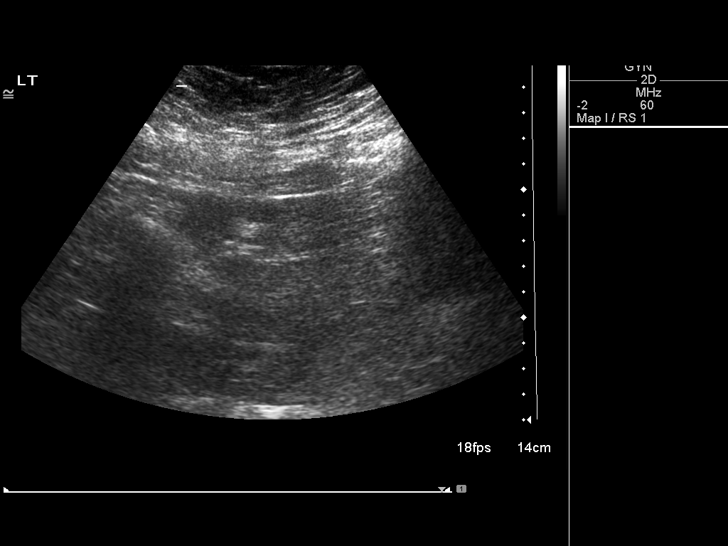
[im 21/81]
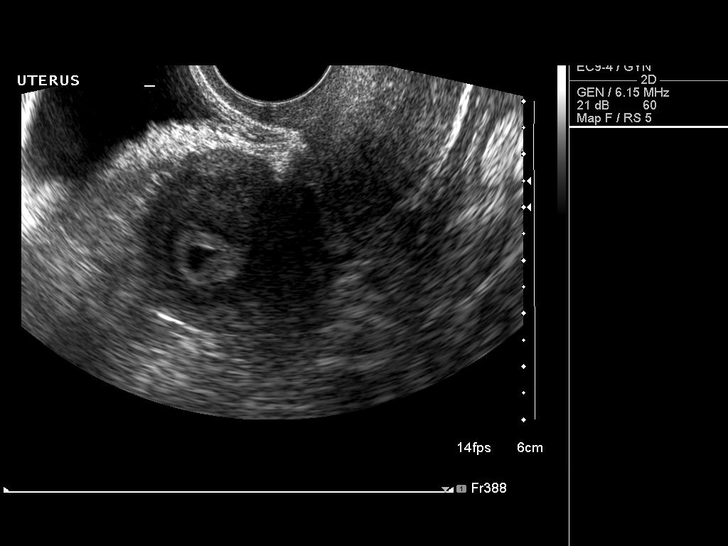
[im 27/81]
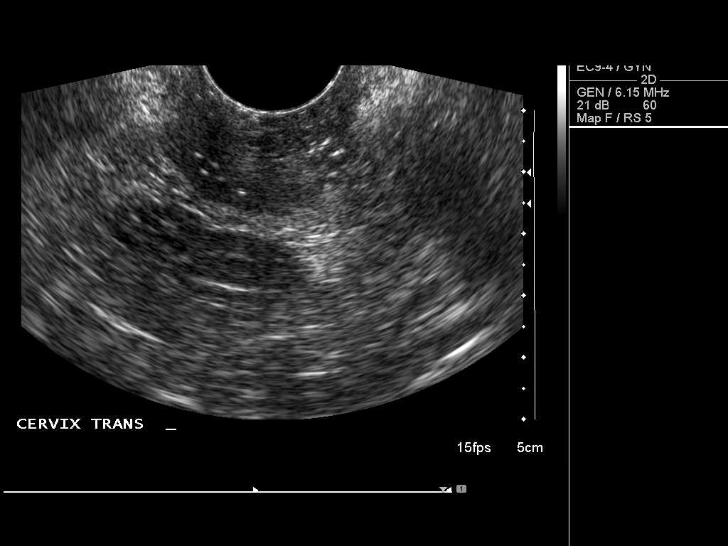
[im 34/81]
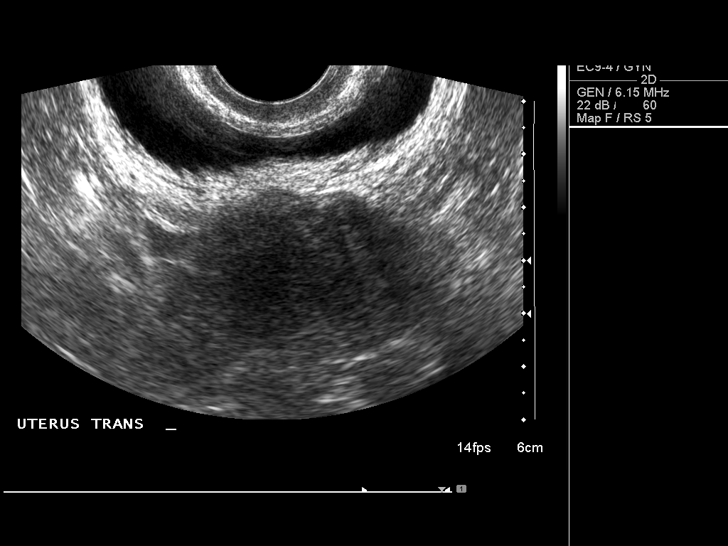
[im 41/81]
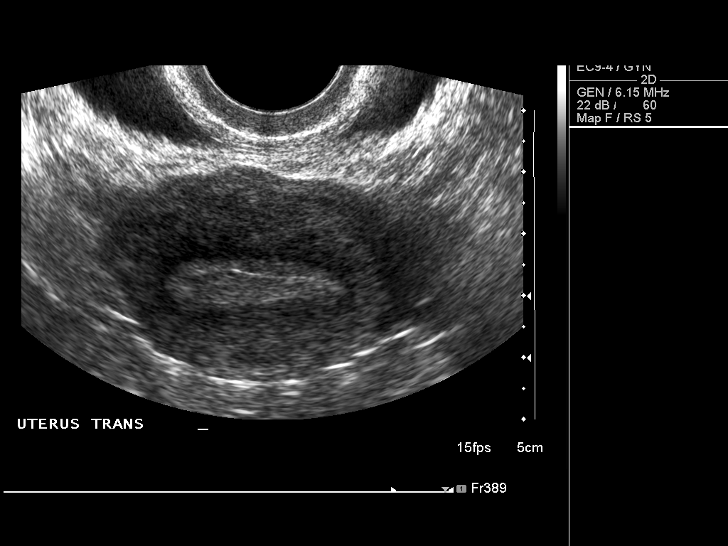
[im 47/81]
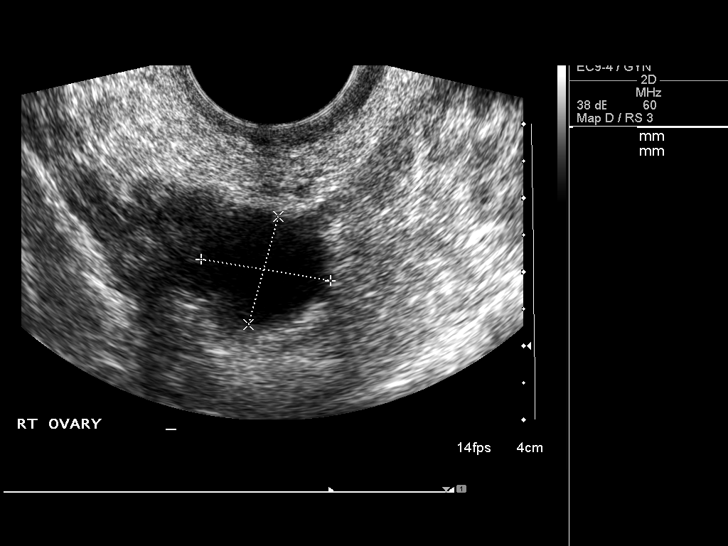
[im 54/81]
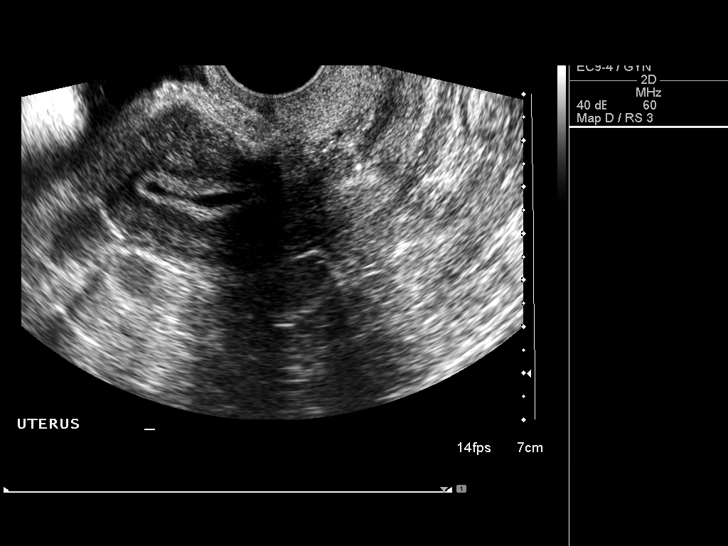
[im 61/81]
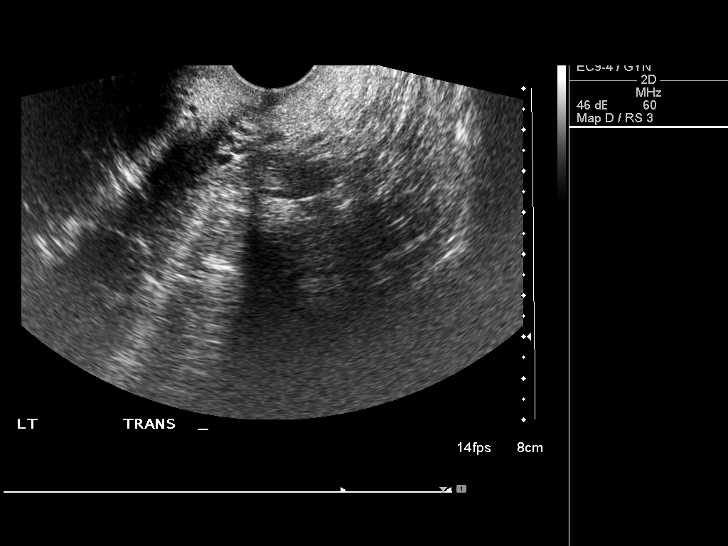
[im 67/81]
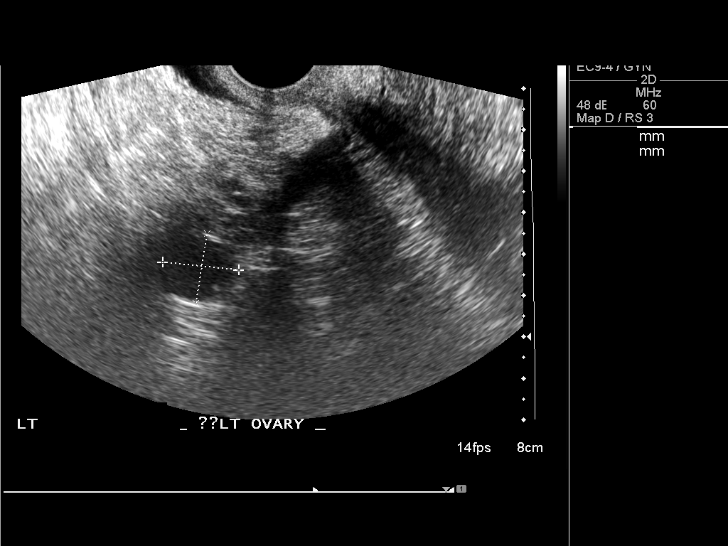
[im 74/81]
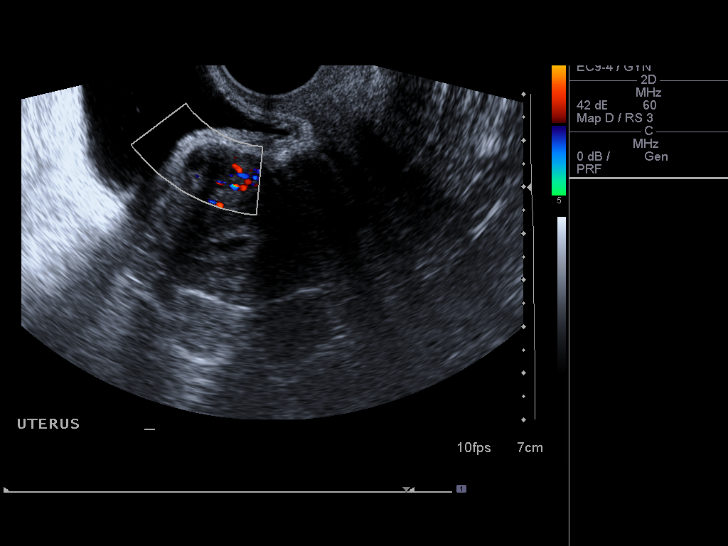
[im 81/81]
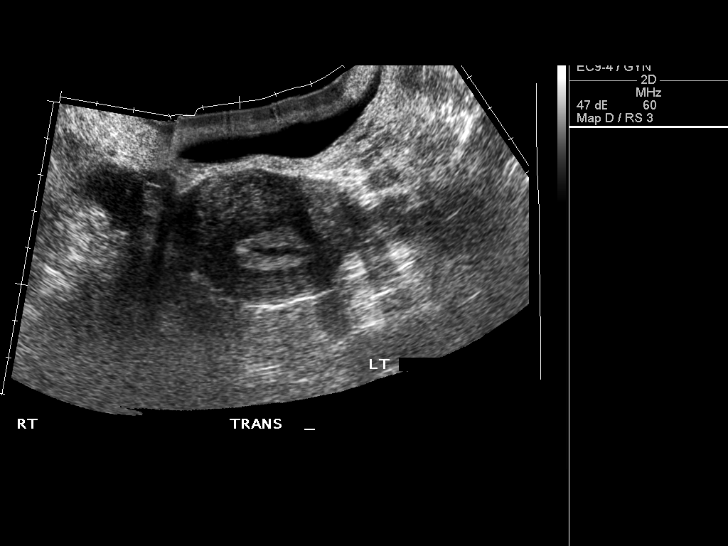

[13 of 25 positions shown; findings below may reference images not displayed]

FINDINGS: Uterus

Measurements: 11 x 4.2 x 4.7 cm.. Exophytic fibroid along the
anterior uterine wall measuring 1.2 x 1.1 cm.

Endometrium

Thickness: Normal at 6 mm. Simple-appearing fluid is seen within the
endometrial canal. No mass or circumscribed fluid collection
identified within the canal. No evidence of intrauterine pregnancy
seen.

Right ovary

Measurements: 3.3 x 2.3 x 2.1 cm.. Right ovarian cyst measuring
x 1.4 cm. Blood flow is shown within the surrounding ovarian
parenchyma. No mass or free fluid within the adjacent right adnexal
region.

Left ovary

Measurements: 3.2 x 1.9 x 3.1 cm. Not well seen due to overlying
bowel gas. No mass or free fluid identified within the adjacent left
adnexal region.

Other findings

Trace free fluid in the cul-de-sac is likely physiologic in nature.
IMPRESSION: 1. Small fibroid exophytic to the anterior uterine wall, measuring
1.2 x 1.1 cm.
2. Fairly large amount of simple-appearing fluid within the
endometrial canal. No mass or circumscribed fluid collection within
the canal. No evidence of intrauterine pregnancy seen.
3. Small right ovarian cyst measuring 1.8 x 1.4 cm, versus normal
dominant follicle. Right ovary appears otherwise normal and there is
no mass or free fluid identified within the right adnexal region.
4. Left ovary not well seen. No obvious mass or free fluid
identified within the left adnexal region.
5. Recommend correlation with beta HCG levels as fluid in the
endometrial canal can be a secondary sign of ectopic pregnancy.
Again, no intrauterine pregnancy identified so ectopic pregnancy
would be a concern if beta HCG was positive.

## 2018-01-15 ENCOUNTER — Encounter: Payer: Worker's Compensation | Attending: Psychology | Admitting: Psychology

## 2018-01-15 DIAGNOSIS — G8929 Other chronic pain: Secondary | ICD-10-CM | POA: Diagnosis present

## 2018-01-15 DIAGNOSIS — F339 Major depressive disorder, recurrent, unspecified: Secondary | ICD-10-CM | POA: Diagnosis not present

## 2018-01-15 DIAGNOSIS — F331 Major depressive disorder, recurrent, moderate: Secondary | ICD-10-CM

## 2018-01-15 DIAGNOSIS — G894 Chronic pain syndrome: Secondary | ICD-10-CM

## 2018-01-15 DIAGNOSIS — F419 Anxiety disorder, unspecified: Secondary | ICD-10-CM | POA: Diagnosis not present

## 2018-01-18 ENCOUNTER — Encounter: Payer: Self-pay | Admitting: Psychology

## 2018-01-18 NOTE — Progress Notes (Signed)
Patient:  Sally [name]   DOB: [birth year]  MR Number: [id number]  Location: [facility] FOR PAIN AND REHABILITATIVE MEDICINE [facility] PHYSICAL MEDICINE AND REHABILITATION [place], [place] [id number] [place] [id number] Dept: [phone]  Start: 11 AM End: 12 PM  Provider/Observer:     Edgardo Roys PsyD  Chief Complaint:      Chief Complaint  Patient presents with  . Depression  . Anxiety  . Pain    Reason For Service:     Sally [name] is a 42 year old female referred by [place] for therapeutic interventions due to chronic pain symptoms.  The patient reports that she has great difficulty standing, walking for long times, elbow pain, migraines, hip pain, shoulder and neck pain.  The patient fell at work and fractured both her elbows and also had knee replacement surgery.  The patient has been taking maintenance doses of opioid pain medications for some time.  The patient reports that she has a great deal of difficulty functioning and has very poor sleep and wakes up numerous times each night.  The patient also has dealt with depression and anxiety for many years and has been hospitalized for depression anxiety years ago.  She has been receiving counseling for anxiety and depression and takes Trintellix.  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  The patient describes chronic issues of depression anxiety but the development of severe chronic pain.  The above reason for service has been reviewed for this visit and remains applicable for the current visit.  The patient reports that she is continued to have an improvement in her  overall mood symptoms as she is actively worked on therapeutic interventions.  However, the patient continues to describe significant pain symptoms that are having a significant deleterious effect on her overall life functioning coping.  She reports that there continues to be days where she has very little activity her interactions with her family due to her pain and depression symptoms.  Interventions Strategy:  Cognitive/behavioral therapeutic interventions along with coping skills and strategies around chronic pain, sleep disturbance and depression.  Participation Level:   Active  Participation Quality:  Appropriate and Attentive      Behavioral Observation:  Well Groomed, Alert, and Appropriate.   Current Psychosocial Factors: The patient reports that the stressors with her family overall have improved.  However, she reports that she has had significant times where her pain of Her from being able to do much around the house or interact with her family.  Content of Session:   Reviewed current symptoms and continue to work on therapeutic interventions around issues related to chronic pain and depression along with fibromyalgia type symptoms as well as potential RSD symptoms in her lower right leg.  Current Status:   The patient continues to report significant pain and fibromyalgia/RSD type symptoms.  The patient reports that she has been actively working on her sleep patterns and her sleep has been improved.  She actively is working on dietary changes and physical interactions as well as trying to work on issues improving her interactions with others.  Patient Progress:   The patient continues to improve  her sleep patterns and is been actively working on the therapeutic interventions.  Impression/Diagnosis:   Sally [name] is a 42 year old female referred by [place] for therapeutic interventions due to chronic pain symptoms.  The patient reports that she has great difficulty standing,  walking for long times, elbow pain, migraines, hip pain, shoulder and neck pain.  The patient fell at work and fractured both her elbows and also had knee replacement surgery.  The patient has been taking maintenance doses of opioid pain medications for some time.  The patient reports that she has a great deal of difficulty functioning and has very poor sleep and wakes up numerous times each night.  The patient also has dealt with depression and anxiety for many years and has been hospitalized for depression anxiety years ago.  She has been receiving counseling for anxiety and depression and takes Trintellix.  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  The patient describes chronic issues of depression anxiety but the development of severe chronic pain  After a fall at work in 2015 where she fractured both of her elbows, injured her knee and developed fibromyalgia/regional pain syndrome.  The patient reports that she has ongoing difficulties walking distances or standing.  She describes elbow pain, trouble lifting things and trouble writing for any period of time.  The patient describes significant sleep disturbance and is unable to fall asleep at night.  She reports that she is always tired.  The patient describes a normal appetite.  The patient describes memory issues related to trouble remembering things and concentration issues.  The patient reports that she is significantly less active with her children and husband and that her husband has to help more due to her significant pain.  The above impression/diagnoses remain accurate for the current visit.  I reviewed these for this visit and the  patient continues to have significant fibromyalgia and chronic pain symptoms.  Diagnosis:   Chronic pain disorder  Recurrent moderate major depressive disorder with anxiety ([place])

## 2018-02-12 ENCOUNTER — Encounter: Payer: Worker's Compensation | Admitting: Psychology

## 2018-02-12 ENCOUNTER — Encounter

## 2018-04-02 ENCOUNTER — Encounter: Payer: No Typology Code available for payment source | Attending: Psychology | Admitting: Psychology

## 2018-04-02 ENCOUNTER — Other Ambulatory Visit: Payer: Self-pay

## 2018-04-02 ENCOUNTER — Encounter

## 2018-04-02 DIAGNOSIS — F331 Major depressive disorder, recurrent, moderate: Secondary | ICD-10-CM

## 2018-04-02 DIAGNOSIS — F329 Major depressive disorder, single episode, unspecified: Secondary | ICD-10-CM | POA: Diagnosis not present

## 2018-04-02 DIAGNOSIS — G8929 Other chronic pain: Secondary | ICD-10-CM | POA: Insufficient documentation

## 2018-04-02 DIAGNOSIS — Z96659 Presence of unspecified artificial knee joint: Secondary | ICD-10-CM | POA: Diagnosis not present

## 2018-04-02 DIAGNOSIS — J45909 Unspecified asthma, uncomplicated: Secondary | ICD-10-CM | POA: Diagnosis not present

## 2018-04-02 DIAGNOSIS — M1990 Unspecified osteoarthritis, unspecified site: Secondary | ICD-10-CM | POA: Insufficient documentation

## 2018-04-02 DIAGNOSIS — G894 Chronic pain syndrome: Secondary | ICD-10-CM

## 2018-04-02 DIAGNOSIS — E039 Hypothyroidism, unspecified: Secondary | ICD-10-CM | POA: Diagnosis not present

## 2018-04-02 DIAGNOSIS — E559 Vitamin D deficiency, unspecified: Secondary | ICD-10-CM | POA: Insufficient documentation

## 2018-04-02 DIAGNOSIS — F419 Anxiety disorder, unspecified: Secondary | ICD-10-CM

## 2018-04-02 DIAGNOSIS — M797 Fibromyalgia: Secondary | ICD-10-CM | POA: Diagnosis not present

## 2018-04-02 DIAGNOSIS — F32A Depression, unspecified: Secondary | ICD-10-CM

## 2018-04-02 DIAGNOSIS — Z8781 Personal history of (healed) traumatic fracture: Secondary | ICD-10-CM | POA: Diagnosis not present

## 2018-04-04 ENCOUNTER — Encounter: Payer: Self-pay | Admitting: Psychology

## 2018-04-04 NOTE — Progress Notes (Signed)
Patient:  Sally [name]   DOB: 11/06/1975  MR Number: [id number]  Location: [facility] FOR PAIN AND REHABILITATIVE MEDICINE [facility] PHYSICAL MEDICINE AND REHABILITATION [place], [place] [id number] [place] [address] Dept: [phone]  Start: 11 AM End: 12 PM  Provider/Observer:     Edgardo Roys PsyD  Chief Complaint:      Chief Complaint  Patient presents with  . Pain  . Depression  . Anxiety    Reason For Service:     Sally [name] is a 43 year old female referred by [place] for therapeutic interventions due to chronic pain symptoms.  The patient reports that she has great difficulty standing, walking for long times, elbow pain, migraines, hip pain, shoulder and neck pain.  The patient fell at work and fractured both her elbows and also had knee replacement surgery.  The patient has been taking maintenance doses of opioid pain medications for some time.  The patient reports that she has a great deal of difficulty functioning and has very poor sleep and wakes up numerous times each night.  The patient also has dealt with depression and anxiety for many years and has been hospitalized for depression anxiety years ago.  She has been receiving counseling for anxiety and depression and takes Trintellix.  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  The patient describes chronic issues of depression anxiety but the development of severe chronic pain.  The above reason for service has been reviewed for this visit and remains applicable for the current visit.  The patient reports that she is continued to experience significant pain  and inflammation.  She reports that she is having more pain going down her left arm than his usual.  The patient reports that her regional pain syndrome/fibromyalgia has been more problematic recently.   Interventions Strategy:  Cognitive/behavioral therapeutic interventions along with coping skills and strategies around chronic pain, sleep disturbance and depression.  Participation Level:   Active  Participation Quality:  Appropriate and Attentive      Behavioral Observation:  Well Groomed, Alert, and Appropriate.   Current Psychosocial Factors: The patient reports that she has been more stressed recently as her pain has been worse over the past few weeks and this is made it even harder for her to do things around the house and help out with her family.  Content of Session:   Reviewed current symptoms and continue to work on therapeutic interventions around issues related to chronic pain and depression along with fibromyalgia type symptoms as well as potential RSD symptoms in her lower right leg.  Current Status:   The patient reports that she is experienced a worsening in her pain symptoms particularly in her left arm and legs.  Patient Progress:   The patient continues to improve her sleep patterns and is been actively working on the therapeutic interventions.  Impression/Diagnosis:   Sally [name] is a 43 year old female referred by [place] for therapeutic interventions due to chronic pain symptoms.  The patient reports that she has great difficulty standing, walking for long times, elbow pain, migraines, hip pain, shoulder and neck pain.  The patient fell at work  and fractured both her elbows and also had knee replacement surgery.  The patient has been taking maintenance doses of opioid pain medications for some time.  The patient reports that she has a great deal of difficulty functioning and has very poor sleep and wakes up numerous times each night.  The patient also has dealt  with depression and anxiety for many years and has been hospitalized for depression anxiety years ago.  She has been receiving counseling for anxiety and depression and takes Trintellix.  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  The patient describes chronic issues of depression anxiety but the development of severe chronic pain  After a fall at work in 2015 where she fractured both of her elbows, injured her knee and developed fibromyalgia/regional pain syndrome.  The patient reports that she has ongoing difficulties walking distances or standing.  She describes elbow pain, trouble lifting things and trouble writing for any period of time.  The patient describes significant sleep disturbance and is unable to fall asleep at night.  She reports that she is always tired.  The patient describes a normal appetite.  The patient describes memory issues related to trouble remembering things and concentration issues.  The patient reports that she is significantly less active with her children and husband and that her husband has to help more due to her significant pain.  The above impression/diagnoses remain accurate for the current visit.  I reviewed these for this visit and the patient continues to have significant fibromyalgia and chronic pain symptoms.  Diagnosis:   Chronic pain disorder  Recurrent moderate major depressive disorder with anxiety (H[facility])  Anxiety and depression

## 2018-04-23 ENCOUNTER — Encounter: Payer: Self-pay | Admitting: Psychology

## 2018-04-23 ENCOUNTER — Other Ambulatory Visit: Payer: Self-pay

## 2018-04-23 ENCOUNTER — Encounter: Payer: No Typology Code available for payment source | Attending: Psychology | Admitting: Psychology

## 2018-04-23 DIAGNOSIS — G90513 Complex regional pain syndrome I of upper limb, bilateral: Secondary | ICD-10-CM | POA: Diagnosis present

## 2018-04-23 DIAGNOSIS — F331 Major depressive disorder, recurrent, moderate: Secondary | ICD-10-CM | POA: Diagnosis not present

## 2018-04-23 DIAGNOSIS — F419 Anxiety disorder, unspecified: Secondary | ICD-10-CM | POA: Diagnosis not present

## 2018-04-23 NOTE — Progress Notes (Signed)
Patient:  Sally [name]   DOB: 1975/07/17  MR Number: [id number]  Location: [facility] FOR PAIN AND REHABILITATIVE MEDICINE [facility] PHYSICAL MEDICINE AND REHABILITATION [place], [place] [id number] [place] [address] Dept: [phone]  Start: 10 AM End: 11 AM  Provider/Observer:     Edgardo Roys PsyD  Chief Complaint:      Chief Complaint  Patient presents with  . Pain  . Depression  . Anxiety    Reason For Service:     Sally [name] is a 43 year old female referred by [place] for therapeutic interventions due to chronic pain symptoms.  The patient reports that she has great difficulty standing, walking for long times, elbow pain, migraines, hip pain, shoulder and neck pain.  The patient fell at work and fractured both her elbows and also had knee replacement surgery.  The patient has been taking maintenance doses of opioid pain medications for some time.  The patient reports that she has a great deal of difficulty functioning and has very poor sleep and wakes up numerous times each night.  The patient also has dealt with depression and anxiety for many years and has been hospitalized for depression anxiety years ago.  She has been receiving counseling for anxiety and depression and takes Trintellix.  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  The patient describes chronic issues of depression anxiety but the development of severe chronic pain.  The above reason for service has been reviewed for this visit and remains applicable for the current visit.  The patient reports that she has had a recent MRI which showed nerve root  impingement at C7.  The patient reports that she continues to have difficulties with pain in her arm as well as numb and tingling sensations in both of her arms.  Her other issues have to do with pain in her leg continue as well as well as issues related to her complex regional pain syndrome and fibromyalgia type symptoms..   Interventions Strategy:  Cognitive/behavioral therapeutic interventions along with coping skills and strategies around chronic pain, sleep disturbance and depression.  Participation Level:   Active  Participation Quality:  Appropriate and Attentive      Behavioral Observation:  Well Groomed, Alert, and Appropriate.   Current Psychosocial Factors: The patient reports that she has been more stressed recently as her pain has been worse over the past few weeks and this is made it even harder for her to do things around the house and help out with her family.  Content of Session:   Reviewed current symptoms and continue to work on therapeutic interventions are and issues related to chronic pain and depression as well as issues around her complex regional pain symptoms and fibromyalgia type symptoms.   Current Status:   The patient reports that she is continuing to experience significant pain symptoms particularly her left arm and legs.  Patient Progress:   The patient reports that she has continued to see some improvements in overall sleep pattern has been actively working on therapeutic interventions.  The patient reports that while she has been experiencing some increases in pain recently she is also  had to significantly increase her physical activity as she is helping to watch her family's children who are essential workers and have to work during the COVID-19 issues but do not have childcare for their children.  The patient reports that while she has had a recent exacerbation in her pain it is not been nearly as bad as she would have expected given her past  history.  Impression/Diagnosis:   Sally [name] is a 43 year old female referred by [place] for therapeutic interventions due to chronic pain symptoms.  The patient reports that she has great difficulty standing, walking for long times, elbow pain, migraines, hip pain, shoulder and neck pain.  The patient fell at work and fractured both her elbows and also had knee replacement surgery.  The patient has been taking maintenance doses of opioid pain medications for some time.  The patient reports that she has a great deal of difficulty functioning and has very poor sleep and wakes up numerous times each night.  The patient also has dealt with depression and anxiety for many years and has been hospitalized for depression anxiety years ago.  She has been receiving counseling for anxiety and depression and takes Trintellix.  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  The patient describes chronic issues of depression anxiety but the development of severe chronic pain  After a fall at work in 2015 where she fractured both of her elbows, injured her knee and developed fibromyalgia/regional pain syndrome.  The patient reports that she has ongoing difficulties walking distances or standing.  She describes elbow pain, trouble lifting things and trouble writing for any period of time.  The patient describes significant sleep disturbance and is unable to fall asleep at night.  She reports that she is always tired.  The patient describes a normal appetite.  The patient describes memory issues related to trouble remembering things and concentration issues.  The patient reports that she is significantly  less active with her children and husband and that her husband has to help more due to her significant pain.  The above impression/diagnoses remain accurate for the current visit.  I reviewed these for this visit and the patient continues to have significant fibromyalgia and chronic pain symptoms.  Diagnosis:   Complex regional pain syndrome type 1 of both upper extremities  Recurrent moderate major depressive disorder with anxiety ([place])

## 2018-06-11 ENCOUNTER — Other Ambulatory Visit: Payer: Self-pay

## 2018-06-11 ENCOUNTER — Encounter: Payer: No Typology Code available for payment source | Attending: Psychology | Admitting: Psychology

## 2018-06-11 DIAGNOSIS — F419 Anxiety disorder, unspecified: Secondary | ICD-10-CM

## 2018-06-11 DIAGNOSIS — F331 Major depressive disorder, recurrent, moderate: Secondary | ICD-10-CM

## 2018-06-11 DIAGNOSIS — G894 Chronic pain syndrome: Secondary | ICD-10-CM | POA: Diagnosis present

## 2018-06-11 DIAGNOSIS — F32A Depression, unspecified: Secondary | ICD-10-CM

## 2018-06-11 DIAGNOSIS — F329 Major depressive disorder, single episode, unspecified: Secondary | ICD-10-CM

## 2018-06-11 DIAGNOSIS — G90513 Complex regional pain syndrome I of upper limb, bilateral: Secondary | ICD-10-CM | POA: Diagnosis present

## 2018-06-17 ENCOUNTER — Encounter: Payer: Self-pay | Admitting: Psychology

## 2018-06-17 NOTE — Progress Notes (Signed)
Patient:  Sally [name]   DOB: [birth year]  MR Number: [id number]  Location: [facility]  PHYSICAL MEDICINE AND REHABILITATION [place], [address] [id number] [facility] [place] [address] Dept: [phone]  Start: 9 AM End: 10 AM  This visit lasted for 1 hour between 9 AM and 10 AM and was an in person visit.  The visit was conducted in my outpatient clinic office with myself present along with the patient.  Provider/Observer:     Edgardo Roys PsyD  Chief Complaint:      Chief Complaint  Patient presents with  . Anxiety  . Depression  . Pain    Reason For Service:     Sally [name] is a 43 year old female referred by [facility] for therapeutic interventions due to chronic pain symptoms.  The patient reports that she has great difficulty standing, walking for long times, elbow pain, migraines, hip pain, shoulder and neck pain.  The patient fell at work and fractured both her elbows and also had knee replacement surgery.  The patient has been taking maintenance doses of opioid pain medications for some time.  The patient reports that she has a great deal of difficulty functioning and has very poor sleep and wakes up numerous times each night.  The patient also has dealt with depression and anxiety for many years and has been hospitalized for depression anxiety years ago.  She has been receiving counseling for anxiety and depression and takes Trintellix.  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  The patient describes chronic issues of depression anxiety but the development of severe chronic  pain.  The above reason for service has been reviewed for this visit and remains applicable for the current visit.  The patient continues to struggle with a nerve impingement at C7.  The patient reports that she continues to have difficulty particularly with leg pain and arm pain and issues related to her complex regional pain syndrome and fibromyalgia type symptoms.  Interventions Strategy:  Cognitive/behavioral therapeutic interventions along with coping skills and strategies around chronic pain, sleep disturbance and depression/anxiety.  Participation Level:   Active  Participation Quality:  Appropriate and Attentive      Behavioral Observation:  Well Groomed, Alert, and Appropriate.   Current Psychosocial Factors: The patient reports that due to COVID-19 restrictions for her greater family that she has been placed in a situation where she is trying to help not only watch her own kids but watch other family members kids at times.  She reports that this is very difficult for her due to her severe pain symptoms and issues around motivation etc.  The patient reports that she is continued to struggle with these types of basic day-to-day activities.  Content of Session:   Reviewed current symptoms and continue to work on therapeutic interventions around issues related to her chronic pain and depression as well as issues related to her chronic complex regional pain syndrome and fibromyalgia symptoms.   Current Status:   The patient continues to have significant pain in her left leg and both of her arms.  The patient reports that this is been  having a significant negative impact on her day-to-day functioning.  Patient Progress:   The patient reports that she has been able to improve her overall sleep pattern and her depressive symptoms have been improving although with the recent changes around her family related to COVID-19 she is having to do more childcare types of activities which are very  difficult for her.  Impression/Diagnosis:   Sally [name] is a 43 year old female referred by [facility] for therapeutic interventions due to chronic pain symptoms.  The patient reports that she has great difficulty standing, walking for long times, elbow pain, migraines, hip pain, shoulder and neck pain.  The patient fell at work and fractured both her elbows and also had knee replacement surgery.  The patient has been taking maintenance doses of opioid pain medications for some time.  The patient reports that she has a great deal of difficulty functioning and has very poor sleep and wakes up numerous times each night.  The patient also has dealt with depression and anxiety for many years and has been hospitalized for depression anxiety years ago.  She has been receiving counseling for anxiety and depression and takes Trintellix.  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  The patient describes chronic issues of depression anxiety but the development of severe chronic pain  After a fall at work in 2015 where she fractured both of her elbows, injured her knee and developed fibromyalgia/regional pain syndrome.  The patient reports that she has ongoing difficulties walking distances or standing.  She describes elbow pain, trouble lifting things and trouble writing for any period of time.  The patient describes significant sleep disturbance and is unable to fall asleep at night.  She reports that she is always tired.  The patient describes a normal appetite.  The patient describes memory issues related to trouble remembering things and concentration issues.  The patient reports that she is  significantly less active with her children and husband and that her husband has to help more due to her significant pain.  The above impression/diagnoses remain accurate for the current visit.  I reviewed these for this visit and the patient continues to have significant fibromyalgia and chronic pain symptoms.  Diagnosis:   Complex regional pain syndrome type 1 of both upper extremities  Recurrent moderate major depressive disorder with anxiety (HCC)  Chronic pain disorder  Anxiety and depression

## 2018-07-05 ENCOUNTER — Ambulatory Visit: Payer: Self-pay | Admitting: Psychology

## 2018-07-26 ENCOUNTER — Other Ambulatory Visit: Payer: Self-pay

## 2018-07-26 ENCOUNTER — Encounter: Payer: No Typology Code available for payment source | Attending: Psychology | Admitting: Psychology

## 2018-07-26 DIAGNOSIS — F329 Major depressive disorder, single episode, unspecified: Secondary | ICD-10-CM | POA: Diagnosis present

## 2018-07-26 DIAGNOSIS — M5440 Lumbago with sciatica, unspecified side: Secondary | ICD-10-CM | POA: Diagnosis not present

## 2018-07-26 DIAGNOSIS — F419 Anxiety disorder, unspecified: Secondary | ICD-10-CM | POA: Insufficient documentation

## 2018-07-26 DIAGNOSIS — M797 Fibromyalgia: Secondary | ICD-10-CM | POA: Diagnosis not present

## 2018-07-26 DIAGNOSIS — F32A Depression, unspecified: Secondary | ICD-10-CM

## 2018-07-27 ENCOUNTER — Encounter: Payer: Self-pay | Admitting: Psychology

## 2018-07-27 NOTE — Progress Notes (Signed)
Patient:  Sally [name]   DOB: August 05, 1975  MR Number: [id number]  Location: [facility] [place] PHYSICAL MEDICINE AND REHABILITATION Sally [name], [address] [id number] [place] [address] Dept: [phone]  Start: 1 PM End: 2 PM  This visit lasted for 1 hour and was between 1 PM and 2 PM and was an in person visit.  The visit was conducted in my outpatient clinic office with myself and the patient present.   Provider/Observer:     Edgardo Roys PsyD  Chief Complaint:      Chief Complaint  Patient presents with  . Anxiety  . Depression  . Pain    Reason For Service:     Sally [name] is a 43 year old female referred by [facility] for therapeutic interventions due to chronic pain symptoms.  The patient reports that she has great difficulty standing, walking for long times, elbow pain, migraines, hip pain, shoulder and neck pain.  The patient fell at work and fractured both her elbows and also had knee replacement surgery.  The patient has been taking maintenance doses of opioid pain medications for some time.  The patient reports that she has a great deal of difficulty functioning and has very poor sleep and wakes up numerous times each night.  The patient also has dealt with depression and anxiety for many years and has been hospitalized for depression anxiety years ago.  She has been receiving counseling for anxiety and depression and takes Trintellix.  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  The patient describes chronic issues of depression anxiety but the development of severe chronic  pain.  The above reason for service has been reviewed for this visit and remains applicable for the current visit.  The patient continues to struggle with a nerve impingement at C7.  The patient reports that she continues to have difficulty particularly with leg pain and arm pain and issues related to her complex regional pain syndrome and fibromyalgia type symptoms.  Interventions Strategy:  Cognitive/behavioral therapeutic interventions along with coping skills and strategies around chronic pain, sleep disturbance and depression/anxiety.  Participation Level:   Active  Participation Quality:  Appropriate and Attentive      Behavioral Observation:  Well Groomed, Alert, and Appropriate.   Current Psychosocial Factors: The patient reports that due to COVID-19 restrictions for her greater family that she has been placed in a situation where she is trying to help not only watch her own kids but watch other family members kids at times.  She reports that this is very difficult for her due to her severe pain symptoms and issues around motivation etc.  The patient reports that she is continued to struggle with these types of basic day-to-day activities.  Content of Session:   Reviewed current symptoms and continue to work on therapeutic interventions around issues related to her chronic pain and depression as well as issues related to her chronic complex regional pain syndrome and fibromyalgia symptoms.   Current Status:   The patient continues to have significant pain in her left leg and both of her arms.  The patient reports that this  is been having a significant negative impact on her day-to-day functioning.  Patient Progress:   The patient reports that she has been able to improve her overall sleep pattern and her depressive symptoms have been improving although with the recent changes around her family related to COVID-19 she is having to do more childcare types of activities which are very  difficult for her.  Impression/Diagnosis:   Sally [name] is a 43 year old female referred by [facility] for therapeutic interventions due to chronic pain symptoms.  The patient reports that she has great difficulty standing, walking for long times, elbow pain, migraines, hip pain, shoulder and neck pain.  The patient fell at work and fractured both her elbows and also had knee replacement surgery.  The patient has been taking maintenance doses of opioid pain medications for some time.  The patient reports that she has a great deal of difficulty functioning and has very poor sleep and wakes up numerous times each night.  The patient also has dealt with depression and anxiety for many years and has been hospitalized for depression anxiety years ago.  She has been receiving counseling for anxiety and depression and takes Trintellix.  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  The patient describes chronic issues of depression anxiety but the development of severe chronic pain  After a fall at work in 2015 where she fractured both of her elbows, injured her knee and developed fibromyalgia/regional pain syndrome.  The patient reports that she has ongoing difficulties walking distances or standing.  She describes elbow pain, trouble lifting things and trouble writing for any period of time.  The patient describes significant sleep disturbance and is unable to fall asleep at night.  She reports that she is always tired.  The patient describes a normal appetite.  The patient describes memory issues related to trouble remembering things and concentration issues.  The patient reports that she is  significantly less active with her children and husband and that her husband has to help more due to her significant pain.  The above impression/diagnoses remain accurate for the current visit.  I reviewed these for this visit and the patient continues to have significant fibromyalgia and chronic pain symptoms.  Diagnosis: Fibromyalgia, anxiety and depression, neck, shoulder and back pain.

## 2018-08-31 ENCOUNTER — Other Ambulatory Visit: Payer: Self-pay

## 2018-08-31 ENCOUNTER — Encounter: Payer: No Typology Code available for payment source | Attending: Psychology | Admitting: Psychology

## 2018-08-31 DIAGNOSIS — M797 Fibromyalgia: Secondary | ICD-10-CM | POA: Insufficient documentation

## 2018-08-31 DIAGNOSIS — F32A Depression, unspecified: Secondary | ICD-10-CM

## 2018-08-31 DIAGNOSIS — F331 Major depressive disorder, recurrent, moderate: Secondary | ICD-10-CM | POA: Insufficient documentation

## 2018-08-31 DIAGNOSIS — F419 Anxiety disorder, unspecified: Secondary | ICD-10-CM

## 2018-08-31 DIAGNOSIS — G894 Chronic pain syndrome: Secondary | ICD-10-CM | POA: Diagnosis present

## 2018-08-31 DIAGNOSIS — G90513 Complex regional pain syndrome I of upper limb, bilateral: Secondary | ICD-10-CM | POA: Insufficient documentation

## 2018-08-31 DIAGNOSIS — F329 Major depressive disorder, single episode, unspecified: Secondary | ICD-10-CM | POA: Diagnosis present

## 2018-09-02 ENCOUNTER — Encounter: Payer: Self-pay | Admitting: Psychology

## 2018-09-02 NOTE — Progress Notes (Signed)
Patient:  Sally [name]   DOB: Jun 13, 1975  MR Number: [id number]  Location: [facility] [place] PHYSICAL MEDICINE AND REHABILITATION [place], [address] [id number] [facility] [place] [address] Dept: [phone]  Start: 8 AM End: 9 AM  This visit lasted for 1 hour and was between 8 AM and 9 AM and was an in person visit.  The visit was conducted in my outpatient clinic office with myself and the patient present.   Provider/Observer:     Edgardo Roys PsyD  Chief Complaint:      Chief Complaint  Patient presents with  . Anxiety  . Depression  . Pain    Reason For Service:     Sally [name] is a 43 year old female referred by [facility] for therapeutic interventions due to chronic pain symptoms.  The patient reports that she has great difficulty standing, walking for long times, elbow pain, migraines, hip pain, shoulder and neck pain.  The patient fell at work and fractured both her elbows and also had knee replacement surgery.  The patient has been taking maintenance doses of opioid pain medications for some time.  The patient reports that she has a great deal of difficulty functioning and has very poor sleep and wakes up numerous times each night.  The patient also has dealt with depression and anxiety for many years and has been hospitalized for depression anxiety years ago.  She has been receiving counseling for anxiety and depression and takes Trintellix.  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  The patient describes chronic issues of depression anxiety but the development of severe chronic  pain.  The above reason for service was reviewed for this visit and remains applicable for the current visit.  The patient reports that while she is continued to struggle with pain particularly with pain related to nerve root impingement at C7 the patient reports that she has had some improvement in her leg pain but continues with her complex regional pain syndrome and fibromyalgia type symptoms.  Interventions Strategy:  Cognitive/behavioral therapeutic interventions along with coping skills and strategies around chronic pain, sleep disturbance and depression/anxiety.  Participation Level:   Active  Participation Quality:  Appropriate and Attentive      Behavioral Observation:  Well Groomed, Alert, and Appropriate.   Current Psychosocial Factors: The patient reports that her anxiety and depression have stabilized for the most part and she is coping adequately with her significant pain.  However, this pain level and physical difficulties are having a significant deleterious effect on her ability to manage and cope with her life.  The patient is not able to work now and is had difficulty even trying to take care of and help with childcare for her sister's children.  This had to be discontinued.   Content of Session:   Reviewed current symptoms and continue to work on therapeutic interventions around issues related to her chronic pain and depression as well as issues related to her chronic complex regional pain syndrome and fibromyalgia symptoms.   Current Status:   The patient continues to have significant pain in her left leg and both of  her arms.  The patient reports that this is been having a significant negative impact on her day-to-day functioning.  Patient Progress:   The patient reports that she has been able to improve her overall sleep pattern and her depressive symptoms have been improving although with the recent changes around her family related to COVID-19 she is having to do more  childcare types of activities which are very difficult for her.  Impression/Diagnosis:   Sally [name] is a 43 year old female referred by [facility] for therapeutic interventions due to chronic pain symptoms.  The patient reports that she has great difficulty standing, walking for long times, elbow pain, migraines, hip pain, shoulder and neck pain.  The patient fell at work and fractured both her elbows and also had knee replacement surgery.  The patient has been taking maintenance doses of opioid pain medications for some time.  The patient reports that she has a great deal of difficulty functioning and has very poor sleep and wakes up numerous times each night.  The patient also has dealt with depression and anxiety for many years and has been hospitalized for depression anxiety years ago.  She has been receiving counseling for anxiety and depression and takes Trintellix.  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  The patient describes chronic issues of depression anxiety but the development of severe chronic pain  After a fall at work in 2015 where she fractured both of her elbows, injured her knee and developed fibromyalgia/regional pain syndrome.  The patient reports that she has ongoing difficulties walking distances or standing.  She describes elbow pain, trouble lifting things and trouble writing for any period of time.  The patient describes significant sleep disturbance and is unable to fall asleep at night.  She reports that she is always tired.  The patient describes a normal appetite.  The patient describes memory issues related to trouble remembering things and concentration  issues.  The patient reports that she is significantly less active with her children and husband and that her husband has to help more due to her significant pain.  The above impression/diagnoses remain accurate for the current visit.  I reviewed these for this visit and the patient continues to have significant fibromyalgia and chronic pain symptoms.  Diagnosis: Fibromyalgia, anxiety and depression, neck, shoulder and back pain.

## 2018-09-08 ENCOUNTER — Telehealth: Payer: Self-pay | Admitting: *Deleted

## 2018-09-08 NOTE — Telephone Encounter (Signed)
Call to patient. Reports gap in care due to loss of insurance. Reports pain with intercourse for "6 or 7 months."  Was tolerable but has progressively increased.  Pain with penetration and "hitting my cervix."  Denies bleeding. [place] 2017.  Appointment scheduled for 09-13-2018.  Call back for any changes.   Routing to Dr Sabra Heck. Encounter closed.

## 2018-09-08 NOTE — Telephone Encounter (Signed)
Patient is having pain during intercourse. Last seen 08/21/16.

## 2018-09-09 ENCOUNTER — Other Ambulatory Visit: Payer: Self-pay

## 2018-09-13 ENCOUNTER — Encounter: Payer: Self-pay | Admitting: Obstetrics & Gynecology

## 2018-09-13 ENCOUNTER — Ambulatory Visit (INDEPENDENT_AMBULATORY_CARE_PROVIDER_SITE_OTHER): Payer: Medicare Other | Admitting: Obstetrics & Gynecology

## 2018-09-13 ENCOUNTER — Ambulatory Visit: Payer: Medicare Other | Admitting: Obstetrics & Gynecology

## 2018-09-13 ENCOUNTER — Other Ambulatory Visit: Payer: Self-pay

## 2018-09-13 ENCOUNTER — Telehealth: Payer: Self-pay | Admitting: Obstetrics & Gynecology

## 2018-09-13 VITALS — BP 128/84 | HR 72 | Temp 97.6°F | Ht 68.0 in | Wt 244.0 lb

## 2018-09-13 DIAGNOSIS — N941 Unspecified dyspareunia: Secondary | ICD-10-CM

## 2018-09-13 LAB — POCT URINALYSIS DIPSTICK
Bilirubin, UA: NEGATIVE
Blood, UA: NEGATIVE
Glucose, UA: NEGATIVE
Ketones, UA: NEGATIVE
Leukocytes, UA: NEGATIVE
Nitrite, UA: NEGATIVE
Protein, UA: NEGATIVE
Urobilinogen, UA: 0.2 E.U./dL
pH, UA: 5 (ref 5.0–8.0)

## 2018-09-13 MED ORDER — PREMARIN 0.625 MG/GM VA CREA: TOPICAL_CREAM | VAGINAL | 1 refills | Status: DC

## 2018-09-13 MED ORDER — ESTRADIOL 0.1 MG/GM VA CREA: TOPICAL_CREAM | VAGINAL | 1 refills | Status: DC

## 2018-09-13 NOTE — Telephone Encounter (Signed)
Call to patient. Patient states even with her insurance, the vaginal estrogen cream was going to cost her $250. Patient requesting an alternative. RN advised would review with Dr. Sabra Heck and return call with recommendations. Patient agreeable.   Routing to provider to review and advise on prescription.

## 2018-09-13 NOTE — Progress Notes (Signed)
GYNECOLOGY  VISIT  CC:   Pain with intercourse getting worse  HPI: 43 y.o. G80P2002 Married White or Caucasian female here for pain with intercourse that has been going on for about 6-8 months.  She has tried a lubricant but this hasn't helped much.  She does have some vaginal dryness as well.  Denies vaginal bleeding.  She is having some hot flashes.  She just saw Simona Huh at [facility].  She is using topical testosterone for low testosterone.  Had STD testing and hormonal testing.  FSH was 12.  GYNECOLOGIC HISTORY: Patient's last menstrual period was 10/26/2015 (exact date). Contraception: hysterectomy  Menopausal hormone therapy: none  Patient Active Problem List   Diagnosis Date Noted  . HTN (hypertension) 09/18/2016  . Physical exam 09/03/2016  . Status post total right knee replacement 08/20/2016  . Migraines 03/28/2016  . Breast pain in female 01/28/2016  . Obesity (BMI 30-39.9) 01/07/2016  . Fibromyalgia 01/07/2016  . Smoker 06/16/2015  . Anxiety and depression 04/16/2015  . Fecal incontinence 10/09/2013  . Dyspareunia 10/09/2013  . Stress incontinence 09/24/2013  . Hypothyroid 09/09/2012  . Back pain 09/09/2012  . 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 Pelvic pain in female 08/19/2010    Past Medical History:  Diagnosis Date  . Acute appendicitis 09/09/2012  . Anxiety   . Asthma   . Back pain    Occ. issues wih back pain  . Back pain 09/09/2012   It is intermittent now,  previously source of fibromyalgia dx.    . Fibromyalgia   . Headache   . Hypothyroid 09/09/2012  . Hypothyroidism   . Osteoarthritis   . Precancerous changes of the cervix   . Sleep apnea    pre lap banding-never used cpap or mask  . Vitamin D deficiency     Past Surgical History:  Procedure Laterality Date  . APPENDECTOMY    . BILATERAL SALPINGECTOMY Bilateral 11/05/2015   Procedure: BILATERAL SALPINGECTOMY;  Surgeon: [name], MD;  Location: [facility];  Service:  Gynecology;  Laterality: Bilateral;  . BRAIN SURGERY  1992   remove blood clot after a fall  . CESAREAN SECTION  1997, 2013  . CYSTOSCOPY N/A 11/05/2015   Procedure: CYSTOSCOPY;  Surgeon: [name], MD;  Location: [facility];  Service: Gynecology;  Laterality: N/A;  . GASTRIC BANDING PORT REVISION  01/20/2012   Procedure: GASTRIC BANDING PORT REVISION;  Surgeon: Pedro Earls, MD;  Location: [facility];  Service: General;  Laterality: N/A;  . KNEE ARTHROSCOPY WITH DRILLING/MICROFRACTURE Right 12/06/2014   Procedure: KNEE ARTHROSCOPY WITH DRILLING/MICROFRACTURE, CHONDROPLASTY, EXCISION OF PLICA;  Surgeon: Dorna Leitz, MD;  Location: [facility];  Service: Orthopedics;  Laterality: Right;  . KNEE SURGERY    . KNEE SURGERY  06/26/2016   [facility]  . LAPAROSCOPIC APPENDECTOMY N/A 09/09/2012   Procedure: APPENDECTOMY LAPAROSCOPIC;  Surgeon: Merrie Roof, MD;  Location: [facility];  Service: General;  Laterality: N/A;  . [place]  . LAPAROSCOPIC HYSTERECTOMY N/A 11/05/2015   Procedure: HYSTERECTOMY TOTAL LAPAROSCOPIC;  Surgeon: [name], MD;  Location: [facility];  Service: Gynecology;  Laterality: N/A;  . LAPAROSCOPY  08/19/2010   Procedure: LAPAROSCOPY OPERATIVE;  Surgeon: Felipa Emory;  Location: [facility];  Service: Gynecology;  Laterality: N/A;  with Biopsy of uterine serosa  . TUBAL LIGATION     8/13    MEDS:   Current Outpatient Medications on  File Prior to Visit  Medication Sig Dispense Refill  . gabapentin (NEURONTIN) 300 MG capsule Take 300 mg by mouth 3 (three) times daily.    Marland Kitchen ketorolac (TORADOL) 10 MG tablet daily as needed.    Marland Kitchen levothyroxine (SYNTHROID, LEVOTHROID) 125 MCG tablet Take 1 tablet (125 mcg total) by mouth daily. 30 tablet 3  . liothyronine (CYTOMEL) 5 MCG tablet Take 3 tablets (15 mcg total) by mouth daily. Take 2 tablets at breakfast and take one tablet at lunch (Patient taking differently: Take 5 mcg by  mouth 3 (three) times daily. ) 270 tablet 1  . loratadine (CLARITIN) 10 MG tablet Take 10 mg by mouth daily as needed for allergies.    . Oxycodone HCl 10 MG TABS Take 10 mg by mouth 4 (four) times daily as needed for pain.  0  . sertraline (ZOLOFT) 50 MG tablet Take 1 tablet by mouth daily.    . SUMAtriptan (IMITREX) 25 MG tablet TAKE 1 TABLET BY MOUTH AS NEEDED. TAKE AT FIRST SIGN OF MIGRAINE. MAY TAKE SECOND TABLET IF INEFFECTIVE AFTER 1 2 HOURS    . Testosterone 10 MG/ACT (2%) GEL daily.    Marland Kitchen topiramate (TOPAMAX) 50 MG tablet Take 1 tablet by mouth daily.    . Vitamin D, Ergocalciferol, (DRISDOL) 50000 units CAPS capsule TAKE ONE CAPSULE EVERY 7 DAYS 12 capsule 4   No current facility-administered medications on file prior to visit.     ALLERGIES: Paroxetine hcl, Duloxetine, Percocet [oxycodone-acetaminophen], and Codeine  Family History  Problem Relation Age of Onset  . Hypertension Maternal Grandfather   . Diabetes Paternal Grandmother   . Anesthesia problems Neg Hx     SH:  Married, non smoker  Review of Systems  Genitourinary: Positive for dyspareunia.  All other systems reviewed and are negative.   PHYSICAL EXAMINATION:    BP 128/84   Pulse 72   Temp 97.6 F (36.4 C) (Temporal)   Ht 5\' 8"  (1.727 m)   Wt 244 lb (110.7 kg)   LMP 10/26/2015 (Exact Date)   BMI 37.10 kg/m     General appearance: alert, cooperative and appears stated age Lymph:  no inguinal LAD noted  Pelvic: External genitalia:  no lesions              Urethra:  normal appearing urethra with no masses, tenderness or lesions              Bartholins and Skenes: normal                 Vagina: normal appearing vagina with normal color and discharge, no lesions              Cervix: absent              Bimanual Exam:  Uterus:  uterus absent              Adnexa: no mass, fullness, tenderness              Anus:  normal sphincter tone, no lesions  Chaperone was present for  exam.  Assessment: Dyspareunia  Plan: She will start on vaginal estrogen cream, estrace 1 gram pv twice weekly. She was referred to PT in the past but never went due to pelvic floor weakness and pain.  May need this again if estrogen cream does not help.  Advised pt to give update in 2-3 months.

## 2018-09-13 NOTE — Telephone Encounter (Signed)
Patient is calling to request a prescription for an alternative for estradiol 0.1MG  vaginal cream due to cost.

## 2018-09-13 NOTE — Telephone Encounter (Signed)
New rx for premarin vaginal cream sent to pharmacy on file.  There is a premarin vaginal cream coupon that I will bring to you.  Thanks.

## 2018-09-13 NOTE — Telephone Encounter (Signed)
Call to patient. Patient advised of message as seen below from Dr. Sabra Heck. Patient verbalized understanding. States the pharmacy has already texted her about a medication with a $0 copay. RN provided Premarin vaginal cream savings card information just in case. Patient verbalized understanding and appreciative of phone call.   Routing to provider and will close encounter.

## 2018-09-16 ENCOUNTER — Telehealth: Payer: Self-pay | Admitting: *Deleted

## 2018-09-16 NOTE — Telephone Encounter (Signed)
[facility]coming [facility] stating, "premarin too expensive can you change to estradiol cream?"  Call to patient. Patient states when she went to pick up the premarin cream, her insurance wouldn't allow her to use the premarin coupon provided to her along with her insurance. Patient states her out of pocket cost with insurance for the cream was going to be $142. Patient states she was unaware pharmacy was going to request the Estradiol cream which was originally sent in. Patient states that was expensive too at $244. Patient asking again for alternative. RN advised would review with Dr. Sabra Heck and return call. Patient agreeable.   Routing to provider for review.

## 2018-09-17 NOTE — Telephone Encounter (Signed)
[facility]  It is about $50 for three months but it is not covered by insurance as it is a compounded medication.  This is on [employer].  Is this a possibility for her?

## 2018-09-20 NOTE — Telephone Encounter (Signed)
Message left to return call to Triage Nurse at [phone].    

## 2018-09-22 NOTE — Telephone Encounter (Signed)
Call to patient. Advised of message as seen below from Dr. Sabra Heck. Patient states she is familiar with [facility] as that is where she has her testosterone gel filled. Patient states she is okay for Dr. Sabra Heck to send in compound prescription. RN advised would update Dr. Sabra Heck. Patient agreeable.   Routing to provider for review.

## 2018-09-29 MED ORDER — NONFORMULARY OR COMPOUNDED ITEM: 3 refills | Status: DC

## 2018-09-29 NOTE — Telephone Encounter (Signed)
Order written and signed.  Elie Goody will fax.  Ok to close encounter.

## 2018-09-30 NOTE — Telephone Encounter (Signed)
Rx faxed to [facility]

## 2018-10-05 ENCOUNTER — Other Ambulatory Visit: Payer: Self-pay

## 2018-10-05 ENCOUNTER — Encounter: Payer: No Typology Code available for payment source | Attending: Psychology | Admitting: Psychology

## 2018-10-05 DIAGNOSIS — G90513 Complex regional pain syndrome I of upper limb, bilateral: Secondary | ICD-10-CM | POA: Diagnosis present

## 2018-10-05 DIAGNOSIS — F329 Major depressive disorder, single episode, unspecified: Secondary | ICD-10-CM | POA: Insufficient documentation

## 2018-10-05 DIAGNOSIS — F32A Depression, unspecified: Secondary | ICD-10-CM

## 2018-10-05 DIAGNOSIS — F419 Anxiety disorder, unspecified: Secondary | ICD-10-CM

## 2018-10-05 DIAGNOSIS — M797 Fibromyalgia: Secondary | ICD-10-CM | POA: Insufficient documentation

## 2018-10-08 ENCOUNTER — Ambulatory Visit: Payer: Self-pay | Admitting: Orthopedic Surgery

## 2018-10-15 ENCOUNTER — Encounter: Payer: Self-pay | Admitting: Psychology

## 2018-10-15 NOTE — Progress Notes (Signed)
Patient:  Sally [name]   DOB: 1975-11-21  MR Number: [id number]  Location: [facility] [place], [address] [id number] [place] [id number] Dept: [phone]  Start: 9 AM End: 10 AM  This visit was an in person visit that was conducted in my outpatient clinic office with the patient myself present.  It lasted for 1 hour.   Provider/Observer:     Edgardo Roys PsyD  Chief Complaint:      Chief Complaint  Patient presents with  . Anxiety  . Depression  . Pain    Reason For Service:     Sally [name] is a 43 year old female referred by [facility] for therapeutic interventions due to chronic pain symptoms.  The patient reports that she has great difficulty standing, walking for long times, elbow pain, migraines, hip pain, shoulder and neck pain.  The patient fell at work and fractured both her elbows and also had knee replacement surgery.  The patient has been taking maintenance doses of opioid pain medications for some time.  The patient reports that she has a great deal of difficulty functioning and has very poor sleep and wakes up numerous times each night.  The patient also has dealt with depression and anxiety for many years and has been hospitalized for depression anxiety years ago.  She has been receiving counseling for anxiety and depression and takes Trintellix.  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  The patient describes chronic issues of depression anxiety but the development of severe chronic pain.   The above reason for service was  reviewed for this visit remains applicable.  The patient reports that she is continuing to have significant pain associated with nerve root impingement in her neck and continues to have symptoms related to chronic complex regional pain syndrome and fibromyalgia type symptoms.  Interventions Strategy:  Cognitive/behavioral therapeutic interventions along with coping skills and strategies around chronic pain, sleep disturbance and depression/anxiety.  Participation Level:   Active  Participation Quality:  Appropriate and Attentive      Behavioral Observation:  Well Groomed, Alert, and Appropriate.   Current Psychosocial Factors: The patient reports that her anxiety and depression have stabilized for the most part and she is coping adequately with her significant pain.  However, this pain level and physical difficulties are having a significant deleterious effect on her ability to manage and cope with her life.  The patient is not able to work now and is had difficulty even trying to take care of and help with childcare for her sister's children.  This had to be discontinued.   Content of Session:   Reviewed current symptoms and continue to work on therapeutic interventions around issues related to her chronic pain and depression as well as issues related to her chronic complex regional pain syndrome and fibromyalgia symptoms.   Current Status:   The patient continues to have significant pain in her left leg and both of her arms.  The patient reports that this is been having a significant negative impact on her day-to-day functioning.  Patient Progress:   The patient  reports that she has been able to improve her overall sleep pattern and her depressive symptoms have been improving although with the recent changes around her family related to COVID-19 she is having to do more childcare types of activities which are very difficult for her.  Impression/Diagnosis:   Sally [name] is a 43 year old  female referred by [facility] for therapeutic interventions due to chronic pain symptoms.  The patient reports that she has great difficulty standing, walking for long times, elbow pain, migraines, hip pain, shoulder and neck pain.  The patient fell at work and fractured both her elbows and also had knee replacement surgery.  The patient has been taking maintenance doses of opioid pain medications for some time.  The patient reports that she has a great deal of difficulty functioning and has very poor sleep and wakes up numerous times each night.  The patient also has dealt with depression and anxiety for many years and has been hospitalized for depression anxiety years ago.  She has been receiving counseling for anxiety and depression and takes Trintellix.  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  The patient describes chronic issues of depression anxiety but the development of severe chronic pain  After a fall at work in 2015 where she fractured both of her elbows, injured her knee and developed fibromyalgia/regional pain syndrome.  The patient reports that she has ongoing difficulties walking distances or standing.  She describes elbow pain, trouble lifting things and trouble writing for any period of time.  The patient describes significant sleep disturbance and is unable to fall asleep at night.  She reports that she is always tired.  The patient describes a normal appetite.  The patient describes memory issues related to trouble remembering things and concentration issues.  The patient reports that she is significantly less active with her children and husband and that her husband has to  help more due to her significant pain.  The above impression/diagnoses remain accurate for the current visit.  I reviewed these for this visit and the patient continues to have significant fibromyalgia and chronic pain symptoms.  Diagnosis: Fibromyalgia, anxiety and depression, neck, shoulder and back pain.

## 2018-10-19 NOTE — Progress Notes (Signed)
[id number] [place] [place] [facility] [place] [id number] [place] [id number] Phone: [phone] Fax: [phone]  [facility] [address] [place] [place] [facility] [place] [id number] [place] [id number] Phone: [phone] Fax: [facility] [address], [place] [address] Phone: [phone] Fax: [phone]    Your procedure is scheduled on Wednesday, October 7th.  Report to [facility] Main Entrance "A" at 10:30 A.M., and check in at the Admitting office.  Call this number if you have problems the morning of surgery:  [phone]  Call [phone] if you have any questions prior to your surgery date Monday-Friday 8am-4pm   Remember:  Do not eat or drink after midnight the night before your surgery   Take these medicines the morning of surgery with A SIP OF WATER  levothyroxine (SYNTHROID, LEVOTHROID) liothyronine (CYTOMEL)  Sennosides ([place])   If needed - famotidine (PEPCID), gabapentin (NEURONTIN), hydroxypropyl methylcellulose / hypromellose (ISOPTO TEARS / GONIOVISC)/eye drops, Oxycodone HCl,    7 days prior to surgery STOP taking any Aspirin (unless otherwise instructed by your surgeon), aspirin-acetaminophen-caffeine (EXCEDRIN MIGRAINE), ketorolac (TORADOL),  Aleve, Naproxen, Ibuprofen, Motrin, Advil, Goody's, BC's, all herbal medications, fish oil, and all vitamins.  The Morning of Surgery  Do not wear jewelry, make-up or nail polish.  Do not wear lotions, powders, or perfumes/colognes, or deodorant  Do not shave 48 hours prior to surgery.  Men may shave face and neck.  Do not bring valuables to the hospital.  [facility] is not responsible for any belongings or valuables.  If you are a smoker, DO NOT Smoke 24 hours prior to surgery IF you wear a CPAP at night please bring your  mask, tubing, and machine the morning of surgery   Remember that you must have someone to transport you home after your surgery, and remain with you for 24 hours if you are discharged the same day.  Contacts, glasses, hearing aids, dentures or bridgework may not be worn into surgery.   Leave your suitcase in the car.  After surgery it may be brought to your room.  For patients admitted to the hospital, discharge time will be determined by your treatment team.  Patients discharged the day of surgery will not be allowed to drive home.   Special instructions:   [facility]- Preparing For Surgery  Before surgery, you can play an important role. Because skin is not sterile, your skin needs to be as free of germs as possible. You can reduce the number of germs on your skin by washing with CHG (chlorahexidine gluconate) Soap before surgery.  CHG is an antiseptic cleaner which kills germs and bonds with the skin to continue killing germs even after washing.    Oral Hygiene is also important to reduce your risk of infection.  Remember - BRUSH YOUR TEETH THE MORNING OF SURGERY WITH YOUR REGULAR TOOTHPASTE  Please do not use if you have an allergy to CHG or antibacterial soaps. If your skin becomes reddened/irritated stop using the CHG.  Do not shave (including legs and underarms) for at least 48 hours prior to first CHG shower. It is OK to shave your face.  Please follow these instructions carefully.   1. Shower the NIGHT BEFORE SURGERY and the MORNING OF SURGERY with CHG Soap.   2. If you chose to wash your  hair, wash your hair first as usual with your normal shampoo.  3. After you shampoo, rinse your hair and body thoroughly to remove the shampoo.  4. Use CHG as you would any other liquid soap. You can apply CHG directly to the skin and wash gently with a scrungie or a clean washcloth.   5. Apply the CHG Soap to your body ONLY FROM THE NECK DOWN.  Do not use on open wounds or open sores.  Avoid contact with your eyes, ears, mouth and genitals (private parts). Wash Face and genitals (private parts)  with your normal soap.   6. Wash thoroughly, paying special attention to the area where your surgery will be performed.  7. Thoroughly rinse your body with warm water from the neck down.  8. DO NOT shower/wash with your normal soap after using and rinsing off the CHG Soap.  9. Pat yourself dry with a CLEAN TOWEL.  10. Wear CLEAN PAJAMAS to bed the night before surgery, wear comfortable clothes the morning of surgery  11. Place CLEAN SHEETS on your bed the night of your first shower and DO NOT SLEEP WITH PETS.  Day of Surgery: Do not apply any deodorants/lotions. Please shower the morning of surgery with the CHG soap  Please wear clean clothes to the hospital/surgery center.   Remember to brush your teeth WITH YOUR REGULAR TOOTHPASTE.  Please read over the following fact sheets that you were given.

## 2018-10-20 ENCOUNTER — Ambulatory Visit: Payer: Self-pay | Admitting: Orthopedic Surgery

## 2018-10-20 ENCOUNTER — Encounter (HOSPITAL_COMMUNITY)
Admission: RE | Admit: 2018-10-20 | Discharge: 2018-10-20 | Disposition: A | Discharge status: Home / Self Care | Payer: Medicare Other | Source: Ambulatory Visit | Attending: Orthopedic Surgery | Admitting: Orthopedic Surgery

## 2018-10-20 ENCOUNTER — Other Ambulatory Visit: Payer: Self-pay

## 2018-10-20 ENCOUNTER — Encounter (HOSPITAL_COMMUNITY): Payer: Self-pay

## 2018-10-20 DIAGNOSIS — E039 Hypothyroidism, unspecified: Secondary | ICD-10-CM | POA: Insufficient documentation

## 2018-10-20 DIAGNOSIS — Z79899 Other long term (current) drug therapy: Secondary | ICD-10-CM | POA: Diagnosis not present

## 2018-10-20 DIAGNOSIS — M1990 Unspecified osteoarthritis, unspecified site: Secondary | ICD-10-CM | POA: Insufficient documentation

## 2018-10-20 DIAGNOSIS — Z01812 Encounter for preprocedural laboratory examination: Secondary | ICD-10-CM | POA: Insufficient documentation

## 2018-10-20 DIAGNOSIS — Z87891 Personal history of nicotine dependence: Secondary | ICD-10-CM | POA: Insufficient documentation

## 2018-10-20 DIAGNOSIS — Z7982 Long term (current) use of aspirin: Secondary | ICD-10-CM | POA: Insufficient documentation

## 2018-10-20 DIAGNOSIS — I10 Essential (primary) hypertension: Secondary | ICD-10-CM | POA: Diagnosis not present

## 2018-10-20 DIAGNOSIS — M797 Fibromyalgia: Secondary | ICD-10-CM | POA: Insufficient documentation

## 2018-10-20 DIAGNOSIS — F419 Anxiety disorder, unspecified: Secondary | ICD-10-CM | POA: Diagnosis not present

## 2018-10-20 DIAGNOSIS — Z01818 Encounter for other preprocedural examination: Secondary | ICD-10-CM

## 2018-10-20 HISTORY — DX: Complex regional pain syndrome I, unspecified: G90.50

## 2018-10-20 HISTORY — DX: Depression, unspecified: F32.A

## 2018-10-20 LAB — CBC
HCT: 44.5 % (ref 36.0–46.0)
Hemoglobin: 15.1 g/dL — ABNORMAL HIGH (ref 12.0–15.0)
MCH: 33.3 pg (ref 26.0–34.0)
MCHC: 33.9 g/dL (ref 30.0–36.0)
MCV: 98 fL (ref 80.0–100.0)
Platelets: 194 10*3/uL (ref 150–400)
RBC: 4.54 MIL/uL (ref 3.87–5.11)
RDW: 12.7 % (ref 11.5–15.5)
WBC: 8.9 10*3/uL (ref 4.0–10.5)
nRBC: 0 % (ref 0.0–0.2)

## 2018-10-20 LAB — BASIC METABOLIC PANEL
Anion gap: 7 (ref 5–15)
BUN: 14 mg/dL (ref 6–20)
CO2: 22 mmol/L (ref 22–32)
Calcium: 8.6 mg/dL — ABNORMAL LOW (ref 8.9–10.3)
Chloride: 104 mmol/L (ref 98–111)
Creatinine, Ser: 0.88 mg/dL (ref 0.44–1.00)
GFR calc Af Amer: >60 mL/min (ref >60)
GFR calc non Af Amer: >60 mL/min (ref >60)
Glucose, Bld: 89 mg/dL (ref 70–99)
Potassium: 4.2 mmol/L (ref 3.5–5.1)
Sodium: 133 mmol/L — ABNORMAL LOW (ref 135–145)

## 2018-10-20 LAB — SURGICAL PCR SCREEN
MRSA, PCR: NEGATIVE
Staphylococcus aureus: NEGATIVE

## 2018-10-20 LAB — PROTIME-INR
INR: 1 (ref 0.8–1.2)
Prothrombin Time: 13.2 seconds (ref 11.4–15.2)

## 2018-10-20 LAB — APTT: aPTT: 30 seconds (ref 24–36)

## 2018-10-20 NOTE — H&P (Signed)
Subjective:   Sally [name] is a very pleasant 43 year old woman with PMH significant for tobacco use disorder and RSD of right leg s/p TKA (in pain management on oxycodone 10 mg Q4) who has had severe neck and progressive neuropathic right arm pain for the last 4-5 months. Despite conservative measures including OTC medications, Narcotic medications, and time the patient continues to have severe, debilitating pain altering her quality of life and therefore she would like to move forward with surgical intervention. She is scheduled for ACDF C5-7 on 10/27/18 at [facility] with Dr. Rolena Infante.  Her clearance form has been faxed to her PCP but has not been completed. Scheduled for Aspen fitting today. scheduled at [facility] for pre-[facility] work up 10/20/2018  Patient Active Problem List   Diagnosis Date Noted  . HTN (hypertension) 09/18/2016  . Physical exam 09/03/2016  . Status post total right knee replacement 08/20/2016  . Migraines 03/28/2016  . Breast pain in female 01/28/2016  . Obesity (BMI 30-39.9) 01/07/2016  . Fibromyalgia 01/07/2016  . Smoker 06/16/2015  . Anxiety and depression 04/16/2015  . Fecal incontinence 10/09/2013  . Dyspareunia 10/09/2013  . Stress incontinence 09/24/2013  . Hypothyroid 09/09/2012  . Back pain 09/09/2012  . 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 Pelvic pain in female 08/19/2010   Past Medical History:  Diagnosis Date  . Acute appendicitis 09/09/2012  . Anxiety   . Asthma   . Back pain    Occ. issues wih back pain  . Back pain 09/09/2012   It is intermittent now,  previously source of fibromyalgia dx.    . Fibromyalgia   . Headache   . Hypothyroid 09/09/2012  . Hypothyroidism   . Osteoarthritis   . Precancerous changes of the cervix   . Sleep apnea    pre lap banding-never used cpap or mask  . Vitamin D deficiency     Past Surgical History:  Procedure Laterality Date  . BILATERAL SALPINGECTOMY Bilateral 11/05/2015   Procedure: BILATERAL SALPINGECTOMY;  Surgeon: [name], MD;  Location: [facility];  Service: Gynecology;  Laterality: Bilateral;  . BRAIN SURGERY  1992   remove blood clot after a fall  . CESAREAN SECTION  1997, 2013  . cyst on ovary    . CYSTOSCOPY N/A 11/05/2015   Procedure: CYSTOSCOPY;  Surgeon: [name], MD;  Location: [place] [facility];  Service: Gynecology;  Laterality: N/A;  . GASTRIC BANDING PORT REVISION  01/20/2012   Procedure: GASTRIC BANDING PORT REVISION;  Surgeon: Pedro Earls, MD;  [facility];  Service: General;  Laterality: N/A;  . KNEE ARTHROSCOPY Left   . KNEE ARTHROSCOPY WITH DRILLING/MICROFRACTURE Right 12/06/2014   Procedure: KNEE ARTHROSCOPY WITH DRILLING/MICROFRACTURE, CHONDROPLASTY, EXCISION OF PLICA;  Surgeon: Dorna Leitz, MD;  Location: [place];  Service: Orthopedics;  Laterality: Right;  . KNEE SURGERY Right 06/26/2016   [facility]  . LAPAROSCOPIC APPENDECTOMY N/A 09/09/2012   Procedure: APPENDECTOMY LAPAROSCOPIC;  Surgeon: Merrie Roof, MD;  Location: [place];  Service: General;  Laterality: N/A;  . [place]  . LAPAROSCOPIC HYSTERECTOMY N/A 11/05/2015   Procedure: HYSTERECTOMY TOTAL LAPAROSCOPIC;  Surgeon: [name], MD;  Location: [place] [facility];  Service: Gynecology;  Laterality: N/A;  . LAPAROSCOPY  08/19/2010   Procedure: LAPAROSCOPY OPERATIVE;  Surgeon: Felipa Emory;  Location: [place] [facility];  Service: Gynecology;  Laterality: N/A;  with Biopsy of uterine serosa  . TUBAL LIGATION  08/2011    Current  Outpatient Medications  Medication Sig Dispense Refill Last Dose  . aspirin-acetaminophen-caffeine (EXCEDRIN MIGRAINE) 250-250-65 MG tablet Take 2 tablets by mouth every 6 (six) hours as needed for headache or migraine.     . conjugated estrogens (PREMARIN) vaginal cream 1/2 gram vaginally twice weekly (Patient not taking: Reported on 10/18/2018) 30 g 1 Not Taking at Unknown time  . estradiol (ESTRACE) 0.1 MG/GM vaginal cream 1 gram vaginally twice weekly  (Patient not taking: Reported on 10/18/2018) 42.5 g 1 Not Taking at Unknown time  . famotidine (PEPCID) 20 MG tablet Take 20 mg by mouth daily as needed for heartburn or indigestion.     . gabapentin (NEURONTIN) 300 MG capsule Take 300 mg by mouth 3 (three) times daily as needed (pain).    Taking  . hydroxypropyl methylcellulose / hypromellose (ISOPTO TEARS / GONIOVISC) 2.5 % ophthalmic solution Place 1 drop into both eyes 3 (three) times daily as needed for dry eyes.     Marland Kitchen ketorolac (TORADOL) 10 MG tablet Take 10 mg by mouth daily as needed for moderate pain.    Taking  . levothyroxine (SYNTHROID, LEVOTHROID) 125 MCG tablet Take 1 tablet (125 mcg total) by mouth daily. 30 tablet 3 Taking  . liothyronine (CYTOMEL) 5 MCG tablet Take 3 tablets (15 mcg total) by mouth daily. Take 2 tablets at breakfast and take one tablet at lunch (Patient taking differently: Take 5-10 mcg by mouth See admin instructions. Take 10 mcg by mouth in the morning and 5 mcg in the evening) 270 tablet 1 Taking  . NONFORMULARY OR COMPOUNDED ITEM Estradiol cream 0.02% in 42ml prefilled syringes.  20ml pv twice weekly.  #51month supply. (Patient taking differently: Place 1 Applicatorful vaginally 2 (two) times a week. Estradiol cream 0.02% in 23ml prefilled syringes.  36ml pv twice weekly.  #39month supply.) 1 each 3   . Oxycodone HCl 10 MG TABS Take 10 mg by mouth 4 (four) times daily as needed for pain.  0 Taking  . Sennosides (SENOKOT EXTRA STRENGTH) 17.2 MG TABS Take 17.2 mg by mouth daily.     . sertraline (ZOLOFT) 100 MG tablet Take 100 mg by mouth at bedtime.    Taking  . SUMAtriptan (IMITREX) 25 MG tablet Take 25 mg by mouth at bedtime as needed for migraine.    Taking  . Testosterone 10 MG/ACT (2%) GEL Apply 1 Pump topically at bedtime.    Taking  . topiramate (TOPAMAX) 50 MG tablet Take 50 mg by mouth at bedtime.    Taking  . Vitamin D, Ergocalciferol, (DRISDOL) 50000 units CAPS capsule TAKE ONE CAPSULE EVERY 7 DAYS 12 capsule 4  Taking   No current facility-administered medications for this visit.    Allergies  Allergen Reactions  . Paroxetine Hcl Rash  . Duloxetine Nausea And Vomiting  . Percocet [Oxycodone-Acetaminophen] Itching  . Codeine Other (See Comments)    Puts patient to sleep "knocks her out" for at least two days, regardless of dosage.    Social History   Tobacco Use  . Smoking status: Current Every Day Smoker    Packs/day: 1.00    Years: 20.00    Pack years: 20.00    Types: Cigarettes  . Smokeless tobacco: Never Used  Substance Use Topics  . Alcohol use: Yes    Comment: occ    Family History  Problem Relation Age of Onset  . Hypertension Maternal Grandfather   . Diabetes Paternal Grandmother   . Anesthesia problems Neg Hx  Review of Systems As stated in HPI  Objective:   Vitals: Ht: 5 ft 7 in 10/19/2018 04:02 pm Wt: 246 lbs 10/19/2018 04:06 pm BMI: 38.5 10/19/2018 04:06 pm BP: 131/85 10/19/2018 04:09 pm Pulse: 60 bpm 10/19/2018 04:09 pm T: 98.3 F 10/19/2018 04:09 pm Pain Scale: 8 10/19/2018 04:06 pm   Clinical exam: Sally [name] is a pleasant individual, who appears younger than their stated age. She Is alert and orientated 3.   Cardio: No shortness of breath, chest pain.   Lungs: CTAB  Abdomen is soft and non-tender, negative loss of bowel and bladder control, no rebound tenderness. BSX4  Negative: skin lesions abrasions contusions  Peripheral pulses: 2+ radial pulses bilaterally. Compartment soft and nontender.  Gait pattern: Slightly abnormal due to RSD in the right lower extremity secondary to a total knee replacement.  Assistive devices: No assistive devices  Neuro: 4/5 right biceps, triceps, and wrist extensor strength. 5/5 motor strength throughout in the remainder of the muscle groups tested. Positive numbness and dysesthesias in the right C6 and C7 dermatome. Positive right Spurling sign, positive Lhermitte sign. Negative Hoffman test, negative inverted  brachioradialis reflex. 2+ symmetrical deep tendon reflexes in the upper extremity bilaterally.  Musculoskeletal: No significant shoulder, elbow, wrist pain with isolated joint range of motion. Horrific neck pain with palpation and range of motion with positive occipital headaches.  X-rays of the cervical spine demonstrate slight loss in normal cervical lordosis. No significant facet arthrosis. No spinal listhesis or scoliosis  MRI of the cervical spine completed 04/08/18: No cord signal changes. Moderate to large right C6-7 disc herniation with mass-effect on the right C7 nerve root. Broad-based disc osteophyte complex C5-6 causing mass-effect on both C6 nerve roots. Small central C3-4 disc protrusion with no significant stenosis.  Assessment:   Sally [name] is a very pleasant 43 year old woman who has had severe neck and progressive neuropathic right arm pain for the last 4-5 months. She states that her quality-of-life is significantly deteriorated. She has difficulty sleeping, and the headaches are getting progressively worse. In addition she states she had a total knee replacement and this was complicated by developing an RSD. She is currently in pain management.  Clinical exam is consistent with cervical radiculopathy in the C6 and C7 dermatome. She has both motor and sensory deficits along with provocative signs of radiculopathy (nerve root tension sign).  Plan:    Move forward with a C5-7 ACDF as scheduled pending PCP clearance and labs.  At this point time we have had a long conversation about treatment options. I have gone over her MRI with her as well as surgical solution. I would recommend a 2 level ACDF. Because of her use of nicotine and the fact that it is multilevel I would also use a postoperative bone stimulator. Risks and benefits of surgery were discussed with the patient. These include: Infection, bleeding, death, stroke, paralysis, ongoing or worse pain, need for additional surgery,  nonunion, leak of spinal fluid, adjacent segment degeneration requiring additional fusion surgery. Pseudoarthrosis (nonunion)requiring supplemental posterior fixation. Throat pain, swallowing difficulties, hoarseness or change in voice.   With respect to disc replacement: Additional risks include heterotopic ossification, inability to place the disc due to technical issues requiring bailout to a fusion procedure.  We have also discussed the goals of surgery to include: Goals of surgery: Reduction in pain, and improvement in quality of life.  We have also discussed the post-operative recovery period to include: bathing/showering restrictions, wound healing, activity (and driving) restrictions, medications/pain management.  We have also discussed post-operative redflags to include: signs and symptoms of postoperative infection, DVT/PE.  Patient is being fitted for her Aspen collar today by physical therapy. We will also have her use a bone stimulator postoperatively as this is a two level fusion and she is a current smoker. We did discuss the risk of fusion failure given nicotine use and the importance of smoking cessation.  I have encouraged the patient to reach out to her primary care provider who also manages her narcotic medication to make sure 1. That the provider foxes in her medical clearance and 2. To determine if the primary care provider/pain management provider will manage the patient's pain postoperatively or if it is okay if we do so.  I did go through the patient's medication list and of encourage her to stop her Toradol as well as any other anti-inflammatories one week preoperatively as well as during the post-op period. She expressed an understanding of this.   Follow-up: 2 weeks after surgery

## 2018-10-20 NOTE — Progress Notes (Signed)
PCP - Simona Huh @ [facility]  [facility]. [facility] Cardiologist - Dr. Orlena [facility]  In [facility]  PPM/ICD - na Device Orders -  Rep Notified  Chest x-ray - today EKG - requested Stress Test - na ECHO - last month Cardiac Cath - na  Sleep Study -  Yrs. ago CPAP - no  Fasting Blood Sugar - na Checks Blood Sugar _____ times a day  Blood Thinner Instructions:na Aspirin Instructions:  ERAS Protcol -na PRE-SURGERY Ensure - na  COVID TEST- 10/5   Anesthesia review: MD consult, Pt. Has CRPS  Patient denies shortness of breath, fever, cough and chest pain at PAT appointment   Patient verbalized understanding of instructions that were given to them at the PAT appointment. Patient was also instructed that they will need to review over the PAT instructions again at home before surgery.
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
## 2018-10-25 ENCOUNTER — Other Ambulatory Visit (HOSPITAL_COMMUNITY)
Admission: RE | Admit: 2018-10-25 | Discharge: 2018-10-25 | Disposition: A | Discharge status: Home / Self Care | Payer: Medicare Other | Source: Ambulatory Visit | Attending: Orthopedic Surgery | Admitting: Orthopedic Surgery

## 2018-10-25 DIAGNOSIS — Z20828 Contact with and (suspected) exposure to other viral communicable diseases: Secondary | ICD-10-CM | POA: Insufficient documentation

## 2018-10-25 DIAGNOSIS — Z01812 Encounter for preprocedural laboratory examination: Secondary | ICD-10-CM | POA: Diagnosis present

## 2018-10-25 LAB — SARS CORONAVIRUS 2 (TAT 6-24 HRS): SARS Coronavirus 2: NEGATIVE

## 2018-10-27 ENCOUNTER — Other Ambulatory Visit: Payer: Self-pay

## 2018-10-27 ENCOUNTER — Ambulatory Visit (HOSPITAL_COMMUNITY): Payer: Medicare Other

## 2018-10-27 ENCOUNTER — Ambulatory Visit (HOSPITAL_COMMUNITY): Payer: Medicare Other | Admitting: Vascular Surgery

## 2018-10-27 ENCOUNTER — Encounter (HOSPITAL_COMMUNITY)
Admission: RE | Disposition: A | Discharge status: Home / Self Care | Payer: Self-pay | Source: Home / Self Care | Attending: Orthopedic Surgery

## 2018-10-27 ENCOUNTER — Encounter (HOSPITAL_COMMUNITY): Payer: Self-pay | Admitting: Anesthesiology

## 2018-10-27 ENCOUNTER — Ambulatory Visit (HOSPITAL_COMMUNITY): Payer: Medicare Other | Admitting: Anesthesiology

## 2018-10-27 ENCOUNTER — Observation Stay (HOSPITAL_COMMUNITY)
Admission: RE | Admit: 2018-10-27 | Discharge: 2018-10-28 | Disposition: A | Discharge status: Home / Self Care | Payer: Medicare Other | Attending: Orthopedic Surgery | Admitting: Orthopedic Surgery

## 2018-10-27 DIAGNOSIS — Z6837 Body mass index (BMI) 37.0-37.9, adult: Secondary | ICD-10-CM | POA: Diagnosis not present

## 2018-10-27 DIAGNOSIS — E669 Obesity, unspecified: Secondary | ICD-10-CM | POA: Diagnosis not present

## 2018-10-27 DIAGNOSIS — M50122 Cervical disc disorder at C5-C6 level with radiculopathy: Secondary | ICD-10-CM | POA: Diagnosis present

## 2018-10-27 DIAGNOSIS — Z96651 Presence of right artificial knee joint: Secondary | ICD-10-CM | POA: Diagnosis not present

## 2018-10-27 DIAGNOSIS — F17200 Nicotine dependence, unspecified, uncomplicated: Secondary | ICD-10-CM | POA: Diagnosis not present

## 2018-10-27 DIAGNOSIS — I10 Essential (primary) hypertension: Secondary | ICD-10-CM | POA: Diagnosis not present

## 2018-10-27 DIAGNOSIS — G4730 Sleep apnea, unspecified: Secondary | ICD-10-CM | POA: Diagnosis not present

## 2018-10-27 DIAGNOSIS — Z888 Allergy status to other drugs, medicaments and biological substances status: Secondary | ICD-10-CM | POA: Insufficient documentation

## 2018-10-27 DIAGNOSIS — M1990 Unspecified osteoarthritis, unspecified site: Secondary | ICD-10-CM | POA: Diagnosis not present

## 2018-10-27 DIAGNOSIS — E039 Hypothyroidism, unspecified: Secondary | ICD-10-CM | POA: Insufficient documentation

## 2018-10-27 DIAGNOSIS — M5020 Other cervical disc displacement, unspecified cervical region: Secondary | ICD-10-CM | POA: Diagnosis present

## 2018-10-27 DIAGNOSIS — Z419 Encounter for procedure for purposes other than remedying health state, unspecified: Secondary | ICD-10-CM

## 2018-10-27 DIAGNOSIS — Z885 Allergy status to narcotic agent status: Secondary | ICD-10-CM | POA: Insufficient documentation

## 2018-10-27 HISTORY — PX: ANTERIOR CERVICAL DECOMP/DISCECTOMY FUSION: SHX1161

## 2018-10-27 LAB — URINALYSIS, ROUTINE W REFLEX MICROSCOPIC
Bilirubin Urine: NEGATIVE
Glucose, UA: NEGATIVE mg/dL
Hgb urine dipstick: NEGATIVE
Ketones, ur: NEGATIVE mg/dL
Leukocytes,Ua: NEGATIVE
Nitrite: NEGATIVE
Protein, ur: NEGATIVE mg/dL
Specific Gravity, Urine: 1.015 (ref 1.005–1.030)
pH: 7 (ref 5.0–8.0)

## 2018-10-27 SURGERY — ANTERIOR CERVICAL DECOMPRESSION/DISCECTOMY FUSION 2 LEVELS
Anesthesia: General | Site: Neck

## 2018-10-27 MED ORDER — SUGAMMADEX SODIUM 500 MG/5ML IV SOLN
INTRAVENOUS | Status: DC | PRN
  Administered 2018-10-27: 250 mg via INTRAVENOUS

## 2018-10-27 MED ORDER — TOPIRAMATE 25 MG PO TABS
50.0000 mg | ORAL_TABLET | Freq: Every day | ORAL | Status: DC
  Administered 2018-10-27: 21:00:00 50 mg via ORAL
  Filled 2018-10-27: qty 2

## 2018-10-27 MED ORDER — SODIUM CHLORIDE 0.9% FLUSH: 3.0000 mL | INTRAVENOUS | Status: DC | PRN

## 2018-10-27 MED ORDER — OXYCODONE-ACETAMINOPHEN 10-325 MG PO TABS: 1.0000 | ORAL_TABLET | Freq: Four times a day (QID) | ORAL | 0 refills | Status: AC | PRN

## 2018-10-27 MED ORDER — OXYCODONE HCL 5 MG PO TABS
10.0000 mg | ORAL_TABLET | ORAL | Status: DC | PRN
  Administered 2018-10-27 – 2018-10-28 (×6): 10 mg via ORAL
  Filled 2018-10-27 (×6): qty 2

## 2018-10-27 MED ORDER — 0.9 % SODIUM CHLORIDE (POUR BTL) OPTIME
TOPICAL | Status: DC | PRN
  Administered 2018-10-27 (×2): 1000 mL

## 2018-10-27 MED ORDER — MENTHOL 3 MG MT LOZG: 1.0000 | LOZENGE | OROMUCOSAL | Status: DC | PRN

## 2018-10-27 MED ORDER — HEMOSTATIC AGENTS (NO CHARGE) OPTIME
TOPICAL | Status: DC | PRN
  Administered 2018-10-27: 1 via TOPICAL

## 2018-10-27 MED ORDER — ONDANSETRON HCL 4 MG/2ML IJ SOLN
INTRAMUSCULAR | Status: AC
  Filled 2018-10-27: qty 2

## 2018-10-27 MED ORDER — LEVOTHYROXINE SODIUM 25 MCG PO TABS
125.0000 ug | ORAL_TABLET | Freq: Every day | ORAL | Status: DC
  Administered 2018-10-28: 125 ug via ORAL
  Filled 2018-10-27: qty 1

## 2018-10-27 MED ORDER — FENTANYL CITRATE (PF) 100 MCG/2ML IJ SOLN
INTRAMUSCULAR | Status: DC | PRN
  Administered 2018-10-27 (×2): 50 ug via INTRAVENOUS
  Administered 2018-10-27 (×2): 100 ug via INTRAVENOUS
  Administered 2018-10-27 (×4): 50 ug via INTRAVENOUS

## 2018-10-27 MED ORDER — ONDANSETRON HCL 4 MG PO TABS: 4.0000 mg | ORAL_TABLET | Freq: Three times a day (TID) | ORAL | 0 refills | Status: DC | PRN

## 2018-10-27 MED ORDER — CEFAZOLIN SODIUM-DEXTROSE 2-4 GM/100ML-% IV SOLN
2.0000 g | INTRAVENOUS | Status: AC
  Administered 2018-10-27: 12:00:00 2 g via INTRAVENOUS

## 2018-10-27 MED ORDER — HYDROMORPHONE HCL 1 MG/ML IJ SOLN
INTRAMUSCULAR | Status: AC
  Filled 2018-10-27: qty 0.5

## 2018-10-27 MED ORDER — FENTANYL CITRATE (PF) 100 MCG/2ML IJ SOLN
INTRAMUSCULAR | Status: AC
  Filled 2018-10-27: qty 2

## 2018-10-27 MED ORDER — LIOTHYRONINE SODIUM 5 MCG PO TABS
10.0000 ug | ORAL_TABLET | Freq: Every morning | ORAL | Status: DC
  Administered 2018-10-28: 09:00:00 10 ug via ORAL
  Filled 2018-10-27: qty 2

## 2018-10-27 MED ORDER — THROMBIN 5000 UNITS EX SOLR
CUTANEOUS | Status: AC
  Filled 2018-10-27: qty 5000

## 2018-10-27 MED ORDER — THROMBIN 20000 UNITS EX SOLR
CUTANEOUS | Status: DC | PRN
  Administered 2018-10-27: 20 mL via TOPICAL

## 2018-10-27 MED ORDER — METHOCARBAMOL 500 MG PO TABS
ORAL_TABLET | ORAL | Status: AC
  Filled 2018-10-27: qty 1

## 2018-10-27 MED ORDER — FENTANYL CITRATE (PF) 250 MCG/5ML IJ SOLN
INTRAMUSCULAR | Status: AC
  Filled 2018-10-27: qty 5

## 2018-10-27 MED ORDER — ONDANSETRON HCL 4 MG/2ML IJ SOLN: 4.0000 mg | Freq: Once | INTRAMUSCULAR | Status: DC | PRN

## 2018-10-27 MED ORDER — METHOCARBAMOL 1000 MG/10ML IJ SOLN
500.0000 mg | Freq: Four times a day (QID) | INTRAVENOUS | Status: DC | PRN
  Filled 2018-10-27: qty 5

## 2018-10-27 MED ORDER — HYDROMORPHONE HCL 1 MG/ML IJ SOLN
INTRAMUSCULAR | Status: DC | PRN
  Administered 2018-10-27: .5 mg via INTRAVENOUS

## 2018-10-27 MED ORDER — ROCURONIUM BROMIDE 10 MG/ML (PF) SYRINGE
PREFILLED_SYRINGE | INTRAVENOUS | Status: DC | PRN
  Administered 2018-10-27: 50 mg via INTRAVENOUS
  Administered 2018-10-27: 30 mg via INTRAVENOUS

## 2018-10-27 MED ORDER — MIDAZOLAM HCL 5 MG/5ML IJ SOLN
INTRAMUSCULAR | Status: DC | PRN
  Administered 2018-10-27: 2 mg via INTRAVENOUS

## 2018-10-27 MED ORDER — POLYETHYLENE GLYCOL 3350 17 G PO PACK: 17.0000 g | PACK | Freq: Every day | ORAL | Status: DC | PRN

## 2018-10-27 MED ORDER — SODIUM CHLORIDE 0.9% FLUSH: 3.0000 mL | Freq: Two times a day (BID) | INTRAVENOUS | Status: DC

## 2018-10-27 MED ORDER — BUPIVACAINE-EPINEPHRINE (PF) 0.25% -1:200000 IJ SOLN
INTRAMUSCULAR | Status: AC
  Filled 2018-10-27: qty 30

## 2018-10-27 MED ORDER — HYDROMORPHONE HCL 1 MG/ML IJ SOLN
1.0000 mg | Freq: Once | INTRAMUSCULAR | Status: AC
  Administered 2018-10-27: 21:00:00 1 mg via INTRAVENOUS
  Filled 2018-10-27: qty 1

## 2018-10-27 MED ORDER — LIDOCAINE 2% (20 MG/ML) 5 ML SYRINGE
INTRAMUSCULAR | Status: AC
  Filled 2018-10-27: qty 5

## 2018-10-27 MED ORDER — THROMBIN 20000 UNITS EX SOLR
CUTANEOUS | Status: AC
  Filled 2018-10-27: qty 20000

## 2018-10-27 MED ORDER — LACTATED RINGERS IV SOLN
INTRAVENOUS | Status: DC | PRN
  Administered 2018-10-27 (×2): via INTRAVENOUS

## 2018-10-27 MED ORDER — HYDROMORPHONE HCL 1 MG/ML IJ SOLN
INTRAMUSCULAR | Status: AC
  Administered 2018-10-27: 1 mg
  Filled 2018-10-27: qty 1

## 2018-10-27 MED ORDER — SUGAMMADEX SODIUM 500 MG/5ML IV SOLN
INTRAVENOUS | Status: AC
  Filled 2018-10-27: qty 5

## 2018-10-27 MED ORDER — LIOTHYRONINE SODIUM 5 MCG PO TABS: 5.0000 ug | ORAL_TABLET | ORAL | Status: DC

## 2018-10-27 MED ORDER — ROCURONIUM BROMIDE 10 MG/ML (PF) SYRINGE
PREFILLED_SYRINGE | INTRAVENOUS | Status: AC
  Filled 2018-10-27: qty 10

## 2018-10-27 MED ORDER — OXYCODONE HCL 5 MG PO TABS: 5.0000 mg | ORAL_TABLET | ORAL | Status: DC | PRN

## 2018-10-27 MED ORDER — METHOCARBAMOL 500 MG PO TABS
500.0000 mg | ORAL_TABLET | Freq: Four times a day (QID) | ORAL | Status: DC | PRN
  Administered 2018-10-27 – 2018-10-28 (×4): 500 mg via ORAL
  Filled 2018-10-27 (×3): qty 1

## 2018-10-27 MED ORDER — LIOTHYRONINE SODIUM 5 MCG PO TABS
5.0000 ug | ORAL_TABLET | Freq: Every day | ORAL | Status: DC
  Administered 2018-10-27: 22:00:00 5 ug via ORAL
  Filled 2018-10-27: qty 1

## 2018-10-27 MED ORDER — SODIUM CHLORIDE 0.9 % IV SOLN: 250.0000 mL | INTRAVENOUS | Status: DC

## 2018-10-27 MED ORDER — METHOCARBAMOL 500 MG PO TABS: 500.0000 mg | ORAL_TABLET | Freq: Three times a day (TID) | ORAL | 0 refills | Status: AC | PRN

## 2018-10-27 MED ORDER — DEXAMETHASONE SODIUM PHOSPHATE 4 MG/ML IJ SOLN
INTRAMUSCULAR | Status: DC | PRN
  Administered 2018-10-27: 10 mg via INTRAVENOUS

## 2018-10-27 MED ORDER — HYDROMORPHONE HCL 1 MG/ML IJ SOLN
1.0000 mg | Freq: Once | INTRAMUSCULAR | Status: AC
  Administered 2018-10-27: 17:00:00 0.5 mg via INTRAVENOUS

## 2018-10-27 MED ORDER — SERTRALINE HCL 50 MG PO TABS
100.0000 mg | ORAL_TABLET | Freq: Every day | ORAL | Status: DC
  Administered 2018-10-27: 100 mg via ORAL
  Filled 2018-10-27: qty 2

## 2018-10-27 MED ORDER — LIDOCAINE 2% (20 MG/ML) 5 ML SYRINGE
INTRAMUSCULAR | Status: DC | PRN
  Administered 2018-10-27: 50 mg via INTRAVENOUS

## 2018-10-27 MED ORDER — PHENOL 1.4 % MT LIQD
1.0000 | OROMUCOSAL | Status: DC | PRN
  Administered 2018-10-28: 09:00:00 1 via OROMUCOSAL
  Filled 2018-10-27: qty 177

## 2018-10-27 MED ORDER — FENTANYL CITRATE (PF) 100 MCG/2ML IJ SOLN
25.0000 ug | INTRAMUSCULAR | Status: DC | PRN
  Administered 2018-10-27 (×2): 50 ug via INTRAVENOUS

## 2018-10-27 MED ORDER — ONDANSETRON HCL 4 MG/2ML IJ SOLN
INTRAMUSCULAR | Status: DC | PRN
  Administered 2018-10-27: 4 mg via INTRAVENOUS

## 2018-10-27 MED ORDER — CEFAZOLIN SODIUM-DEXTROSE 2-4 GM/100ML-% IV SOLN
INTRAVENOUS | Status: AC
  Filled 2018-10-27: qty 100

## 2018-10-27 MED ORDER — BUPIVACAINE-EPINEPHRINE 0.25% -1:200000 IJ SOLN
INTRAMUSCULAR | Status: DC | PRN
  Administered 2018-10-27: 6 mL

## 2018-10-27 MED ORDER — ACETAMINOPHEN 325 MG PO TABS: 650.0000 mg | ORAL_TABLET | ORAL | Status: DC | PRN

## 2018-10-27 MED ORDER — DEXAMETHASONE SODIUM PHOSPHATE 10 MG/ML IJ SOLN
INTRAMUSCULAR | Status: AC
  Filled 2018-10-27: qty 1

## 2018-10-27 MED ORDER — DOCUSATE SODIUM 100 MG PO CAPS
100.0000 mg | ORAL_CAPSULE | Freq: Two times a day (BID) | ORAL | Status: DC
  Administered 2018-10-27 – 2018-10-28 (×2): 100 mg via ORAL
  Filled 2018-10-27 (×2): qty 1

## 2018-10-27 MED ORDER — ONDANSETRON HCL 4 MG/2ML IJ SOLN: 4.0000 mg | Freq: Four times a day (QID) | INTRAMUSCULAR | Status: DC | PRN

## 2018-10-27 MED ORDER — CEFAZOLIN SODIUM-DEXTROSE 1-4 GM/50ML-% IV SOLN
1.0000 g | Freq: Three times a day (TID) | INTRAVENOUS | Status: AC
  Administered 2018-10-27 – 2018-10-28 (×2): 1 g via INTRAVENOUS
  Filled 2018-10-27 (×2): qty 50

## 2018-10-27 MED ORDER — GABAPENTIN 300 MG PO CAPS
300.0000 mg | ORAL_CAPSULE | Freq: Three times a day (TID) | ORAL | Status: DC
  Administered 2018-10-27 – 2018-10-28 (×3): 300 mg via ORAL
  Filled 2018-10-27 (×2): qty 1

## 2018-10-27 MED ORDER — ACETAMINOPHEN 650 MG RE SUPP: 650.0000 mg | RECTAL | Status: DC | PRN

## 2018-10-27 MED ORDER — MIDAZOLAM HCL 2 MG/2ML IJ SOLN
INTRAMUSCULAR | Status: AC
  Filled 2018-10-27: qty 2

## 2018-10-27 MED ORDER — LACTATED RINGERS IV SOLN: INTRAVENOUS | Status: DC

## 2018-10-27 MED ORDER — ONDANSETRON HCL 4 MG PO TABS: 4.0000 mg | ORAL_TABLET | Freq: Four times a day (QID) | ORAL | Status: DC | PRN

## 2018-10-27 MED ORDER — PROPOFOL 10 MG/ML IV BOLUS
INTRAVENOUS | Status: DC | PRN
  Administered 2018-10-27: 200 mg via INTRAVENOUS

## 2018-10-27 MED ORDER — GABAPENTIN 300 MG PO CAPS: 300.0000 mg | ORAL_CAPSULE | Freq: Three times a day (TID) | ORAL | Status: DC | PRN

## 2018-10-27 SURGICAL SUPPLY — 76 items
AGENT HMST KT MTR STRL THRMB (HEMOSTASIS) ×1
BIT DRILL NEURO 2X3.1 SFT TUCH (MISCELLANEOUS) IMPLANT
BLADE CLIPPER SURG (BLADE) IMPLANT
BONE VIVIGEN FORMABLE 1.3CC (Bone Implant) ×2 IMPLANT
BUR EGG ELITE 4.0 (BURR) IMPLANT
BUR MATCHSTICK NEURO 3.0 LAGG (BURR) IMPLANT
CABLE BIPOLOR RESECTION CORD (MISCELLANEOUS) ×1 IMPLANT
CANISTER SUCT 3000ML PPV (MISCELLANEOUS) ×2 IMPLANT
CLSR STERI-STRIP ANTIMIC 1/2X4 (GAUZE/BANDAGES/DRESSINGS) ×2 IMPLANT
COVER MAYO STAND STRL (DRAPES) ×6 IMPLANT
COVER SURGICAL LIGHT HANDLE (MISCELLANEOUS) ×2 IMPLANT
COVER WAND RF STERILE (DRAPES) ×1 IMPLANT
DEVICE ENDSKLTN IMPL 16X14X7X6 (Cage) IMPLANT
DRAPE C-ARM 42X72 X-RAY (DRAPES) ×2 IMPLANT
DRAPE MICROSCOPE LEICA 46X105 (MISCELLANEOUS) IMPLANT
DRAPE POUCH INSTRU U-SHP 10X18 (DRAPES) ×2 IMPLANT
DRAPE SURG 17X23 STRL (DRAPES) ×2 IMPLANT
DRAPE U-SHAPE 47X51 STRL (DRAPES) ×2 IMPLANT
DRILL NEURO 2X3.1 SOFT TOUCH (MISCELLANEOUS)
DRSG OPSITE POSTOP 3X4 (GAUZE/BANDAGES/DRESSINGS) ×2 IMPLANT
DURAPREP 26ML APPLICATOR (WOUND CARE) ×2 IMPLANT
ELECT COATED BLADE 2.86 ST (ELECTRODE) ×2 IMPLANT
ELECT PENCIL ROCKER SW 15FT (MISCELLANEOUS) ×2 IMPLANT
ELECT REM PT RETURN 9FT ADLT (ELECTROSURGICAL) ×2
ELECTRODE REM PT RTRN 9FT ADLT (ELECTROSURGICAL) ×1 IMPLANT
ENDOSKELETON IMPLANT 16X14X7X6 (Cage) ×4 IMPLANT
GLOVE BIO SURGEON STRL SZ 6.5 (GLOVE) ×2 IMPLANT
GLOVE BIOGEL PI IND STRL 6.5 (GLOVE) ×1 IMPLANT
GLOVE BIOGEL PI IND STRL 8 (GLOVE) IMPLANT
GLOVE BIOGEL PI IND STRL 8.5 (GLOVE) ×1 IMPLANT
GLOVE BIOGEL PI INDICATOR 6.5 (GLOVE) ×1
GLOVE BIOGEL PI INDICATOR 8 (GLOVE) ×4
GLOVE BIOGEL PI INDICATOR 8.5 (GLOVE) ×1
GLOVE ECLIPSE 7.5 STRL STRAW (GLOVE) ×3 IMPLANT
GLOVE SS BIOGEL STRL SZ 8.5 (GLOVE) ×1 IMPLANT
GLOVE SUPERSENSE BIOGEL SZ 8.5 (GLOVE) ×1
GOWN STRL REUS W/ TWL LRG LVL3 (GOWN DISPOSABLE) ×1 IMPLANT
GOWN STRL REUS W/TWL 2XL LVL3 (GOWN DISPOSABLE) ×4 IMPLANT
GOWN STRL REUS W/TWL LRG LVL3 (GOWN DISPOSABLE) ×2
GRAFT BNE MATRIX VG FRMBL SM 1 (Bone Implant) IMPLANT
KIT BASIN OR (CUSTOM PROCEDURE TRAY) ×2 IMPLANT
KIT TURNOVER KIT B (KITS) ×2 IMPLANT
NDL SPNL 18GX3.5 QUINCKE PK (NEEDLE) ×1 IMPLANT
NEEDLE HYPO 22GX1.5 SAFETY (NEEDLE) ×2 IMPLANT
NEEDLE SPNL 18GX3.5 QUINCKE PK (NEEDLE) ×2 IMPLANT
NS IRRIG 1000ML POUR BTL (IV SOLUTION) ×3 IMPLANT
PACK ORTHO CERVICAL (CUSTOM PROCEDURE TRAY) ×2 IMPLANT
PACK UNIVERSAL I (CUSTOM PROCEDURE TRAY) ×2 IMPLANT
PAD ARMBOARD 7.5X6 YLW CONV (MISCELLANEOUS) ×4 IMPLANT
PATTIES SURGICAL .25X.25 (GAUZE/BANDAGES/DRESSINGS) ×2 IMPLANT
PIN DISTRACTION 14 (PIN) ×2 IMPLANT
PLATE TWO LEVEL SKYLINE 30MM (Plate) ×1 IMPLANT
POSITIONER HEAD DONUT 9IN (MISCELLANEOUS) ×2 IMPLANT
PUTTY DBX 1CC (Putty) ×2 IMPLANT
PUTTY DBX 1CC DEPUY (Putty) IMPLANT
RESTRAINT LIMB HOLDER UNIV (RESTRAINTS) ×2 IMPLANT
RUBBERBAND STERILE (MISCELLANEOUS) IMPLANT
SCREW SKYLINE 14MM SD-VA (Screw) ×6 IMPLANT
SPONGE INTESTINAL PEANUT (DISPOSABLE) ×3 IMPLANT
SPONGE LAP 4X18 RFD (DISPOSABLE) ×3 IMPLANT
SPONGE SURGIFOAM ABS GEL 100 (HEMOSTASIS) ×2 IMPLANT
SURGIFLO W/THROMBIN 8M KIT (HEMOSTASIS) ×1 IMPLANT
SUT BONE WAX W31G (SUTURE) ×2 IMPLANT
SUT MON AB 3-0 SH 27 (SUTURE) ×2
SUT MON AB 3-0 SH27 (SUTURE) ×1 IMPLANT
SUT SILK 2 0 (SUTURE)
SUT SILK 2-0 18XBRD TIE 12 (SUTURE) IMPLANT
SUT VIC AB 2-0 CT1 18 (SUTURE) ×2 IMPLANT
SYR BULB IRRIGATION 50ML (SYRINGE) ×2 IMPLANT
SYR CONTROL 10ML LL (SYRINGE) ×2 IMPLANT
TAPE CLOTH 4X10 WHT NS (GAUZE/BANDAGES/DRESSINGS) ×2 IMPLANT
TAPE UMBILICAL COTTON 1/8X30 (MISCELLANEOUS) ×2 IMPLANT
TOWEL GREEN STERILE (TOWEL DISPOSABLE) ×2 IMPLANT
TOWEL GREEN STERILE FF (TOWEL DISPOSABLE) ×2 IMPLANT
WATER STERILE IRR 1000ML POUR (IV SOLUTION) ×2 IMPLANT
YANKAUER SUCT BULB TIP NO VENT (SUCTIONS) ×1 IMPLANT

## 2018-10-27 NOTE — Progress Notes (Signed)
Orthopedic Tech Progress Note Patient Details:  Nohemy Kleinow December 04, 1975 [id number] Patient supplied before surgery  Patient ID: Lucendia Waligorski, female   DOB: May 18, 1975, 43 y.o.   MRN: [id number]   Janit Pagan 10/27/2018, 5:18 PM

## 2018-10-27 NOTE — Anesthesia Preprocedure Evaluation (Addendum)
Anesthesia Evaluation  Patient identified by MRN, date of birth, ID band Patient awake    Reviewed: Allergy & Precautions, NPO status , Patient's Chart, lab work & pertinent test results  Airway Mallampati: II  TM Distance: >3 FB     Dental  (+) Dental Advisory Given   Pulmonary sleep apnea , Current Smoker and Patient abstained from smoking.,    breath sounds clear to auscultation       Cardiovascular hypertension,  Rhythm:Regular Rate:Normal     Neuro/Psych  Headaches,  Neuromuscular disease    GI/Hepatic negative GI ROS, Neg liver ROS,   Endo/Other  Hypothyroidism   Renal/GU negative Renal ROS     Musculoskeletal  (+) Arthritis ,   Abdominal   Peds  Hematology negative hematology ROS (+)   Anesthesia Other Findings   Reproductive/Obstetrics                            Lab Results  Component Value Date   WBC 8.9 10/20/2018   HGB 15.1 (H) 10/20/2018   HCT 44.5 10/20/2018   MCV 98.0 10/20/2018   PLT 194 10/20/2018   Lab Results  Component Value Date   CREATININE 0.88 10/20/2018   BUN 14 10/20/2018   NA 133 (L) 10/20/2018   K 4.2 10/20/2018   CL 104 10/20/2018   CO2 22 10/20/2018    Anesthesia Physical Anesthesia Plan  ASA: II  Anesthesia Plan: General   Post-op Pain Management:    Induction: Intravenous  PONV Risk Score and Plan: 2 and Dexamethasone, Ondansetron and Treatment may vary due to age or medical condition  Airway Management Planned: Oral ETT  Additional Equipment:   Intra-op Plan:   Post-operative Plan: Extubation in OR  Informed Consent: I have reviewed the patients History and Physical, chart, labs and discussed the procedure including the risks, benefits and alternatives for the proposed anesthesia with the patient or authorized representative who has indicated his/her understanding and acceptance.     Dental advisory given  Plan Discussed with:  CRNA  Anesthesia Plan Comments:        Anesthesia Quick Evaluation

## 2018-10-27 NOTE — Anesthesia Postprocedure Evaluation (Signed)
Anesthesia Post Note  Patient: Sally [name]  Procedure(s) Performed: ANTERIOR CERVICAL DECOMPRESSION/DISCECTOMY CERVICAL FIVE-SIX, CERVICAL SIX-SEVEN (N/A Neck)     Patient location during evaluation: PACU Anesthesia Type: General Level of consciousness: awake and alert Pain management: pain level controlled Vital Signs Assessment: post-procedure vital signs reviewed and stable Respiratory status: spontaneous breathing, nonlabored ventilation, respiratory function stable and patient connected to nasal cannula oxygen Cardiovascular status: blood pressure returned to baseline and stable Postop Assessment: no apparent nausea or vomiting Anesthetic complications: no    Last Vitals:  Vitals:   10/27/18 1636 10/27/18 1709  BP:  (!) 157/87  Pulse: 68 66  Resp: 15 14  Temp:  37 C  SpO2: 93% 95%    Last Pain:  Vitals:   10/27/18 1709  TempSrc: Oral  PainSc:                  [name]

## 2018-10-27 NOTE — Brief Op Note (Signed)
10/27/2018  3:30 PM  PATIENT:  Sally [name]  43 y.o. female  PRE-OPERATIVE DIAGNOSIS:  Herniated disc, cervical radiculopathy right  POST-OPERATIVE DIAGNOSIS:  Herniated disc, cervical radiculopathy right  PROCEDURE:  Procedure(s): ANTERIOR CERVICAL DECOMPRESSION/DISCECTOMY CERVICAL FIVE-SIX, CERVICAL SIX-SEVEN (N/A)  SURGEON:  Surgeon(s) and Role:    Melina Schools, MD - Primary  PHYSICIAN ASSISTANT:   ASSISTANTS: Amanda Ward, PA   ANESTHESIA:   general  EBL:  minimal   BLOOD ADMINISTERED:none  DRAINS: none   LOCAL MEDICATIONS USED:  MARCAINE     SPECIMEN:  No Specimen  DISPOSITION OF SPECIMEN:  N/A  COUNTS:  YES  TOURNIQUET:  * No tourniquets in log *  DICTATION: .Dragon Dictation  PLAN OF CARE: Admit for overnight observation  PATIENT DISPOSITION:  PACU - hemodynamically stable.

## 2018-10-27 NOTE — Discharge Instructions (Signed)

## 2018-10-27 NOTE — Transfer of Care (Signed)
Immediate Anesthesia Transfer of Care Note  Patient: Sally [name]  Procedure(s) Performed: ANTERIOR CERVICAL DECOMPRESSION/DISCECTOMY CERVICAL FIVE-SIX, CERVICAL SIX-SEVEN (N/A Neck)  Patient Location: PACU  Anesthesia Type:General  Level of Consciousness: awake, alert , oriented and patient cooperative  Airway & Oxygen Therapy: Patient Spontanous Breathing and Patient connected to nasal cannula oxygen  Post-op Assessment: Report given to RN and Post -op Vital signs reviewed and stable  Post vital signs: Reviewed and stable  Last Vitals:  Vitals Value Taken Time  BP 153/69 10/27/18 1533  Temp 37.2 C 10/27/18 1533  Pulse 98 10/27/18 1535  Resp 8 10/27/18 1535  SpO2 95 % 10/27/18 1535  Vitals shown include unvalidated device data.  Last Pain:  Vitals:   10/27/18 1054  TempSrc: Oral  PainSc:          Complications: No apparent anesthesia complications

## 2018-10-27 NOTE — Anesthesia Procedure Notes (Signed)
Procedure Name: Intubation Date/Time: 10/27/2018 12:33 PM Performed by: Eulas Post, Anikka Marsan W, CRNA Pre-anesthesia Checklist: Patient identified, Emergency Drugs available, Suction available and Patient being monitored Patient Re-evaluated:Patient Re-evaluated prior to induction Oxygen Delivery Method: Circle system utilized Preoxygenation: Pre-oxygenation with 100% oxygen Induction Type: IV induction Ventilation: Mask ventilation without difficulty Grade View: Grade I Tube type: Oral Tube size: 7.0 mm Number of attempts: 1 Airway Equipment and Method: Stylet Placement Confirmation: ETT inserted through vocal cords under direct vision,  positive ETCO2 and breath sounds checked- equal and bilateral Secured at: 22 cm Tube secured with: Tape Dental Injury: Teeth and Oropharynx as per pre-operative assessment

## 2018-10-27 NOTE — Op Note (Signed)
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  Right radicular arm pain C6 and C7 dermatome  Postoperative diagnosis: Same  Operative procedure: Anterior cervical discectomy and fusion C5-7  First Assistant: Cleta Alberts, PA  Implants: Medtronic Nano lock intervertebral spacer.  7 mm medium lordotic utilized at both levels.  Depuy anterior cervical skyline plate.  30 mm length plate with 14 mm locking screws x6.  Allograft: :vivogen and DBX  Complications: None  Indications: This is a very pleasant 43 year old man who presented to my office with complaints of significant neck and radicular arm pain on the right side.  Imaging studies and clinical exam consistent with C6 and C7 nerve root compression causing radicular arm pain.  As result of the failure of conservative care we elected to move forward with surgery.  All appropriate risks benefits and alternatives to surgery were discussed with the patient and consent was obtained.  Operative note: Patient was brought to the operating room placed by the operating room table.  After successful induction of general anesthesia and endotracheal patient teds SCDs were applied and the anterior cervical spine was prepped and draped in a standard fashion.  Timeout was taken to confirm patient procedure and all other important data.  Lateral fluoroscopy was then utilized to identify the C6 vertebral body.  I then marked out the incision site on the surface of the skin and infiltrated with quarter percent Marcaine with epinephrine.  Transverse incision was made over the C6 vertebral body and sharp dissection was carried out down to the platysma.  Platysma muscle was divided and I began sharply dissecting into the deep cervical fascia.  This was a standard Smith-Robinson approach to the anterior cervical spine.  Identified and isolated the omohyoid and sacrificed it for better visualization.  I  continued to sharply dissect through the remainder of the deep cervical and prevertebral fascia until I can visualize the anterior longitudinal ligament.  I then swept the trachea and esophagus over to the right with my finger and protected with an appendiceal retractor.  Using [employer] I continue to mobilize the remaining prevertebral fascia to expose from the superior aspect of C5 to the inferior aspect of C7.  Once I had this exposed I placed the needle into the C5-6 disc space and took an intraoperative lateral fluoroscopy.  I confirmed that I was at the appropriate level and marked the disc space.  Bipolar cautery was used to mobilize the longus coli muscles from the superior aspect of C5 to the inferior aspect of C7.  I then placed the Caspar retracting blades underneath the longus coli muscle and connected to the retracting system and expanded the retractors to the appropriate width.  I did deflate and reinflate the endotracheal cuff during this process.  With the C6-7 disc space properly visualized I proceeded with my ACDF.  An annulotomy was performed with a 15 blade scalpel and I used pituitary rongeurs and curettes to mobilize and remove the disc material 2 mm Kerrison Francee Piccolo was used to remove the overhanging osteophyte from the inferior aspect of C6.  I continued using my curettes to remove the disc material and the cartilaginous endplate.  I then used my fine nerve hook to mobilize the fragment of disc material in the right posterior lateral corner.  3 large fragments of disc material mobilized and removed.  It was consistent with what was seen on the preoperative MRI.  This allowed me to past  1 mm Kerrison underneath the vertebral body of C6 and C7 and resect the posterior osteophyte.  I then undercut the uncovertebral joints to adequately decompress the nerve.  At this point I gently continue to dissect using a nerve hook until I developed my plane between the posterior longitudinal  ligament and the thecal sac.  I then used a 1 mm Kerrison Roger to resect the PLL and confirm I had an adequate discectomy.  I was able to easily sweep behind the vertebral body of C6 and C7 with my nerve hook confirming had an adequate decompression and I had removed all of the disc fragments.  At this point I rasped the endplates and then measured and elected to use the size 7 medium lordotic cage.  The cage was obtained and packed with allograft and then malleted to the appropriate depth.  I confirmed satisfactory positioning with the lateral fluoroscopy view.  With the C6-7 ACDF complete I repositioned my pin into the C5 vertebral body and expose the C5-6 disc space.  Using the same technique an annulotomy was performed and I resected the disc material.  I again used fine nerve curettes and proximal to remove the posterior annulus and hard disc osteophytes.  Using a 1 mm Kerrison Roger I resected the osteophyte from the posterior aspect of the C5 and C6 vertebral body alignment of better visualize the posterior annulus.  I dissected through the posterior annulus with my nerve hook and removed fragments of disc osteophyte complex.  I then used a 1 mm Kerrison Roger to resect the posterior longitudinal ligament to complete my discectomy/decompression.  I was also able to undercut the uncovertebral joint to adequately decompress this area as well.  Once this was completed I rasped the endplates side bleeding subchondral bone and then placed the 7 mm cage.  At this point imaging confirmed satisfactory position of both cages.  I then obtained the anterior cervical plate contoured it to the cervical spine and secured it with self drilling screws into the body of C5-C6 and C7.  All 6 screws had excellent purchase.  Prior to placing the plate I did remove the distraction pins and sealed the bone hole with bone wax for hemostasis.  Once the plate was secured to the anterior cervical spine I checked to ensure that the  esophagus did not become entrapped beneath it.  All screws were final tightened and then locked in place according manufacture standards.  FloSeal and bipolar cautery was used to obtain hemostasis.  Final irrigation was done and I again made sure the esophagus was not contracting the plate.  I then returned the esophagus and trachea to midline and then closed the platysma with interrupted 2-0 Vicryl suture.  Final AP and lateral x-rays were taken which demonstrated satisfactory position in both planes of the hardware and the intervertebral cage.  The skin was reapproximated with a 3-0 Monocryl Steri-Strips dry dressing and an Aspen collar were applied and the patient was ultimately extubated and transferred the PACU without incident.  The end of the case all needle sponge counts were correct and there were no adverse intraoperative events.

## 2018-10-27 NOTE — H&P (Signed)
Addendum H&P  Patient continues to have significant neck and radicular right arm pain.  There is been no change in her clinical exam since her last office visit on 10/20/2018.  Surgical plan is to move forward with a two-level anterior cervical discectomy and fusion C5-7.  I have gone over the surgical procedure as well as the risks and benefits and alternatives with the patient.  She is expressed an understanding and has indicated she still would like to proceed with the surgery.

## 2018-10-28 DIAGNOSIS — M50122 Cervical disc disorder at C5-C6 level with radiculopathy: Secondary | ICD-10-CM | POA: Diagnosis not present

## 2018-10-28 NOTE — Evaluation (Signed)
Physical Therapy Evaluation and Discharge Patient Details Name: Sally [name] MRN: [id number] DOB: Jan 03, 1976 Today's Date: 10/28/2018   History of Present Illness  Pt is a 43 year old woman admitted 10/27/18 for C5-6 ACDF. 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  Clinical Impression  Patient evaluated by Physical Therapy with no further acute PT needs identified. All education has been completed and the patient has no further questions. Pt was able to demonstrate transfers and ambulation with gross modified independence without an AD (occasional railing use required). Pt was educated on precautions, brace application/wearing schedule, appropriate activity progression, and car transfer. See below for any follow-up Physical Therapy or equipment needs. PT is signing off. Thank you for this referral.     Follow Up Recommendations No PT follow up;Supervision - Intermittent    Equipment Recommendations  None recommended by PT    Recommendations for Other Services       Precautions / Restrictions Precautions Precautions: Cervical Precaution Booklet Issued: Yes (comment) Precaution Comments: reviewed back precautions Required Braces or Orthoses: Cervical Brace Cervical Brace: Hard collar Restrictions Weight Bearing Restrictions: No      Mobility  Bed Mobility               General bed mobility comments: verbally reviewed log roll technique.   Transfers Overall transfer level: Modified independent Equipment used: None             General transfer comment: No unsteadiness or LOB noted.   Ambulation/Gait Ambulation/Gait assistance: Modified independent (Device/Increase time) Gait Distance (Feet): 400 Feet Assistive device: None Gait Pattern/deviations: Step-through pattern;Decreased stride length Gait velocity: Decreased Gait velocity interpretation: <1.8 ft/sec, indicate of risk for  recurrent falls General Gait Details: Slow and somewhat guarded but overall appears comfortable with occasional reach out to railing in hallway.   Stairs Stairs: Yes Stairs assistance: Min guard Stair Management: One rail Right;Forwards;Alternating pattern Number of Stairs: 10 General stair comments: Pt demonstrated well without difficulty. VC's to count stairs as she should not look down for the next step  Wheelchair Mobility    Modified Rankin (Stroke Patients Only)       Balance Overall balance assessment: Needs assistance Sitting-balance support: Feet supported;No upper extremity supported Sitting balance-Leahy Scale: Fair     Standing balance support: Single extremity supported Standing balance-Leahy Scale: Fair                               Pertinent Vitals/Pain Pain Assessment: Faces Faces Pain Scale: Hurts even more Pain Location: neck Pain Descriptors / Indicators: Operative site guarding Pain Intervention(s): Monitored during session    Home Living Family/patient expects to be discharged to:: Private residence Living Arrangements: Spouse/significant other;Children Available Help at Discharge: Family;Available PRN/intermittently(58 year old son all the time) Type of Home: House Home Access: Stairs to enter Entrance Stairs-Rails: Right Entrance Stairs-Number of Steps: 5 Home Layout: One level Home Equipment: Cane - single point;Walker - 2 wheels;Shower seat;Bedside commode      Prior Function Level of Independence: Independent with assistive device(s)         Comments: used cane intermittently     Hand Dominance   Dominant Hand: Right    Extremity/Trunk Assessment   Upper Extremity Assessment Upper Extremity Assessment: Defer to OT evaluation    Lower Extremity Assessment Lower Extremity Assessment: Overall WFL for tasks assessed    Cervical / Trunk  Assessment Cervical / Trunk Assessment: Other exceptions Cervical / Trunk  Exceptions: s/p cervical sx  Communication   Communication: No difficulties  Cognition Arousal/Alertness: Awake/alert Behavior During Therapy: WFL for tasks assessed/performed Overall Cognitive Status: Within Functional Limits for tasks assessed                                        General Comments      Exercises     Assessment/Plan    PT Assessment Patent does not need any further PT services  PT Problem List         PT Treatment Interventions      PT Goals (Current goals can be found in the Care Plan section)  Acute Rehab PT Goals Patient Stated Goal: to go home and sleep PT Goal Formulation: All assessment and education complete, DC therapy    Frequency     Barriers to discharge        Co-evaluation               AM-PAC PT "6 Clicks" Mobility  Outcome Measure Help needed turning from your back to your side while in a flat bed without using bedrails?: None Help needed moving from lying on your back to sitting on the side of a flat bed without using bedrails?: None Help needed moving to and from a bed to a chair (including a wheelchair)?: None Help needed standing up from a chair using your arms (e.g., wheelchair or bedside chair)?: None Help needed to walk in hospital room?: None Help needed climbing 3-5 steps with a railing? : None 6 Click Score: 24    End of Session Equipment Utilized During Treatment: Gait belt;Cervical collar Activity Tolerance: Patient tolerated treatment well Patient left: with call bell/phone within reach(Sitting EOB) Nurse Communication: Mobility status PT Visit Diagnosis: Unsteadiness on feet (R26.81);Pain Pain - part of body: (neck)    Time: [id number] PT Time Calculation (min) (ACUTE ONLY): 14 min   Charges:   PT Evaluation $PT Eval Low Complexity: 1 Low          Rolinda Roan, PT, DPT Acute Rehabilitation Services Pager: [phone] Office: [phone]   Thelma Comp 10/28/2018, 2:04  PM

## 2018-10-28 NOTE — Plan of Care (Signed)
Patient alert and oriented, mae's well, voiding adequate amount of urine, swallowing without difficulty, no c/o pain at time of discharge. Patient discharged home with family. Script and discharged instructions given to patient. Patient and family stated understanding of instructions given. Patient has an appointment with Dr. Brooks  

## 2018-10-28 NOTE — Evaluation (Signed)
Occupational Therapy Evaluation Patient Details Name: Sally [name] MRN: [id number] DOB: [birth year] Today's Date: 10/28/2018    History of Present Illness Pt is a 43 year old woman admitted 10/27/18 for C5-6 ACDF. 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   Clinical Impression   Pt educated in cervical precautions, correct position of cervical collar, compensatory strategies for ADL and IADL to avoid and defer to her husband. Pt verbalizing understanding. No further OT needs.    Follow Up Recommendations  No OT follow up    Equipment Recommendations  None recommended by OT    Recommendations for Other Services       Precautions / Restrictions Precautions Precautions: Cervical Precaution Booklet Issued: Yes (comment) Precaution Comments: reviewed back precautions Required Braces or Orthoses: Cervical Brace Cervical Brace: Hard collar Restrictions Weight Bearing Restrictions: No      Mobility Bed Mobility Overal bed mobility: Modified Independent             General bed mobility comments: log roll technique  Transfers Overall transfer level: Modified independent Equipment used: None                  Balance                                           ADL either performed or assessed with clinical judgement   ADL Overall ADL's : Modified independent                                       General ADL Comments: educated in compensatory strategies for ADL and IADL, activities to avoid     Vision         Perception     Praxis      Pertinent Vitals/Pain Pain Assessment: Faces Faces Pain Scale: Hurts even more Pain Location: neck Pain Descriptors / Indicators: Sore Pain Intervention(s): Monitored during session;Repositioned     Hand Dominance Right   Extremity/Trunk Assessment Upper Extremity Assessment Upper Extremity Assessment:  Overall WFL for tasks assessed   Lower Extremity Assessment Lower Extremity Assessment: Defer to PT evaluation   Cervical / Trunk Assessment Cervical / Trunk Assessment: Other exceptions Cervical / Trunk Exceptions: s/p cervical sx   Communication Communication Communication: No difficulties   Cognition Arousal/Alertness: Awake/alert Behavior During Therapy: WFL for tasks assessed/performed Overall Cognitive Status: Within Functional Limits for tasks assessed                                     General Comments       Exercises     Shoulder Instructions      Home Living Family/patient expects to be discharged to:: Private residence Living Arrangements: Spouse/significant other;Children Available Help at Discharge: Family;Available PRN/intermittently(66 year old son all the time) Type of Home: House Home Access: Stairs to enter [employer] of Steps: 5 Entrance Stairs-Rails: Right Home Layout: One level     Bathroom Shower/Tub: Occupational psychologist: Standard     Home Equipment: Cane - single point;Walker - 2 wheels;Shower seat;Bedside commode          Prior Functioning/Environment Level of Independence: Independent with assistive  device(s)        Comments: used cane intermittently        OT Problem List:        OT Treatment/Interventions:      OT Goals(Current goals can be found in the care plan section) Acute Rehab OT Goals Patient Stated Goal: to go home and sleep  OT Frequency:     Barriers to D/C:            Co-evaluation              AM-PAC OT "6 Clicks" Daily Activity     Outcome Measure Help from another person eating meals?: None Help from another person taking care of personal grooming?: None Help from another person toileting, which includes using toliet, bedpan, or urinal?: None Help from another person bathing (including washing, rinsing, drying)?: None Help from another person to put on  and taking off regular upper body clothing?: None Help from another person to put on and taking off regular lower body clothing?: None 6 Click Score: 24   End of Session Equipment Utilized During Treatment: Cervical collar  Activity Tolerance: Patient tolerated treatment well Patient left:    OT Visit Diagnosis: Pain                Time: [id number] OT Time Calculation (min): 17 min Charges:  OT General Charges $OT Visit: 1 Visit OT Evaluation $OT Eval Low Complexity: 1 Low  Nestor Lewandowsky, OTR/L Acute Rehabilitation Services Pager: [phone] Office: [phone]  Malka So 10/28/2018, 8:58 AM

## 2018-10-28 NOTE — Progress Notes (Signed)
    Subjective: Procedure(s) (LRB): ANTERIOR CERVICAL DECOMPRESSION/DISCECTOMY CERVICAL FIVE-SIX, CERVICAL SIX-SEVEN (N/A) 1 Day Post-Op  Patient reports pain as 2 on 0-10 scale.  Reports decreased arm pain reports incisional neck pain   Positive void Negative bowel movement Positive flatus Negative chest pain or shortness of breath  Objective: Vital signs in last 24 hours: Temp:  [97.6 F (36.4 C)-98.9 F (37.2 C)] 98.6 F (37 C) (10/08 0712) Pulse Rate:  [57-95] 59 (10/08 0712) Resp:  [6-20] 16 (10/08 0712) BP: (117-157)/(69-96) 140/72 (10/08 0712) SpO2:  [90 %-99 %] 99 % (10/08 0712) Weight:  [111.1 kg] 111.1 kg (10/07 1054)  Intake/Output from previous day: 10/07 0701 - 10/08 0700 In: 1940 [P.O.:840; I.V.:1000; IV Piggyback:100] Out: 50 [Blood:50]  Labs: No results for input(s): WBC, RBC, HCT, PLT in the last 72 hours. No results for input(s): NA, K, CL, CO2, BUN, CREATININE, GLUCOSE, CALCIUM in the last 72 hours. No results for input(s): LABPT, INR in the last 72 hours.  Physical Exam: Neurologically intact ABD soft Intact pulses distally Incision: dressing C/D/I and no drainage Compartment soft Body mass index is 37.25 kg/m.  Assessment/Plan: Patient stable  xrays n/a Mobilization with physical therapy Encourage incentive spirometry Continue care  Advance diet Up with therapy  1.  Patient is doing well overall.  She continues to have axillary neck pain which is consistent with typical postoperative pain.  Her radicular arm pain is improved.  She no longer has the severe burning dysesthesias in the right upper extremity. 2.  There is no significant swelling at the incision site or difficulty breathing. 3.  Plan on discharge to home today.  She will follow-up with me in 2 weeks for reevaluation.  Melina Schools, MD [facility] [phone]

## 2018-10-29 ENCOUNTER — Encounter (HOSPITAL_COMMUNITY): Payer: Self-pay | Admitting: Orthopedic Surgery

## 2018-11-04 NOTE — Discharge Summary (Signed)
Patient ID: Khady Duwe MRN: [id number] DOB/AGE: February 24, 1975 43 y.o.  Admit date: 10/27/2018 Discharge date: 11/04/2018  Admission Diagnoses:  Active Problems:   Cervical disc herniation   Discharge Diagnoses:  Active Problems:   Cervical disc herniation  status post Procedure(s): ANTERIOR CERVICAL DECOMPRESSION/DISCECTOMY CERVICAL FIVE-SIX, CERVICAL SIX-SEVEN  Past Medical History:  Diagnosis Date  . Acute appendicitis 09/09/2012  . Anxiety   . Back pain    Occ. issues wih back pain  . Back pain 09/09/2012   It is intermittent now,  previously source of fibromyalgia dx.    Marland Kitchen CRPS (complex regional pain syndrome type I)   . CRPS (complex regional pain syndrome), lower limb 2018  . Depression   . Fibromyalgia   . Headache   . Hypothyroid 09/09/2012  . Hypothyroidism   . Osteoarthritis   . Precancerous changes of the cervix   . Sleep apnea    pre lap banding-never used cpap or mask  . Vitamin D deficiency     Surgeries: Procedure(s): ANTERIOR CERVICAL DECOMPRESSION/DISCECTOMY CERVICAL FIVE-SIX, CERVICAL SIX-SEVEN on 10/27/2018   Consultants:   Discharged Condition: Improved  Hospital Course: Neyva Frankum is an 43 y.o. 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  Right radicular arm pain C6 and C7 dermatome. Patient failed conservative treatments (please see the history and physical for the specifics) and had severe unremitting pain that affects sleep, daily activities and work/hobbies. After pre-op clearance, the patient was taken to the operating room on 10/27/2018 and underwent  Procedure(s): ANTERIOR CERVICAL DECOMPRESSION/DISCECTOMY CERVICAL FIVE-SIX, CERVICAL SIX-SEVEN.    Patient was given perioperative antibiotics:  Anti-infectives (From admission, onward)   Start     Dose/Rate Route Frequency Ordered Stop   10/27/18 2000  ceFAZolin (ANCEF) IVPB 1 g/50 mL  premix     1 g 100 mL/hr over 30 Minutes Intravenous Every 8 hours 10/27/18 1716 10/28/18 0354   10/27/18 1037  ceFAZolin (ANCEF) 2-4 GM/100ML-% IVPB    Note to Pharmacy: Alvy Beal   : cabinet override      10/27/18 1037 10/27/18 1222   10/27/18 1032  ceFAZolin (ANCEF) IVPB 2g/100 mL premix     2 g 200 mL/hr over 30 Minutes Intravenous 30 min pre-op 10/27/18 1032 10/27/18 1237       Patient was given sequential compression devices and early ambulation to prevent DVT.   Patient benefited maximally from hospital stay and there were no complications. At the time of discharge, the patient was urinating/moving their bowels without difficulty, tolerating a regular diet, pain is controlled with oral pain medications and they have been cleared by PT/OT.   Recent vital signs: No data found.   Recent laboratory studies: No results for input(s): WBC, HGB, HCT, PLT, NA, K, CL, CO2, BUN, CREATININE, GLUCOSE, INR, CALCIUM in the last 72 hours.  Invalid input(s): PT, 2   Discharge Medications:   Allergies as of 10/28/2018      Reactions   Paroxetine Hcl Rash   Duloxetine Nausea And Vomiting   Percocet [oxycodone-acetaminophen] Itching      Medication List    STOP taking these medications   Excedrin Migraine 250-250-65 MG tablet Generic drug: aspirin-acetaminophen-caffeine   ketorolac 10 MG tablet Commonly known as: TORADOL   Oxycodone HCl 10 MG Tabs     TAKE these medications   estradiol 0.1 MG/GM vaginal cream Commonly known as: ESTRACE 1 gram vaginally twice weekly  famotidine 20 MG tablet Commonly known as: PEPCID Take 20 mg by mouth daily as needed for heartburn or indigestion.   gabapentin 300 MG capsule Commonly known as: NEURONTIN Take 300 mg by mouth 3 (three) times daily as needed (pain).   hydroxypropyl methylcellulose / hypromellose 2.5 % ophthalmic solution Commonly known as: ISOPTO TEARS / GONIOVISC Place 1 drop into both eyes 3 (three) times daily as  needed for dry eyes.   levothyroxine 125 MCG tablet Commonly known as: SYNTHROID Take 1 tablet (125 mcg total) by mouth daily.   liothyronine 5 MCG tablet Commonly known as: CYTOMEL Take 3 tablets (15 mcg total) by mouth daily. Take 2 tablets at breakfast and take one tablet at lunch What changed:   how much to take  when to take this  additional instructions   NONFORMULARY OR COMPOUNDED ITEM Estradiol cream 0.02% in 7ml prefilled syringes.  49ml pv twice weekly.  #40month supply. What changed:   how much to take  how to take this  when to take this   ondansetron 4 MG tablet Commonly known as: Zofran Take 1 tablet (4 mg total) by mouth every 8 (eight) hours as needed for nausea or vomiting.   Premarin vaginal cream Generic drug: conjugated estrogens 1/2 gram vaginally twice weekly   Senokot Extra Strength 17.2 MG Tabs Generic drug: Sennosides Take 17.2 mg by mouth daily.   sertraline 100 MG tablet Commonly known as: ZOLOFT Take 100 mg by mouth at bedtime.   SUMAtriptan 25 MG tablet Commonly known as: IMITREX Take 25 mg by mouth at bedtime as needed for migraine.   Testosterone 10 MG/ACT (2%) Gel Apply 1 Pump topically at bedtime.   topiramate 50 MG tablet Commonly known as: TOPAMAX Take 50 mg by mouth at bedtime.   Vitamin D (Ergocalciferol) 1.25 MG (50000 UT) Caps capsule Commonly known as: DRISDOL TAKE ONE CAPSULE EVERY 7 DAYS     ASK your doctor about these medications   methocarbamol 500 MG tablet Commonly known as: Robaxin Take 1 tablet (500 mg total) by mouth every 8 (eight) hours as needed for up to 5 days for muscle spasms. Ask about: Should I take this medication?   oxyCODONE-acetaminophen 10-325 MG tablet Commonly known as: Percocet Take 1 tablet by mouth every 6 (six) hours as needed for up to 5 days for pain. Ask about: Should I take this medication?       Diagnostic Studies: Dg Chest 2 View  Result Date: 10/20/2018 CLINICAL DATA:   Preoperative assessment for neck regions surgery. Hypertension. History of tobacco use. EXAM: CHEST - 2 VIEW COMPARISON:  May 07, 2015 FINDINGS: Lungs are clear. Heart size and pulmonary vascularity are normal. No adenopathy. No bone lesions. Lap band positioned at gastric cardia level. IMPRESSION: No edema or consolidation. Lap band positioned at gastric cardia level. Electronically Signed   By: Lowella Grip III M.D.   On: 10/20/2018 09:43   Dg Cervical Spine 2-3 Views  Result Date: 10/27/2018 CLINICAL DATA:  ACDF C5-7 EXAM: CERVICAL SPINE - 2-3 VIEW; DG C-ARM 1-60 MIN COMPARISON:  None FINDINGS: Intraoperative fluoroscopic spot images are provided from anterior cervical fusion. Anterior cervical fusion with interbody spacers from C5 through C7. Satisfactory position. Endotracheal tube is present. IMPRESSION: Interval ACDF C5-C7. Electronically Signed   By: Kathreen Devoid   On: 10/27/2018 17:09   Dg C-arm 1-60 Min  Result Date: 10/27/2018 CLINICAL DATA:  ACDF C5-7 EXAM: CERVICAL SPINE - 2-3 VIEW; DG C-ARM 1-60 MIN  COMPARISON:  None FINDINGS: Intraoperative fluoroscopic spot images are provided from anterior cervical fusion. Anterior cervical fusion with interbody spacers from C5 through C7. Satisfactory position. Endotracheal tube is present. IMPRESSION: Interval ACDF C5-C7. Electronically Signed   By: Kathreen Devoid   On: 10/27/2018 17:09    Discharge Instructions    Incentive spirometry RT   Complete by: As directed       Follow-up Information    Melina Schools, MD. Schedule an appointment as soon as possible for a visit in 2 weeks.   Specialty: Orthopedic Surgery Why: If symptoms worsen, For suture removal, For wound re-check Contact information: [address] [place] [address] [id number]           Discharge Plan:  discharge to home  Disposition: stable    Signed: Yvonne Kendall Ward for [facility] PA-C [facility] [phone] 11/04/2018,  10:29 AM

## 2018-11-22 ENCOUNTER — Other Ambulatory Visit: Payer: Self-pay

## 2018-11-22 ENCOUNTER — Encounter: Payer: Self-pay | Admitting: Psychology

## 2018-11-22 ENCOUNTER — Encounter: Payer: No Typology Code available for payment source | Attending: Psychology | Admitting: Psychology

## 2018-11-22 DIAGNOSIS — G90513 Complex regional pain syndrome I of upper limb, bilateral: Secondary | ICD-10-CM | POA: Insufficient documentation

## 2018-11-22 DIAGNOSIS — G894 Chronic pain syndrome: Secondary | ICD-10-CM | POA: Insufficient documentation

## 2018-11-22 DIAGNOSIS — F32A Depression, unspecified: Secondary | ICD-10-CM

## 2018-11-22 DIAGNOSIS — M797 Fibromyalgia: Secondary | ICD-10-CM | POA: Diagnosis present

## 2018-11-22 DIAGNOSIS — F419 Anxiety disorder, unspecified: Secondary | ICD-10-CM

## 2018-11-22 DIAGNOSIS — F329 Major depressive disorder, single episode, unspecified: Secondary | ICD-10-CM | POA: Diagnosis present

## 2018-11-22 NOTE — Progress Notes (Signed)
Patient:  Sally [name]   DOB: 05-02-1975  MR Number: [id number]  Location: [facility] [place] [facility] AND RE[facility]ATION [place], [id number] [place] [id number] Dept: [phone]  Start: 3 PM End: 4 PM  This visit was an in person visit that was conducted in my outpatient clinic office with the patient myself present.  It lasted for 1 hour.   Provider/Observer:     Edgardo Roys PsyD  Chief Complaint:      Chief Complaint  Patient presents with  . Anxiety  . Depression  . Pain    Reason For Service:     Sally [name] is a 43 year old female referred by [place] for therapeutic interventions due to chronic pain symptoms.  The patient reports that she has great difficulty standing, walking for long times, elbow pain, migraines, hip pain, shoulder and neck pain.  The patient fell at work and fractured both her elbows and also had knee replacement surgery.  The patient has been taking maintenance doses of opioid pain medications for some time.  The patient reports that she has a great deal of difficulty functioning and has very poor sleep and wakes up numerous times each night.  The patient also has dealt with depression and anxiety for many years and has been hospitalized for depression anxiety years ago.  She has been receiving counseling for anxiety and depression and takes Trintellix.  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  The patient describes chronic issues of depression anxiety but the development of severe chronic pain.   The above reason for service was  reviewed for this visit remains applicable.  The patient reports that she is continuing to have significant pain associated with nerve root impingement in her neck and continues to have symptoms related to chronic complex regional pain syndrome and fibromyalgia type symptoms.  Interventions Strategy:  Cognitive/behavioral therapeutic interventions along with coping skills and strategies around chronic pain, sleep disturbance and depression/anxiety.  Participation Level:   Active  Participation Quality:  Appropriate and Attentive      Behavioral Observation:  Well Groomed, Alert, and Appropriate.   Current Psychosocial Factors: The patient reports that her anxiety and depression have stabilized for the most part and she is coping adequately with her significant pain.  However, this pain level and physical difficulties are having a significant deleterious effect on her ability to manage and cope with her life.  The patient is not able to work now and is had difficulty even trying to take care of and help with childcare for her sister's children.  This had to be discontinued.   Content of Session:   Reviewed current symptoms and continue to work on therapeutic interventions around issues related to her chronic pain and depression as well as issues related to her chronic complex regional pain syndrome and fibromyalgia symptoms.   Current Status:   The patient reports that she has just had cervical neck surgery with fusion.  The patient reports that she has continued to have some pain but some of the issues in her hands do not appear changed so far but  they may be related to complex regional pain syndrome.  The patient reports that the pain from surgery itself has improved but she is continue to have some issues with swallowing.  The patient reports that he continues to be a lot of stressors between her and her husband related to changes in function.  Patient Progress:   The patient reports that she has  been able to improve her overall sleep pattern and her depressive symptoms have been improving although with the recent changes around her family related to COVID-19 she is having to do more childcare types of activities which are very difficult for her.  Impression/Diagnosis:   Sally [name] is a 43 year old female referred by [place] for therapeutic interventions due to chronic pain symptoms.  The patient reports that she has great difficulty standing, walking for long times, elbow pain, migraines, hip pain, shoulder and neck pain.  The patient fell at work and fractured both her elbows and also had knee replacement surgery.  The patient has been taking maintenance doses of opioid pain medications for some time.  The patient reports that she has a great deal of difficulty functioning and has very poor sleep and wakes up numerous times each night.  The patient also has dealt with depression and anxiety for many years and has been hospitalized for depression anxiety years ago.  She has been receiving counseling for anxiety and depression and takes Trintellix.  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  The patient describes chronic issues of depression anxiety but the development of severe chronic pain  After a fall at work in 2015 where she fractured both of her elbows, injured her knee and developed fibromyalgia/regional pain syndrome.  The patient reports that she has ongoing difficulties walking distances or standing.  She describes elbow pain, trouble lifting things and trouble writing for any period of time.  The patient describes significant sleep disturbance and is unable to fall  asleep at night.  She reports that she is always tired.  The patient describes a normal appetite.  The patient describes memory issues related to trouble remembering things and concentration issues.  The patient reports that she is significantly less active with her children and husband and that her husband has to help more due to her significant pain.  The above impression/diagnoses remain accurate for the current visit.  I reviewed these for this visit and the patient continues to have significant fibromyalgia and chronic pain symptoms.  Diagnosis: Fibromyalgia, anxiety and depression, neck, shoulder and back pain.

## 2018-12-23 ENCOUNTER — Encounter: Payer: No Typology Code available for payment source | Attending: Psychology | Admitting: Psychology

## 2018-12-23 ENCOUNTER — Other Ambulatory Visit: Payer: Self-pay

## 2018-12-23 DIAGNOSIS — G894 Chronic pain syndrome: Secondary | ICD-10-CM

## 2018-12-23 DIAGNOSIS — F32A Depression, unspecified: Secondary | ICD-10-CM

## 2018-12-23 DIAGNOSIS — F419 Anxiety disorder, unspecified: Secondary | ICD-10-CM | POA: Diagnosis not present

## 2018-12-23 DIAGNOSIS — M797 Fibromyalgia: Secondary | ICD-10-CM | POA: Diagnosis present

## 2018-12-23 DIAGNOSIS — G90513 Complex regional pain syndrome I of upper limb, bilateral: Secondary | ICD-10-CM | POA: Diagnosis present

## 2018-12-23 DIAGNOSIS — F329 Major depressive disorder, single episode, unspecified: Secondary | ICD-10-CM | POA: Diagnosis present

## 2019-01-20 ENCOUNTER — Encounter: Payer: Self-pay | Admitting: Psychology

## 2019-01-20 NOTE — Progress Notes (Signed)
Patient:  Sally [name]   DOB: 11-27-1975  MR Number: [id number]  Location: [facility] [place] [facility] MEDICINE AND REHABILITATION [place], [id number] [place]  [id number] Dept: [phone]  Start: 2 PM End: 3 PM  Today's visit was an in person visit that was 1 hour long and was conducted in my outpatient clinic office.  The patient myself were present for this visit.   Provider/Observer:     Edgardo Roys PsyD  Chief Complaint:      Chief Complaint  Patient presents with  . Anxiety  . Depression  . Pain    Reason For Service:     Sally [name] is a 43 year old female referred by [place] for therapeutic interventions due to chronic pain symptoms.  The patient reports that she has great difficulty standing, walking for long times, elbow pain, migraines, hip pain, shoulder and neck pain.  The patient fell at work and fractured both her elbows and also had knee replacement surgery.  The patient has been taking maintenance doses of opioid pain medications for some time.  The patient reports that she has a great deal of difficulty functioning and has very poor sleep and wakes up numerous times each night.  The patient also has dealt with depression and anxiety for many years and has been hospitalized for depression anxiety years ago.  She has been receiving counseling for anxiety and depression and takes Trintellix.  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  The patient describes chronic issues of depression anxiety but the development of severe chronic pain.  The above reason for  service has been reviewed for this visit remains applicable for the current visit.  The patient reports that she is adjusting and recovering from her neck surgery and pain associated with cervical nerve root impingement.  The patient reports that she has experienced some improvement related to arm pain but continues to have significant pain related to her fibromyalgia and regional pain syndrome.  Interventions Strategy:  Cognitive/behavioral therapeutic interventions along with coping skills and strategies around chronic pain, sleep disturbance and depression/anxiety.  Participation Level:   Active  Participation Quality:  Appropriate and Attentive      Behavioral Observation:  Well Groomed, Alert, and Appropriate.   Current Psychosocial Factors: The patient reports that there continue to be significant psychosocial stressors related to her inability to function and do much around the house.  The patient reports that she gets little help from her husband and he is hesitant or resistant to acknowledge her difficulties and facilitate some of the tasks she has around the house which exacerbate her pain and difficulties.   Content of Session:   Reviewed current symptoms and continue to work on therapeutic interventions around issues related to her chronic pain and depression as well as issues related to her chronic complex regional pain syndrome and fibromyalgia symptoms.   Current Status:   The patient reports that she has just had cervical neck surgery with fusion.  The patient reports that she has continued to have some pain but some of the issues in her hands do not appear changed  so far but they may be related to complex regional pain syndrome.  The patient reports that the pain from surgery itself has improved but she is continue to have some issues with swallowing.  The patient reports that he continues to be a lot of stressors between her and her husband related to changes in function.  Patient  Progress:   The patient reports that her overall sleep has improved from initial status but continues to be problematic.  The patient reports that her vein plays a big role in her difficulty sleeping.  Depression anxiety continue to be quite problematic for the patient.  Impression/Diagnosis:   Sally [name] is a 43 year old female referred by [place] for therapeutic interventions due to chronic pain symptoms.  The patient reports that she has great difficulty standing, walking for long times, elbow pain, migraines, hip pain, shoulder and neck pain.  The patient fell at work and fractured both her elbows and also had knee replacement surgery.  The patient has been taking maintenance doses of opioid pain medications for some time.  The patient reports that she has a great deal of difficulty functioning and has very poor sleep and wakes up numerous times each night.  The patient also has dealt with depression and anxiety for many years and has been hospitalized for depression anxiety years ago.  She has been receiving counseling for anxiety and depression and takes Trintellix.  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  The patient describes chronic issues of depression anxiety but the development of severe chronic pain  After a fall at work in 2015 where she fractured both of her elbows, injured her knee and developed fibromyalgia/regional pain syndrome.  The patient reports that she has ongoing difficulties walking distances or standing.  She describes elbow pain, trouble lifting things and trouble writing for any period of time.  The patient describes significant sleep disturbance and is  unable to fall asleep at night.  She reports that she is always tired.  The patient describes a normal appetite.  The patient describes memory issues related to trouble remembering things and concentration issues.  The patient reports that she is significantly less active with her children and husband and that her husband has to help more due to her significant pain.  The above impression/diagnoses remain accurate for the current visit.  I reviewed these for this visit and the patient continues to have significant fibromyalgia and chronic pain symptoms.  Diagnosis: Fibromyalgia, anxiety and depression, neck, shoulder and back pain.

## 2019-02-21 ENCOUNTER — Ambulatory Visit (INDEPENDENT_AMBULATORY_CARE_PROVIDER_SITE_OTHER): Payer: Medicare Other | Admitting: Otolaryngology

## 2019-02-21 ENCOUNTER — Encounter (INDEPENDENT_AMBULATORY_CARE_PROVIDER_SITE_OTHER): Payer: Self-pay | Admitting: Otolaryngology

## 2019-02-21 ENCOUNTER — Other Ambulatory Visit: Payer: Self-pay

## 2019-02-21 VITALS — Temp 97.7°F

## 2019-02-21 DIAGNOSIS — R1314 Dysphagia, pharyngoesophageal phase: Secondary | ICD-10-CM

## 2019-02-21 DIAGNOSIS — K219 Gastro-esophageal reflux disease without esophagitis: Secondary | ICD-10-CM

## 2019-02-21 NOTE — Progress Notes (Signed)
HPI: Sally [name] is a 44 y.o. female who presents is referred by Dr. Rolena Infante for evaluation of dysphagia.  She is status post previous anterior cervical decompression October 27, 2018.  She had some difficulty swallowing immediately following surgery.  But this has persisted and she is still having difficulty swallowing with pills and food getting caught and points to the upper cervical esophagus and sometimes more mid chest level.  But she states that the food and pills seem to get lodged more in the region of the cricoid cartilage or upper cervical esophagus.  Sometimes she has to eat soups because of difficulty swallowing.  Denies symptoms of GE reflux disease.. Denies sore throat.  Past Medical History:  Diagnosis Date  . Acute appendicitis 09/09/2012  . Anxiety   . Back pain    Occ. issues wih back pain  . Back pain 09/09/2012   It is intermittent now,  previously source of fibromyalgia dx.    Marland Kitchen CRPS (complex regional pain syndrome type I)   . CRPS (complex regional pain syndrome), lower limb 2018  . Depression   . Fibromyalgia   . Headache   . Hypothyroid 09/09/2012  . Hypothyroidism   . Osteoarthritis   . Precancerous changes of the cervix   . Sleep apnea    pre lap banding-never used cpap or mask  . Vitamin D deficiency    Past Surgical History:  Procedure Laterality Date  . ANTERIOR CERVICAL DECOMP/DISCECTOMY FUSION N/A 10/27/2018   Procedure: ANTERIOR CERVICAL DECOMPRESSION/DISCECTOMY CERVICAL FIVE-SIX, CERVICAL SIX-SEVEN;  Surgeon: Melina Schools, MD;  Location: [facility];  Service: Orthopedics;  Laterality: N/A;  . BILATERAL SALPINGECTOMY Bilateral 11/05/2015   Procedure: BILATERAL SALPINGECTOMY;  Surgeon: [name], MD;  Location: [facility];  Service: Gynecology;  Laterality: Bilateral;  . BRAIN SURGERY  1992   remove blood clot after a fall  . CESAREAN SECTION  1997, 2013  . cyst on ovary    . CYSTOSCOPY N/A 11/05/2015   Procedure: CYSTOSCOPY;  Surgeon: [name],  MD;  Location: [facility];  Service: Gynecology;  Laterality: N/A;  . GASTRIC BANDING PORT REVISION  01/20/2012   Procedure: GASTRIC BANDING PORT REVISION;  Surgeon: Pedro Earls, MD;  Location: [facility];  Service: General;  Laterality: N/A;  . KNEE ARTHROSCOPY Left   . KNEE ARTHROSCOPY WITH DRILLING/MICROFRACTURE Right 12/06/2014   Procedure: KNEE ARTHROSCOPY WITH DRILLING/MICROFRACTURE, CHONDROPLASTY, EXCISION OF PLICA;  Surgeon: Dorna Leitz, MD;  Location: [facility];  Service: Orthopedics;  Laterality: Right;  . KNEE SURGERY Right 06/26/2016   [facility]  . LAPAROSCOPIC APPENDECTOMY N/A 09/09/2012   Procedure: APPENDECTOMY LAPAROSCOPIC;  Surgeon: Merrie Roof, MD;  Location: [facility];  Service: General;  Laterality: N/A;  . [place]  . LAPAROSCOPIC HYSTERECTOMY N/A 11/05/2015   Procedure: HYSTERECTOMY TOTAL LAPAROSCOPIC;  Surgeon: [name], MD;  Location: [facility];  Service: Gynecology;  Laterality: N/A;  . LAPAROSCOPY  08/19/2010   Procedure: LAPAROSCOPY OPERATIVE;  Surgeon: Felipa Emory;  Location: [facility];  Service: Gynecology;  Laterality: N/A;  with Biopsy of uterine serosa  . TUBAL LIGATION  08/2011   Social History   Socioeconomic History  . Marital status: Married    Spouse name: Not on file  . Number of children: Not on file  . Years of education: Not on file  . Highest education level: Not on file  Occupational History  . Not on file  Tobacco  Use  . Smoking status: Current Every Day Smoker    Packs/day: 1.00    Years: 21.00    Pack years: 21.00    Types: Cigarettes    Start date: 2000  . Smokeless tobacco: Never Used  Substance and Sexual Activity  . Alcohol use: Not Currently    Comment: occ  . Drug use: No  . Sexual activity: Yes    Partners: Male    Birth control/protection: Surgical    Comment: [facility]  Other Topics Concern  . Not on file  Social History Narrative  . Not on file   Social  Determinants of Health   Financial Resource Strain:   . Difficulty of Paying Living Expenses: Not on file  Food Insecurity:   . Worried About Charity fundraiser in the Last Year: Not on file  . Ran Out of Food in the Last Year: Not on file  Transportation Needs:   . Lack of Transportation (Medical): Not on file  . Lack of Transportation (Non-Medical): Not on file  Physical Activity:   . Days of Exercise per Week: Not on file  . Minutes of Exercise per Session: Not on file  Stress:   . Feeling of Stress : Not on file  Social Connections:   . Frequency of Communication with Friends and Family: Not on file  . Frequency of Social Gatherings with Friends and Family: Not on file  . Attends Religious Services: Not on file  . Active Member of Clubs or Organizations: Not on file  . Attends Archivist Meetings: Not on file  . Marital Status: Not on file   Family History  Problem Relation Age of Onset  . Hypertension Maternal Grandfather   . Diabetes Paternal Grandmother   . Anesthesia problems Neg Hx    Allergies  Allergen Reactions  . Paroxetine Hcl Rash  . Duloxetine Nausea And Vomiting  . Percocet [Oxycodone-Acetaminophen] Itching   Prior to Admission medications   Medication Sig Start Date End Date Taking? Authorizing Provider  conjugated estrogens (PREMARIN) vaginal cream 1/2 gram vaginally twice weekly 09/13/18  Yes [name], MD  estradiol (ESTRACE) 0.1 MG/GM vaginal cream 1 gram vaginally twice weekly 09/13/18  Yes [name], MD  famotidine (PEPCID) 20 MG tablet Take 20 mg by mouth daily as needed for heartburn or indigestion.   Yes [provider]  gabapentin (NEURONTIN) 300 MG capsule Take 300 mg by mouth 3 (three) times daily as needed (pain).    Yes [provider]  hydroxypropyl methylcellulose / hypromellose (ISOPTO TEARS / GONIOVISC) 2.5 % ophthalmic solution Place 1 drop into both eyes 3 (three) times daily as needed for dry eyes.    Yes [provider]  levothyroxine (SYNTHROID, LEVOTHROID) 125 MCG tablet Take 1 tablet (125 mcg total) by mouth daily. 09/19/16  Yes Elayne Snare, MD  liothyronine (CYTOMEL) 5 MCG tablet Take 3 tablets (15 mcg total) by mouth daily. Take 2 tablets at breakfast and take one tablet at lunch Patient taking differently: Take 5-10 mcg by mouth See admin instructions. Take 10 mcg by mouth in the morning and 5 mcg in the evening 09/17/16  Yes Elayne Snare, MD  NONFORMULARY OR COMPOUNDED ITEM Estradiol cream 0.02% in 38ml prefilled syringes.  44ml pv twice weekly.  #60month supply. Patient taking differently: Place 1 Applicatorful vaginally 2 (two) times a week. Estradiol cream 0.02% in 52ml prefilled syringes.  25ml pv twice weekly.  #4month supply. 09/29/18  Yes Hale Bogus  S, MD  ondansetron (ZOFRAN) 4 MG tablet Take 1 tablet (4 mg total) by mouth every 8 (eight) hours as needed for nausea or vomiting. 10/27/18  Yes Melina Schools, MD  Sennosides [facility] EXTRA STRENGTH) 17.2 MG TABS Take 17.2 mg by mouth daily.   Yes [provider]  sertraline (ZOLOFT) 100 MG tablet Take 100 mg by mouth at bedtime.  05/21/18  Yes [provider]  SUMAtriptan (IMITREX) 25 MG tablet Take 25 mg by mouth at bedtime as needed for migraine.  08/09/18  Yes [provider]  Testosterone 10 MG/ACT (2%) GEL Apply 1 Pump topically at bedtime.  09/06/18  Yes [provider]  topiramate (TOPAMAX) 50 MG tablet Take 50 mg by mouth at bedtime.  08/31/18  Yes [provider]  Vitamin D, Ergocalciferol, (DRISDOL) 50000 units CAPS capsule TAKE ONE CAPSULE EVERY 7 DAYS 08/21/16  Yes [name], MD     Positive ROS: Otherwise negative.  All other systems have been reviewed and were otherwise negative with the exception of those mentioned in the HPI and as above.  Physical Exam: Constitutional: Alert, well-appearing, no acute distress Ears: External ears without lesions or tenderness. Ear canals  are clear bilaterally with intact, clear TMs.  Nasal: External nose without lesions.. Clear nasal passages Oral: Lips and gums without lesions. Tongue and palate mucosa without lesions. Posterior oropharynx clear. Fiberoptic laryngoscopy was performed through the right nostril.  The nasopharynx was clear.  The base of tongue vallecula and epiglottis were normal.  No hypopharyngeal or laryngeal lesions noted.  Vocal cords were clear with normal vocal cord mobility.  Both piriform sinuses were clear.  I was able to pass the fiberoptic laryngoscope through the upper cervical esophagus without difficulty and the upper cervical esophagus was clear. Neck: No palpable adenopathy or masses.  Well-healed left neck scar from anterior cervical surgery on 10/27/2018. Respiratory: Breathing comfortably  Skin: No facial/neck lesions or rash noted.  Laryngoscopy  Date/Time: 02/21/2019 6:26 PM Performed by: Rozetta Nunnery, MD Authorized by: Rozetta Nunnery, MD   Consent:    Consent obtained:  Verbal   Consent given by:  Patient   Risks discussed:  Pain Procedure details:    Indications: direct visualization of the upper aerodigestive tract     Medication:  Afrin   Instrument: flexible fiberoptic laryngoscope     Scope location: right nare   Mouth:    Oropharynx: normal     Vallecula: normal     Base of tongue: normal     Epiglottis: normal   Throat:    Pyriform sinus: normal     True vocal cords: normal   Comments:     Clear upper airway examination on fiberoptic laryngoscopy.  She had moderate edema of the arytenoid mucosa which may be indicative of laryngeal pharyngeal reflux disease although she denies reflux problems.    Assessment: Dysphagia questionable etiology. 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  Plan: Placed her on omeprazole 40 mg daily before dinner We will schedule her for a barium  swallow to rule out any structural abnormalities of the cervical esophagus as she states pills get stuck here. She will call us concerning the results following the barium swallow.   Radene Journey, MD   CC:

## 2019-02-24 ENCOUNTER — Telehealth (INDEPENDENT_AMBULATORY_CARE_PROVIDER_SITE_OTHER): Payer: Self-pay

## 2019-03-03 ENCOUNTER — Other Ambulatory Visit: Payer: Self-pay | Admitting: *Deleted

## 2019-03-08 ENCOUNTER — Ambulatory Visit: Payer: Medicare Other | Admitting: Diagnostic Neuroimaging

## 2019-03-08 ENCOUNTER — Encounter: Payer: Self-pay | Admitting: Diagnostic Neuroimaging

## 2019-03-08 ENCOUNTER — Other Ambulatory Visit: Payer: Self-pay

## 2019-03-08 VITALS — BP 134/91 | HR 82 | Temp 97.3°F | Ht 68.0 in | Wt 252.0 lb

## 2019-03-08 DIAGNOSIS — G894 Chronic pain syndrome: Secondary | ICD-10-CM

## 2019-03-08 NOTE — Patient Instructions (Signed)
CHRONIC PAIN SYNDROME (fibromyalgia, CRPS, depression, fatigue) - check MRI brain (rule out secondary causes) - consider psychiatry, psychology evaluation - consider sleep apnea evaluation and treatment - continue pain mgmt
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
## 2019-03-09 ENCOUNTER — Telehealth: Payer: Self-pay | Admitting: Diagnostic Neuroimaging

## 2019-03-09 NOTE — Telephone Encounter (Signed)
[employer] medcare order sent to [facility]. No auth they will reach out to the patient to schedule.

## 2019-04-05 ENCOUNTER — Ambulatory Visit
Admission: RE | Admit: 2019-04-05 | Discharge: 2019-04-05 | Disposition: A | Discharge status: Home / Self Care | Payer: Medicare Other | Source: Ambulatory Visit | Attending: Diagnostic Neuroimaging | Admitting: Diagnostic Neuroimaging

## 2019-04-05 DIAGNOSIS — G894 Chronic pain syndrome: Secondary | ICD-10-CM

## 2019-04-05 MED ORDER — GADOBENATE DIMEGLUMINE 529 MG/ML IV SOLN
20.0000 mL | Freq: Once | INTRAVENOUS | Status: AC | PRN
  Administered 2019-04-05: 20 mL via INTRAVENOUS

## 2019-04-06 ENCOUNTER — Encounter: Payer: No Typology Code available for payment source | Attending: Psychology | Admitting: Psychology

## 2019-04-06 ENCOUNTER — Encounter: Payer: Self-pay | Admitting: Psychology

## 2019-04-06 ENCOUNTER — Other Ambulatory Visit: Payer: Self-pay

## 2019-04-06 DIAGNOSIS — F329 Major depressive disorder, single episode, unspecified: Secondary | ICD-10-CM | POA: Diagnosis present

## 2019-04-06 DIAGNOSIS — M797 Fibromyalgia: Secondary | ICD-10-CM | POA: Diagnosis present

## 2019-04-06 DIAGNOSIS — F419 Anxiety disorder, unspecified: Secondary | ICD-10-CM | POA: Insufficient documentation

## 2019-04-06 DIAGNOSIS — G90513 Complex regional pain syndrome I of upper limb, bilateral: Secondary | ICD-10-CM | POA: Diagnosis present

## 2019-04-06 DIAGNOSIS — G894 Chronic pain syndrome: Secondary | ICD-10-CM | POA: Diagnosis present

## 2019-04-06 DIAGNOSIS — F32A Depression, unspecified: Secondary | ICD-10-CM

## 2019-04-06 NOTE — Progress Notes (Signed)
Patient:  Sally [name]   DOB: 1975/11/11  MR Number: [id number]  Location: [facility] [place] [facility] [place], [address] [id number] [place] [id number] Dept: [phone]  Start: 8 AM End: 9 AM  Today's visit was an in person visit that was conducted in my outpatient clinic office.  It lasted for 1 hour.  The patient along with myself were present for this visit.   Provider/Observer:     Sally Roys PsyD  Chief Complaint:      Chief Complaint  Patient presents with  . Pain  . Anxiety  . Depression  . Stress    Reason For Service:     Sally [name] is a 44 year old female referred by [facility] for therapeutic interventions due to chronic pain symptoms.  The patient reports that she has great difficulty standing, walking for long times, elbow pain, migraines, hip pain, shoulder and neck pain.  The patient fell at work and fractured both her elbows and also had knee replacement surgery.  The patient has been taking maintenance doses of opioid pain medications for some time.  The patient reports that she has a great deal of difficulty functioning and has very poor sleep and wakes up numerous times each night.  The patient also has dealt with depression and anxiety for many years and has been hospitalized for depression anxiety years ago.  She has been receiving counseling for anxiety and depression and takes Trintellix.  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  The patient describes chronic issues of depression anxiety but the development of severe chronic  pain.  The patient reports that she is continuing to have some pain in her back post surgery and had a recent fall.  Follow-up MRI did not suggest any structural damage or injury from this fall.  The patient reports that her symptoms associated with complex regional pain syndrome have worsened recently.  Interventions Strategy:  Cognitive/behavioral therapeutic interventions along with coping skills and strategies around chronic pain, sleep disturbance and depression/anxiety.  Participation Level:   Active  Participation Quality:  Appropriate and Attentive      Behavioral Observation:  Well Groomed, Alert, and Appropriate.   Current Psychosocial Factors: The patient reports that there continue to be significant psychosocial stressors related to her inability to function and do much around the house.  The patient reports that she gets little help from her husband and he is hesitant or resistant to acknowledge her difficulties and facilitate some of the tasks she has around the house which exacerbate her pain and difficulties.   Content of Session:   Reviewed current symptoms and continue to work on therapeutic interventions around issues related to her chronic pain and depression as well as issues related to her chronic complex regional pain syndrome and fibromyalgia symptoms.   Current Status:   The patient reports that she has struggled with continued pain since her surgery.  She reports that initially it improved somewhat but then she had a fall and she started having more pain post surgery and post fall.  The patient reports that her depression has improved  somewhat and that her sleep is still mildly improved but continues to be problematic  Patient Progress:   The patient reports that her overall sleep has improved from initial status but continues to be problematic.  The patient reports that her vein plays a big role in her difficulty sleeping.  Depression anxiety continue to be quite  problematic for the patient.  Impression/Diagnosis:   Sally [name] is a 44 year old female referred by [facility] for therapeutic interventions due to chronic pain symptoms.  The patient reports that she has great difficulty standing, walking for long times, elbow pain, migraines, hip pain, shoulder and neck pain.  The patient fell at work and fractured both her elbows and also had knee replacement surgery.  The patient has been taking maintenance doses of opioid pain medications for some time.  The patient reports that she has a great deal of difficulty functioning and has very poor sleep and wakes up numerous times each night.  The patient also has dealt with depression and anxiety for many years and has been hospitalized for depression anxiety years ago.  She has been receiving counseling for anxiety and depression and takes Trintellix.  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  The patient describes chronic issues of depression anxiety but the development of severe chronic pain  After a fall at work in 2015 where she fractured both of her elbows, injured her knee and developed fibromyalgia/regional pain syndrome.  The patient reports that she has ongoing difficulties walking distances or standing.  She describes elbow pain, trouble lifting things and trouble writing for any period of time.  The patient describes significant sleep disturbance and is unable to fall asleep at night.  She reports that she is always tired.  The patient describes a normal appetite.  The patient describes memory issues related to trouble remembering things and concentration issues.  The patient reports that  she is significantly less active with her children and husband and that her husband has to help more due to her significant pain.  The above impression/diagnoses remain accurate for the current visit.  I reviewed these for this visit and the patient continues to have significant fibromyalgia and chronic pain symptoms.  Diagnosis: Fibromyalgia, anxiety and depression, neck, shoulder and back pain.

## 2019-04-11 ENCOUNTER — Telehealth: Payer: Self-pay | Admitting: *Deleted

## 2019-04-11 NOTE — Telephone Encounter (Signed)
Spoke with patient and informed her that MRI brain showed unremarkable imaging results. He noted prior surgery changes, but no new major findings. He advises she continue current plan. She  verbalized understanding, appreciation.

## 2019-05-11 ENCOUNTER — Encounter: Payer: Medicare Other | Admitting: Psychology

## 2019-05-26 NOTE — Telephone Encounter (Signed)
Opened in error

## 2019-06-14 ENCOUNTER — Encounter: Payer: Self-pay | Admitting: Psychology

## 2019-06-14 ENCOUNTER — Encounter: Payer: No Typology Code available for payment source | Attending: Psychology | Admitting: Psychology

## 2019-06-14 ENCOUNTER — Other Ambulatory Visit: Payer: Self-pay

## 2019-06-14 DIAGNOSIS — G894 Chronic pain syndrome: Secondary | ICD-10-CM | POA: Diagnosis not present

## 2019-06-14 DIAGNOSIS — F419 Anxiety disorder, unspecified: Secondary | ICD-10-CM

## 2019-06-14 DIAGNOSIS — M797 Fibromyalgia: Secondary | ICD-10-CM

## 2019-06-14 DIAGNOSIS — G90513 Complex regional pain syndrome I of upper limb, bilateral: Secondary | ICD-10-CM

## 2019-06-14 DIAGNOSIS — F329 Major depressive disorder, single episode, unspecified: Secondary | ICD-10-CM | POA: Diagnosis not present

## 2019-06-14 DIAGNOSIS — F32A Depression, unspecified: Secondary | ICD-10-CM

## 2019-06-14 NOTE — Progress Notes (Signed)
Patient:  Sally [name]   DOB: [birth year]  MR Number: [id number]  Location: [facility] [place] [facility] [place], [address] [id number] [place] [id number] Dept: [phone]  Start: 9 AM End: 10 AM  Today's visit was an in person visit that was conducted in my outpatient clinic office.  It lasted for 1 hour.  The patient along with myself were present for this visit.   Provider/Observer:     Edgardo Roys PsyD  Chief Complaint:      Chief Complaint  Patient presents with  . Anxiety  . Stress  . Pain    Reason For Service:     Sally [name] is a 44 year old female referred by [facility] for therapeutic interventions due to chronic pain symptoms.  The patient reports that she has great difficulty standing, walking for long times, elbow pain, migraines, hip pain, shoulder and neck pain.  The patient fell at work and fractured both her elbows and also had knee replacement surgery.  The patient has been taking maintenance doses of opioid pain medications for some time.  The patient reports that she has a great deal of difficulty functioning and has very poor sleep and wakes up numerous times each night.  The patient also has dealt with depression and anxiety for many years and has been hospitalized for depression anxiety years ago.  She has been receiving counseling for anxiety and depression and takes Trintellix.  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  The patient describes chronic issues of depression anxiety but the development of severe chronic pain.  The patient  reports that she is continuing to have some pain in her back post surgery and had a recent fall.  Follow-up MRI did not suggest any structural damage or injury from this fall.  The patient reports that her symptoms associated with complex regional pain syndrome have worsened recently.  Interventions Strategy:  Cognitive/behavioral therapeutic interventions along with coping skills and strategies around chronic pain, sleep disturbance and depression/anxiety.  Participation Level:   Active  Participation Quality:  Appropriate and Attentive      Behavioral Observation:  Well Groomed, Alert, and Appropriate.   Current Psychosocial Factors: Patient with two major stressors one is her friend that is dying from cancer and now has been referred to hematologist due to concerns that she has a blood disorder.     Content of Session:   Reviewed current symptoms and continue to work on therapeutic interventions around issues related to her chronic pain and depression as well as issues related to her chronic complex regional pain syndrome and fibromyalgia symptoms.   Current Status:   The patient reports that she has struggled with continued pain since her surgery.  More recently has been more tired and blood work has developing pattern of blood disorder.  Patient Progress:   The patient reports that her overall sleep has improved from initial status but continues to be problematic.  The patient reports that her vein plays a big role in her difficulty sleeping.  Depression anxiety continue to be quite problematic for the patient.  Impression/Diagnosis:  Sally [name] is a 44 year old female referred by [facility] for therapeutic interventions due to chronic pain symptoms.  The patient reports that she has great difficulty standing, walking for long times, elbow pain, migraines, hip pain, shoulder and neck pain.  The patient fell at work and fractured both her elbows and also had knee replacement  surgery.  The patient has been taking maintenance doses of opioid pain medications for some time.  The patient reports that she has a great deal of difficulty functioning and has very poor sleep and wakes up numerous times each night.  The patient also has dealt with depression and anxiety for many years and has been hospitalized for depression anxiety years ago.  She has been receiving counseling for anxiety and depression and takes Trintellix.  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  The patient describes chronic issues of depression anxiety but the development of severe chronic pain  After a fall at work in 2015 where she fractured both of her elbows, injured her knee and developed fibromyalgia/regional pain syndrome.  The patient reports that she has ongoing difficulties walking distances or standing.  She describes elbow pain, trouble lifting things and trouble writing for any period of time.  The patient describes significant sleep disturbance and is unable to fall asleep at night.  She reports that she is always tired.  The patient describes a normal appetite.  The patient describes memory issues related to trouble remembering things and concentration issues.  The patient reports that she is significantly less active with her children and husband and that her husband has to help more due to her significant pain.  The above impression/diagnoses remain accurate for the current visit.  I reviewed these for this visit and the patient continues to have significant fibromyalgia and chronic pain symptoms.  Diagnosis: Fibromyalgia, anxiety and depression, neck, shoulder and back pain.

## 2019-08-04 ENCOUNTER — Encounter: Payer: No Typology Code available for payment source | Attending: Psychology | Admitting: Psychology

## 2019-08-04 ENCOUNTER — Other Ambulatory Visit: Payer: Self-pay

## 2019-08-04 DIAGNOSIS — G90513 Complex regional pain syndrome I of upper limb, bilateral: Secondary | ICD-10-CM | POA: Diagnosis not present

## 2019-08-04 DIAGNOSIS — F329 Major depressive disorder, single episode, unspecified: Secondary | ICD-10-CM | POA: Insufficient documentation

## 2019-08-04 DIAGNOSIS — G894 Chronic pain syndrome: Secondary | ICD-10-CM | POA: Insufficient documentation

## 2019-08-04 DIAGNOSIS — F419 Anxiety disorder, unspecified: Secondary | ICD-10-CM

## 2019-08-04 DIAGNOSIS — M797 Fibromyalgia: Secondary | ICD-10-CM | POA: Insufficient documentation

## 2019-08-04 DIAGNOSIS — F32A Depression, unspecified: Secondary | ICD-10-CM

## 2019-08-31 ENCOUNTER — Encounter: Payer: Self-pay | Admitting: Psychology

## 2019-08-31 NOTE — Progress Notes (Signed)
Patient:  Sally [name]   DOB: 1975/06/21  MR Number: [id number]  Location: [facility] [name], [address] [id number] Sally [name] Dept: [phone]  Start: 9 AM End: 10 AM  Today's visit was an in person visit that was conducted in my outpatient clinic office.  It lasted for 1 hour.  The patient along with myself were present for this visit.   Provider/Observer:     Edgardo Roys PsyD  Chief Complaint:      Chief Complaint  Patient presents with  . Anxiety  . Stress  . Pain    Reason For Service:     Sally [name] is a 44 year old female referred by [facility] for therapeutic interventions due to chronic pain symptoms.  The patient reports that she has great difficulty standing, walking for long times, elbow pain, migraines, hip pain, shoulder and neck pain.  The patient fell at work and fractured both her elbows and also had knee replacement surgery.  The patient has been taking maintenance doses of opioid pain medications for some time.  The patient reports that she has a great deal of difficulty functioning and has very poor sleep and wakes up numerous times each night.  The patient also has dealt with depression and anxiety for many years and has been hospitalized for depression anxiety years ago.  She has been receiving counseling for anxiety and depression and takes Trintellix.  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  The patient describes chronic issues of depression anxiety but the development of severe chronic pain.  The patient  reports that she is continuing to have some pain in her back post surgery and had a recent fall.  Follow-up MRI did not suggest any structural damage or injury from this fall.  The patient reports that her symptoms associated with complex regional pain syndrome have worsened recently.  Interventions Strategy:  Cognitive/behavioral therapeutic interventions along with coping skills and strategies around chronic pain, sleep disturbance and depression/anxiety.  Participation Level:   Active  Participation Quality:  Appropriate and Attentive      Behavioral Observation:  Well Groomed, Alert, and Appropriate.   Current Psychosocial Factors: The patient reports that there continue to be some stressors but overall the primary stressor has to do with her significant complex regional pain syndrome difficulties.  This places a major limitation on the patient's ability with day-to-day activities and continues to be a major stressor contributing to anxiety and depression type symptomatology.   Content of Session:   Reviewed current symptoms and continue to work on therapeutic interventions around issues related to her chronic pain and depression as well as issues related to her chronic complex regional pain syndrome and fibromyalgia symptoms.   Current Status:   The patient reports that she has struggled with continued pain since her surgery.  More recently has been more tired and blood work has developing pattern of blood disorder.  Patient Progress:   The patient reports that her overall sleep has improved from initial status but continues to be problematic.  The patient reports that her vein plays  a big role in her difficulty sleeping.  Depression anxiety continue to be quite problematic for the patient.  Impression/Diagnosis:   Sally [name] is a 44 year old female referred by [facility] for therapeutic interventions due to chronic pain symptoms.  The patient reports that she has great difficulty  standing, walking for long times, elbow pain, migraines, hip pain, shoulder and neck pain.  The patient fell at work and fractured both her elbows and also had knee replacement surgery.  The patient has been taking maintenance doses of opioid pain medications for some time.  The patient reports that she has a great deal of difficulty functioning and has very poor sleep and wakes up numerous times each night.  The patient also has dealt with depression and anxiety for many years and has been hospitalized for depression anxiety years ago.  She has been receiving counseling for anxiety and depression and takes Trintellix.  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  The patient describes chronic issues of depression anxiety but the development of severe chronic pain  After a fall at work in 2015 where she fractured both of her elbows, injured her knee and developed fibromyalgia/regional pain syndrome.  The patient reports that she has ongoing difficulties walking distances or standing.  She describes elbow pain, trouble lifting things and trouble writing for any period of time.  The patient describes significant sleep disturbance and is unable to fall asleep at night.  She reports that she is always tired.  The patient describes a normal appetite.  The patient describes memory issues related to trouble remembering things and concentration issues.  The patient reports that she is significantly less active with her children and husband and that her husband has to help more due to her significant pain.  The above impression/diagnoses remain accurate for the current visit.  I reviewed these for this visit  and the patient continues to have significant fibromyalgia and chronic pain symptoms.  Diagnosis: Complex regional pain syndrome, fibromyalgia, anxiety and depression, neck, shoulder and back pain.

## 2019-09-01 ENCOUNTER — Other Ambulatory Visit: Payer: Self-pay

## 2019-09-01 ENCOUNTER — Encounter: Payer: No Typology Code available for payment source | Attending: Psychology | Admitting: Psychology

## 2019-09-01 DIAGNOSIS — G894 Chronic pain syndrome: Secondary | ICD-10-CM | POA: Diagnosis present

## 2019-09-01 DIAGNOSIS — F419 Anxiety disorder, unspecified: Secondary | ICD-10-CM

## 2019-09-01 DIAGNOSIS — F331 Major depressive disorder, recurrent, moderate: Secondary | ICD-10-CM

## 2019-09-01 DIAGNOSIS — M797 Fibromyalgia: Secondary | ICD-10-CM | POA: Diagnosis present

## 2019-09-12 ENCOUNTER — Encounter: Payer: Self-pay | Admitting: Psychology

## 2019-09-12 NOTE — Progress Notes (Signed)
Patient:  Sally [name]   DOB: [birth year]  MR Number: [id number]  Location: [facility] [place], [place] [id number] [place] [address] Dept: [phone]  Start: 11 AM End: 12 PM  Today's visit was an in person visit that was conducted in my outpatient clinic office.  It lasted for 1 hour.  The patient along with myself were present for this visit.   Provider/Observer:     Edgardo Roys PsyD  Chief Complaint:      Chief Complaint  Patient presents with  . Anxiety  . Pain  . Stress    Reason For Service:     Sally [name] is a 44 year old female referred by [facility] for therapeutic interventions due to chronic pain symptoms.  The patient reports that she has great difficulty standing, walking for long times, elbow pain, migraines, hip pain, shoulder and neck pain.  The patient fell at work and fractured both her elbows and also had knee replacement surgery.  The patient has been taking maintenance doses of opioid pain medications for some time.  The patient reports that she has a great deal of difficulty functioning and has very poor sleep and wakes up numerous times each night.  The patient also has dealt with depression and anxiety for many years and has been hospitalized for depression anxiety years ago.  She has been receiving counseling for anxiety and depression and takes Trintellix.  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  The patient describes chronic issues of depression anxiety but the development of severe chronic pain.  The patient  reports that she is continuing to have some pain in her back post surgery and had a recent fall.  Follow-up MRI did not suggest any structural damage or injury from this fall.  The patient reports that her symptoms associated with complex regional pain syndrome have worsened recently.  Interventions Strategy:  Cognitive/behavioral therapeutic interventions along with coping skills and strategies around chronic pain, sleep disturbance and depression/anxiety.  Participation Level:   Active  Participation Quality:  Appropriate and Attentive      Behavioral Observation:  Well Groomed, Alert, and Appropriate.   Current Psychosocial Factors: The patient reports that there continue to be some stressors but overall the primary stressor has to do with her significant complex regional pain syndrome difficulties.  This places a major limitation on the patient's ability with day-to-day activities and continues to be a major stressor contributing to anxiety and depression type symptomatology.   Content of Session:   Reviewed current symptoms and continue to work on therapeutic interventions around issues related to her chronic pain and depression as well as issues related to her chronic complex regional pain syndrome and fibromyalgia symptoms.   Current Status:   The patient reports that she has struggled with continued pain since her surgery.  More recently has been more tired and blood work has developing pattern of blood disorder.  Patient Progress:   The patient reports that her overall sleep has improved from initial status but continues to be problematic.  The patient reports that her vein plays  a big role in her difficulty sleeping.  Depression anxiety continue to be quite problematic for the patient.  Impression/Diagnosis:   Sally [name] is a 44 year old female referred by [facility] for therapeutic interventions due to chronic pain symptoms.  The patient reports that she has great difficulty  standing, walking for long times, elbow pain, migraines, hip pain, shoulder and neck pain.  The patient fell at work and fractured both her elbows and also had knee replacement surgery.  The patient has been taking maintenance doses of opioid pain medications for some time.  The patient reports that she has a great deal of difficulty functioning and has very poor sleep and wakes up numerous times each night.  The patient also has dealt with depression and anxiety for many years and has been hospitalized for depression anxiety years ago.  She has been receiving counseling for anxiety and depression and takes Trintellix.  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  The patient describes chronic issues of depression anxiety but the development of severe chronic pain  After a fall at work in 2015 where she fractured both of her elbows, injured her knee and developed fibromyalgia/regional pain syndrome.  The patient reports that she has ongoing difficulties walking distances or standing.  She describes elbow pain, trouble lifting things and trouble writing for any period of time.  The patient describes significant sleep disturbance and is unable to fall asleep at night.  She reports that she is always tired.  The patient describes a normal appetite.  The patient describes memory issues related to trouble remembering things and concentration issues.  The patient reports that she is significantly less active with her children and husband and that her husband has to help more due to her significant pain.  We have continue to work on issues related to her pain and sleep status along with issues of depression  and anxiety.  The above impression/diagnoses remain accurate for the current visit.  I reviewed these for this visit and the patient continues to have significant fibromyalgia and chronic pain symptoms.  Diagnosis: Complex regional pain syndrome, fibromyalgia, anxiety and depression, neck, shoulder and back pain.

## 2019-10-11 ENCOUNTER — Encounter: Payer: No Typology Code available for payment source | Admitting: Psychology

## 2019-11-01 ENCOUNTER — Other Ambulatory Visit: Payer: Self-pay

## 2019-11-01 ENCOUNTER — Encounter: Payer: No Typology Code available for payment source | Attending: Psychology | Admitting: Psychology

## 2019-11-01 DIAGNOSIS — M797 Fibromyalgia: Secondary | ICD-10-CM | POA: Diagnosis not present

## 2019-11-01 DIAGNOSIS — F32A Depression, unspecified: Secondary | ICD-10-CM

## 2019-11-01 DIAGNOSIS — F419 Anxiety disorder, unspecified: Secondary | ICD-10-CM | POA: Diagnosis not present

## 2019-11-01 DIAGNOSIS — F331 Major depressive disorder, recurrent, moderate: Secondary | ICD-10-CM

## 2019-11-01 DIAGNOSIS — G894 Chronic pain syndrome: Secondary | ICD-10-CM

## 2019-11-16 ENCOUNTER — Encounter: Payer: Self-pay | Admitting: Psychology

## 2019-11-16 NOTE — Progress Notes (Signed)
Patient:  Sally [name]   DOB: Jan 18, 1976  MR Number: [id number]  Location: [facility] [place], [address] [id number] [facility] [place] [address] Dept: [phone]  Start: 9 AM End: 10 AM  Today's visit was an in person visit that was conducted in my outpatient clinic office with the patient myself present.  It was 1 hour in duration.   Provider/Observer:     Edgardo Roys PsyD  Chief Complaint:      Chief Complaint  Patient presents with  . Anxiety  . Pain  . Stress    Reason For Service:     Sally [name] is a 44 year old female referred by [facility] for therapeutic interventions due to chronic pain symptoms.  The patient reports that she has great difficulty standing, walking for long times, elbow pain, migraines, hip pain, shoulder and neck pain.  The patient fell at work and fractured both her elbows and also had knee replacement surgery.  The patient has been taking maintenance doses of opioid pain medications for some time.  The patient reports that she has a great deal of difficulty functioning and has very poor sleep and wakes up numerous times each night.  The patient also has dealt with depression and anxiety for many years and has been hospitalized for depression anxiety years ago.  She has been receiving counseling for anxiety and depression and takes Trintellix.  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  The patient describes chronic issues of depression anxiety but the development of severe chronic pain.  The patient reports that she is  continuing to have some pain in her back post surgery and had a recent fall.  Follow-up MRI did not suggest any structural damage or injury from this fall.  The patient reports that her symptoms associated with complex regional pain syndrome have worsened recently.  11/01/2019: The patient is continued to have significant pain and distress with worsening episodes of her complex regional pain syndrome and fibromyalgia type symptoms.  The patient has been very frustrated at times with her lack of ability to function and do daily activities.  Interventions Strategy:  Cognitive/behavioral therapeutic interventions along with coping skills and strategies around chronic pain, sleep disturbance and depression/anxiety.  Participation Level:   Active  Participation Quality:  Appropriate and Attentive      Behavioral Observation:  Well Groomed, Alert, and Appropriate.   Current Psychosocial Factors: The patient continues to be very limited in her overall abilities to engage in day-to-day activities due to her severe chronic pain.  She has had more pain in her shoulder and other chronic pain symptoms.   Content of Session:   Reviewed current symptoms and continue to work on therapeutic interventions around issues related to her chronic pain and depression as well as issues related to her chronic complex regional pain syndrome and fibromyalgia symptoms.   Current Status:   The patient reports that she has struggled with continued pain since her surgery.  More recently has been more tired and blood work has developing pattern of blood disorder.  Patient Progress:   The patient  reports that her overall sleep has improved from initial status but continues to be problematic.  The patient reports that her vein plays a big role in her difficulty sleeping.  Depression anxiety continue to be quite problematic for the patient.  Impression/Diagnosis:   Sally [name] is a 44 year old female referred by [facility] for therapeutic interventions due to chronic pain symptoms.  The patient reports that she has great difficulty standing, walking for long times, elbow pain, migraines, hip pain, shoulder and neck pain.  The patient fell at work and fractured both her elbows and also had knee replacement surgery.  The patient has been taking maintenance doses of opioid pain medications for some time.  The patient reports that she has a great deal of difficulty functioning and has very poor sleep and wakes up numerous times each night.  The patient also has dealt with depression and anxiety for many years and has been hospitalized for depression anxiety years ago.  She has been receiving counseling for anxiety and depression and takes Trintellix.  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  The patient describes chronic issues of depression anxiety but the development of severe chronic pain  After a fall at work in 2015 where she fractured both of her elbows, injured her knee and developed fibromyalgia/regional pain syndrome.  The patient reports that she has ongoing difficulties walking distances or standing.  She describes elbow pain, trouble lifting things and trouble writing for any period of time.  The patient describes significant sleep disturbance and is unable to fall asleep at night.  She reports that she is always tired.  The patient describes a normal appetite.  The patient describes memory issues related to trouble remembering things and concentration issues.  The patient reports that she is significantly less active with her children and husband and that her husband has to help more due to her  significant pain.  We have continue to work on issues related to her pain and sleep status along with issues of depression and anxiety.  The above impression/diagnoses remain accurate for the current visit.  I reviewed these for this visit and the patient continues to have significant complex regional pain type symptoms, fibromyalgia and chronic pain symptoms.  Diagnosis: Complex regional pain syndrome, fibromyalgia, anxiety and depression, neck, shoulder and back pain.

## 2019-12-08 ENCOUNTER — Other Ambulatory Visit: Payer: Self-pay

## 2019-12-08 ENCOUNTER — Encounter: Payer: No Typology Code available for payment source | Admitting: Psychology

## 2020-01-03 ENCOUNTER — Other Ambulatory Visit: Payer: Self-pay

## 2020-01-03 ENCOUNTER — Encounter: Payer: No Typology Code available for payment source | Attending: Psychology | Admitting: Psychology

## 2020-01-03 DIAGNOSIS — G894 Chronic pain syndrome: Secondary | ICD-10-CM

## 2020-01-03 DIAGNOSIS — M5020 Other cervical disc displacement, unspecified cervical region: Secondary | ICD-10-CM | POA: Diagnosis not present

## 2020-01-03 DIAGNOSIS — G90513 Complex regional pain syndrome I of upper limb, bilateral: Secondary | ICD-10-CM | POA: Diagnosis present

## 2020-01-03 DIAGNOSIS — F419 Anxiety disorder, unspecified: Secondary | ICD-10-CM | POA: Diagnosis present

## 2020-01-03 DIAGNOSIS — F331 Major depressive disorder, recurrent, moderate: Secondary | ICD-10-CM

## 2020-01-06 ENCOUNTER — Encounter: Payer: Self-pay | Admitting: Psychology

## 2020-01-06 NOTE — Progress Notes (Signed)
Patient:  Sally [name]   DOB: 1975-12-03  MR Number: [id number]  Location: [facility] [place], [address] [id number] [facility] [place] [address] Dept: [phone]  Start: 9 AM End: 10 AM  Today's visit was an in person visit that was conducted in my outpatient clinic office with the patient myself present.  It was 1 hour in duration.   Provider/Observer:     Edgardo Roys PsyD  Chief Complaint:      Chief Complaint  Patient presents with  . Anxiety  . Pain  . Stress  . Sleeping Problem    Reason For Service:     Sally [name] is a 44 year old female referred by [facility] for therapeutic interventions due to chronic pain symptoms.  The patient reports that she has great difficulty standing, walking for long times, elbow pain, migraines, hip pain, shoulder and neck pain.  The patient fell at work and fractured both her elbows and also had knee replacement surgery.  The patient has been taking maintenance doses of opioid pain medications for some time.  The patient reports that she has a great deal of difficulty functioning and has very poor sleep and wakes up numerous times each night.  The patient also has dealt with depression and anxiety for many years and has been hospitalized for depression anxiety years ago.  She has been receiving counseling for anxiety and depression and takes Trintellix.  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  The patient describes chronic issues of depression anxiety but the development of severe chronic pain.  The patient  reports that she is continuing to have some pain in her back post surgery and had a recent fall.  Follow-up MRI did not suggest any structural damage or injury from this fall.  The patient reports that her symptoms associated with complex regional pain syndrome have worsened recently.  11/01/2019: The patient is continued to have significant pain and distress with worsening episodes of her complex regional pain syndrome and fibromyalgia type symptoms.  The patient has been very frustrated at times with her lack of ability to function and do daily activities.  01/03/2020: The patient reports that she is continued to have significant pain but has particularly had worsening of her fatigue and somnolence.  She reports that she feels like she is sleeping an adequate amount of time but is very tired and fatigued throughout the day.  Interventions Strategy:  Cognitive/behavioral therapeutic interventions along with coping skills and strategies around chronic pain, sleep disturbance and depression/anxiety.  Participation Level:   Active  Participation Quality:  Appropriate and Attentive      Behavioral Observation:  Well Groomed, Alert, and Appropriate.   Current Psychosocial Factors: The patient continues to be very limited in her overall abilities to engage in day-to-day activities due to her severe chronic pain.  She has had more pain in her shoulder and other chronic pain symptoms.   Content of Session:   Reviewed current symptoms and continue to work on therapeutic interventions around issues related to her chronic pain and depression as well as issues related to  her chronic complex regional pain syndrome and fibromyalgia symptoms.   Current Status:   The patient reports that she has struggled with continued pain since her surgery.  More recently has been more tired and blood work has developing pattern of blood disorder.  Patient Progress:   The patient reports that her overall sleep has improved  from initial status but continues to be problematic.  The patient reports that her vein plays a big role in her difficulty sleeping.  Depression anxiety continue to be quite problematic for the patient.  Impression/Diagnosis:   Sally [name] is a 44 year old female referred by [facility] for therapeutic interventions due to chronic pain symptoms.  The patient reports that she has great difficulty standing, walking for long times, elbow pain, migraines, hip pain, shoulder and neck pain.  The patient fell at work and fractured both her elbows and also had knee replacement surgery.  The patient has been taking maintenance doses of opioid pain medications for some time.  The patient reports that she has a great deal of difficulty functioning and has very poor sleep and wakes up numerous times each night.  The patient also has dealt with depression and anxiety for many years and has been hospitalized for depression anxiety years ago.  She has been receiving counseling for anxiety and depression and takes Trintellix.  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  The patient describes chronic issues of depression anxiety but the development of severe chronic pain  After a fall at work in 2015 where she fractured both of her elbows, injured her knee and developed fibromyalgia/regional pain syndrome.  The patient reports that she has ongoing difficulties walking distances or standing.  She describes elbow pain, trouble lifting things and trouble writing for any period of time.  The patient describes significant sleep disturbance and is unable to fall asleep at night.  She reports that she is always  tired.  The patient describes a normal appetite.  The patient describes memory issues related to trouble remembering things and concentration issues.  The patient reports that she is significantly less active with her children and husband and that her husband has to help more due to her significant pain.  We have continue to work on issues related to her pain and sleep status along with issues of depression and anxiety.  The above impression/diagnoses remain accurate for the current visit.  I reviewed these for this visit and the patient continues to have significant complex regional pain type symptoms, fibromyalgia and chronic pain symptoms.  Diagnosis: Complex regional pain syndrome, fibromyalgia, anxiety and depression, neck, shoulder and back pain.

## 2020-02-28 ENCOUNTER — Encounter: Payer: Self-pay | Admitting: Psychology

## 2020-02-28 ENCOUNTER — Other Ambulatory Visit: Payer: Self-pay

## 2020-02-28 ENCOUNTER — Encounter: Payer: No Typology Code available for payment source | Attending: Psychology | Admitting: Psychology

## 2020-02-28 DIAGNOSIS — F32A Depression, unspecified: Secondary | ICD-10-CM | POA: Insufficient documentation

## 2020-02-28 DIAGNOSIS — G894 Chronic pain syndrome: Secondary | ICD-10-CM | POA: Diagnosis not present

## 2020-02-28 DIAGNOSIS — M797 Fibromyalgia: Secondary | ICD-10-CM | POA: Insufficient documentation

## 2020-02-28 DIAGNOSIS — G90513 Complex regional pain syndrome I of upper limb, bilateral: Secondary | ICD-10-CM | POA: Diagnosis not present

## 2020-02-28 DIAGNOSIS — M5020 Other cervical disc displacement, unspecified cervical region: Secondary | ICD-10-CM | POA: Insufficient documentation

## 2020-02-28 DIAGNOSIS — F331 Major depressive disorder, recurrent, moderate: Secondary | ICD-10-CM | POA: Insufficient documentation

## 2020-02-28 DIAGNOSIS — F419 Anxiety disorder, unspecified: Secondary | ICD-10-CM

## 2020-02-28 NOTE — Progress Notes (Signed)
Patient:  Sally [name]   DOB: 04-Nov-1975  MR Number: [id number]  Location: [facility]  PHYSICAL MEDICINE AND REHABILITATION [place], [id number] [place] [id number] Dept: [phone]  Start: 10 AM End: 11 AM  Today's visit was an in person visit was conducted in my outpatient clinic office with the patient myself present.   Provider/Observer:     Edgardo Roys PsyD  Chief Complaint:      Chief Complaint  Patient presents with  . Pain  . Neck Pain  . Stress  . Sleeping Problem  . Anxiety  . Depression    Reason For Service:     Sally [name] is a 45 year old female referred by [facility] for therapeutic interventions due to chronic pain symptoms.  The patient reports that she has great difficulty standing, walking for long times, elbow pain, migraines, hip pain, shoulder and neck pain.  The patient fell at work and fractured both her elbows and also had knee replacement surgery.  The patient has been taking maintenance doses of opioid pain medications for some time.  The patient reports that she has a great deal of difficulty functioning and has very poor sleep and wakes up numerous times each night.  The patient also has dealt with depression and anxiety for many years and has been hospitalized for depression anxiety years ago.  She has been receiving counseling for anxiety and depression and takes Trintellix.  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  The patient describes chronic issues of depression anxiety but the development of severe chronic pain.  The patient  reports that she is continuing to have some pain in her back post surgery and had a recent fall.  Follow-up MRI did not suggest any structural damage or injury from this fall.  The patient reports that her symptoms associated with complex regional pain syndrome have worsened recently.  11/01/2019: The patient is continued to have significant pain and distress with worsening episodes of her complex regional pain syndrome and fibromyalgia type symptoms.  The patient has been very frustrated at times with her lack of ability to function and do daily activities.  01/03/2020: The patient reports that she is continued to have significant pain but has particularly had worsening of her fatigue and somnolence.  She reports that she feels like she is sleeping an adequate amount of time but is very tired and fatigued throughout the day.  02/28/2020: The patient reports that she has had some worsening in her significant pain symptoms with cold and atmospheric changes.  She continues to have significant issues with her upper extremities related to complex regional pain syndrome and fibromyalgia.  The patient also reports that she has had significant psychosocial stressors with her closest friend having terminal cancer and being given 3 months to live.  The patient and her friend are talking about the patient taking over care of the patient's granddaughter and the patient also feels a great deal of need to do what ever she can for her friend going forward.  This is a very difficult and stressful situation for the patient on top of  her numerous medical and pain issues.  The patient reports that her sleep continues to be improved but limited with pain disrupting sleep patterns.  The patient has been followed by Dr. Wess Botts MD [facility]) for some time for pain management and she is now been referred for an evaluation for spinal cord stimulator trialing and possible implantation as well as being scheduled for ketamine  infusions.  They are still working on Designer, television/film set. coverage for the ketamine infusions.  I think that the patient is an excellent candidate for both of these possible options.  The patient and I talked about the appropriateness for ketamine infusions due to her wide spectrum of issues for some time now and I am quite pleased to find out that her treating pain specialist's clinic is now doing ketamine infusions that she appears to be an excellent candidate.  She also appears to be an excellent candidate for spinal cord stimulator trialing and possible implantation.  Interventions Strategy:  Cognitive/behavioral therapeutic interventions along with coping skills and strategies around chronic pain, sleep disturbance and depression/anxiety.  Participation Level:   Active  Participation Quality:  Appropriate and Attentive      Behavioral Observation:  Well Groomed, Alert, and Appropriate.   Current Psychosocial Factors: The patient has continued significant stressors particularly highlighted by her very close friend being diagnosed with terminal cancer that has metastasized to her lungs and other parts of her body and has been given 3 months to live.  This is a major stressor for the patient and the patient and her friend have been talking with the patient taking over the raising of the friends granddaughter.   Content of Session:   Reviewed current symptoms and continue to work on therapeutic interventions around issues related to her chronic pain and depression as well as issues related to her chronic complex regional pain syndrome and fibromyalgia symptoms.   Current Status:   The patient reports that she has struggled with continued pain since her surgery.  More recently has been more tired and blood work has developing pattern of blood disorder.  Patient Progress:   The patient reports that her overall sleep has improved from initial status but continues to be problematic.  The  patient reports that her vein plays a big role in her difficulty sleeping.  Depression anxiety continue to be quite problematic for the patient.  Impression/Diagnosis:   Sally [name] is a 45 year old female referred by [facility] for therapeutic interventions due to chronic pain symptoms.  The patient reports that she has great difficulty standing, walking for long times, elbow pain, migraines, hip pain, shoulder and neck pain.  The patient fell at work and fractured both her elbows and also had knee replacement surgery.  The patient has been taking maintenance doses of opioid pain medications for some time.  The patient reports that she has a great deal of difficulty functioning and has very poor sleep and wakes up numerous times each night.  The patient also has dealt with depression and anxiety for many years and has been hospitalized for depression anxiety years ago.  She has been receiving counseling for anxiety and depression and takes Trintellix.  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  The patient describes chronic issues of depression anxiety but the development of severe chronic pain  After a fall at work in 2015 where she fractured both of her elbows, injured her knee and developed fibromyalgia/regional pain syndrome.  The patient reports that she has ongoing difficulties walking distances or standing.  She describes elbow pain, trouble lifting things and trouble writing for any period of time.  The patient describes significant sleep disturbance and is unable to fall asleep at night.  She reports that she is always tired.  The patient describes a normal appetite.  The patient  describes memory issues related to trouble remembering things and concentration issues.  The patient reports that she is significantly less active with her children and husband and that her husband has to help more due to her significant pain.  We have continue to work on issues related to her pain and sleep status along with issues of depression and anxiety.  02/28/2020: The patient reports that she has had some worsening in her significant pain symptoms with cold and atmospheric changes.  She continues to have significant issues with her upper extremities related to complex regional pain syndrome and fibromyalgia.  The patient also reports that she has had significant psychosocial stressors with her closest friend having terminal cancer and being given 3 months to live.  The patient and her friend are talking about the patient taking over care of the patient's granddaughter and the patient also feels a great deal of need to do what ever she can for her friend going forward.  This is a very difficult and stressful situation for the patient on top of her numerous medical and pain issues.  The patient reports that her sleep continues to be improved but limited with pain disrupting sleep patterns.  The patient has been followed by Dr. Wess Botts MD [facility]) for some time for pain management and she is now been referred for an evaluation for spinal cord stimulator trialing and possible implantation as well as being scheduled for ketamine infusions.  They are still working on Designer, television/film set. coverage for the ketamine infusions.  I think that the patient is an excellent candidate for both of these possible options.  The patient and I talked about the appropriateness for ketamine infusions due to her wide spectrum of issues for some time now and I am quite pleased to find out that her treating pain specialist's clinic is now doing ketamine infusions that she appears to be an excellent candidate.   She also appears to be an excellent candidate for spinal cord stimulator trialing and possible implantation.  Diagnosis: Complex regional pain syndrome, fibromyalgia, anxiety and depression, neck, shoulder and back pain.

## 2020-03-27 ENCOUNTER — Other Ambulatory Visit: Payer: Self-pay

## 2020-03-27 ENCOUNTER — Encounter: Payer: No Typology Code available for payment source | Attending: Psychology | Admitting: Psychology

## 2020-03-27 DIAGNOSIS — G90513 Complex regional pain syndrome I of upper limb, bilateral: Secondary | ICD-10-CM

## 2020-03-27 DIAGNOSIS — M5020 Other cervical disc displacement, unspecified cervical region: Secondary | ICD-10-CM

## 2020-03-27 DIAGNOSIS — F419 Anxiety disorder, unspecified: Secondary | ICD-10-CM

## 2020-03-27 DIAGNOSIS — G894 Chronic pain syndrome: Secondary | ICD-10-CM

## 2020-03-27 DIAGNOSIS — F331 Major depressive disorder, recurrent, moderate: Secondary | ICD-10-CM | POA: Diagnosis not present

## 2020-03-27 DIAGNOSIS — M797 Fibromyalgia: Secondary | ICD-10-CM | POA: Diagnosis present

## 2020-04-01 ENCOUNTER — Encounter: Payer: Self-pay | Admitting: Psychology

## 2020-04-01 NOTE — Progress Notes (Signed)
Patient:  Sally [name]   DOB: 23-Dec-1975  MR Number: [id number]  Location: [facility] [place] PHYSICAL MEDICINE AND REHABILITATION [place], [place] [id number] [place] [address] Dept: [phone]  Start: 11 AM End: 12 PM  Today's visit was an in person visit was conducted in my outpatient clinic office with the patient myself present.   Provider/Observer:     Edgardo Roys PsyD  Chief Complaint:      Chief Complaint  Patient presents with  . Pain  . Neck Pain  . Sleeping Problem  . Anxiety  . Depression    Reason For Service:     Sally [name] is a 45 year old female referred by [facility] for therapeutic interventions due to chronic pain symptoms.  The patient reports that she has great difficulty standing, walking for long times, elbow pain, migraines, hip pain, shoulder and neck pain.  The patient fell at work and fractured both her elbows and also had knee replacement surgery.  The patient has been taking maintenance doses of opioid pain medications for some time.  The patient reports that she has a great deal of difficulty functioning and has very poor sleep and wakes up numerous times each night.  The patient also has dealt with depression and anxiety for many years and has been hospitalized for depression anxiety years ago.  She has been receiving counseling for anxiety and depression and takes Trintellix.  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  The patient describes chronic issues of depression anxiety but the development of severe chronic pain.  The patient reports that she  is continuing to have some pain in her back post surgery and had a recent fall.  Follow-up MRI did not suggest any structural damage or injury from this fall.  The patient reports that her symptoms associated with complex regional pain syndrome have worsened recently.  11/01/2019: The patient is continued to have significant pain and distress with worsening episodes of her complex regional pain syndrome and fibromyalgia type symptoms.  The patient has been very frustrated at times with her lack of ability to function and do daily activities.  01/03/2020: The patient reports that she is continued to have significant pain but has particularly had worsening of her fatigue and somnolence.  She reports that she feels like she is sleeping an adequate amount of time but is very tired and fatigued throughout the day.  02/28/2020: The patient reports that she has had some worsening in her significant pain symptoms with cold and atmospheric changes.  She continues to have significant issues with her upper extremities related to complex regional pain syndrome and fibromyalgia.  The patient also reports that she has had significant psychosocial stressors with her closest friend having terminal cancer and being given 3 months to live.  The patient and her friend are talking about the patient taking over care of the patient's granddaughter and the patient also feels a great deal of need to do what ever she can for her friend going forward.  This is a very difficult and stressful situation for the patient on top of her numerous medical  and pain issues.  The patient reports that her sleep continues to be improved but limited with pain disrupting sleep patterns.  The patient has been followed by Dr. Wess Botts MD [facility]) for some time for pain management and she is now been referred for an evaluation for spinal cord stimulator trialing and possible implantation as well as being scheduled for ketamine infusions.  They  are still working on Designer, television/film set. coverage for the ketamine infusions.  I think that the patient is an excellent candidate for both of these possible options.  The patient and I talked about the appropriateness for ketamine infusions due to her wide spectrum of issues for some time now and I am quite pleased to find out that her treating pain specialist's clinic is now doing ketamine infusions that she appears to be an excellent candidate.  She also appears to be an excellent candidate for spinal cord stimulator trialing and possible implantation.  03/27/2020: The patient reports that she has gone through all of the preliminary work-up to have a spinal cord stimulator trial conducted and is looking forward with hope that it will help with her significant cervical related pain.  The patient is also has an initial appointment to assess for possible ketamine infusion therapies as well.  We reviewed issues related to both the spinal cord stimulator and ketamine infusion with the patient as well as worked on therapeutic interventions around her chronic pain/complex regional pain syndrome diagnoses.  The patient reports that she still has significant psychosocial stressors with her closest friend dying from breast cancer and in the last month or 2 of her life.  At this point, the patient does not look like she will have to take over her friend's granddaughters care as the family is hoping that an uncle of the young girl will take over care.  Interventions Strategy:  Cognitive/behavioral therapeutic interventions along with coping skills and strategies around chronic pain, sleep disturbance and depression/anxiety.  Participation Level:   Active  Participation Quality:  Appropriate and Attentive      Behavioral Observation:  Well Groomed, Alert, and Appropriate.   Current Psychosocial Factors: The patient has continued significant stressors particularly highlighted by her very close friend being  diagnosed with terminal cancer that has metastasized to her lungs and other parts of her body and has been given 3 months to live.  This is a major stressor for the patient and the patient and her friend have been talking with the patient taking over the raising of the friends granddaughter.   Content of Session:   Reviewed current symptoms and continue to work on therapeutic interventions around issues related to her chronic pain and depression as well as issues related to her chronic complex regional pain syndrome and fibromyalgia symptoms.   Current Status:   The patient reports that she has struggled with continued pain since her surgery.  More recently has been more tired and blood work has developing pattern of blood disorder.  Patient Progress:   The patient reports that her overall sleep has improved from initial status but continues to be problematic.  The patient reports that her vein plays a big role in her difficulty sleeping.  Depression anxiety continue to be quite problematic for the patient.  Impression/Diagnosis:   Sally [name] is a 45 year old female referred by [facility] for therapeutic interventions due to chronic pain symptoms.  The patient reports that she has great difficulty standing, walking for long times, elbow pain, migraines,  hip pain, shoulder and neck pain.  The patient fell at work and fractured both her elbows and also had knee replacement surgery.  The patient has been taking maintenance doses of opioid pain medications for some time.  The patient reports that she has a great deal of difficulty functioning and has very poor sleep and wakes up numerous times each night.  The patient also has dealt with depression and anxiety for many years and has been hospitalized for depression anxiety years ago.  She has been receiving counseling for anxiety and depression and takes Trintellix.  The patient reports that she fell in 2015 and injured her right knee and  fractured both of her radial heads in her elbows and developed significant fibromyalgia versus regional pain syndrome.  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  The patient describes chronic issues of depression anxiety but the development of severe chronic pain  After a fall at work in 2015 where she fractured both of her elbows, injured her knee and developed fibromyalgia/regional pain syndrome.  The patient reports that she has ongoing difficulties walking distances or standing.  She describes elbow pain, trouble lifting things and trouble writing for any period of time.  The patient describes significant sleep disturbance and is unable to fall asleep at night.  She reports that she is always tired.  The patient describes a normal appetite.  The patient describes memory issues related to trouble remembering things and concentration issues.  The patient reports that she is significantly less active with her children and husband and that her husband has to help more due to her significant pain.  We have continue to work on issues related to her pain and sleep status along with issues of depression and anxiety.  02/28/2020: The patient reports that she has had some worsening in her significant pain symptoms with cold and atmospheric changes.  She continues to have significant issues with her upper extremities related to complex regional pain syndrome and fibromyalgia.  The patient also reports that she has had significant psychosocial stressors with her closest friend having terminal cancer and being given 3 months to live.  The patient and her friend are talking about the patient taking over care of the patient's granddaughter and the patient also feels a great deal of need to do what ever she can for her friend going forward.  This is a very difficult and stressful situation for the  patient on top of her numerous medical and pain issues.  The patient reports that her sleep continues to be improved but limited with pain disrupting sleep patterns.  The patient has been followed by Dr. Wess Botts MD [facility]) for some time for pain management and she is now been referred for an evaluation for spinal cord stimulator trialing and possible implantation as well as being scheduled for ketamine infusions.  They are still working on Designer, television/film set. coverage for the ketamine infusions.  I think that the patient is an excellent candidate for both of these possible options.  The patient and I talked about the appropriateness for ketamine infusions due to her wide spectrum of issues for some time now and I am quite pleased to find out that her treating pain specialist's clinic is now doing ketamine infusions that she appears to be an excellent candidate.  She also appears to be  an excellent candidate for spinal cord stimulator trialing and possible implantation.  Diagnosis: Complex regional pain syndrome, fibromyalgia, anxiety and depression, neck, shoulder and back pain.

## 2020-04-24 ENCOUNTER — Encounter: Payer: No Typology Code available for payment source | Attending: Psychology | Admitting: Psychology

## 2020-04-24 ENCOUNTER — Other Ambulatory Visit: Payer: Self-pay

## 2020-04-24 DIAGNOSIS — G894 Chronic pain syndrome: Secondary | ICD-10-CM

## 2020-04-24 DIAGNOSIS — M797 Fibromyalgia: Secondary | ICD-10-CM | POA: Diagnosis present

## 2020-04-24 DIAGNOSIS — F419 Anxiety disorder, unspecified: Secondary | ICD-10-CM

## 2020-04-24 DIAGNOSIS — F331 Major depressive disorder, recurrent, moderate: Secondary | ICD-10-CM | POA: Diagnosis present

## 2020-04-24 DIAGNOSIS — G90513 Complex regional pain syndrome I of upper limb, bilateral: Secondary | ICD-10-CM

## 2020-04-24 DIAGNOSIS — M5020 Other cervical disc displacement, unspecified cervical region: Secondary | ICD-10-CM

## 2020-04-29 ENCOUNTER — Encounter: Payer: Self-pay | Admitting: Psychology

## 2020-04-29 NOTE — Progress Notes (Signed)
Patient:  Sally [name]   DOB: 01/25/1975  MR Number: [id number]  Location: [facility] [place], [phone] [place] [id number] Dept: [phone]  Start: 10 AM End: 11 AM  Today's visit was an in person visit was conducted in my outpatient clinic office with the patient myself present.   Provider/Observer:     Edgardo Roys PsyD  Chief Complaint:      Chief Complaint  Patient presents with  . Pain  . Neck Pain  . Depression  . Sleeping Problem    Reason For Service:     Sally [name] is a 45 year old female referred by [facility] for therapeutic interventions due to chronic pain symptoms.  The patient reports that she has great difficulty standing, walking for long times, elbow pain, migraines, hip pain, shoulder and neck pain.  The patient fell at work and fractured both her elbows and also had knee replacement surgery.  The patient has been taking maintenance doses of opioid pain medications for some time.  The patient reports that she has a great deal of difficulty functioning and has very poor sleep and wakes up numerous times each night.  The patient also has dealt with depression and anxiety for many years and has been hospitalized for depression anxiety years ago.  She has been receiving counseling for anxiety and depression and takes Trintellix.  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  The patient describes chronic issues of depression anxiety but the development of severe chronic pain.  The patient reports that she is  continuing to have some pain in her back post surgery and had a recent fall.  Follow-up MRI did not suggest any structural damage or injury from this fall.  The patient reports that her symptoms associated with complex regional pain syndrome have worsened recently.  11/01/2019: The patient is continued to have significant pain and distress with worsening episodes of her complex regional pain syndrome and fibromyalgia type symptoms.  The patient has been very frustrated at times with her lack of ability to function and do daily activities.  01/03/2020: The patient reports that she is continued to have significant pain but has particularly had worsening of her fatigue and somnolence.  She reports that she feels like she is sleeping an adequate amount of time but is very tired and fatigued throughout the day.  02/28/2020: The patient reports that she has had some worsening in her significant pain symptoms with cold and atmospheric changes.  She continues to have significant issues with her upper extremities related to complex regional pain syndrome and fibromyalgia.  The patient also reports that she has had significant psychosocial stressors with her closest friend having terminal cancer and being given 3 months to live.  The patient and her friend are talking about the patient taking over care of the patient's granddaughter and the patient also feels a great deal of need to do what ever she can for her friend going forward.  This is a very difficult and stressful situation for the patient on top of her numerous medical and pain issues.  The patient reports that her sleep continues to be improved but limited with pain disrupting sleep patterns.  The patient has been followed by Dr. Wess Botts MD [facility]) for some time for pain management and she is now been referred for an evaluation for spinal cord stimulator trialing and possible implantation as well as being scheduled for ketamine infusions.  They  are still working on Designer, television/film set. coverage for the ketamine infusions.  I think that the patient is an excellent candidate for both of these possible options.  The patient and I talked about the appropriateness for ketamine infusions due to her wide spectrum of issues for some time now and I am quite pleased to find out that her treating pain specialist's clinic is now doing ketamine infusions that she appears to be an excellent candidate.  She also appears to be an excellent candidate for spinal cord stimulator trialing and possible implantation.  03/27/2020: The patient reports that she has gone through all of the preliminary work-up to have a spinal cord stimulator trial conducted and is looking forward with hope that it will help with her significant cervical related pain.  The patient is also has an initial appointment to assess for possible ketamine infusion therapies as well.  We reviewed issues related to both the spinal cord stimulator and ketamine infusion with the patient as well as worked on therapeutic interventions around her chronic pain/complex regional pain syndrome diagnoses.  The patient reports that she still has significant psychosocial stressors with her closest friend dying from breast cancer and in the last month or 2 of her life.  At this point, the patient does not look like she will have to take over her friend's granddaughters care as the family is hoping that an uncle of the young girl will take over care.  04/24/2020: The patient returns reporting that she has gotten her spinal cord stimulator trialing device and has it implanted currently.  However, there have been mechanical problems with the device and it has been shutting off independently and the patient has not been able to get a good reading on how much it has helped her.  The patient reports that she did experience some brief improvement when she knew that it was on but it would turn off.  She is gone back to  the device maker and they could not figure out the problem.  They are going to put a new external device in and extend the spinal cord stimulator trial in face for 2 days.  The patient has also had her first ketamine infusion trial.  She reports that she was told she was hallucinating and dissociative during this phase and experienced some improvement over the rest of the day as far as her mood and pain symptoms but it was over within 24 hours.  The patient is scheduled to have her next ketamine infusion coming up.  Interventions Strategy:  Cognitive/behavioral therapeutic interventions along with coping skills and strategies around chronic pain, sleep disturbance and depression/anxiety.  Participation Level:   Active  Participation Quality:  Appropriate and Attentive      Behavioral Observation:  Well Groomed, Alert, and Appropriate.   Current Psychosocial Factors: The patient has continued significant stressors particularly highlighted by her very close friend being diagnosed with terminal cancer that has metastasized to her lungs and other parts of her body and has been given 3 months to live.  This is a major stressor for the patient and the patient  and her friend have been talking with the patient taking over the raising of the friends granddaughter.   Content of Session:   Reviewed current symptoms and continue to work on therapeutic interventions around issues related to her chronic pain and depression as well as issues related to her chronic complex regional pain syndrome and fibromyalgia symptoms.   Current Status:   The patient reports that she has struggled with continued pain since her surgery.  More recently has been more tired and blood work has developing pattern of blood disorder.  Patient Progress:   The patient reports that her overall sleep has improved from initial status but continues to be problematic.  The patient reports that her vein plays a big role in her difficulty  sleeping.  Depression anxiety continue to be quite problematic for the patient.  Impression/Diagnosis:   Saphira Lahmann is a 45 year old female referred by [facility] for therapeutic interventions due to chronic pain symptoms.  The patient reports that she has great difficulty standing, walking for long times, elbow pain, migraines, hip pain, shoulder and neck pain.  The patient fell at work and fractured both her elbows and also had knee replacement surgery.  The patient has been taking maintenance doses of opioid pain medications for some time.  The patient reports that she has a great deal of difficulty functioning and has very poor sleep and wakes up numerous times each night.  The patient also has dealt with depression and anxiety for many years and has been hospitalized for depression anxiety years ago.  She has been receiving counseling for anxiety and depression and takes Trintellix.  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  The patient describes chronic issues of depression anxiety but the development of severe chronic pain  After a fall at work in 2015 where she fractured both of her elbows, injured her knee and developed fibromyalgia/regional pain syndrome.  The patient reports that she has ongoing difficulties walking distances or standing.  She describes elbow pain, trouble lifting things and trouble writing for any period of time.  The patient describes significant sleep disturbance and is unable to fall asleep at night.  She reports that she is always tired.  The patient describes a normal appetite.  The patient describes memory issues related to trouble remembering things and  concentration issues.  The patient reports that she is significantly less active with her children and husband and that her husband has to help more due to her significant pain.  We have continue to work on issues related to her pain and sleep status along with issues of depression and anxiety.  02/28/2020: The patient reports that she has had some worsening in her significant pain symptoms with cold and atmospheric changes.  She continues to have significant issues with her upper extremities related to complex regional pain syndrome and fibromyalgia.  The patient also reports that she has had significant psychosocial stressors with her closest friend having terminal cancer and being given 3 months to live.  The patient and her friend are talking about the patient taking over care of the patient's granddaughter and the patient also feels a great deal of need to  do what ever she can for her friend going forward.  This is a very difficult and stressful situation for the patient on top of her numerous medical and pain issues.  The patient reports that her sleep continues to be improved but limited with pain disrupting sleep patterns.  The patient has been followed by Dr. Wess Botts MD [facility]) for some time for pain management and she is now been referred for an evaluation for spinal cord stimulator trialing and possible implantation as well as being scheduled for ketamine infusions.  They are still working on Designer, television/film set. coverage for the ketamine infusions.  I think that the patient is an excellent candidate for both of these possible options.  The patient and I talked about the appropriateness for ketamine infusions due to her wide spectrum of issues for some time now and I am quite pleased to find out that her treating pain specialist's clinic is now doing ketamine infusions that she appears to be an excellent candidate.  She also appears to be an excellent candidate for spinal cord  stimulator trialing and possible implantation.  04/24/2020: The patient returns reporting that she has gotten her spinal cord stimulator trialing device and has it implanted currently.  However, there have been mechanical problems with the device and it has been shutting off independently and the patient has not been able to get a good reading on how much it has helped her.  The patient reports that she did experience some brief improvement when she knew that it was on but it would turn off.  She is gone back to the device maker and they could not figure out the problem.  They are going to put a new external device in and extend the spinal cord stimulator trial in face for 2 days.  The patient has also had her first ketamine infusion trial.  She reports that she was told she was hallucinating and dissociative during this phase and experienced some improvement over the rest of the day as far as her mood and pain symptoms but it was over within 24 hours.  The patient is scheduled to have her next ketamine infusion coming up.   Diagnosis: Complex regional pain syndrome, fibromyalgia, anxiety and depression, neck, shoulder and back pain.

## 2020-05-31 ENCOUNTER — Encounter: Payer: No Typology Code available for payment source | Attending: Psychology | Admitting: Psychology

## 2020-05-31 ENCOUNTER — Other Ambulatory Visit: Payer: Self-pay

## 2020-05-31 DIAGNOSIS — F419 Anxiety disorder, unspecified: Secondary | ICD-10-CM

## 2020-05-31 DIAGNOSIS — M797 Fibromyalgia: Secondary | ICD-10-CM | POA: Diagnosis present

## 2020-05-31 DIAGNOSIS — F331 Major depressive disorder, recurrent, moderate: Secondary | ICD-10-CM | POA: Diagnosis present

## 2020-05-31 DIAGNOSIS — M5020 Other cervical disc displacement, unspecified cervical region: Secondary | ICD-10-CM

## 2020-05-31 DIAGNOSIS — G90513 Complex regional pain syndrome I of upper limb, bilateral: Secondary | ICD-10-CM | POA: Diagnosis present

## 2020-05-31 DIAGNOSIS — G894 Chronic pain syndrome: Secondary | ICD-10-CM | POA: Diagnosis not present

## 2020-06-05 NOTE — Progress Notes (Signed)
Patient:  Sally [name]   DOB: [birth year]  MR Number: [id number]  Location: [facility] [place] [facility] [place], [address] [id number] [place] [id number] Dept: [phone]  Start: 10 AM End: 11 AM  Today's visit was an in person visit was conducted in my outpatient clinic office with the patient myself present.   Provider/Observer:     Edgardo Roys PsyD  Chief Complaint:      Chief Complaint  Patient presents with  . Pain  . Neck Pain  . Depression  . Anxiety  . Stress    Reason For Service:     Sally [name] is a 45 year old female referred by [facility] for therapeutic interventions due to chronic pain symptoms.  The patient reports that she has great difficulty standing, walking for long times, elbow pain, migraines, hip pain, shoulder and neck pain.  The patient fell at work and fractured both her elbows and also had knee replacement surgery.  The patient has been taking maintenance doses of opioid pain medications for some time.  The patient reports that she has a great deal of difficulty functioning and has very poor sleep and wakes up numerous times each night.  The patient also has dealt with depression and anxiety for many years and has been hospitalized for depression anxiety years ago.  She has been receiving counseling for anxiety and depression and takes Trintellix.  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  The patient describes chronic issues of depression anxiety but the development of severe chronic pain.  The patient reports that she is  continuing to have some pain in her back post surgery and had a recent fall.  Follow-up MRI did not suggest any structural damage or injury from this fall.  The patient reports that her symptoms associated with complex regional pain syndrome have worsened recently.  11/01/2019: The patient is continued to have significant pain and distress with worsening episodes of her complex regional pain syndrome and fibromyalgia type symptoms.  The patient has been very frustrated at times with her lack of ability to function and do daily activities.  01/03/2020: The patient reports that she is continued to have significant pain but has particularly had worsening of her fatigue and somnolence.  She reports that she feels like she is sleeping an adequate amount of time but is very tired and fatigued throughout the day.  02/28/2020: The patient reports that she has had some worsening in her significant pain symptoms with cold and atmospheric changes.  She continues to have significant issues with her upper extremities related to complex regional pain syndrome and fibromyalgia.  The patient also reports that she has had significant psychosocial stressors with her closest friend having terminal cancer and being given 3 months to live.  The patient and her friend are talking about the patient taking over care of the patient's granddaughter and the patient also feels a great deal of need to do what ever she can for her friend going forward.  This is a very difficult and stressful situation for the patient on top of her numerous medical and  pain issues.  The patient reports that her sleep continues to be improved but limited with pain disrupting sleep patterns.  The patient has been followed by Dr. Wess Botts MD [facility]) for some time for pain management and she is now been referred for an evaluation for spinal cord stimulator trialing and possible implantation as well as being scheduled for ketamine infusions.  They  are still working on Designer, television/film set. coverage for the ketamine infusions.  I think that the patient is an excellent candidate for both of these possible options.  The patient and I talked about the appropriateness for ketamine infusions due to her wide spectrum of issues for some time now and I am quite pleased to find out that her treating pain specialist's clinic is now doing ketamine infusions that she appears to be an excellent candidate.  She also appears to be an excellent candidate for spinal cord stimulator trialing and possible implantation.  03/27/2020: The patient reports that she has gone through all of the preliminary work-up to have a spinal cord stimulator trial conducted and is looking forward with hope that it will help with her significant cervical related pain.  The patient is also has an initial appointment to assess for possible ketamine infusion therapies as well.  We reviewed issues related to both the spinal cord stimulator and ketamine infusion with the patient as well as worked on therapeutic interventions around her chronic pain/complex regional pain syndrome diagnoses.  The patient reports that she still has significant psychosocial stressors with her closest friend dying from breast cancer and in the last month or 2 of her life.  At this point, the patient does not look like she will have to take over her friend's granddaughters care as the family is hoping that an uncle of the young girl will take over care.  04/24/2020: The patient returns reporting that she has gotten her spinal cord stimulator trialing device and has it implanted currently.  However, there have been mechanical problems with the device and it has been shutting off independently and the patient has not been able to get a good reading on how much it has helped her.  The patient reports that she did experience some brief improvement when she knew that it was on but it would turn off.  She is gone back to  the device maker and they could not figure out the problem.  They are going to put a new external device in and extend the spinal cord stimulator trial in face for 2 days.  The patient has also had her first ketamine infusion trial.  She reports that she was told she was hallucinating and dissociative during this phase and experienced some improvement over the rest of the day as far as her mood and pain symptoms but it was over within 24 hours.  The patient is scheduled to have her next ketamine infusion coming up.  Interventions Strategy:  Cognitive/behavioral therapeutic interventions along with coping skills and strategies around chronic pain, sleep disturbance and depression/anxiety.  Participation Level:   Active  Participation Quality:  Appropriate and Attentive      Behavioral Observation:  Well Groomed, Alert, and Appropriate.   Current Psychosocial Factors: The patient has continued significant stressors particularly highlighted by her very close friend being diagnosed with terminal cancer that has metastasized to her lungs and other parts of her body and has been given 3 months to live.  This is a major stressor for the patient  and the patient and her friend have been talking with the patient taking over the raising of the friends granddaughter.   Content of Session:   Reviewed current symptoms and continue to work on therapeutic interventions around issues related to her chronic pain and depression as well as issues related to her chronic complex regional pain syndrome and fibromyalgia symptoms.   Current Status:   The patient reports that she has struggled with continued pain since her surgery.  More recently has been more tired and blood work has developing pattern of blood disorder.  Patient Progress:   The patient reports that her overall sleep has improved from initial status but continues to be problematic.  The patient reports that her vein plays a big role in her difficulty  sleeping.  Depression anxiety continue to be quite problematic for the patient.  Impression/Diagnosis:   Sally [name] is a 45 year old female referred by [place] for therapeutic interventions due to chronic pain symptoms.  The patient reports that she has great difficulty standing, walking for long times, elbow pain, migraines, hip pain, shoulder and neck pain.  The patient fell at work and fractured both her elbows and also had knee replacement surgery.  The patient has been taking maintenance doses of opioid pain medications for some time.  The patient reports that she has a great deal of difficulty functioning and has very poor sleep and wakes up numerous times each night.  The patient also has dealt with depression and anxiety for many years and has been hospitalized for depression anxiety years ago.  She has been receiving counseling for anxiety and depression and takes Trintellix.  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  The patient describes chronic issues of depression anxiety but the development of severe chronic pain  After a fall at work in 2015 where she fractured both of her elbows, injured her knee and developed fibromyalgia/regional pain syndrome.  The patient reports that she has ongoing difficulties walking distances or standing.  She describes elbow pain, trouble lifting things and trouble writing for any period of time.  The patient describes significant sleep disturbance and is unable to fall asleep at night.  She reports that she is always tired.  The patient describes a normal appetite.  The patient describes memory issues related to trouble remembering things and  concentration issues.  The patient reports that she is significantly less active with her children and husband and that her husband has to help more due to her significant pain.  We have continue to work on issues related to her pain and sleep status along with issues of depression and anxiety.  02/28/2020: The patient reports that she has had some worsening in her significant pain symptoms with cold and atmospheric changes.  She continues to have significant issues with her upper extremities related to complex regional pain syndrome and fibromyalgia.  The patient also reports that she has had significant psychosocial stressors with her closest friend having terminal cancer and being given 3 months to live.  The patient and her friend are talking about the patient taking over care of the patient's granddaughter and the patient also feels a great deal  of need to do what ever she can for her friend going forward.  This is a very difficult and stressful situation for the patient on top of her numerous medical and pain issues.  The patient reports that her sleep continues to be improved but limited with pain disrupting sleep patterns.  The patient has been followed by Dr. Wess Botts MD [facility]) for some time for pain management and she is now been referred for an evaluation for spinal cord stimulator trialing and possible implantation as well as being scheduled for ketamine infusions.  They are still working on Designer, television/film set. coverage for the ketamine infusions.  I think that the patient is an excellent candidate for both of these possible options.  The patient and I talked about the appropriateness for ketamine infusions due to her wide spectrum of issues for some time now and I am quite pleased to find out that her treating pain specialist's clinic is now doing ketamine infusions that she appears to be an excellent candidate.  She also appears to be an excellent candidate for spinal cord  stimulator trialing and possible implantation.  04/24/2020: The patient returns reporting that she has gotten her spinal cord stimulator trialing device and has it implanted currently.  However, there have been mechanical problems with the device and it has been shutting off independently and the patient has not been able to get a good reading on how much it has helped her.  The patient reports that she did experience some brief improvement when she knew that it was on but it would turn off.  She is gone back to the device maker and they could not figure out the problem.  They are going to put a new external device in and extend the spinal cord stimulator trial in face for 2 days.  The patient has also had her first ketamine infusion trial.  She reports that she was told she was hallucinating and dissociative during this phase and experienced some improvement over the rest of the day as far as her mood and pain symptoms but it was over within 24 hours.  The patient is scheduled to have her next ketamine infusion coming up.   Diagnosis: Complex regional pain syndrome, fibromyalgia, anxiety and depression, neck, shoulder and back pain.

## 2020-06-26 ENCOUNTER — Encounter: Payer: No Typology Code available for payment source | Admitting: Psychology

## 2020-08-07 ENCOUNTER — Other Ambulatory Visit: Payer: Self-pay

## 2020-08-07 ENCOUNTER — Encounter: Payer: No Typology Code available for payment source | Attending: Psychology | Admitting: Psychology

## 2020-08-07 ENCOUNTER — Ambulatory Visit (INDEPENDENT_AMBULATORY_CARE_PROVIDER_SITE_OTHER): Payer: Medicare Other | Admitting: Family Medicine

## 2020-08-07 ENCOUNTER — Encounter (INDEPENDENT_AMBULATORY_CARE_PROVIDER_SITE_OTHER): Payer: Self-pay | Admitting: Family Medicine

## 2020-08-07 VITALS — BP 124/78 | HR 76 | Temp 98.0°F | Ht 68.0 in | Wt 273.0 lb

## 2020-08-07 DIAGNOSIS — M797 Fibromyalgia: Secondary | ICD-10-CM | POA: Diagnosis present

## 2020-08-07 DIAGNOSIS — Z1331 Encounter for screening for depression: Secondary | ICD-10-CM

## 2020-08-07 DIAGNOSIS — R0602 Shortness of breath: Secondary | ICD-10-CM

## 2020-08-07 DIAGNOSIS — F419 Anxiety disorder, unspecified: Secondary | ICD-10-CM | POA: Insufficient documentation

## 2020-08-07 DIAGNOSIS — G894 Chronic pain syndrome: Secondary | ICD-10-CM | POA: Diagnosis present

## 2020-08-07 DIAGNOSIS — R748 Abnormal levels of other serum enzymes: Secondary | ICD-10-CM | POA: Diagnosis not present

## 2020-08-07 DIAGNOSIS — F32A Depression, unspecified: Secondary | ICD-10-CM | POA: Insufficient documentation

## 2020-08-07 DIAGNOSIS — Z6841 Body Mass Index (BMI) 40.0 and over, adult: Secondary | ICD-10-CM | POA: Diagnosis not present

## 2020-08-07 DIAGNOSIS — G90513 Complex regional pain syndrome I of upper limb, bilateral: Secondary | ICD-10-CM | POA: Diagnosis present

## 2020-08-07 DIAGNOSIS — E038 Other specified hypothyroidism: Secondary | ICD-10-CM | POA: Diagnosis not present

## 2020-08-07 DIAGNOSIS — F418 Other specified anxiety disorders: Secondary | ICD-10-CM | POA: Diagnosis not present

## 2020-08-07 DIAGNOSIS — R739 Hyperglycemia, unspecified: Secondary | ICD-10-CM

## 2020-08-07 DIAGNOSIS — R5383 Other fatigue: Secondary | ICD-10-CM

## 2020-08-07 DIAGNOSIS — Z0289 Encounter for other administrative examinations: Secondary | ICD-10-CM

## 2020-08-07 NOTE — Progress Notes (Signed)
Patient:  Sally [name]   DOB: 1975-03-24  MR Number: [id number]  Location: [facility]  [facility] [address], [address] [phone] [place] [id number] Dept: [phone]  Start: 8 AM End: 9 AM  Today's visit was an in person visit is conducted in my outpatient clinic office with the patient and myself present in the office.   Provider/Observer:     Edgardo Roys PsyD  Chief Complaint:      Chief Complaint  Patient presents with   Pain   Neck Pain   Depression   Anxiety   Stress    Reason For Service:     Sally [name] is a 45 year old female referred by [facility] for therapeutic interventions due to chronic pain symptoms.  The patient reports that she has great difficulty standing, walking for long times, elbow pain, migraines, hip pain, shoulder and neck pain.  The patient fell at work and fractured both her elbows and also had knee replacement surgery.  The patient has been taking maintenance doses of opioid pain medications for some time.  The patient reports that she has a great deal of difficulty functioning and has very poor sleep and wakes up numerous times each night.  The patient also has dealt with depression and anxiety for many years and has been hospitalized for depression anxiety years ago.  She has been receiving counseling for anxiety and depression and takes Trintellix.   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   The patient describes chronic issues of depression anxiety but the development of severe chronic pain.  The patient reports that she  is continuing to have some pain in her back post surgery and had a recent fall.  Follow-up MRI did not suggest any structural damage or injury from this fall.  The patient reports that her symptoms associated with complex regional pain syndrome have worsened recently.  11/01/2019: The patient is continued to have significant pain and distress with worsening episodes of her complex regional pain syndrome and fibromyalgia type symptoms.  The patient has been very frustrated at times with her lack of ability to function and do daily activities.  01/03/2020: The patient reports that she is continued to have significant pain but has particularly had worsening of her fatigue and somnolence.  She reports that she feels like she is sleeping an adequate amount of time but is very tired and fatigued throughout the day.  02/28/2020: The patient reports that she has had some worsening in her significant pain symptoms with cold and atmospheric changes.  She continues to have significant issues with her upper extremities related to complex regional pain syndrome and fibromyalgia.  The patient also reports that she has had significant psychosocial stressors with her closest friend having terminal cancer and being given 3 months to live.  The patient and her friend are talking about the patient taking over care of the patient's granddaughter and the patient also feels a great deal of need to do what ever she can for her friend going forward.  This is a very difficult and stressful situation for the patient on  top of her numerous medical and pain issues.  The patient reports that her sleep continues to be improved but limited with pain disrupting sleep patterns.  The patient has been followed by Dr. Wess Botts MD [facility]) for some time for pain management and she is now been referred for an evaluation for spinal cord stimulator trialing and possible implantation as well as being scheduled for ketamine infusions.  They  are still working on Designer, television/film set. coverage for the ketamine infusions.  I think that the patient is an excellent candidate for both of these possible options.  The patient and I talked about the appropriateness for ketamine infusions due to her wide spectrum of issues for some time now and I am quite pleased to find out that her treating pain specialist's clinic is now doing ketamine infusions that she appears to be an excellent candidate.  She also appears to be an excellent candidate for spinal cord stimulator trialing and possible implantation.  03/27/2020: The patient reports that she has gone through all of the preliminary work-up to have a spinal cord stimulator trial conducted and is looking forward with hope that it will help with her significant cervical related pain.  The patient is also has an initial appointment to assess for possible ketamine infusion therapies as well.  We reviewed issues related to both the spinal cord stimulator and ketamine infusion with the patient as well as worked on therapeutic interventions around her chronic pain/complex regional pain syndrome diagnoses.  The patient reports that she still has significant psychosocial stressors with her closest friend dying from breast cancer and in the last month or 2 of her life.  At this point, the patient does not look like she will have to take over her friend's granddaughters care as the family is hoping that an uncle of the young girl will take over care.  04/24/2020: The patient returns reporting that she has gotten her spinal cord stimulator trialing device and has it implanted currently.  However, there have been mechanical problems with the device and it has been shutting off independently and the patient has not been able to get a good reading on how much it has helped her.  The patient reports that she did experience some brief improvement when she knew that it was on but it would turn off.  She is gone back to  the device maker and they could not figure out the problem.  They are going to put a new external device in and extend the spinal cord stimulator trial in face for 2 days.  The patient has also had her first ketamine infusion trial.  She reports that she was told she was hallucinating and dissociative during this phase and experienced some improvement over the rest of the day as far as her mood and pain symptoms but it was over within 24 hours.  The patient is scheduled to have her next ketamine infusion coming up.  08/07/2020: The patient reports that they are looking at doing a new spinal cord stimulator trial as the device did not work properly during her 7-day trial.  In fact, they attempted to extended to 10 days hoping to get it working correctly.  However, the patient reports that she felt like it did help the first day when it was apparently working but shortly stopped working and it sounds like there were never able to get it working properly.  The patient reports that at this point, her anesthesiologist/pain specialist  does not think that she should have any more ketamine trials but the patient would like to try them again.  The patient reports that the doctor felt that she had an adverse reaction to it but her husband was present and did not describe any severe reaction.  At this point, she is planning to talk to him again about possibly having another ketamine infusion if possible.  The patient reports that she continues with her significant chronic pain symptoms but she is managing day-to-day.  She reports that there continues to be a major stressor in her life relative to a very close friend who has terminal cancer and the cancer is continuing to progress.  Interventions Strategy:  Cognitive/behavioral therapeutic interventions along with coping skills and strategies around chronic pain, sleep disturbance and depression/anxiety.  Participation Level:   Active  Participation Quality:  Appropriate  and Attentive      Behavioral Observation:  Well Groomed, Alert, and Appropriate.   Current Psychosocial Factors: The patient reports that she has had a lot of psychosocial stressors around her friend and how other people are handling it.  The patient reports that she continues with her significant pain which straightly reduces her functioning.   Content of Session:   Reviewed current symptoms and continue to work on therapeutic interventions around issues related to her chronic pain and depression as well as issues related to her chronic complex regional pain syndrome and fibromyalgia symptoms.   Current Status:   The patient is hoping that they are able to do another spinal cord stimulator trial as she felt like it was helpful the first day before the device stopped working.  Patient Progress:   The patient reports that her overall sleep has improved from initial status but continues to be problematic.  The patient reports that her vein plays a big role in her difficulty sleeping.  Depression anxiety continue to be quite problematic for the patient.  Impression/Diagnosis:   Sumiko Ceasar is a 45 year old female referred by [facility] for therapeutic interventions due to chronic pain symptoms.  The patient reports that she has great difficulty standing, walking for long times, elbow pain, migraines, hip pain, shoulder and neck pain.  The patient fell at work and fractured both her elbows and also had knee replacement surgery.  The patient has been taking maintenance doses of opioid pain medications for some time.  The patient reports that she has a great deal of difficulty functioning and has very poor sleep and wakes up numerous times each night.  The patient also has dealt with depression and anxiety for many years and has been hospitalized for depression anxiety years ago.  She has been receiving counseling for anxiety and depression and takes Trintellix.   The patient reports that she  fell in 2015 and injured her right knee and fractured both of her radial heads in her elbows and developed significant fibromyalgia versus regional pain syndrome.  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   The patient describes chronic issues of depression anxiety but the development of severe chronic pain  After a fall at work in 2015 where she fractured both of her elbows, injured her knee and developed fibromyalgia/regional pain syndrome.  The patient reports that she has ongoing difficulties walking distances or standing.  She describes elbow pain, trouble lifting things and trouble writing for any  period of time.  The patient describes significant sleep disturbance and is unable to fall asleep at night.  She reports that she is always tired.  The patient describes a normal appetite.  The patient describes memory issues related to trouble remembering things and concentration issues.  The patient reports that she is significantly less active with her children and husband and that her husband has to help more due to her significant pain.  We have continue to work on issues related to her pain and sleep status along with issues of depression and anxiety.  02/28/2020: The patient reports that she has had some worsening in her significant pain symptoms with cold and atmospheric changes.  She continues to have significant issues with her upper extremities related to complex regional pain syndrome and fibromyalgia.  The patient also reports that she has had significant psychosocial stressors with her closest friend having terminal cancer and being given 3 months to live.  The patient and her friend are talking about the patient taking over care of the patient's granddaughter and the patient also feels a great deal of need to do what ever she can for her friend going forward.  This is a very  difficult and stressful situation for the patient on top of her numerous medical and pain issues.  The patient reports that her sleep continues to be improved but limited with pain disrupting sleep patterns.  The patient has been followed by Dr. Wess Botts MD [facility]) for some time for pain management and she is now been referred for an evaluation for spinal cord stimulator trialing and possible implantation as well as being scheduled for ketamine infusions.  They are still working on Designer, television/film set. coverage for the ketamine infusions.  I think that the patient is an excellent candidate for both of these possible options.  The patient and I talked about the appropriateness for ketamine infusions due to her wide spectrum of issues for some time now and I am quite pleased to find out that her treating pain specialist's clinic is now doing ketamine infusions that she appears to be an excellent candidate.  She also appears to be an excellent candidate for spinal cord stimulator trialing and possible implantation.  04/24/2020: The patient returns reporting that she has gotten her spinal cord stimulator trialing device and has it implanted currently.  However, there have been mechanical problems with the device and it has been shutting off independently and the patient has not been able to get a good reading on how much it has helped her.  The patient reports that she did experience some brief improvement when she knew that it was on but it would turn off.  She is gone back to the device maker and they could not figure out the problem.  They are going to put a new external device in and extend the spinal cord stimulator trial in face for 2 days.  The patient has also had her first ketamine infusion trial.  She reports that she was told she was hallucinating and dissociative during this phase and experienced some improvement over the rest of the day as far as her mood and pain symptoms but it was  over within 24 hours.  The patient is scheduled to have her next ketamine infusion coming up.  08/07/2020: The patient reports that they are looking at doing a new spinal cord stimulator trial as the device did not work properly during her 7-day trial.  In fact,  they attempted to extended to 10 days hoping to get it working correctly.  However, the patient reports that she felt like it did help the first day when it was apparently working but shortly stopped working and it sounds like there were never able to get it working properly.  The patient reports that at this point, her anesthesiologist/pain specialist does not think that she should have any more ketamine trials but the patient would like to try them again.  The patient reports that the doctor felt that she had an adverse reaction to it but her husband was present and did not describe any severe reaction.  At this point, she is planning to talk to him again about possibly having another ketamine infusion if possible.  The patient reports that she continues with her significant chronic pain symptoms but she is managing day-to-day.  She reports that there continues to be a major stressor in her life relative to a very close friend who has terminal cancer and the cancer is continuing to progress.   Diagnosis: Complex regional pain syndrome, fibromyalgia, anxiety and depression, neck, shoulder and back pain.

## 2020-08-08 ENCOUNTER — Encounter: Payer: No Typology Code available for payment source | Admitting: Psychology

## 2020-08-08 LAB — COMPREHENSIVE METABOLIC PANEL
ALT: 16 IU/L (ref 0–32)
AST: 16 IU/L (ref 0–40)
Albumin/Globulin Ratio: 1.6 (ref 1.2–2.2)
Albumin: 4.1 g/dL (ref 3.8–4.8)
Alkaline Phosphatase: 101 IU/L (ref 44–121)
BUN/Creatinine Ratio: 15 (ref 9–23)
BUN: 12 mg/dL (ref 6–24)
Bilirubin Total: 0.4 mg/dL (ref 0.0–1.2)
CO2: 19 mmol/L — ABNORMAL LOW (ref 20–29)
Calcium: 9.3 mg/dL (ref 8.7–10.2)
Chloride: 109 mmol/L — ABNORMAL HIGH (ref 96–106)
Creatinine, Ser: 0.82 mg/dL (ref 0.57–1.00)
Globulin, Total: 2.6 g/dL (ref 1.5–4.5)
Glucose: 84 mg/dL (ref 65–99)
Potassium: 4.5 mmol/L (ref 3.5–5.2)
Sodium: 141 mmol/L (ref 134–144)
Total Protein: 6.7 g/dL (ref 6.0–8.5)
eGFR: 89 mL/min/{1.73_m2} (ref >59)

## 2020-08-08 LAB — VITAMIN B12: Vitamin B-12: 836 pg/mL (ref 232–1245)

## 2020-08-08 LAB — HEMOGLOBIN A1C
Est. average glucose Bld gHb Est-mCnc: 105 mg/dL
Hgb A1c MFr Bld: 5.3 % (ref 4.8–5.6)

## 2020-08-08 LAB — INSULIN, RANDOM: INSULIN: 4.9 u[IU]/mL (ref 2.6–24.9)

## 2020-08-15 NOTE — Progress Notes (Signed)
Chief Complaint:   OBESITY Sally [name] (MR# [id number]) is a 45 y.o. female who presents for evaluation and treatment of obesity and related comorbidities. Current BMI is Body mass index is 41.51 kg/m. Sally [name] has been struggling with her weight for many years and has been unsuccessful in either losing weight, maintaining weight loss, or reaching her healthy weight goal.  Sally [name] had lap band surgery in 2008, and went from 325 lbs to 245 lbs in approximately 1 year. She started gaining after approximately 10 years when fluid was removed from her band due to increased vomiting. She now has 5.5 oc in her band in the last month. She has been on phentermine for 2 months and has lost weight, but she gets side effects.  Sally [name] is currently in the action stage of change and ready to dedicate time achieving and maintaining a healthier weight. Sally [name] is interested in becoming our patient and working on intensive lifestyle modifications including (but not limited to) diet and exercise for weight loss.  Sally [name]'s habits were reviewed today and are as follows: Her family eats meals together, she thinks her family will eat healthier with her, her desired weight loss is 93 lbs, she has been heavy most of her life, she started gaining weight 8 months ago, her heaviest weight ever was 325 pounds, she is a picky eater and doesn't like to eat healthier foods, she has significant food cravings issues, she snacks frequently in the evenings, she skips meals frequently, she is frequently drinking liquids with calories, she frequently makes poor food choices, she has problems with excessive hunger, and she struggles with emotional eating.  Depression Screen Sally [name]'s Food and Mood (modified PHQ-9) score was 20.  Depression screen PHQ 2/9 08/07/2020  Decreased Interest 2  Down, Depressed, Hopeless 3  PHQ - 2 Score 5  Altered sleeping 1  Tired, decreased energy 3  Change in appetite 2  Feeling bad or failure  about yourself  3  Trouble concentrating 1  Moving slowly or fidgety/restless 2  Suicidal thoughts 3  PHQ-9 Score 20  Difficult doing work/chores Very difficult  Some recent data might be hidden   Subjective:   1. Other fatigue Sally [name] admits to daytime somnolence and admits to waking up still tired. Patent has a history of symptoms of daytime fatigue and morning headache. Sally [name] generally gets 6 or 7 hours of sleep per night, and states that she has nightime awakenings. Snoring is present. Apneic episodes are present. Epworth Sleepiness Score is 13.  2. SOB (shortness of breath) on exertion Sally [name] notes increasing shortness of breath with exercising and seems to be worsening over time with weight gain. She notes getting out of breath sooner with activity than she used to. This has not gotten worse recently. Sally [name] denies shortness of breath at rest or orthopnea.  3. Elevated vitamin B12 level Sally [name] is not on B12, but her last lab showed her level at approximately 1000.  4. Other specified hypothyroidism Sally [name] is followed by her primary care provider, who is testing her labs and prescribing her medications.  5. Hyperglycemia Sally [name] has had some elevated glucose readings in the past along with some low glucose levels. She notes some polyphagia at times as well.  6. Depression with anxiety Sally [name] has chronic pain and this contributes to her mood. She notes some emotional eating behaviors, but she feels this isn't her biggest issue.  Assessment/Plan:   1. Other fatigue Sally [name] does feel that her weight  is causing her energy to be lower than it should be. Fatigue may be related to obesity, depression or many other causes. Labs will be ordered, and in the meanwhile, Sally [name] will focus on self care including making healthy food choices, increasing physical activity and focusing on stress reduction.  - EKG 12-Lead  2. SOB (shortness of breath) on exertion Sally [name] does feel that she gets out of breath  more easily that she used to when she exercises. Sally [name]'s shortness of breath appears to be obesity related and exercise induced. She has agreed to work on weight loss and gradually increase exercise to treat her exercise induced shortness of breath. Will continue to monitor closely.  3. Elevated vitamin B12 level The diagnosis was reviewed with the patient. We will check labs today. Sally [name] will follow up in 2 weeks as directed. Orders and follow up as documented in patient record.  - Vitamin B12  4. Other specified hypothyroidism Sally [name] will continue to follow up with her primary care provider to keep her at goal. Orders and follow up as documented in patient record.  5. Hyperglycemia We will check labs today, and results with be discussed with Sally [name] in 2 weeks at her follow up visit. In the meanwhile Sally [name] was started on a lower simple carbohydrate diet and will work on weight loss efforts.  - Insulin, random - Comprehensive metabolic panel - Hemoglobin A1c  6. Depression with anxiety Behavior modification techniques were discussed today to help Sally [name] deal with her depression and anxiety. We will refer Sally [name] to Dr. Mallie Mussel, our Bariatric Psychologist for evaluation. Orders and follow up as documented in patient record.   7. Obesity with current BMI 41.5 Sally [name] is currently in the action stage of change and her goal is to continue with weight loss efforts. I recommend Sally [name] begin the structured treatment plan as follows:  She has agreed to the Category 2 Plan + 100 calories.  We discussed various medication options to help Sally [name] with her weight loss efforts and we both agreed to discontinue phentermine for now.  Exercise goals: No exercise has been prescribed for now, while we concentrate on nutritional changes.  Behavioral modification strategies: increasing lean protein intake and meal planning and cooking strategies.  She was informed of the importance of frequent follow-up visits to  maximize her success with intensive lifestyle modifications for her multiple health conditions. She was informed we would discuss her lab results at her next visit unless there is a critical issue that needs to be addressed sooner. Sally [name] agreed to keep her next visit at the agreed upon time to discuss these results.  Objective:   Blood pressure 124/78, pulse 76, temperature 98 F (36.7 C), height '5\' 8"'$  (1.727 m), weight 273 lb (123.8 kg), last menstrual period 10/26/2015, SpO2 96 %. Body mass index is 41.51 kg/m.  EKG: Normal sinus rhythm, rate 79 BPM.  Indirect Calorimeter completed today shows a VO2 of 339 and a REE of 2359.  Her calculated basal metabolic rate is 0000000 thus her basal metabolic rate is better than expected.  General: Cooperative, alert, well developed, in no acute distress. HEENT: Conjunctivae and lids unremarkable. Cardiovascular: Regular rhythm.  Lungs: Normal work of breathing. Neurologic: No focal deficits.   Lab Results  Component Value Date   CREATININE 0.82 08/07/2020   BUN 12 08/07/2020   NA 141 08/07/2020   K 4.5 08/07/2020   CL 109 (H) 08/07/2020   CO2 19 (L) 08/07/2020   Lab Results  Component  Value Date   ALT 16 08/07/2020   AST 16 08/07/2020   ALKPHOS 101 08/07/2020   BILITOT 0.4 08/07/2020   Lab Results  Component Value Date   HGBA1C 5.3 08/07/2020   HGBA1C 5.3 04/03/2015   Lab Results  Component Value Date   INSULIN 4.9 08/07/2020   Lab Results  Component Value Date   TSH 0.05 (L) 08/12/2016   Lab Results  Component Value Date   CHOL 140 09/03/2016   HDL 28.40 (L) 09/03/2016   LDLCALC 93 09/03/2016   TRIG 94.0 09/03/2016   CHOLHDL 5 09/03/2016   Lab Results  Component Value Date   WBC 8.9 10/20/2018   HGB 15.1 (H) 10/20/2018   HCT 44.5 10/20/2018   MCV 98.0 10/20/2018   PLT 194 10/20/2018   Lab Results  Component Value Date   FERRITIN 67 07/27/2014   Obesity Behavioral Intervention:   Approximately 15 minutes were  spent on the discussion below.  ASK: We discussed the diagnosis of obesity with Han today and Marguita agreed to give [place] permission to discuss obesity behavioral modification therapy today.  ASSESS: Sally [name] has the diagnosis of obesity and her BMI today is 41.5. Dazani is in the action stage of change.   ADVISE: Sally [name] was educated on the multiple health risks of obesity as well as the benefit of weight loss to improve her health. She was advised of the need for long term treatment and the importance of lifestyle modifications to improve her current health and to decrease her risk of future health problems.  AGREE: Multiple dietary modification options and treatment options were discussed and Eneida agreed to follow the recommendations documented in the above note.  ARRANGE: Sally [name] was educated on the importance of frequent visits to treat obesity as outlined per CMS and USPSTF guidelines and agreed to schedule her next follow up appointment today.  Attestation Statements:   Reviewed by clinician on day of visit: allergies, medications, problem list, medical history, surgical history, family history, social history, and previous encounter notes.   I, Trixie Dredge, am acting as transcriptionist for [name], MD.  I have reviewed the above documentation for accuracy and completeness, and I agree with the above. - [name], MD

## 2020-08-21 ENCOUNTER — Encounter (INDEPENDENT_AMBULATORY_CARE_PROVIDER_SITE_OTHER): Payer: Self-pay | Admitting: Family Medicine

## 2020-08-21 ENCOUNTER — Other Ambulatory Visit: Payer: Self-pay

## 2020-08-21 ENCOUNTER — Ambulatory Visit (INDEPENDENT_AMBULATORY_CARE_PROVIDER_SITE_OTHER): Payer: Medicare Other | Admitting: Family Medicine

## 2020-08-21 VITALS — BP 119/81 | HR 75 | Temp 98.4°F | Ht 68.0 in | Wt 274.0 lb

## 2020-08-21 DIAGNOSIS — F418 Other specified anxiety disorders: Secondary | ICD-10-CM | POA: Diagnosis not present

## 2020-08-21 DIAGNOSIS — Z6841 Body Mass Index (BMI) 40.0 and over, adult: Secondary | ICD-10-CM

## 2020-08-21 MED ORDER — SAXENDA 18 MG/3ML ~~LOC~~ SOPN: 3.0000 mg | PEN_INJECTOR | Freq: Every day | SUBCUTANEOUS | 0 refills | Status: DC

## 2020-08-21 MED ORDER — BD PEN NEEDLE NANO U/F 32G X 4 MM MISC: 0 refills | Status: DC

## 2020-08-23 ENCOUNTER — Encounter (INDEPENDENT_AMBULATORY_CARE_PROVIDER_SITE_OTHER): Payer: Self-pay

## 2020-08-23 NOTE — Telephone Encounter (Signed)
Please review

## 2020-08-23 NOTE — Progress Notes (Signed)
Chief Complaint:   OBESITY Sally [name] is here to discuss her progress with her obesity treatment plan along with follow-up of her obesity related diagnoses. Sally [name] is on the Category 2 Plan + 100 calories and states she is following her eating plan approximately 20% of the time. Sally [name] states she is walking for 10 minutes.   Today's visit was #: 2 Starting weight: 273 lbs Starting date: 08/07/2020 Today's weight: 274 lbs Today's date: 08/21/2020 Total lbs lost to date: 0 Total lbs lost since last in-office visit: 0  Interim History: Sally [name] struggled to follow her meal plan closely, partially due to traveling to the beach recently. Her depression has worsened and she hasn't been in the active stage of change. Her Psychiarist adjust her medications and she is hoping she will be feeling better soon.  Subjective:   1. Depression with anxiety Sally [name] is followed by her Psychiatrist, and her mood has been worse and this has affected her motivation to follow her plan. She is trying to at least portion control and make smarter choices.   Assessment/Plan:   1. Depression with anxiety Behavior modification techniques were discussed today to help Sally [name] deal with her emotional/non-hunger eating behaviors. We will continue to help Sally [name] avoid self-sabotage and get back on track with healthier eating. Orders and follow up as documented in patient record.   2. Obesity with current BMI 41.8 Sally [name] is currently in the action stage of change. As such, her goal is to continue with weight loss efforts. She has agreed to the Category 2 Plan + 100 calories.   We discussed various medication options to help Sally [name] with her weight loss efforts and we both agreed to start Saxenda 3 mg daily with no refills (patient is to start at 0.6 mg), and pen needles #100 with no refills.  - Liraglutide -Weight Management (SAXENDA) 18 MG/3ML SOPN; Inject 3 mg into the skin daily. Start at 0.6 daily  Dispense: 15 mL; Refill: 0 -  Insulin Pen Needle (BD PEN NEEDLE NANO U/F) 32G X 4 MM MISC; Use Nano needle with Saxenda  Dispense: 100 each; Refill: 0  Exercise goals: As is.  Behavioral modification strategies: increasing lean protein intake.  Sally [name] has agreed to follow-up with our clinic in 2 weeks. She was informed of the importance of frequent follow-up visits to maximize her success with intensive lifestyle modifications for her multiple health conditions.   Objective:   Blood pressure 119/81, pulse 75, temperature 98.4 F (36.9 C), height '5\' 8"'$  (1.727 m), weight 274 lb (124.3 kg), last menstrual period 10/26/2015, SpO2 97 %. Body mass index is 41.66 kg/m.  General: Cooperative, alert, well developed, in no acute distress. HEENT: Conjunctivae and lids unremarkable. Cardiovascular: Regular rhythm.  Lungs: Normal work of breathing. Neurologic: No focal deficits.   Lab Results  Component Value Date   CREATININE 0.82 08/07/2020   BUN 12 08/07/2020   NA 141 08/07/2020   K 4.5 08/07/2020   CL 109 (H) 08/07/2020   CO2 19 (L) 08/07/2020   Lab Results  Component Value Date   ALT 16 08/07/2020   AST 16 08/07/2020   ALKPHOS 101 08/07/2020   BILITOT 0.4 08/07/2020   Lab Results  Component Value Date   HGBA1C 5.3 08/07/2020   HGBA1C 5.3 04/03/2015   Lab Results  Component Value Date   INSULIN 4.9 08/07/2020   Lab Results  Component Value Date   TSH 0.05 (L) 08/12/2016   Lab Results  Component Value  Date   CHOL 140 09/03/2016   HDL 28.40 (L) 09/03/2016   LDLCALC 93 09/03/2016   TRIG 94.0 09/03/2016   CHOLHDL 5 09/03/2016   Lab Results  Component Value Date   VD25OH 34 01/08/2016   VD25OH 21 (L) 10/19/2015   VD25OH 24 (L) 06/28/2015   Lab Results  Component Value Date   WBC 8.9 10/20/2018   HGB 15.1 (H) 10/20/2018   HCT 44.5 10/20/2018   MCV 98.0 10/20/2018   PLT 194 10/20/2018   Lab Results  Component Value Date   FERRITIN 67 07/27/2014   Attestation Statements:   Reviewed by  clinician on day of visit: allergies, medications, problem list, medical history, surgical history, family history, social history, and previous encounter notes.  Time spent on visit including pre-visit chart review and post-visit care and charting was 42 minutes.    I, Trixie Dredge, am acting as transcriptionist for [name], MD.  I have reviewed the above documentation for accuracy and completeness, and I agree with the above. -  [name], MD

## 2020-09-03 ENCOUNTER — Encounter (INDEPENDENT_AMBULATORY_CARE_PROVIDER_SITE_OTHER): Payer: Self-pay | Admitting: Family Medicine

## 2020-09-03 ENCOUNTER — Other Ambulatory Visit: Payer: Self-pay

## 2020-09-03 ENCOUNTER — Ambulatory Visit (INDEPENDENT_AMBULATORY_CARE_PROVIDER_SITE_OTHER): Payer: Medicare Other | Admitting: Family Medicine

## 2020-09-03 VITALS — BP 129/82 | HR 76 | Temp 97.9°F | Ht 68.0 in | Wt 272.0 lb

## 2020-09-03 DIAGNOSIS — F32A Depression, unspecified: Secondary | ICD-10-CM

## 2020-09-03 DIAGNOSIS — Z9189 Other specified personal risk factors, not elsewhere classified: Secondary | ICD-10-CM

## 2020-09-03 DIAGNOSIS — Z6841 Body Mass Index (BMI) 40.0 and over, adult: Secondary | ICD-10-CM

## 2020-09-03 NOTE — Progress Notes (Deleted)
Risk

## 2020-09-04 NOTE — Progress Notes (Signed)
Chief Complaint:   OBESITY Sally [name] is here to discuss her progress with her obesity treatment plan along with follow-up of her obesity related diagnoses. Sally [name] is on the Category 2 Plan + 100 calories and states she is following her eating plan approximately 40% of the time. Sally [name] states she is doing 0 minutes 0 times per week.  Today's visit was #: 3 Starting weight: 273 lbs Starting date: 08/07/2020 Today's weight: 272 lbs Today's date: 09/03/2020 Total lbs lost to date: 1 Total lbs lost since last in-office visit: 2  Interim History: Sally [name] is doing well with weight loss. She started Saxenda at 0.6 mg q AM and she had nausea, so she changed to PM and this improved. She is struggling to eat all of the food on her plan.  Subjective:   1. Depression, unspecified depression type Sally [name] is on Wellbutrin and Prozac. Her mood is stable and she appears to be in control of her emotional eating behaviors.  Assessment/Plan:   1. Depression, unspecified depression type Behavior modification techniques were discussed today to help Sally [name] deal with her emotional/non-hunger eating behaviors. Sally [name] will continue her medications as is, and will continue to follow up as directed. Orders and follow up as documented in patient record.   2. Obesity with current BMI 41.4 Sally [name] is currently in the action stage of change. As such, her goal is to continue with weight loss efforts. She has agreed to change to keeping a food journal and adhering to recommended goals of 1300-1600 calories and 85+ grams of protein daily.   We discussed various medication options to help Sally [name] with her weight loss efforts and we both agreed to continue Saxenda at 0.6 mg q AM (change to AM again next week) now that she is doing better.  Behavioral modification strategies: increasing lean protein intake, meal planning and cooking strategies, and keeping a strict food journal.  Sally [name] has agreed to follow-up with our clinic in 2  to 3 weeks. She was informed of the importance of frequent follow-up visits to maximize her success with intensive lifestyle modifications for her multiple health conditions.   Objective:   Blood pressure 129/82, pulse 76, temperature 97.9 F (36.6 C), height '5\' 8"'$  (1.727 m), weight 272 lb (123.4 kg), last menstrual period 10/26/2015, SpO2 97 %. Body mass index is 41.36 kg/m.  General: Cooperative, alert, well developed, in no acute distress. HEENT: Conjunctivae and lids unremarkable. Cardiovascular: Regular rhythm.  Lungs: Normal work of breathing. Neurologic: No focal deficits.   Lab Results  Component Value Date   CREATININE 0.82 08/07/2020   BUN 12 08/07/2020   NA 141 08/07/2020   K 4.5 08/07/2020   CL 109 (H) 08/07/2020   CO2 19 (L) 08/07/2020   Lab Results  Component Value Date   ALT 16 08/07/2020   AST 16 08/07/2020   ALKPHOS 101 08/07/2020   BILITOT 0.4 08/07/2020   Lab Results  Component Value Date   HGBA1C 5.3 08/07/2020   HGBA1C 5.3 04/03/2015   Lab Results  Component Value Date   INSULIN 4.9 08/07/2020   Lab Results  Component Value Date   TSH 0.05 (L) 08/12/2016   Lab Results  Component Value Date   CHOL 140 09/03/2016   HDL 28.40 (L) 09/03/2016   LDLCALC 93 09/03/2016   TRIG 94.0 09/03/2016   CHOLHDL 5 09/03/2016   Lab Results  Component Value Date   VD25OH 34 01/08/2016   VD25OH 21 (L) 10/19/2015   VD25OH  24 (L) 06/28/2015   Lab Results  Component Value Date   WBC 8.9 10/20/2018   HGB 15.1 (H) 10/20/2018   HCT 44.5 10/20/2018   MCV 98.0 10/20/2018   PLT 194 10/20/2018   Lab Results  Component Value Date   FERRITIN 67 07/27/2014   Attestation Statements:   Reviewed by clinician on day of visit: allergies, medications, problem list, medical history, surgical history, family history, social history, and previous encounter notes.  Time spent on visit including pre-visit chart review and post-visit care and charting was 33 minutes.     I, Trixie Dredge, am acting as transcriptionist for [name], MD.  I have reviewed the above documentation for accuracy and completeness, and I agree with the above. -  [name], MD

## 2020-09-11 ENCOUNTER — Other Ambulatory Visit: Payer: Self-pay

## 2020-09-11 ENCOUNTER — Encounter: Payer: No Typology Code available for payment source | Attending: Psychology | Admitting: Psychology

## 2020-09-11 DIAGNOSIS — M5020 Other cervical disc displacement, unspecified cervical region: Secondary | ICD-10-CM

## 2020-09-11 DIAGNOSIS — G894 Chronic pain syndrome: Secondary | ICD-10-CM | POA: Insufficient documentation

## 2020-09-11 DIAGNOSIS — F32A Depression, unspecified: Secondary | ICD-10-CM | POA: Insufficient documentation

## 2020-09-11 DIAGNOSIS — G90513 Complex regional pain syndrome I of upper limb, bilateral: Secondary | ICD-10-CM

## 2020-09-11 DIAGNOSIS — M797 Fibromyalgia: Secondary | ICD-10-CM | POA: Diagnosis present

## 2020-09-11 DIAGNOSIS — F419 Anxiety disorder, unspecified: Secondary | ICD-10-CM | POA: Diagnosis not present

## 2020-09-17 ENCOUNTER — Ambulatory Visit (INDEPENDENT_AMBULATORY_CARE_PROVIDER_SITE_OTHER): Payer: Medicare Other | Admitting: Family Medicine

## 2020-09-18 ENCOUNTER — Other Ambulatory Visit (INDEPENDENT_AMBULATORY_CARE_PROVIDER_SITE_OTHER): Payer: Self-pay | Admitting: Family Medicine

## 2020-09-18 NOTE — Telephone Encounter (Signed)
Dr.Beasley 

## 2020-09-19 ENCOUNTER — Ambulatory Visit (INDEPENDENT_AMBULATORY_CARE_PROVIDER_SITE_OTHER): Payer: Medicare Other | Admitting: Family Medicine

## 2020-09-19 ENCOUNTER — Other Ambulatory Visit: Payer: Self-pay

## 2020-09-19 ENCOUNTER — Encounter (INDEPENDENT_AMBULATORY_CARE_PROVIDER_SITE_OTHER): Payer: Self-pay | Admitting: Family Medicine

## 2020-09-19 VITALS — BP 113/77 | HR 78 | Temp 98.0°F | Ht 68.0 in | Wt 270.0 lb

## 2020-09-19 DIAGNOSIS — Z6841 Body Mass Index (BMI) 40.0 and over, adult: Secondary | ICD-10-CM

## 2020-09-19 DIAGNOSIS — E559 Vitamin D deficiency, unspecified: Secondary | ICD-10-CM

## 2020-09-19 MED ORDER — VITAMIN D (ERGOCALCIFEROL) 1.25 MG (50000 UNIT) PO CAPS: ORAL_CAPSULE | ORAL | 0 refills | Status: DC

## 2020-09-19 MED ORDER — VITAMIN D (ERGOCALCIFEROL) 1.25 MG (50000 UNIT) PO CAPS: ORAL_CAPSULE | ORAL | 4 refills | Status: DC

## 2020-09-19 NOTE — Progress Notes (Signed)
Chief Complaint:   OBESITY Sally [name] is here to discuss her progress with her obesity treatment plan along with follow-up of her obesity related diagnoses. Sally [name] is on keeping a food journal and adhering to recommended goals of 1300-1600 calories and 85+ grams of protein daily and states she is following her eating plan approximately 75% of the time. Sally [name] states she is walking for 20 minutes 2 times per week.  Today's visit was #: 4 Starting weight: 273 lbs Starting date: 08/07/2020 Today's weight: 270 lbs Today's date: 09/19/2020 Total lbs lost to date: 3 Total lbs lost since last in-office visit: 2  Interim History: Sally [name] is working on diet and exercise. She is doing well on Saxenda but she hasn't noticed a decrease in her appetite yet. She struggles to meet her protein goals.  Subjective:   1. Vitamin D deficiency Sally [name] is stable on Vit D, and she denies nausea, vomiting, or muscle weakness.  Assessment/Plan:   1. Vitamin D deficiency Low Vitamin D level contributes to fatigue and are associated with obesity, breast, and colon cancer. We will refill prescription Vitamin D for 1 month. Sally [name] will follow-up for routine testing of Vitamin D, at least 2-3 times per year to avoid over-replacement.  - Vitamin D, Ergocalciferol, (DRISDOL) 1.25 MG (50000 UNIT) CAPS capsule; TAKE ONE CAPSULE EVERY 7 DAYS  Dispense: 4 capsule; Refill: 0  2. Obesity with current BMI 41.1 Sally [name] is currently in the action stage of change. As such, her goal is to continue with weight loss efforts. She has agreed to keeping a food journal and adhering to recommended goals of 1300-1600 calories and 85+ grams of protein daily.   We discussed various medication options to help Sally [name] with her weight loss efforts and we both agreed to increase Saxenda to 1.2 mg daily.  Exercise goals: As is.  Behavioral modification strategies: increasing lean protein intake, meal planning and cooking strategies, and holiday  eating strategies .  Sally [name] has agreed to follow-up with our clinic in 3 weeks. She was informed of the importance of frequent follow-up visits to maximize her success with intensive lifestyle modifications for her multiple health conditions.   Objective:   Blood pressure 113/77, pulse 78, temperature 98 F (36.7 C), height '5\' 8"'$  (1.727 m), weight 270 lb (122.5 kg), last menstrual period 10/26/2015, SpO2 97 %. Body mass index is 41.05 kg/m.  General: Cooperative, alert, well developed, in no acute distress. HEENT: Conjunctivae and lids unremarkable. Cardiovascular: Regular rhythm.  Lungs: Normal work of breathing. Neurologic: No focal deficits.   Lab Results  Component Value Date   CREATININE 0.82 08/07/2020   BUN 12 08/07/2020   NA 141 08/07/2020   K 4.5 08/07/2020   CL 109 (H) 08/07/2020   CO2 19 (L) 08/07/2020   Lab Results  Component Value Date   ALT 16 08/07/2020   AST 16 08/07/2020   ALKPHOS 101 08/07/2020   BILITOT 0.4 08/07/2020   Lab Results  Component Value Date   HGBA1C 5.3 08/07/2020   HGBA1C 5.3 04/03/2015   Lab Results  Component Value Date   INSULIN 4.9 08/07/2020   Lab Results  Component Value Date   TSH 0.05 (L) 08/12/2016   Lab Results  Component Value Date   CHOL 140 09/03/2016   HDL 28.40 (L) 09/03/2016   LDLCALC 93 09/03/2016   TRIG 94.0 09/03/2016   CHOLHDL 5 09/03/2016   Lab Results  Component Value Date   VD25OH 34 01/08/2016  VD25OH 21 (L) 10/19/2015   VD25OH 24 (L) 06/28/2015   Lab Results  Component Value Date   WBC 8.9 10/20/2018   HGB 15.1 (H) 10/20/2018   HCT 44.5 10/20/2018   MCV 98.0 10/20/2018   PLT 194 10/20/2018   Lab Results  Component Value Date   FERRITIN 67 07/27/2014    Obesity Behavioral Intervention:   Approximately 15 minutes were spent on the discussion below.  ASK: We discussed the diagnosis of obesity with Sokhna today and Passion agreed to give [place] permission to discuss obesity behavioral  modification therapy today.  ASSESS: Arreon has the diagnosis of obesity and her BMI today is 41.1. Ernesto is in the action stage of change.   ADVISE: [place] was educated on the multiple health risks of obesity as well as the benefit of weight loss to improve her health. She was advised of the need for long term treatment and the importance of lifestyle modifications to improve her current health and to decrease her risk of future health problems.  AGREE: Multiple dietary modification options and treatment options were discussed and Lonnisha agreed to follow the recommendations documented in the above note.  ARRANGE: Obdulia was educated on the importance of frequent visits to treat obesity as outlined per CMS and USPSTF guidelines and agreed to schedule her next follow up appointment today.  Attestation Statements:   Reviewed by clinician on day of visit: allergies, medications, problem list, medical history, surgical history, family history, social history, and previous encounter notes.   I, Trixie Dredge, am acting as transcriptionist for [name], MD.  I have reviewed the above documentation for accuracy and completeness, and I agree with the above. -  [name], MD

## 2020-09-20 ENCOUNTER — Ambulatory Visit (INDEPENDENT_AMBULATORY_CARE_PROVIDER_SITE_OTHER): Payer: Medicare Other | Admitting: Family Medicine

## 2020-10-10 ENCOUNTER — Ambulatory Visit (INDEPENDENT_AMBULATORY_CARE_PROVIDER_SITE_OTHER): Payer: Medicare Other | Admitting: Family Medicine

## 2020-10-10 ENCOUNTER — Other Ambulatory Visit: Payer: Self-pay

## 2020-10-10 ENCOUNTER — Encounter (INDEPENDENT_AMBULATORY_CARE_PROVIDER_SITE_OTHER): Payer: Self-pay | Admitting: Family Medicine

## 2020-10-10 VITALS — BP 108/69 | HR 76 | Temp 97.8°F | Ht 68.0 in | Wt 269.0 lb

## 2020-10-10 DIAGNOSIS — Z6841 Body Mass Index (BMI) 40.0 and over, adult: Secondary | ICD-10-CM | POA: Diagnosis not present

## 2020-10-10 DIAGNOSIS — F3289 Other specified depressive episodes: Secondary | ICD-10-CM

## 2020-10-11 ENCOUNTER — Encounter: Payer: No Typology Code available for payment source | Admitting: Psychology

## 2020-10-11 NOTE — Progress Notes (Signed)
Chief Complaint:   OBESITY Sally [name] is here to discuss her progress with her obesity treatment plan along with follow-up of her obesity related diagnoses. Sally [name] is on keeping a food journal and adhering to recommended goals of 1300-1600 calories and 85+ grams of protein daily and states she is following her eating plan approximately 70% of the time. Sally [name] states she is being more active.   Today's visit was #: 5 Starting weight: 273 lbs Starting date: 08/07/2020 Today's weight: 268 lbs Today's date: 10/10/2020 Total lbs lost to date: 5 Total lbs lost since last in-office visit: 2  Interim History: [name] continues to do well with weight loss on Saxenda. She note it has decreased her hunger signals and she is tolerating it well. She is working on increasing her activity.  Subjective:   1. Other depression with emotional eating Sally [name] has been on Topamax in the past but she hasn't been on it recently. She notes more "neurologic" hunger versus physiologic hunger and this could help her.  Assessment/Plan:   1. Other depression with emotional eating Behavior modification techniques were discussed today to help Sally [name] deal with her emotional/non-hunger eating behaviors. Sally [name] agreed to restart Topama 50 mg, and we will follow up in 2 weeks. Orders and follow up as documented in patient record.   2. Obesity with current BMI 40.8 Sally [name] is currently in the action stage of change. As such, her goal is to continue with weight loss efforts. She has agreed to keeping a food journal and adhering to recommended goals of 1300-1600 calories and 85+ grams of protein daily.   We discussed various medication options to help Sally [name] with her weight loss efforts and we both agreed to continue Saxenda at 1.2 mg, and will consider increasing at her next visit.  Behavioral modification strategies: decreasing simple carbohydrates and no skipping meals.  Sally [name] has agreed to follow-up with our clinic in 2 weeks.  She was informed of the importance of frequent follow-up visits to maximize her success with intensive lifestyle modifications for her multiple health conditions.   Objective:   Blood pressure 108/69, pulse 76, temperature 97.8 F (36.6 C), height 5\' 8"  (1.727 m), weight 269 lb (122 kg), last menstrual period 10/26/2015, SpO2 96 %. Body mass index is 40.9 kg/m.  General: Cooperative, alert, well developed, in no acute distress. HEENT: Conjunctivae and lids unremarkable. Cardiovascular: Regular rhythm.  Lungs: Normal work of breathing. Neurologic: No focal deficits.   Lab Results  Component Value Date   CREATININE 0.82 08/07/2020   BUN 12 08/07/2020   NA 141 08/07/2020   K 4.5 08/07/2020   CL 109 (H) 08/07/2020   CO2 19 (L) 08/07/2020   Lab Results  Component Value Date   ALT 16 08/07/2020   AST 16 08/07/2020   ALKPHOS 101 08/07/2020   BILITOT 0.4 08/07/2020   Lab Results  Component Value Date   HGBA1C 5.3 08/07/2020   HGBA1C 5.3 04/03/2015   Lab Results  Component Value Date   INSULIN 4.9 08/07/2020   Lab Results  Component Value Date   TSH 0.05 (L) 08/12/2016   Lab Results  Component Value Date   CHOL 140 09/03/2016   HDL 28.40 (L) 09/03/2016   LDLCALC 93 09/03/2016   TRIG 94.0 09/03/2016   CHOLHDL 5 09/03/2016   Lab Results  Component Value Date   VD25OH 34 01/08/2016   VD25OH 21 (L) 10/19/2015   VD25OH 24 (L) 06/28/2015   Lab Results  Component Value  Date   WBC 8.9 10/20/2018   HGB 15.1 (H) 10/20/2018   HCT 44.5 10/20/2018   MCV 98.0 10/20/2018   PLT 194 10/20/2018   Lab Results  Component Value Date   FERRITIN 67 07/27/2014    Obesity Behavioral Intervention:   Approximately 15 minutes were spent on the discussion below.  ASK: We discussed the diagnosis of obesity with Anayi today and Francena agreed to give [place] permission to discuss obesity behavioral modification therapy today.  ASSESS: Sally [name] has the diagnosis of obesity and her BMI  today is 40.8. Sally [name] is in the action stage of change.   ADVISE: Sally [name] was educated on the multiple health risks of obesity as well as the benefit of weight loss to improve her health. She was advised of the need for long term treatment and the importance of lifestyle modifications to improve her current health and to decrease her risk of future health problems.  AGREE: Multiple dietary modification options and treatment options were discussed and Sally [name] agreed to follow the recommendations documented in the above note.  ARRANGE: Sally [name] was educated on the importance of frequent visits to treat obesity as outlined per CMS and USPSTF guidelines and agreed to schedule her next follow up appointment today.  Attestation Statements:   Reviewed by clinician on day of visit: allergies, medications, problem list, medical history, surgical history, family history, social history, and previous encounter notes.   I, Trixie Dredge, am acting as transcriptionist for Sally Nip, MD.  I have reviewed the above documentation for accuracy and completeness, and I agree with the above. -  Sally Nip, MD

## 2020-10-14 ENCOUNTER — Encounter: Payer: Self-pay | Admitting: Psychology

## 2020-10-14 NOTE — Progress Notes (Signed)
Patient:  Sally [name]   DOB: [birth year]  MR Number: [id number]  Location: [facility] [place] [facility] [address], [id number] [place] [address] Dept: [phone]  Start: 11 AM End: 12 PM  Today's visit was an in person visit is conducted in my outpatient clinic office with the patient and myself present in the office.   Provider/Observer:     Edgardo Roys PsyD  Chief Complaint:      Chief Complaint  Patient presents with   Anxiety   Depression   Pain   Neck Pain    Reason For Service:     Sally [name] is a 45 year old female referred by [facility] for therapeutic interventions due to chronic pain symptoms.  The patient reports that she has great difficulty standing, walking for long times, elbow pain, migraines, hip pain, shoulder and neck pain.  The patient fell at work and fractured both her elbows and also had knee replacement surgery.  The patient has been taking maintenance doses of opioid pain medications for some time.  The patient reports that she has a great deal of difficulty functioning and has very poor sleep and wakes up numerous times each night.  The patient also has dealt with depression and anxiety for many years and has been hospitalized for depression anxiety years ago.  She has been receiving counseling for anxiety and depression and takes Trintellix.   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   The patient describes chronic issues of depression anxiety but the development of severe chronic pain.  The patient reports that she is  continuing to have some pain in her back post surgery and had a recent fall.  Follow-up MRI did not suggest any structural damage or injury from this fall.  The patient reports that her symptoms associated with complex regional pain syndrome have worsened recently.  11/01/2019: The patient is continued to have significant pain and distress with worsening episodes of her complex regional pain syndrome and fibromyalgia type symptoms.  The patient has been very frustrated at times with her lack of ability to function and do daily activities.  01/03/2020: The patient reports that she is continued to have significant pain but has particularly had worsening of her fatigue and somnolence.  She reports that she feels like she is sleeping an adequate amount of time but is very tired and fatigued throughout the day.  02/28/2020: The patient reports that she has had some worsening in her significant pain symptoms with cold and atmospheric changes.  She continues to have significant issues with her upper extremities related to complex regional pain syndrome and fibromyalgia.  The patient also reports that she has had significant psychosocial stressors with her closest friend having terminal cancer and being given 3 months to live.  The patient and her friend are talking about the patient taking over care of the patient's granddaughter and the patient also feels a great deal of need to do what ever she can for her friend going forward.  This is a very difficult and stressful situation for the patient on top of her  numerous medical and pain issues.  The patient reports that her sleep continues to be improved but limited with pain disrupting sleep patterns.  The patient has been followed by Dr. Wess Botts MD [facility] for some time for pain management and she is now been referred for an evaluation for spinal cord stimulator trialing and possible implantation as well as being scheduled for ketamine infusions.  They  are still working on Designer, television/film set. coverage for the ketamine infusions.  I think that the patient is an excellent candidate for both of these possible options.  The patient and I talked about the appropriateness for ketamine infusions due to her wide spectrum of issues for some time now and I am quite pleased to find out that her treating pain specialist's clinic is now doing ketamine infusions that she appears to be an excellent candidate.  She also appears to be an excellent candidate for spinal cord stimulator trialing and possible implantation.  03/27/2020: The patient reports that she has gone through all of the preliminary work-up to have a spinal cord stimulator trial conducted and is looking forward with hope that it will help with her significant cervical related pain.  The patient is also has an initial appointment to assess for possible ketamine infusion therapies as well.  We reviewed issues related to both the spinal cord stimulator and ketamine infusion with the patient as well as worked on therapeutic interventions around her chronic pain/complex regional pain syndrome diagnoses.  The patient reports that she still has significant psychosocial stressors with her closest friend dying from breast cancer and in the last month or 2 of her life.  At this point, the patient does not look like she will have to take over her friend's granddaughters care as the family is hoping that an uncle of the young girl will take over care.  04/24/2020: The patient returns reporting that she has gotten her spinal cord stimulator trialing device and has it implanted currently.  However, there have been mechanical problems with the device and it has been shutting off independently and the patient has not been able to get a good reading on how much it has helped her.  The patient reports that she did experience some brief improvement when she knew that it was on but it would turn off.  She is gone back to  the device maker and they could not figure out the problem.  They are going to put a new external device in and extend the spinal cord stimulator trial in face for 2 days.  The patient has also had her first ketamine infusion trial.  She reports that she was told she was hallucinating and dissociative during this phase and experienced some improvement over the rest of the day as far as her mood and pain symptoms but it was over within 24 hours.  The patient is scheduled to have her next ketamine infusion coming up.  08/07/2020: The patient reports that they are looking at doing a new spinal cord stimulator trial as the device did not work properly during her 7-day trial.  In fact, they attempted to extended to 10 days hoping to get it working correctly.  However, the patient reports that she felt like it did help the first day when it was apparently working but shortly stopped working and it sounds like there were never able to get it working properly.  The patient reports that at this point, her anesthesiologist/pain specialist does not think  that she should have any more ketamine trials but the patient would like to try them again.  The patient reports that the doctor felt that she had an adverse reaction to it but her husband was present and did not describe any severe reaction.  At this point, she is planning to talk to him again about possibly having another ketamine infusion if possible.  The patient reports that she continues with her significant chronic pain symptoms but she is managing day-to-day.  She reports that there continues to be a major stressor in her life relative to a very close friend who has terminal cancer and the cancer is continuing to progress.  09/11/2020: The patient reports that her friend is now being managed by hospice and appears to be at the end of her life.  This has been stressful but the patient has been able to adjust to the impending reality with time and she feels like she is  handling it fairly well.  The patient reports that she continues to have significant pain in her arms radiating from her neck.  The patient reports that at this point she has not been able to return for ketamine therapies and not discussed strategies to follow-up with [facility].  Interventions Strategy:  Cognitive/behavioral therapeutic interventions along with coping skills and strategies around chronic pain, sleep disturbance and depression/anxiety.  Participation Level:   Active  Participation Quality:  Appropriate and Attentive      Behavioral Observation:  Well Groomed, Alert, and Appropriate.   Current Psychosocial Factors: The patient reports that she has had a lot of psychosocial stressors around her friend and how other people are handling it.  The patient reports that she continues with her significant pain which straightly reduces her functioning.   Content of Session:   Reviewed current symptoms and continue to work on therapeutic interventions around issues related to her chronic pain and depression as well as issues related to her chronic complex regional pain syndrome and fibromyalgia symptoms.   Current Status:   The patient is hoping that they are able to do another spinal cord stimulator trial as she felt like it was helpful the first day before the device stopped working.  Patient Progress:   The patient reports that her overall sleep has improved from initial status but continues to be problematic.  The patient reports that her vein plays a big role in her difficulty sleeping.  Depression anxiety continue to be quite problematic for the patient.  Impression/Diagnosis:   Sally [name] is a 45 year old female referred by [facility] for therapeutic interventions due to chronic pain symptoms.  The patient reports that she has great difficulty standing, walking for long times, elbow pain, migraines, hip pain, shoulder and neck pain.  The patient fell at work  and fractured both her elbows and also had knee replacement surgery.  The patient has been taking maintenance doses of opioid pain medications for some time.  The patient reports that she has a great deal of difficulty functioning and has very poor sleep and wakes up numerous times each night.  The patient also has dealt with depression and anxiety for many years and has been hospitalized for depression anxiety years ago.  She has been receiving counseling for anxiety and depression and takes Trintellix.   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   The patient describes chronic issues of depression anxiety but the development of severe chronic pain  After a fall at work in 2015 where she fractured both of her elbows, injured her knee and developed fibromyalgia/regional pain syndrome.  The patient reports that she has ongoing difficulties walking distances or standing.  She describes elbow pain, trouble lifting things and trouble writing for any period of time.  The patient describes significant sleep disturbance and is unable to fall asleep at night.  She reports that she is always tired.  The patient describes a normal appetite.  The patient describes memory issues related to trouble remembering things and concentration issues.  The patient reports that she is significantly less active with her children and husband and that her husband has to help more due to her significant pain.  We have continue to work on issues related to her pain and sleep status along with issues of depression and anxiety.  02/28/2020: The patient reports that she has had some worsening in her significant pain symptoms with  cold and atmospheric changes.  She continues to have significant issues with her upper extremities related to complex regional pain syndrome and fibromyalgia.  The patient also reports that she has had significant psychosocial stressors with her closest friend having terminal cancer and being given 3 months to live.  The patient and her friend are talking about the patient taking over care of the patient's granddaughter and the patient also feels a great deal of need to do what ever she can for her friend going forward.  This is a very difficult and stressful situation for the patient on top of her numerous medical and pain issues.  The patient reports that her sleep continues to be improved but limited with pain disrupting sleep patterns.  The patient has been followed by Dr. Wess Botts MD [facility]) for some time for pain management and she is now been referred for an evaluation for spinal cord stimulator trialing and possible implantation as well as being scheduled for ketamine infusions.  They are still working on Designer, television/film set. coverage for the ketamine infusions.  I think that the patient is an excellent candidate for both of these possible options.  The patient and I talked about the appropriateness for ketamine infusions due to her wide spectrum of issues for some time now and I am quite pleased to find out that her treating pain specialist's clinic is now doing ketamine infusions that she appears to be an excellent candidate.  She also appears to be an excellent candidate for spinal cord stimulator trialing and possible implantation.  04/24/2020: The patient returns reporting that she has gotten her spinal cord stimulator trialing device and has it implanted currently.  However, there have been mechanical problems with the device and it has been shutting off independently and the patient has not been able to get a good reading on how much it has helped her.  The patient reports that  she did experience some brief improvement when she knew that it was on but it would turn off.  She is gone back to the device maker and they could not figure out the problem.  They are going to put a new external device in and extend the spinal cord stimulator trial in face for 2  days.  The patient has also had her first ketamine infusion trial.  She reports that she was told she was hallucinating and dissociative during this phase and experienced some improvement over the rest of the day as far as her mood and pain symptoms but it was over within 24 hours.  The patient is scheduled to have her next ketamine infusion coming up.  08/07/2020: The patient reports that they are looking at doing a new spinal cord stimulator trial as the device did not work properly during her 7-day trial.  In fact, they attempted to extended to 10 days hoping to get it working correctly.  However, the patient reports that she felt like it did help the first day when it was apparently working but shortly stopped working and it sounds like there were never able to get it working properly.  The patient reports that at this point, her anesthesiologist/pain specialist does not think that she should have any more ketamine trials but the patient would like to try them again.  The patient reports that the doctor felt that she had an adverse reaction to it but her husband was present and did not describe any severe reaction.  At this point, she is planning to talk to him again about possibly having another ketamine infusion if possible.  The patient reports that she continues with her significant chronic pain symptoms but she is managing day-to-day.  She reports that there continues to be a major stressor in her life relative to a very close friend who has terminal cancer and the cancer is continuing to progress.  09/11/2020: The patient reports that her friend is now being managed by hospice and appears to be at the end of her life.  This has  been stressful but the patient has been able to adjust to the impending reality with time and she feels like she is handling it fairly well.  The patient reports that she continues to have significant pain in her arms radiating from her neck.  The patient reports that at this point she has not been able to return for ketamine therapies and not discussed strategies to follow-up with [facility].   Diagnosis: Complex regional pain syndrome, fibromyalgia, anxiety and depression, neck, shoulder and back pain.

## 2020-10-25 ENCOUNTER — Other Ambulatory Visit: Payer: Self-pay

## 2020-10-25 ENCOUNTER — Encounter (INDEPENDENT_AMBULATORY_CARE_PROVIDER_SITE_OTHER): Payer: Self-pay | Admitting: Family Medicine

## 2020-10-25 ENCOUNTER — Ambulatory Visit (INDEPENDENT_AMBULATORY_CARE_PROVIDER_SITE_OTHER): Payer: Medicare Other | Admitting: Family Medicine

## 2020-10-25 VITALS — BP 131/84 | HR 73 | Temp 97.8°F | Ht 68.0 in | Wt 269.0 lb

## 2020-10-25 DIAGNOSIS — Z6841 Body Mass Index (BMI) 40.0 and over, adult: Secondary | ICD-10-CM

## 2020-10-25 DIAGNOSIS — F4321 Adjustment disorder with depressed mood: Secondary | ICD-10-CM

## 2020-10-25 DIAGNOSIS — F3289 Other specified depressive episodes: Secondary | ICD-10-CM

## 2020-10-25 MED ORDER — SAXENDA 18 MG/3ML ~~LOC~~ SOPN: 3.0000 mg | PEN_INJECTOR | Freq: Every day | SUBCUTANEOUS | 0 refills | Status: DC

## 2020-10-25 MED ORDER — BD PEN NEEDLE NANO U/F 32G X 4 MM MISC: 0 refills | Status: DC

## 2020-10-25 MED ORDER — SAXENDA 18 MG/3ML ~~LOC~~ SOPN
3.0000 mg | PEN_INJECTOR | Freq: Every day | SUBCUTANEOUS | 0 refills | Status: DC
Start: 2020-10-25 — End: 2020-12-25

## 2020-10-29 NOTE — Progress Notes (Signed)
Chief Complaint:   OBESITY Sally [name] is here to discuss her progress with her obesity treatment plan along with follow-up of her obesity related diagnoses. Ludene is on keeping a food journal and adhering to recommended goals of 1300-1600 calories and 85 grams of protein daily and states she is following her eating plan approximately 40% of the time. Dashana states she is doing 0 minutes 0 times per week.  Today's visit was #: 6 Starting weight: 273 lbs Starting date: 08/07/2020 Today's weight: 269 lbs Today's date: 10/25/2020 Total lbs lost to date: 4 Total lbs lost since last in-office visit: 0  Interim History: Otilia has done well with maintaining her weight loss. She has a close friend in Hospice and she hasn't been able to concentrate on weight loss as much. She is doing well on Saxenda and she is hoping to increase the dose.  Subjective:   1. Grieving Copper's close friend is in hospice and it looks like she doesn't have much time left. She is trying to spend as much time with her as possible, but naturally finding this extremely difficult.   2. Other depression with emotional eating Janeene started Topamax at 50 mg, but it made her very sleepy so she stopped.  Assessment/Plan:   1. Grieving Ziyon was offered support and encouragement. She was given a list of mental health resources which should be able to help her deal with her grieving.  2. Other depression with emotional eating Jarvis is ok to restart Topamax at 1/2 PO qhs as needed, and we will follow up at her next visit.  3. Obesity with current BMI 41.0 Simra is currently in the action stage of change. As such, her goal is to continue with weight loss efforts. She has agreed to keeping a food journal and adhering to recommended goals of 1300-1600 calories and 85 grams of protein daily.   We discussed various medication options to help Krishna with her weight loss efforts and we both agreed to increase Saxenda to 1.5 mg and  then 1.8 mg (we will refill 3.0 mg for 1 month now), and we will refill nano needles #100 with no refills.  - Insulin Pen Needle (BD PEN NEEDLE NANO U/F) 32G X 4 MM MISC; Use Nano needle with Saxenda  Dispense: 100 each; Refill: 0 - Liraglutide -Weight Management (SAXENDA) 18 MG/3ML SOPN; Inject 3 mg into the skin daily. Increase to 1.5-1.8 daily  Dispense: 15 mL; Refill: 0  Behavioral modification strategies: increasing lean protein intake.  Tania has agreed to follow-up with our clinic in 3 to 4 weeks. She was informed of the importance of frequent follow-up visits to maximize her success with intensive lifestyle modifications for her multiple health conditions.   Objective:   Blood pressure 131/84, pulse 73, temperature 97.8 F (36.6 C), height 5\' 8"  (1.727 m), weight 269 lb (122 kg), last menstrual period 10/26/2015, SpO2 96 %. Body mass index is 40.9 kg/m.  General: Cooperative, alert, well developed, in no acute distress. HEENT: Conjunctivae and lids unremarkable. Cardiovascular: Regular rhythm.  Lungs: Normal work of breathing. Neurologic: No focal deficits.   Lab Results  Component Value Date   CREATININE 0.82 08/07/2020   BUN 12 08/07/2020   NA 141 08/07/2020   K 4.5 08/07/2020   CL 109 (H) 08/07/2020   CO2 19 (L) 08/07/2020   Lab Results  Component Value Date   ALT 16 08/07/2020   AST 16 08/07/2020   ALKPHOS 101 08/07/2020  BILITOT 0.4 08/07/2020   Lab Results  Component Value Date   HGBA1C 5.3 08/07/2020   HGBA1C 5.3 04/03/2015   Lab Results  Component Value Date   INSULIN 4.9 08/07/2020   Lab Results  Component Value Date   TSH 0.05 (L) 08/12/2016   Lab Results  Component Value Date   CHOL 140 09/03/2016   HDL 28.40 (L) 09/03/2016   LDLCALC 93 09/03/2016   TRIG 94.0 09/03/2016   CHOLHDL 5 09/03/2016   Lab Results  Component Value Date   VD25OH 34 01/08/2016   VD25OH 21 (L) 10/19/2015   VD25OH 24 (L) 06/28/2015   Lab Results  Component  Value Date   WBC 8.9 10/20/2018   HGB 15.1 (H) 10/20/2018   HCT 44.5 10/20/2018   MCV 98.0 10/20/2018   PLT 194 10/20/2018   Lab Results  Component Value Date   FERRITIN 67 07/27/2014    Obesity Behavioral Intervention:   Approximately 15 minutes were spent on the discussion below.  ASK: We discussed the diagnosis of obesity with Larcenia today and Merriel agreed to give [place] permission to discuss obesity behavioral modification therapy today.  ASSESS: Avalynne has the diagnosis of obesity and her BMI today is 41.0. Jaydi is in the action stage of change.   ADVISE: Elianys was educated on the multiple health risks of obesity as well as the benefit of weight loss to improve her health. She was advised of the need for long term treatment and the importance of lifestyle modifications to improve her current health and to decrease her risk of future health problems.  AGREE: Multiple dietary modification options and treatment options were discussed and Azrielle agreed to follow the recommendations documented in the above note.  ARRANGE: Jalacia was educated on the importance of frequent visits to treat obesity as outlined per CMS and USPSTF guidelines and agreed to schedule her next follow up appointment today.  Attestation Statements:   Reviewed by clinician on day of visit: allergies, medications, problem list, medical history, surgical history, family history, social history, and previous encounter notes.   I, Trixie Dredge, am acting as transcriptionist for [name], MD.  I have reviewed the above documentation for accuracy and completeness, and I agree with the above. -  [name], MD

## 2020-11-06 ENCOUNTER — Encounter: Payer: Self-pay | Admitting: Psychology

## 2020-11-06 ENCOUNTER — Encounter: Payer: No Typology Code available for payment source | Attending: Psychology | Admitting: Psychology

## 2020-11-06 ENCOUNTER — Other Ambulatory Visit: Payer: Self-pay

## 2020-11-06 DIAGNOSIS — M797 Fibromyalgia: Secondary | ICD-10-CM | POA: Insufficient documentation

## 2020-11-06 DIAGNOSIS — M5020 Other cervical disc displacement, unspecified cervical region: Secondary | ICD-10-CM | POA: Insufficient documentation

## 2020-11-06 DIAGNOSIS — F32A Depression, unspecified: Secondary | ICD-10-CM | POA: Diagnosis present

## 2020-11-06 DIAGNOSIS — G894 Chronic pain syndrome: Secondary | ICD-10-CM | POA: Insufficient documentation

## 2020-11-06 DIAGNOSIS — F419 Anxiety disorder, unspecified: Secondary | ICD-10-CM | POA: Insufficient documentation

## 2020-11-06 DIAGNOSIS — G90513 Complex regional pain syndrome I of upper limb, bilateral: Secondary | ICD-10-CM | POA: Insufficient documentation

## 2020-11-06 NOTE — Progress Notes (Signed)
Patient:  Sally [name]   DOB: 1975/04/10  MR Number: [id number]  Location: [facility] [address], [address] [id number] [facility] Sally [name] [id number] Dept: [phone]  Start: 11 AM End: 12 PM  Today's visit was an in person visit is conducted in my outpatient clinic office with the patient and myself present in the office.   Provider/Observer:     Edgardo Roys PsyD  Chief Complaint:      Chief Complaint  Patient presents with   Anxiety   Depression   Pain   Neck Pain    Reason For Service:     Sally [name] is a 45 year old female referred by [facility] for therapeutic interventions due to chronic pain symptoms.  The patient reports that she has great difficulty standing, walking for long times, elbow pain, migraines, hip pain, shoulder and neck pain.  The patient fell at work and fractured both her elbows and also had knee replacement surgery.  The patient has been taking maintenance doses of opioid pain medications for some time.  The patient reports that she has a great deal of difficulty functioning and has very poor sleep and wakes up numerous times each night.  The patient also has dealt with depression and anxiety for many years and has been hospitalized for depression anxiety years ago.  She has been receiving counseling for anxiety and depression and takes Trintellix.   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   The patient describes chronic issues of depression anxiety but the development of severe chronic pain.  The patient reports that she is  continuing to have some pain in her back post surgery and had a recent fall.  Follow-up MRI did not suggest any structural damage or injury from this fall.  The patient reports that her symptoms associated with complex regional pain syndrome have worsened recently.  11/01/2019: The patient is continued to have significant pain and distress with worsening episodes of her complex regional pain syndrome and fibromyalgia type symptoms.  The patient has been very frustrated at times with her lack of ability to function and do daily activities.  01/03/2020: The patient reports that she is continued to have significant pain but has particularly had worsening of her fatigue and somnolence.  She reports that she feels like she is sleeping an adequate amount of time but is very tired and fatigued throughout the day.  02/28/2020: The patient reports that she has had some worsening in her significant pain symptoms with cold and atmospheric changes.  She continues to have significant issues with her upper extremities related to complex regional pain syndrome and fibromyalgia.  The patient also reports that she has had significant psychosocial stressors with her closest friend having terminal cancer and being given 3 months to live.  The patient and her friend are talking about the patient taking over care of the patient's granddaughter and the patient also feels a great deal of need to do what ever she can for her friend going forward.  This is a very difficult and stressful situation for the patient on top of her  numerous medical and pain issues.  The patient reports that her sleep continues to be improved but limited with pain disrupting sleep patterns.  The patient has been followed by Dr. Wess Botts MD [facility] for some time for pain management and she is now been referred for an evaluation for spinal cord stimulator trialing and possible implantation as well as being scheduled for ketamine infusions.  They  are still working on Designer, television/film set. coverage for the ketamine infusions.  I think that the patient is an excellent candidate for both of these possible options.  The patient and I talked about the appropriateness for ketamine infusions due to her wide spectrum of issues for some time now and I am quite pleased to find out that her treating pain specialist's clinic is now doing ketamine infusions that she appears to be an excellent candidate.  She also appears to be an excellent candidate for spinal cord stimulator trialing and possible implantation.  03/27/2020: The patient reports that she has gone through all of the preliminary work-up to have a spinal cord stimulator trial conducted and is looking forward with hope that it will help with her significant cervical related pain.  The patient is also has an initial appointment to assess for possible ketamine infusion therapies as well.  We reviewed issues related to both the spinal cord stimulator and ketamine infusion with the patient as well as worked on therapeutic interventions around her chronic pain/complex regional pain syndrome diagnoses.  The patient reports that she still has significant psychosocial stressors with her closest friend dying from breast cancer and in the last month or 2 of her life.  At this point, the patient does not look like she will have to take over her friend's granddaughters care as the family is hoping that an uncle of the young girl will take over care.  04/24/2020: The patient returns reporting that she has gotten her spinal cord stimulator trialing device and has it implanted currently.  However, there have been mechanical problems with the device and it has been shutting off independently and the patient has not been able to get a good reading on how much it has helped her.  The patient reports that she did experience some brief improvement when she knew that it was on but it would turn off.  She is gone back to  the device maker and they could not figure out the problem.  They are going to put a new external device in and extend the spinal cord stimulator trial in face for 2 days.  The patient has also had her first ketamine infusion trial.  She reports that she was told she was hallucinating and dissociative during this phase and experienced some improvement over the rest of the day as far as her mood and pain symptoms but it was over within 24 hours.  The patient is scheduled to have her next ketamine infusion coming up.  08/07/2020: The patient reports that they are looking at doing a new spinal cord stimulator trial as the device did not work properly during her 7-day trial.  In fact, they attempted to extended to 10 days hoping to get it working correctly.  However, the patient reports that she felt like it did help the first day when it was apparently working but shortly stopped working and it sounds like there were never able to get it working properly.  The patient reports that at this point, her anesthesiologist/pain specialist does not think  that she should have any more ketamine trials but the patient would like to try them again.  The patient reports that the doctor felt that she had an adverse reaction to it but her husband was present and did not describe any severe reaction.  At this point, she is planning to talk to him again about possibly having another ketamine infusion if possible.  The patient reports that she continues with her significant chronic pain symptoms but she is managing day-to-day.  She reports that there continues to be a major stressor in her life relative to a very close friend who has terminal cancer and the cancer is continuing to progress.  09/11/2020: The patient reports that her friend is now being managed by hospice and appears to be at the end of her life.  This has been stressful but the patient has been able to adjust to the impending reality with time and she feels like she is  handling it fairly well.  The patient reports that she continues to have significant pain in her arms radiating from her neck.  The patient reports that at this point she has not been able to return for ketamine therapies and not discussed strategies to follow-up with [facility].  11/06/2020: The patient has continued to have a lot of stress with depression and anxiety worsening particular around the impending death of a very close friend.  The friend is dying from metastatic cancer and the patient is one of the primary helpers for her friend.  The patient is very close to this friend.  The patient interacts with hospice and is spending a lot of time helping.  This is been coinciding with a worsening of her cervical pain radiating down her right arm primarily.  She continues to struggle with severe pain and the depression and anxiety associated around all of these issues has been worse.  Interventions Strategy:  Cognitive/behavioral therapeutic interventions along with coping skills and strategies around chronic pain, sleep disturbance and depression/anxiety.  Participation Level:   Active  Participation Quality:  Appropriate and Attentive      Behavioral Observation:  Well Groomed, Alert, and Appropriate.   Current Psychosocial Factors: The patient has had a lot of increased stress with her friend nearing the end of the friend's life dealing with metastatic cancer.   Content of Session:   Reviewed current symptoms and continue to work on therapeutic interventions around issues related to her chronic pain and depression as well as issues related to her chronic complex regional pain syndrome and fibromyalgia symptoms.   Current Status:   The patient is hoping that they are able to do another spinal cord stimulator trial as she felt like it was helpful the first day before the device stopped working.  Patient Progress:   The patient reports that her overall sleep has improved from  initial status but continues to be problematic.  The patient reports that her vein plays a big role in her difficulty sleeping.  Depression anxiety continue to be quite problematic for the patient.  Impression/Diagnosis:   Sally [name] is a 45 year old female referred by [place] for therapeutic interventions due to chronic pain symptoms.  The patient reports that she has great difficulty standing, walking for long times, elbow pain, migraines, hip pain, shoulder and neck pain.  The patient fell at work and fractured both her elbows and also had knee replacement surgery.  The patient has been taking maintenance doses of opioid pain medications for some  time.  The patient reports that she has a great deal of difficulty functioning and has very poor sleep and wakes up numerous times each night.  The patient also has dealt with depression and anxiety for many years and has been hospitalized for depression anxiety years ago.  She has been receiving counseling for anxiety and depression and takes Trintellix.   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   The patient describes chronic issues of depression anxiety but the development of severe chronic pain  After a fall at work in 2015 where she fractured both of her elbows, injured her knee and developed fibromyalgia/regional pain syndrome.  The patient reports that she has ongoing difficulties walking distances or standing.  She describes elbow pain, trouble lifting things and trouble writing for any period of time.  The patient describes significant sleep disturbance and is unable to fall asleep at night.  She reports that she is always tired.   The patient describes a normal appetite.  The patient describes memory issues related to trouble remembering things and concentration issues.  The patient reports that she is significantly less active with her children and husband and that her husband has to help more due to her significant pain.  We have continue to work on issues related to her pain and sleep status along with issues of depression and anxiety.  02/28/2020: The patient reports that she has had some worsening in her significant pain symptoms with cold and atmospheric changes.  She continues to have significant issues with her upper extremities related to complex regional pain syndrome and fibromyalgia.  The patient also reports that she has had significant psychosocial stressors with her closest friend having terminal cancer and being given 3 months to live.  The patient and her friend are talking about the patient taking over care of the patient's granddaughter and the patient also feels a great deal of need to do what ever she can for her friend going forward.  This is a very difficult and stressful situation for the patient on top of her numerous medical and pain issues.  The patient reports that her sleep continues to be improved but limited with pain disrupting sleep patterns.  The patient has been followed by Dr. Wess Botts MD [facility]) for some time for pain management and she is now been referred for an evaluation for spinal cord stimulator trialing and possible implantation as well as being scheduled for ketamine infusions.  They are still working on Designer, television/film set. coverage for the ketamine infusions.  I think that the patient is an excellent candidate for both of these possible options.  The patient and I talked about the appropriateness for ketamine infusions due to her wide spectrum of issues for some time now and I am quite pleased to find out that her treating pain specialist's clinic is now doing ketamine  infusions that she appears to be an excellent candidate.  She also appears to be an excellent candidate for spinal cord stimulator trialing and possible implantation.  04/24/2020: The patient returns reporting that she has gotten her spinal cord stimulator trialing device and has it implanted currently.  However, there  have been mechanical problems with the device and it has been shutting off independently and the patient has not been able to get a good reading on how much it has helped her.  The patient reports that she did experience some brief improvement when she knew that it was on but it would turn off.  She is gone back to the device maker and they could not figure out the problem.  They are going to put a new external device in and extend the spinal cord stimulator trial in face for 2 days.  The patient has also had her first ketamine infusion trial.  She reports that she was told she was hallucinating and dissociative during this phase and experienced some improvement over the rest of the day as far as her mood and pain symptoms but it was over within 24 hours.  The patient is scheduled to have her next ketamine infusion coming up.  08/07/2020: The patient reports that they are looking at doing a new spinal cord stimulator trial as the device did not work properly during her 7-day trial.  In fact, they attempted to extended to 10 days hoping to get it working correctly.  However, the patient reports that she felt like it did help the first day when it was apparently working but shortly stopped working and it sounds like there were never able to get it working properly.  The patient reports that at this point, her anesthesiologist/pain specialist does not think that she should have any more ketamine trials but the patient would like to try them again.  The patient reports that the doctor felt that she had an adverse reaction to it but her husband was present and did not describe any severe reaction.  At  this point, she is planning to talk to him again about possibly having another ketamine infusion if possible.  The patient reports that she continues with her significant chronic pain symptoms but she is managing day-to-day.  She reports that there continues to be a major stressor in her life relative to a very close friend who has terminal cancer and the cancer is continuing to progress.  09/11/2020: The patient reports that her friend is now being managed by hospice and appears to be at the end of her life.  This has been stressful but the patient has been able to adjust to the impending reality with time and she feels like she is handling it fairly well.  The patient reports that she continues to have significant pain in her arms radiating from her neck.  The patient reports that at this point she has not been able to return for ketamine therapies and not discussed strategies to follow-up with [facility].  11/06/2020: The patient has continued to have a lot of stress with depression and anxiety worsening particular around the impending death of a very close friend.  The friend is dying from metastatic cancer and the patient is one of the primary helpers for her friend.  The patient is very close to this friend.  The patient interacts with hospice and is spending a lot of time helping.  This is been coinciding with a worsening of her cervical pain radiating down her right arm primarily.  She continues to struggle with severe pain and the depression and anxiety associated around all of these issues has been worse.   Diagnosis: Complex regional pain syndrome, fibromyalgia, anxiety and depression, neck, shoulder and back pain.

## 2020-11-20 ENCOUNTER — Ambulatory Visit (INDEPENDENT_AMBULATORY_CARE_PROVIDER_SITE_OTHER): Payer: Medicare Other | Admitting: Family Medicine

## 2020-12-11 ENCOUNTER — Encounter: Payer: No Typology Code available for payment source | Attending: Psychology | Admitting: Psychology

## 2020-12-11 ENCOUNTER — Other Ambulatory Visit: Payer: Self-pay

## 2020-12-11 DIAGNOSIS — F32A Depression, unspecified: Secondary | ICD-10-CM | POA: Diagnosis present

## 2020-12-11 DIAGNOSIS — G90513 Complex regional pain syndrome I of upper limb, bilateral: Secondary | ICD-10-CM | POA: Insufficient documentation

## 2020-12-11 DIAGNOSIS — F419 Anxiety disorder, unspecified: Secondary | ICD-10-CM | POA: Diagnosis not present

## 2020-12-11 DIAGNOSIS — G894 Chronic pain syndrome: Secondary | ICD-10-CM | POA: Insufficient documentation

## 2020-12-11 DIAGNOSIS — M797 Fibromyalgia: Secondary | ICD-10-CM | POA: Insufficient documentation

## 2020-12-17 ENCOUNTER — Encounter: Payer: Self-pay | Admitting: Psychology

## 2020-12-17 NOTE — Progress Notes (Signed)
Patient:  Sally [name]   DOB: 1975-05-10  MR Number: [id number]  Location: [facility] [place], [address] [id number] [place] [id number] Dept: [phone]  Start: 10 AM End: 11 AM  Today's visit was an in person visit is conducted in my outpatient clinic office with the patient and myself present in the office.   Provider/Observer:     Edgardo Roys PsyD  Chief Complaint:      Chief Complaint  Patient presents with   Anxiety   Depression   Pain   Neck Pain    Reason For Service:     Sally [name] is a 45 year old female referred by [facility] for therapeutic interventions due to chronic pain symptoms.  The patient reports that she has great difficulty standing, walking for long times, elbow pain, migraines, hip pain, shoulder and neck pain.  The patient fell at work and fractured both her elbows and also had knee replacement surgery.  The patient has been taking maintenance doses of opioid pain medications for some time.  The patient reports that she has a great deal of difficulty functioning and has very poor sleep and wakes up numerous times each night.  The patient also has dealt with depression and anxiety for many years and has been hospitalized for depression anxiety years ago.  She has been receiving counseling for anxiety and depression and takes Trintellix.   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   The patient describes chronic issues of depression anxiety but the development of severe chronic pain.  The patient reports that she is  continuing to have some pain in her back post surgery and had a recent fall.  Follow-up MRI did not suggest any structural damage or injury from this fall.  The patient reports that her symptoms associated with complex regional pain syndrome have worsened recently.  11/01/2019: The patient is continued to have significant pain and distress with worsening episodes of her complex regional pain syndrome and fibromyalgia type symptoms.  The patient has been very frustrated at times with her lack of ability to function and do daily activities.  01/03/2020: The patient reports that she is continued to have significant pain but has particularly had worsening of her fatigue and somnolence.  She reports that she feels like she is sleeping an adequate amount of time but is very tired and fatigued throughout the day.  02/28/2020: The patient reports that she has had some worsening in her significant pain symptoms with cold and atmospheric changes.  She continues to have significant issues with her upper extremities related to complex regional pain syndrome and fibromyalgia.  The patient also reports that she has had significant psychosocial stressors with her closest friend having terminal cancer and being given 3 months to live.  The patient and her friend are talking about the patient taking over care of the patient's granddaughter and the patient also feels a great deal of need to do what ever she can for her friend going forward.  This is a very difficult and stressful situation for the patient on top of her  numerous medical and pain issues.  The patient reports that her sleep continues to be improved but limited with pain disrupting sleep patterns.  The patient has been followed by Dr. Wess Botts MD [facility] for some time for pain management and she is now been referred for an evaluation for spinal cord stimulator trialing and possible implantation as well as being scheduled for ketamine infusions.  They  are still working on Designer, television/film set. coverage for the ketamine infusions.  I think that the patient is an excellent candidate for both of these possible options.  The patient and I talked about the appropriateness for ketamine infusions due to her wide spectrum of issues for some time now and I am quite pleased to find out that her treating pain specialist's clinic is now doing ketamine infusions that she appears to be an excellent candidate.  She also appears to be an excellent candidate for spinal cord stimulator trialing and possible implantation.  03/27/2020: The patient reports that she has gone through all of the preliminary work-up to have a spinal cord stimulator trial conducted and is looking forward with hope that it will help with her significant cervical related pain.  The patient is also has an initial appointment to assess for possible ketamine infusion therapies as well.  We reviewed issues related to both the spinal cord stimulator and ketamine infusion with the patient as well as worked on therapeutic interventions around her chronic pain/complex regional pain syndrome diagnoses.  The patient reports that she still has significant psychosocial stressors with her closest friend dying from breast cancer and in the last month or 2 of her life.  At this point, the patient does not look like she will have to take over her friend's granddaughters care as the family is hoping that an uncle of the young girl will take over care.  04/24/2020: The patient returns reporting that she has gotten her spinal cord stimulator trialing device and has it implanted currently.  However, there have been mechanical problems with the device and it has been shutting off independently and the patient has not been able to get a good reading on how much it has helped her.  The patient reports that she did experience some brief improvement when she knew that it was on but it would turn off.  She is gone back to  the device maker and they could not figure out the problem.  They are going to put a new external device in and extend the spinal cord stimulator trial in face for 2 days.  The patient has also had her first ketamine infusion trial.  She reports that she was told she was hallucinating and dissociative during this phase and experienced some improvement over the rest of the day as far as her mood and pain symptoms but it was over within 24 hours.  The patient is scheduled to have her next ketamine infusion coming up.  08/07/2020: The patient reports that they are looking at doing a new spinal cord stimulator trial as the device did not work properly during her 7-day trial.  In fact, they attempted to extended to 10 days hoping to get it working correctly.  However, the patient reports that she felt like it did help the first day when it was apparently working but shortly stopped working and it sounds like there were never able to get it working properly.  The patient reports that at this point, her anesthesiologist/pain specialist does not think  that she should have any more ketamine trials but the patient would like to try them again.  The patient reports that the doctor felt that she had an adverse reaction to it but her husband was present and did not describe any severe reaction.  At this point, she is planning to talk to him again about possibly having another ketamine infusion if possible.  The patient reports that she continues with her significant chronic pain symptoms but she is managing day-to-day.  She reports that there continues to be a major stressor in her life relative to a very close friend who has terminal cancer and the cancer is continuing to progress.  09/11/2020: The patient reports that her friend is now being managed by hospice and appears to be at the end of her life.  This has been stressful but the patient has been able to adjust to the impending reality with time and she feels like she is  handling it fairly well.  The patient reports that she continues to have significant pain in her arms radiating from her neck.  The patient reports that at this point she has not been able to return for ketamine therapies and not discussed strategies to follow-up with [facility].  11/06/2020: The patient has continued to have a lot of stress with depression and anxiety worsening particular around the impending death of a very close friend.  The friend is dying from metastatic cancer and the patient is one of the primary helpers for her friend.  The patient is very close to this friend.  The patient interacts with hospice and is spending a lot of time helping.  This is been coinciding with a worsening of her cervical pain radiating down her right arm primarily.  She continues to struggle with severe pain and the depression and anxiety associated around all of these issues has been worse.  12/11/2020: The patient reports that she has had a very stressful time recently.  Her close friend has passed away and while her friend passed away peacefully and was in the care of hospice at the end of her life there is [employer] "shit show" happening around her friend's death.  The patient reports that the friends son who has been very active in the friend's life tragically died just a couple of days after the friend's death in a motor vehicle accident.  The son who had had little interaction with her friend did show up and there was conflict between the friend's new husband and the son regarding ownership of some of the property.  The friend had completed an extensive well that gave all of her estate to the grandchildren but there was still a great deal of arguing.  The husband had lifetime rights to the house for now.  The patient reports that the death of her friend's son happened before the funeral and so they ended up having a joint funeral for the son and the patient's friend.  The patient reports that her  depression has been very severe and the fact that she is the executor for her friend's estate has had a great deal of stress.  Her pain continues to be very severe.  Interventions Strategy:  Cognitive/behavioral therapeutic interventions along with coping skills and strategies around chronic pain, sleep disturbance and depression/anxiety.  Participation Level:   Active  Participation Quality:  Appropriate and Attentive      Behavioral Observation:  Well Groomed, Alert, and Appropriate.   Current Psychosocial Factors: The  patient has had a lot of increased stress with her friend nearing the end of the friend's life dealing with metastatic cancer.   Content of Session:   Reviewed current symptoms and continue to work on therapeutic interventions around issues related to her chronic pain and depression as well as issues related to her chronic complex regional pain syndrome and fibromyalgia symptoms.   Current Status:   The patient is hoping that they are able to do another spinal cord stimulator trial as she felt like it was helpful the first day before the device stopped working.  Patient Progress:   The patient reports that her overall sleep has improved from initial status but continues to be problematic.  The patient reports that her vein plays a big role in her difficulty sleeping.  Depression anxiety continue to be quite problematic for the patient.  Impression/Diagnosis:   Sally [name] is a 45 year old female referred by [facility] for therapeutic interventions due to chronic pain symptoms.  The patient reports that she has great difficulty standing, walking for long times, elbow pain, migraines, hip pain, shoulder and neck pain.  The patient fell at work and fractured both her elbows and also had knee replacement surgery.  The patient has been taking maintenance doses of opioid pain medications for some time.  The patient reports that she has a great deal of difficulty functioning  and has very poor sleep and wakes up numerous times each night.  The patient also has dealt with depression and anxiety for many years and has been hospitalized for depression anxiety years ago.  She has been receiving counseling for anxiety and depression and takes Trintellix.   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   The patient describes chronic issues of depression anxiety but the development of severe chronic pain  After a fall at work in 2015 where she fractured both of her elbows, injured her knee and developed fibromyalgia/regional pain syndrome.  The patient reports that she has ongoing difficulties walking distances or standing.  She describes elbow pain, trouble lifting things and trouble writing for any period of time.  The patient describes significant sleep disturbance and is unable to fall asleep at night.  She reports that she is always tired.  The patient describes a normal appetite.  The patient describes memory issues related to trouble remembering things and concentration issues.  The patient reports that she is significantly less active with her children and husband and that her husband has to help more due to her significant pain.  We have continue to work on issues related to her pain and sleep status along with issues of depression and anxiety.  02/28/2020: The patient reports that she has had some worsening in her significant pain symptoms with cold and atmospheric changes.  She continues to have significant issues with her upper extremities related to complex regional pain syndrome and fibromyalgia.  The patient also reports that she has had significant psychosocial  stressors with her closest friend having terminal cancer and being given 3 months to live.  The patient and her friend are talking about the patient taking over care of the patient's granddaughter and the patient  also feels a great deal of need to do what ever she can for her friend going forward.  This is a very difficult and stressful situation for the patient on top of her numerous medical and pain issues.  The patient reports that her sleep continues to be improved but limited with pain disrupting sleep patterns.  The patient has been followed by Dr. Wess Botts MD [facility]) for some time for pain management and she is now been referred for an evaluation for spinal cord stimulator trialing and possible implantation as well as being scheduled for ketamine infusions.  They are still working on Designer, television/film set. coverage for the ketamine infusions.  I think that the patient is an excellent candidate for both of these possible options.  The patient and I talked about the appropriateness for ketamine infusions due to her wide spectrum of issues for some time now and I am quite pleased to find out that her treating pain specialist's clinic is now doing ketamine infusions that she appears to be an excellent candidate.  She also appears to be an excellent candidate for spinal cord stimulator trialing and possible implantation.  04/24/2020: The patient returns reporting that she has gotten her spinal cord stimulator trialing device and has it implanted currently.  However, there have been mechanical problems with the device and it has been shutting off independently and the patient has not been able to get a good reading on how much it has helped her.  The patient reports that she did experience some brief improvement when she knew that it was on but it would turn off.  She is gone back to the device maker and they could not figure out the problem.  They are going to put a new external device in and  extend the spinal cord stimulator trial in face for 2 days.  The patient has also had her first ketamine infusion trial.  She reports that she was told she was hallucinating and dissociative during this phase and experienced some improvement over the rest of the day as far as her mood and pain symptoms but it was over within 24 hours.  The patient is scheduled to have her next ketamine infusion coming up.  08/07/2020: The patient reports that they are looking at doing a new spinal cord stimulator trial as the device did not work properly during her 7-day trial.  In fact, they attempted to extended to 10 days hoping to get it working correctly.  However, the patient reports that she felt like it did help the first day when it was apparently working but shortly stopped working and it sounds like there were never able to get it working properly.  The patient reports that at this point, her anesthesiologist/pain specialist does not think that she should have any more ketamine trials but the patient would like to try them again.  The patient reports that the doctor felt that she had an adverse reaction to it but her husband was present and did not describe any severe reaction.  At this point, she is planning to talk to him again about possibly having another ketamine infusion if possible.  The patient reports that she continues with her significant chronic pain symptoms but she is managing day-to-day.  She reports that there continues to be a major stressor in her life relative to a very close friend who has terminal cancer and the cancer is continuing to progress.  09/11/2020: The patient reports that her  friend is now being managed by hospice and appears to be at the end of her life.  This has been stressful but the patient has been able to adjust to the impending reality with time and she feels like she is handling it fairly well.  The patient reports that she continues to have significant pain in her arms radiating  from her neck.  The patient reports that at this point she has not been able to return for ketamine therapies and not discussed strategies to follow-up with [facility].  11/06/2020: The patient has continued to have a lot of stress with depression and anxiety worsening particular around the impending death of a very close friend.  The friend is dying from metastatic cancer and the patient is one of the primary helpers for her friend.  The patient is very close to this friend.  The patient interacts with hospice and is spending a lot of time helping.  This is been coinciding with a worsening of her cervical pain radiating down her right arm primarily.  She continues to struggle with severe pain and the depression and anxiety associated around all of these issues has been worse.  12/11/2020: The patient reports that she has had a very stressful time recently.  Her close friend has passed away and while her friend passed away peacefully and was in the care of hospice at the end of her life there is [employer] "shit show" happening around her friend's death.  The patient reports that the friends son who has been very active in the friend's life tragically died just a couple of days after the friend's death in a motor vehicle accident.  The son who had had little interaction with her friend did show up and there was conflict between the friend's new husband and the son regarding ownership of some of the property.  The friend had completed an extensive well that gave all of her estate to the grandchildren but there was still a great deal of arguing.  The husband had lifetime rights to the house for now.  The patient reports that the death of her friend's son happened before the funeral and so they ended up having a joint funeral for the son and the patient's friend.  The patient reports that her depression has been very severe and the fact that she is the executor for her friend's estate has had a great  deal of stress.  Her pain continues to be very severe.  Today we continue to work on therapeutic interventions for coping and adjustment in dealing with significant depression and recent exacerbations and stress.   Diagnosis: Complex regional pain syndrome, fibromyalgia, anxiety and depression, neck, shoulder and back pain.

## 2020-12-25 ENCOUNTER — Other Ambulatory Visit: Payer: Self-pay

## 2020-12-25 ENCOUNTER — Encounter (INDEPENDENT_AMBULATORY_CARE_PROVIDER_SITE_OTHER): Payer: Self-pay | Admitting: Family Medicine

## 2020-12-25 ENCOUNTER — Ambulatory Visit (INDEPENDENT_AMBULATORY_CARE_PROVIDER_SITE_OTHER): Payer: Medicare Other | Admitting: Family Medicine

## 2020-12-25 VITALS — BP 131/58 | HR 88 | Temp 98.4°F | Ht 68.0 in | Wt 266.0 lb

## 2020-12-25 DIAGNOSIS — Z6841 Body Mass Index (BMI) 40.0 and over, adult: Secondary | ICD-10-CM

## 2020-12-25 DIAGNOSIS — F4321 Adjustment disorder with depressed mood: Secondary | ICD-10-CM

## 2020-12-25 DIAGNOSIS — E66813 Obesity, class 3: Secondary | ICD-10-CM

## 2020-12-25 DIAGNOSIS — F418 Other specified anxiety disorders: Secondary | ICD-10-CM | POA: Diagnosis not present

## 2020-12-25 DIAGNOSIS — Z9189 Other specified personal risk factors, not elsewhere classified: Secondary | ICD-10-CM

## 2020-12-25 MED ORDER — SAXENDA 18 MG/3ML ~~LOC~~ SOPN: 3.0000 mg | PEN_INJECTOR | Freq: Every day | SUBCUTANEOUS | 0 refills | Status: DC

## 2020-12-25 MED ORDER — BD PEN NEEDLE NANO 2ND GEN 32G X 4 MM MISC
1.0000 | Freq: Two times a day (BID) | 0 refills | Status: DC
Start: 2020-12-25 — End: 2021-03-19

## 2020-12-25 NOTE — Progress Notes (Signed)
Chief Complaint:   OBESITY Sally [name] is here to discuss her progress with her obesity treatment plan along with follow-up of her obesity related diagnoses. Sally [name] is on keeping a food journal and adhering to recommended goals of 1300-1600 calories and 85 grams of protein daily and states she is following her eating plan approximately 0% of the time. Sally [name] states she is doing 0 minutes 0 times per week.  Today's visit was #: 7 Starting weight: 273 lbs Starting date: 08/07/2020 Today's weight: 266 lbs Today's date: 12/25/2020 Total lbs lost to date: 7 Total lbs lost since last in-office visit: 3  Interim History: Sally [name] has done well with weight loss, but she hasn't been able to concentrate on weight loss due to recent deaths of people close to her.  Subjective:   1. Grief Sally [name] is tearful in our office today as she is dealing with grief. She saw a grief counselor and she is planning on going to group sessions. She denies suicidal or homicidal ideations.  2. Depression with anxiety Sally [name] is seeing a Psychiatrist, and she is maintaining on Wellbutrin and Prozac. She has a few Xanax for as needed use. She is tearful in the office today.  3. At risk for impaired metabolic function Sally [name] is at increased risk for impaired metabolic function if calorie and protein decreases.  Assessment/Plan:   1. Grief Sally [name] was offered support and encouragement, and will continue to follow up.  2. Depression with anxiety Sally [name] will continue her medications, and will work on appropriate sleep and will continue to follow up.  3. At risk for impaired metabolic function Sally [name] was given approximately 15 minutes of impaired  metabolic function prevention counseling today. We discussed intensive lifestyle modifications today with an emphasis on specific nutrition and exercise instructions and strategies.   Repetitive spaced learning was employed today to elicit superior memory formation and behavioral  change.  4. Obesity with current BMI 41.0 Sally [name] is currently in the action stage of change. As such, her goal is to continue with weight loss efforts. She has agreed to keeping a food journal and adhering to recommended goals of 1300-1600 calories and 85 grams of protein daily.   We discussed various medication options to help Sally [name] with her weight loss efforts and we both agreed to continue Saxenda 3.0 mg, and we will refill for 1 month (she is to stay at 1.8 mg for now); and we will refill nano needles #100 with no refills.  - Liraglutide -Weight Management (SAXENDA) 18 MG/3ML SOPN; Inject 3 mg into the skin daily. Increase to 1.5-1.8 daily  Dispense: 15 mL; Refill: 0 - Insulin Pen Needle (BD PEN NEEDLE NANO 2ND GEN) 32G X 4 MM MISC; 1 Package by Does not apply route 2 (two) times daily.  Dispense: 100 each; Refill: 0  Behavioral modification strategies: no skipping meals and emotional eating strategies.  Sally [name] has agreed to follow-up with our clinic in 2 weeks. She was informed of the importance of frequent follow-up visits to maximize her success with intensive lifestyle modifications for her multiple health conditions.   Objective:   Blood pressure (!) 131/58, pulse 88, temperature 98.4 F (36.9 C), height 5\' 8"  (1.727 m), weight 266 lb (120.7 kg), last menstrual period 10/26/2015, SpO2 98 %. Body mass index is 40.45 kg/m.  General: Cooperative, alert, well developed, in no acute distress. HEENT: Conjunctivae and lids unremarkable. Cardiovascular: Regular rhythm.  Lungs: Normal work of breathing. Neurologic: No focal deficits.   Lab  Results  Component Value Date   CREATININE 0.82 08/07/2020   BUN 12 08/07/2020   NA 141 08/07/2020   K 4.5 08/07/2020   CL 109 (H) 08/07/2020   CO2 19 (L) 08/07/2020   Lab Results  Component Value Date   ALT 16 08/07/2020   AST 16 08/07/2020   ALKPHOS 101 08/07/2020   BILITOT 0.4 08/07/2020   Lab Results  Component Value Date   HGBA1C 5.3  08/07/2020   HGBA1C 5.3 04/03/2015   Lab Results  Component Value Date   INSULIN 4.9 08/07/2020   Lab Results  Component Value Date   TSH 0.05 (L) 08/12/2016   Lab Results  Component Value Date   CHOL 140 09/03/2016   HDL 28.40 (L) 09/03/2016   LDLCALC 93 09/03/2016   TRIG 94.0 09/03/2016   CHOLHDL 5 09/03/2016   Lab Results  Component Value Date   VD25OH 34 01/08/2016   VD25OH 21 (L) 10/19/2015   VD25OH 24 (L) 06/28/2015   Lab Results  Component Value Date   WBC 8.9 10/20/2018   HGB 15.1 (H) 10/20/2018   HCT 44.5 10/20/2018   MCV 98.0 10/20/2018   PLT 194 10/20/2018   Lab Results  Component Value Date   FERRITIN 67 07/27/2014   Attestation Statements:   Reviewed by clinician on day of visit: allergies, medications, problem list, medical history, surgical history, family history, social history, and previous encounter notes.   I, Trixie Dredge, am acting as transcriptionist for [name], MD.  I have reviewed the above documentation for accuracy and completeness, and I agree with the above. -  [name], MD

## 2020-12-31 ENCOUNTER — Telehealth (INDEPENDENT_AMBULATORY_CARE_PROVIDER_SITE_OTHER): Payer: Self-pay | Admitting: Family Medicine

## 2020-12-31 ENCOUNTER — Encounter (INDEPENDENT_AMBULATORY_CARE_PROVIDER_SITE_OTHER): Payer: Self-pay

## 2020-12-31 NOTE — Telephone Encounter (Signed)
Prior authorization denied for Saxenda. Patient has not loss/maintain 4% weight loss. Patient sent mychart message.

## 2021-01-03 ENCOUNTER — Encounter: Payer: No Typology Code available for payment source | Admitting: Psychology

## 2021-01-10 ENCOUNTER — Ambulatory Visit (INDEPENDENT_AMBULATORY_CARE_PROVIDER_SITE_OTHER): Payer: Medicare Other | Admitting: Obstetrics & Gynecology

## 2021-01-10 ENCOUNTER — Other Ambulatory Visit: Payer: Self-pay

## 2021-01-10 ENCOUNTER — Other Ambulatory Visit (HOSPITAL_BASED_OUTPATIENT_CLINIC_OR_DEPARTMENT_OTHER): Payer: Self-pay | Admitting: *Deleted

## 2021-01-10 ENCOUNTER — Encounter (HOSPITAL_BASED_OUTPATIENT_CLINIC_OR_DEPARTMENT_OTHER): Payer: Self-pay | Admitting: Obstetrics & Gynecology

## 2021-01-10 VITALS — BP 123/72 | HR 79 | Ht 68.0 in | Wt 273.6 lb

## 2021-01-10 DIAGNOSIS — N764 Abscess of vulva: Secondary | ICD-10-CM | POA: Diagnosis not present

## 2021-01-10 MED ORDER — SULFAMETHOXAZOLE-TRIMETHOPRIM 800-160 MG PO TABS: 1.0000 | ORAL_TABLET | Freq: Two times a day (BID) | ORAL | 0 refills | Status: DC

## 2021-01-10 MED ORDER — FLUCONAZOLE 150 MG PO TABS: 150.0000 mg | ORAL_TABLET | Freq: Once | ORAL | 0 refills | Status: AC

## 2021-01-10 MED ORDER — FLUCONAZOLE 150 MG PO TABS: 150.0000 mg | ORAL_TABLET | Freq: Once | ORAL | 0 refills | Status: DC

## 2021-01-14 ENCOUNTER — Encounter (HOSPITAL_BASED_OUTPATIENT_CLINIC_OR_DEPARTMENT_OTHER): Payer: Self-pay | Admitting: Obstetrics & Gynecology

## 2021-01-15 NOTE — Progress Notes (Signed)
GYNECOLOGY  VISIT  CC:   labial lump  HPI: 45 y.o. G76P2002 Married White or Caucasian female here for complaint of tender lump that has been on her right labia for a few days.  It seems to have started over the weekend.  It has gotten tender.  No drainage.  No vaginal bleeding.  GYNECOLOGIC HISTORY: Patient's last menstrual period was 10/26/2015 (exact date). 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 issues wih back pain   Back pain 09/09/2012   It is intermittent now,  previously source of fibromyalgia dx.     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)    CRPS (complex regional pain syndrome), lower limb 2018   Depression    Edema of both lower extremities    Fibromyalgia    GERD (gastroesophageal reflux disease)    Headache    Hypothyroid 09/09/2012   Hypothyroidism    Joint pain    Osteoarthritis    Palpitation    only when having anxiety   Precancerous changes of the cervix    Sleep apnea    pre lap banding-never used cpap or mask   SOB (shortness of breath)    Swallowing difficulty    Vitamin D deficiency     Past Surgical History:  Procedure Laterality Date   ANTERIOR CERVICAL DECOMP/DISCECTOMY FUSION N/A 10/27/2018   Procedure: ANTERIOR  CERVICAL DECOMPRESSION/DISCECTOMY CERVICAL FIVE-SIX, CERVICAL SIX-SEVEN;  Surgeon: Melina Schools, MD;  Location: [facility];  Service: Orthopedics;  Laterality: N/A;   BILATERAL SALPINGECTOMY Bilateral 11/05/2015   Procedure: BILATERAL SALPINGECTOMY;  Surgeon: [name], MD;  Location: [facility];  Service: Gynecology;  Laterality: Bilateral;   BRAIN SURGERY  1992   remove blood clot after a fall   [place]  1997, 2013   cyst on ovary     CYSTOSCOPY N/A 11/05/2015   Procedure: CYSTOSCOPY;  Surgeon: [name], MD;  Location: [facility];  Service: Gynecology;  Laterality: N/A;   GASTRIC BANDING PORT REVISION  01/20/2012   Procedure: GASTRIC BANDING PORT REVISION;  Surgeon: Pedro Earls, MD;  Location: [facility];  Service: General;  Laterality: N/A;   KNEE ARTHROSCOPY Left    KNEE ARTHROSCOPY WITH DRILLING/MICROFRACTURE Right 12/06/2014   Procedure: KNEE ARTHROSCOPY WITH DRILLING/MICROFRACTURE, CHONDROPLASTY, EXCISION OF PLICA;  Surgeon: Dorna Leitz, MD;  Location: [facility];  Service: Orthopedics;  Laterality: Right;   KNEE SURGERY Right 06/26/2016   [facility]   LAPAROSCOPIC APPENDECTOMY N/A 09/09/2012   Procedure: APPENDECTOMY LAPAROSCOPIC;  Surgeon: Merrie Roof, MD;  Location: [facility];  Service: General;  Laterality: N/A;   [place] N/A 11/05/2015  Procedure: HYSTERECTOMY TOTAL LAPAROSCOPIC;  Surgeon: [name], MD;  Location: [facility];  Service: Gynecology;  Laterality: N/A;   LAPAROSCOPY  08/19/2010   Procedure: LAPAROSCOPY OPERATIVE;  Surgeon: Felipa Emory;  Location: [facility];  Service: Gynecology;  Laterality: N/A;  with Biopsy of uterine serosa   TUBAL LIGATION  08/2011    MEDS:   Current Outpatient Medications on File Prior to Visit  Medication Sig Dispense Refill   Aspirin-Acetaminophen-Caffeine (EXCEDRIN MIGRAINE PO) Take by mouth. prn     Buprenorphine HCl (BELBUCA) 150 MCG FILM  Place 150 mg inside cheek at bedtime.     buPROPion (WELLBUTRIN SR) 200 MG 12 hr tablet Take 200 mg by mouth 2 (two) times daily.     conjugated estrogens (PREMARIN) vaginal cream 1/2 gram vaginally twice weekly 30 g 1   FLUoxetine (PROZAC) 40 MG capsule Take 40 mg by mouth daily.     hydroxypropyl methylcellulose / hypromellose (ISOPTO TEARS / GONIOVISC) 2.5 % ophthalmic solution Place 1 drop into both eyes 3 (three) times daily as needed for dry eyes.     Insulin Pen Needle (BD PEN NEEDLE NANO 2ND GEN) 32G X 4 MM MISC 1 Package by Does not apply route 2 (two) times daily. 100 each 0   Insulin Pen Needle (BD PEN NEEDLE NANO U/F) 32G X 4 MM MISC Use Nano needle with Saxenda 100 each 0   ketorolac (TORADOL) 10 MG tablet Take 10 mg by mouth as needed.     levothyroxine (SYNTHROID, LEVOTHROID) 125 MCG tablet Take 1 tablet (125 mcg total) by mouth daily. 30 tablet 3   liothyronine (CYTOMEL) 5 MCG tablet Take 3 tablets (15 mcg total) by mouth daily. Take 2 tablets at breakfast and take one tablet at lunch (Patient taking differently: Take 5-10 mcg by mouth See admin instructions. Take 10 mcg by mouth in the morning and 5 mcg in the evening) 270 tablet 1   Liraglutide -Weight Management (SAXENDA) 18 MG/3ML SOPN Inject 3 mg into the skin daily. Increase to 1.5-1.8 daily 15 mL 0   methocarbamol (ROBAXIN) 500 MG tablet Take 500 mg by mouth every 8 (eight) hours as needed for muscle spasms.     NONFORMULARY OR COMPOUNDED ITEM Estradiol cream 0.02% in 66ml prefilled syringes.  37ml pv twice weekly.  #77month supply. (Patient taking differently: Estradiol cream 0.02% in 66ml prefilled syringes.  88ml pv twice weekly.  #22month supply.) 1 each 3   omeprazole (PRILOSEC) 40 MG capsule Take 40 mg by mouth daily.     oxyCODONE ER (XTAMPZA ER) 18 MG C12A Take 18 mg by mouth 2 (two) times daily.     polyethylene glycol (MIRALAX / GLYCOLAX) 17 g packet Take 17 g by mouth daily.     Sennosides (SENOKOT EXTRA STRENGTH) 17.2 MG  TABS Take 17.2 mg by mouth daily.     SUMAtriptan (IMITREX) 25 MG tablet Take 25 mg by mouth at bedtime as needed for migraine.     Vitamin D, Ergocalciferol, (DRISDOL) 1.25 MG (50000 UNIT) CAPS capsule TAKE ONE CAPSULE EVERY 7 DAYS 4 capsule 0   famotidine (PEPCID) 20 MG tablet Take 20 mg by mouth daily as needed for heartburn or indigestion. (Patient not taking: Reported on 01/10/2021)     Testosterone 10 MG/ACT (2%) GEL Apply 1 Pump topically at bedtime.  (Patient not taking: Reported on 01/10/2021)     No current facility-administered medications on file prior to visit.    ALLERGIES: Paroxetine hcl, Cymbalta [duloxetine  hcl], Duloxetine, Percocet [oxycodone-acetaminophen], and Codeine  Family History  Problem Relation Age of Onset   Hypertension Mother    Depression Mother    Anxiety disorder Mother    Obesity Mother    Autoimmune disease Sister    Hypertension Maternal Grandfather    Diabetes Paternal Grandmother    Anesthesia problems Neg Hx     SH:  married, non smoker  Review of Systems  Genitourinary:        Vulvar bump/lesion that is tender   PHYSICAL EXAMINATION:    BP 123/72 (BP Location: Left Arm, Patient Position: Sitting, Cuff Size: Large)    Pulse 79    Ht 5\' 8"  (1.727 m)    Wt 273 lb 9.6 oz (124.1 kg)    LMP 10/26/2015 (Exact Date)    BMI 41.60 kg/m     General appearance: alert, cooperative and appears stated age Lymph:  no inguinal LAD noted  Pelvic: External genitalia:  right labia minor abscess that is fluctuant measuring about 2cm, mild erythema above this noted, tender to palpation              Urethra:  normal appearing urethra with no masses, tenderness or lesions              Bartholins and Skenes: normal     Procedure:  Consent obtained.  Area cleansed with Betadine x 3.  1.5cc 1% lidocaine instilled.  Using #11 blade, lesion opened.  Purulent drainage noted.  Culture obtained.  Lesion milked to ensure this was fully drained.  No dressing applied.   Pt tolerated procedure well.               Chaperone, Octaviano Batty, CMA, was present for exam.  Assessment/Plan: 1. Labial abscess - sulfamethoxazole-trimethoprim (BACTRIM DS) 800-160 MG tablet; Take 1 tablet by mouth 2 (two) times daily.  Dispense: 10 tablet; Refill: 0 - WOUND CULTURE - if heals well, no specific follow up needed.

## 2021-01-16 LAB — WOUND CULTURE: Organism ID, Bacteria: NONE SEEN

## 2021-01-28 ENCOUNTER — Ambulatory Visit (INDEPENDENT_AMBULATORY_CARE_PROVIDER_SITE_OTHER): Payer: Medicare Other | Admitting: Family Medicine

## 2021-01-28 ENCOUNTER — Other Ambulatory Visit: Payer: Self-pay

## 2021-01-28 ENCOUNTER — Encounter (INDEPENDENT_AMBULATORY_CARE_PROVIDER_SITE_OTHER): Payer: Self-pay | Admitting: Family Medicine

## 2021-01-28 VITALS — BP 124/77 | HR 83 | Temp 98.3°F | Ht 68.0 in | Wt 269.0 lb

## 2021-01-28 DIAGNOSIS — F3289 Other specified depressive episodes: Secondary | ICD-10-CM | POA: Diagnosis not present

## 2021-01-28 DIAGNOSIS — Z6841 Body Mass Index (BMI) 40.0 and over, adult: Secondary | ICD-10-CM

## 2021-01-29 NOTE — Progress Notes (Signed)
Chief Complaint:   OBESITY Sally [name] is here to discuss her progress with her obesity treatment plan along with follow-up of her obesity related diagnoses. Sally [name] is on keeping a food journal and adhering to recommended goals of 1300-1600 calories and 85 grams of protein daily and states she is following her eating plan approximately 30% of the time. Sally [name] states she is doing cardio for 25 minutes 7 times per week.  Today's visit was #: 8 Starting weight: 273 lbs Starting date: 08/07/2020 Today's weight: 269 lbs Today's date: 01/28/2021 Total lbs lost to date: 4 Total lbs lost since last in-office visit: 0  Interim History: Sally [name] struggled to stay on track with her eating plan over the holidays, and with her increased stress and grieving. Her insurance is refusing to cover Saxenda due to slow weight loss.  Subjective:   1. Other depression with emotional eating Sally [name] notes increased stress and comfort eating recently and her Prozac was changed to Effexor. She is on Wellbutrin and Topamax.  Assessment/Plan:   1. Other depression with emotional eating Emotional eating behavior strategies were discussed today. We will follow and see how Sally [name] does with her recent medication changes. Orders and follow up as documented in patient record.   2. Obesity with current BMI of 40.9 Sally [name] is currently in the action stage of change. As such, her goal is to continue with weight loss efforts. She has agreed to keeping a food journal and adhering to recommended goals of 1200-1600 calories and 85+ grams of protein daily.   We discussed various medication options to help Sally [name] with her weight loss efforts and we both agreed to discontinue [place] and start Wegovy.  Exercise goals: As is.  Behavioral modification strategies: meal planning and cooking strategies, emotional eating strategies, and keeping a strict food journal.  Sally [name] has agreed to follow-up with our clinic in 3 weeks. She was informed of  the importance of frequent follow-up visits to maximize her success with intensive lifestyle modifications for her multiple health conditions.   Objective:   Blood pressure 124/77, pulse 83, temperature 98.3 F (36.8 C), temperature source Oral, height 5\' 8"  (1.727 m), weight 269 lb (122 kg), last menstrual period 10/26/2015, SpO2 98 %. Body mass index is 40.9 kg/m.  General: Cooperative, alert, well developed, in no acute distress. HEENT: Conjunctivae and lids unremarkable. Cardiovascular: Regular rhythm.  Lungs: Normal work of breathing. Neurologic: No focal deficits.   Lab Results  Component Value Date   CREATININE 0.82 08/07/2020   BUN 12 08/07/2020   NA 141 08/07/2020   K 4.5 08/07/2020   CL 109 (H) 08/07/2020   CO2 19 (L) 08/07/2020   Lab Results  Component Value Date   ALT 16 08/07/2020   AST 16 08/07/2020   ALKPHOS 101 08/07/2020   BILITOT 0.4 08/07/2020   Lab Results  Component Value Date   HGBA1C 5.3 08/07/2020   HGBA1C 5.3 04/03/2015   Lab Results  Component Value Date   INSULIN 4.9 08/07/2020   Lab Results  Component Value Date   TSH 0.05 (L) 08/12/2016   Lab Results  Component Value Date   CHOL 140 09/03/2016   HDL 28.40 (L) 09/03/2016   LDLCALC 93 09/03/2016   TRIG 94.0 09/03/2016   CHOLHDL 5 09/03/2016   Lab Results  Component Value Date   VD25OH 34 01/08/2016   VD25OH 21 (L) 10/19/2015   VD25OH 24 (L) 06/28/2015   Lab Results  Component Value Date  WBC 8.9 10/20/2018   HGB 15.1 (H) 10/20/2018   HCT 44.5 10/20/2018   MCV 98.0 10/20/2018   PLT 194 10/20/2018   Lab Results  Component Value Date   FERRITIN 67 07/27/2014   Attestation Statements:   Reviewed by clinician on day of visit: allergies, medications, problem list, medical history, surgical history, family history, social history, and previous encounter notes.  Time spent on visit including pre-visit chart review and post-visit care and charting was 30 minutes.    I,  Trixie Dredge, am acting as transcriptionist for [name], MD.  I have reviewed the above documentation for accuracy and completeness, and I agree with the above. -  [name], MD

## 2021-02-07 ENCOUNTER — Encounter (INDEPENDENT_AMBULATORY_CARE_PROVIDER_SITE_OTHER): Payer: Self-pay | Admitting: Family Medicine

## 2021-02-18 ENCOUNTER — Encounter (INDEPENDENT_AMBULATORY_CARE_PROVIDER_SITE_OTHER): Payer: Self-pay | Admitting: Family Medicine

## 2021-02-19 MED ORDER — SEMAGLUTIDE-WEIGHT MANAGEMENT 0.25 MG/0.5ML ~~LOC~~ SOAJ: 0.2500 mg | SUBCUTANEOUS | 0 refills | Status: DC

## 2021-02-19 NOTE — Telephone Encounter (Signed)
Dr.Beasley 

## 2021-02-19 NOTE — Addendum Note (Signed)
Addended by: Darral Dash on: 02/19/2021 08:20 AM   Modules accepted: Orders

## 2021-02-26 ENCOUNTER — Other Ambulatory Visit: Payer: Self-pay

## 2021-02-26 ENCOUNTER — Encounter (INDEPENDENT_AMBULATORY_CARE_PROVIDER_SITE_OTHER): Payer: Self-pay | Admitting: Family Medicine

## 2021-02-26 ENCOUNTER — Ambulatory Visit (INDEPENDENT_AMBULATORY_CARE_PROVIDER_SITE_OTHER): Payer: Medicare Other | Admitting: Family Medicine

## 2021-02-26 VITALS — BP 107/73 | HR 82 | Temp 98.2°F | Ht 68.0 in | Wt 278.0 lb

## 2021-02-26 DIAGNOSIS — E669 Obesity, unspecified: Secondary | ICD-10-CM

## 2021-02-26 DIAGNOSIS — Z6841 Body Mass Index (BMI) 40.0 and over, adult: Secondary | ICD-10-CM | POA: Diagnosis not present

## 2021-02-26 DIAGNOSIS — F418 Other specified anxiety disorders: Secondary | ICD-10-CM

## 2021-02-26 DIAGNOSIS — E559 Vitamin D deficiency, unspecified: Secondary | ICD-10-CM

## 2021-02-27 NOTE — Progress Notes (Signed)
Chief Complaint:   OBESITY Sally [name] is here to discuss her progress with her obesity treatment plan along with follow-up of her obesity related diagnoses. Sally [name] is on keeping a food journal and adhering to recommended goals of 1200-1600 calories and 85+ grams of protein daily and states she is following her eating plan approximately 0% of the time. Sally [name] states she is doing 0 minutes 0 times per week.  Today's visit was #: 9 Starting weight: 273 lbs Starting date: 08/07/2020 Today's weight: 278 lbs Today's date: 02/26/2021 Total lbs lost to date: 0 Total lbs lost since last in-office visit: 0  Interim History: Sally [name] has struggled more with stress and comfort eating. She changed from [place] to [place], but it was delayed due to prior authorization issues.  Subjective:   1. Vitamin D deficiency Sally [name] is on Vit D weekly prescription. Her recent Vit D level done at her primary care provider's office was 45 (no results in Epic).  2. Depression with anxiety Sally [name] started Effexor recently and she feels it may be increasing her hunger and cravings. She stopped her GLP-1 at the same time, which may have contributed.   Assessment/Plan:   1. Vitamin D deficiency Sally [name] will continue prescription Vitamin D as is, and she will follow-up for routine testing of Vitamin D, at least 2-3 times per year to avoid over-replacement.  2. Depression with anxiety Sally [name] will continue Effexor and topiramate for now. Behavior modification techniques were discussed today to help Sally [name] deal with her emotional/non-hunger eating behaviors. Orders and follow up as documented in patient record.   3. Obesity with current BMI of 42.3 Sally [name] is currently in the action stage of change. As such, her goal is to continue with weight loss efforts. She has agreed to keeping a food journal and adhering to recommended goals of 1300-1600 calories and 85+ grams of protein daily.   We discussed various medication options to help  Sally [name] with her weight loss efforts and we both agreed to continue Wegovy at her current dose, may increase at her next visit. We will continue to actively mange this medication.  Behavioral modification strategies: increasing lean protein intake and no skipping meals.  Sally [name] has agreed to follow-up with our clinic in 2 to 3 weeks. She was informed of the importance of frequent follow-up visits to maximize her success with intensive lifestyle modifications for her multiple health conditions.   Objective:   Blood pressure 107/73, pulse 82, temperature 98.2 F (36.8 C), height 5\' 8"  (1.727 m), weight 278 lb (126.1 kg), last menstrual period 10/26/2015, SpO2 100 %. Body mass index is 42.27 kg/m.  General: Cooperative, alert, well developed, in no acute distress. HEENT: Conjunctivae and lids unremarkable. Cardiovascular: Regular rhythm.  Lungs: Normal work of breathing. Neurologic: No focal deficits.   Lab Results  Component Value Date   CREATININE 0.82 08/07/2020   BUN 12 08/07/2020   NA 141 08/07/2020   K 4.5 08/07/2020   CL 109 (H) 08/07/2020   CO2 19 (L) 08/07/2020   Lab Results  Component Value Date   ALT 16 08/07/2020   AST 16 08/07/2020   ALKPHOS 101 08/07/2020   BILITOT 0.4 08/07/2020   Lab Results  Component Value Date   HGBA1C 5.3 08/07/2020   HGBA1C 5.3 04/03/2015   Lab Results  Component Value Date   INSULIN 4.9 08/07/2020   Lab Results  Component Value Date   TSH 0.05 (L) 08/12/2016   Lab Results  Component Value Date  CHOL 140 09/03/2016   HDL 28.40 (L) 09/03/2016   LDLCALC 93 09/03/2016   TRIG 94.0 09/03/2016   CHOLHDL 5 09/03/2016   Lab Results  Component Value Date   VD25OH 34 01/08/2016   VD25OH 21 (L) 10/19/2015   VD25OH 24 (L) 06/28/2015   Lab Results  Component Value Date   WBC 8.9 10/20/2018   HGB 15.1 (H) 10/20/2018   HCT 44.5 10/20/2018   MCV 98.0 10/20/2018   PLT 194 10/20/2018   Lab Results  Component Value Date   FERRITIN  67 07/27/2014    Obesity Behavioral Intervention:   Approximately 15 minutes were spent on the discussion below.  ASK: We discussed the diagnosis of obesity with Sally [name] today and Sally [name] agreed to give [place] permission to discuss obesity behavioral modification therapy today.  ASSESS: Honest has the diagnosis of obesity and her BMI today is 42.3. Sally [name] is in the action stage of change.   ADVISE: Sally [name] was educated on the multiple health risks of obesity as well as the benefit of weight loss to improve her health. She was advised of the need for long term treatment and the importance of lifestyle modifications to improve her current health and to decrease her risk of future health problems.  AGREE: Multiple dietary modification options and treatment options were discussed and Sally [name] agreed to follow the recommendations documented in the above note.  ARRANGE: Sally [name] was educated on the importance of frequent visits to treat obesity as outlined per CMS and USPSTF guidelines and agreed to schedule her next follow up appointment today.  Attestation Statements:   Reviewed by clinician on day of visit: allergies, medications, problem list, medical history, surgical history, family history, social history, and previous encounter notes.   I, Trixie Dredge, am acting as transcriptionist for [name], MD.  I have reviewed the above documentation for accuracy and completeness, and I agree with the above. -  [name], MD

## 2021-03-19 ENCOUNTER — Other Ambulatory Visit: Payer: Self-pay

## 2021-03-19 ENCOUNTER — Encounter (INDEPENDENT_AMBULATORY_CARE_PROVIDER_SITE_OTHER): Payer: Self-pay | Admitting: Family Medicine

## 2021-03-19 ENCOUNTER — Ambulatory Visit (INDEPENDENT_AMBULATORY_CARE_PROVIDER_SITE_OTHER): Payer: Medicare Other | Admitting: Family Medicine

## 2021-03-19 VITALS — BP 110/71 | HR 100 | Temp 98.1°F | Ht 68.0 in | Wt 274.0 lb

## 2021-03-19 DIAGNOSIS — Z6841 Body Mass Index (BMI) 40.0 and over, adult: Secondary | ICD-10-CM

## 2021-03-19 DIAGNOSIS — E669 Obesity, unspecified: Secondary | ICD-10-CM | POA: Diagnosis not present

## 2021-03-19 DIAGNOSIS — E559 Vitamin D deficiency, unspecified: Secondary | ICD-10-CM

## 2021-03-19 MED ORDER — SEMAGLUTIDE-WEIGHT MANAGEMENT 0.25 MG/0.5ML ~~LOC~~ SOAJ: 0.2500 mg | SUBCUTANEOUS | 0 refills | Status: DC

## 2021-03-19 MED ORDER — VITAMIN D (ERGOCALCIFEROL) 1.25 MG (50000 UNIT) PO CAPS: ORAL_CAPSULE | ORAL | 0 refills | Status: DC

## 2021-03-19 NOTE — Progress Notes (Signed)
Chief Complaint:   OBESITY Sally [name] is here to discuss her progress with her obesity treatment plan along with follow-up of her obesity related diagnoses. Sally [name] is on keeping a food journal and adhering to recommended goals of 1300-1600 calories and 85+ grams of protein daily and states she is following her eating plan approximately 40% of the time. Sally [name] states she is at the gym for 60 minutes 7 times per week.  Today's visit was #: 10 Starting weight: 273 lbs Starting date: 08/07/2020 Today's weight: 274 lbs Today's date: 03/19/2021 Total lbs lost to date: 0 Total lbs lost since last in-office visit: 4  Interim History: Sally [name] is doing well with weight loss. She notes some episodes of feeling hypoglycemic (no blood sugar check). She is exercising regularly now. She may not be drinking enough water, and still working on meeting her protein goals.  Subjective:   1. Vitamin D deficiency Sally [name] is stable on Vitamin D prescription, but her level is not yet at goal. No side effects were noted.  Assessment/Plan:   1. Vitamin D deficiency We will refill prescription Vitamin D 50,000 IU every week #4 for 1 month. Sally [name] will follow-up for routine testing of Vitamin D, at least 2-3 times per year to avoid over-replacement.  - Vitamin D, Ergocalciferol, (DRISDOL) 1.25 MG (50000 UNIT) CAPS capsule; TAKE 1 CAPSULE BY MOUTH EVERY 7 DAYS  Dispense: 4 capsule; Refill: 0  2. Obesity with current BMI of 41.7 Sally [name] is currently in the action stage of change. As such, her goal is to continue with weight loss efforts. She has agreed to keeping a food journal and adhering to recommended goals of 1200-1600 calories and 85+ grams of protein daily.   We discussed various medication options to help Sally [name] with her weight loss efforts and we both agreed to continue Sally [name], and we will refill for 1 month.  - Semaglutide-Weight Management 0.25 MG/0.5 ML SOAJ; Inject 0.25 mg into the skin once a week.  Dispense:  2 mL; Refill: 0  Exercise goals: As is.   Behavioral modification strategies: increasing lean protein intake and increasing water intake.  Sally [name] has agreed to follow-up with our clinic in 3 weeks. She was informed of the importance of frequent follow-up visits to maximize her success with intensive lifestyle modifications for her multiple health conditions.   Objective:   Blood pressure 110/71, pulse 100, temperature 98.1 F (36.7 C), height 5\' 8"  (1.727 m), weight 274 lb (124.3 kg), last menstrual period 10/26/2015, SpO2 97 %. Body mass index is 41.66 kg/m.  General: Cooperative, alert, well developed, in no acute distress. HEENT: Conjunctivae and lids unremarkable. Cardiovascular: Regular rhythm.  Lungs: Normal work of breathing. Neurologic: No focal deficits.   Lab Results  Component Value Date   CREATININE 0.82 08/07/2020   BUN 12 08/07/2020   NA 141 08/07/2020   K 4.5 08/07/2020   CL 109 (H) 08/07/2020   CO2 19 (L) 08/07/2020   Lab Results  Component Value Date   ALT 16 08/07/2020   AST 16 08/07/2020   ALKPHOS 101 08/07/2020   BILITOT 0.4 08/07/2020   Lab Results  Component Value Date   HGBA1C 5.3 08/07/2020   HGBA1C 5.3 04/03/2015   Lab Results  Component Value Date   INSULIN 4.9 08/07/2020   Lab Results  Component Value Date   TSH 0.05 (L) 08/12/2016   Lab Results  Component Value Date   CHOL 140 09/03/2016   HDL 28.40 (L) 09/03/2016  LDLCALC 93 09/03/2016   TRIG 94.0 09/03/2016   CHOLHDL 5 09/03/2016   Lab Results  Component Value Date   VD25OH 34 01/08/2016   VD25OH 21 (L) 10/19/2015   VD25OH 24 (L) 06/28/2015   Lab Results  Component Value Date   WBC 8.9 10/20/2018   HGB 15.1 (H) 10/20/2018   HCT 44.5 10/20/2018   MCV 98.0 10/20/2018   PLT 194 10/20/2018   Lab Results  Component Value Date   FERRITIN 67 07/27/2014    Obesity Behavioral Intervention:   Approximately 15 minutes were spent on the discussion below.  ASK: We  discussed the diagnosis of obesity with Breigh today and Neeka agreed to give [place] permission to discuss obesity behavioral modification therapy today.  ASSESS: Nesha has the diagnosis of obesity and her BMI today is 41.7. Nya is in the action stage of change.   ADVISE: Amyiah was educated on the multiple health risks of obesity as well as the benefit of weight loss to improve her health. She was advised of the need for long term treatment and the importance of lifestyle modifications to improve her current health and to decrease her risk of future health problems.  AGREE: Multiple dietary modification options and treatment options were discussed and Gelila agreed to follow the recommendations documented in the above note.  ARRANGE: Itzel was educated on the importance of frequent visits to treat obesity as outlined per CMS and USPSTF guidelines and agreed to schedule her next follow up appointment today.  Attestation Statements:   Reviewed by clinician on day of visit: allergies, medications, problem list, medical history, surgical history, family history, social history, and previous encounter notes.   I, Trixie Dredge, am acting as transcriptionist for [name], MD.  I have reviewed the above documentation for accuracy and completeness, and I agree with the above. -  [name], MD

## 2021-04-03 ENCOUNTER — Encounter (HOSPITAL_BASED_OUTPATIENT_CLINIC_OR_DEPARTMENT_OTHER): Payer: Self-pay | Admitting: Obstetrics & Gynecology

## 2021-04-04 ENCOUNTER — Encounter: Payer: No Typology Code available for payment source | Admitting: Psychology

## 2021-04-09 ENCOUNTER — Ambulatory Visit (INDEPENDENT_AMBULATORY_CARE_PROVIDER_SITE_OTHER): Payer: Medicare Other | Admitting: Family Medicine

## 2021-04-09 ENCOUNTER — Other Ambulatory Visit: Payer: Self-pay

## 2021-04-09 ENCOUNTER — Encounter (INDEPENDENT_AMBULATORY_CARE_PROVIDER_SITE_OTHER): Payer: Self-pay | Admitting: Family Medicine

## 2021-04-09 VITALS — BP 116/75 | HR 83 | Temp 98.0°F | Ht 68.0 in | Wt 276.0 lb

## 2021-04-09 DIAGNOSIS — E669 Obesity, unspecified: Secondary | ICD-10-CM

## 2021-04-09 DIAGNOSIS — E559 Vitamin D deficiency, unspecified: Secondary | ICD-10-CM

## 2021-04-09 DIAGNOSIS — Z6841 Body Mass Index (BMI) 40.0 and over, adult: Secondary | ICD-10-CM

## 2021-04-09 MED ORDER — SEMAGLUTIDE-WEIGHT MANAGEMENT 0.5 MG/0.5ML ~~LOC~~ SOAJ: 0.5000 mg | SUBCUTANEOUS | 0 refills | Status: DC

## 2021-04-11 ENCOUNTER — Encounter (INDEPENDENT_AMBULATORY_CARE_PROVIDER_SITE_OTHER): Payer: Self-pay | Admitting: Family Medicine

## 2021-04-11 ENCOUNTER — Other Ambulatory Visit (INDEPENDENT_AMBULATORY_CARE_PROVIDER_SITE_OTHER): Payer: Self-pay | Admitting: Family Medicine

## 2021-04-11 NOTE — Telephone Encounter (Signed)
Last Ov with Dr Leafy Ro

## 2021-04-12 NOTE — Progress Notes (Signed)
? ? ? ?Chief Complaint:  ? ?OBESITY ?Sally [name] is here to discuss her progress with her obesity treatment plan along with follow-up of her obesity related diagnoses. Sally [name] is on keeping a food journal and adhering to recommended goals of 1200-1600 calories and 85+ grams of protein daily and states she is following her eating plan approximately 70% of the time. Sally [name] states she is at the gym for 60 minutes 6 times per week. ? ?Today's visit was #: 40 ?Starting weight: 273 lbs ?Starting date: 08/07/2020 ?Today's weight: 276 lbs ?Today's date: 04/09/2021 ?Total lbs lost to date: 0 ?Total lbs lost since last in-office visit: 0 ? ?Interim History: Sally [name] is working on increasing her exercise and being mindful of her food choices. She is keeping track of calories in her head, but she may not be meeting her protein goals. ? ?Subjective:  ? ?1. Vitamin D deficiency ?Sally [name] is on prescription Vit D, and she is due for labs soon. ? ?Assessment/Plan:  ? ?1. Vitamin D deficiency ?Sally [name] will continue prescription Vitamin D 50,000 IU every week, and we will recheck labs in 2-3 weeks. She will follow-up for routine testing of Vitamin D, at least 2-3 times per year to avoid over-replacement. ? ?2. Obesity with current BMI of 42.0 ?Sally [name] is currently in the action stage of change. As such, her goal is to continue with weight loss efforts. She has agreed to keeping a food journal and adhering to recommended goals of 1300-1600 calories and 85+ grams of protein daily.  ? ?We discussed various medication options to help Sally [name] with her weight loss efforts and we both agreed to increase Wegovy from 0.25 to 0.5 mg q week, with no refills. ? ?- Semaglutide-Weight Management 0.5 MG/0.5ML SOAJ; Inject 0.5 mg into the skin once a week.  Dispense: 2 mL; Refill: 0 ? ?We will recheck fasting labs at her next visit. ? ?Exercise goals: As is. ? ?Behavioral modification strategies: increasing lean protein intake and keeping a strict food journal. ? ?Sally [name]  has agreed to follow-up with our clinic in 2 to 3 weeks. She was informed of the importance of frequent follow-up visits to maximize her success with intensive lifestyle modifications for her multiple health conditions.  ? ?Objective:  ? ?Blood pressure 116/75, pulse 83, temperature 98 ?F (36.7 ?C), height '5\' 8"'$  (1.727 m), weight 276 lb (125.2 kg), last menstrual period 10/26/2015, SpO2 96 %. ?Body mass index is 41.97 kg/m?. ? ?General: Cooperative, alert, well developed, in no acute distress. ?HEENT: Conjunctivae and lids unremarkable. ?Cardiovascular: Regular rhythm.  ?Lungs: Normal work of breathing. ?Neurologic: No focal deficits.  ? ?Lab Results  ?Component Value Date  ? CREATININE 0.82 08/07/2020  ? BUN 12 08/07/2020  ? NA 141 08/07/2020  ? K 4.5 08/07/2020  ? CL 109 (H) 08/07/2020  ? CO2 19 (L) 08/07/2020  ? ?Lab Results  ?Component Value Date  ? ALT 16 08/07/2020  ? AST 16 08/07/2020  ? ALKPHOS 101 08/07/2020  ? BILITOT 0.4 08/07/2020  ? ?Lab Results  ?Component Value Date  ? HGBA1C 5.3 08/07/2020  ? HGBA1C 5.3 04/03/2015  ? ?Lab Results  ?Component Value Date  ? INSULIN 4.9 08/07/2020  ? ?Lab Results  ?Component Value Date  ? TSH 0.05 (L) 08/12/2016  ? ?Lab Results  ?Component Value Date  ? CHOL 140 09/03/2016  ? HDL 28.40 (L) 09/03/2016  ? [place] 93 09/03/2016  ? TRIG 94.0 09/03/2016  ? CHOLHDL 5 09/03/2016  ? ?Lab Results  ?Component  Value Date  ? VD25OH 34 01/08/2016  ? VD25OH 21 (L) 10/19/2015  ? VD25OH 24 (L) 06/28/2015  ? ?Lab Results  ?Component Value Date  ? WBC 8.9 10/20/2018  ? HGB 15.1 (H) 10/20/2018  ? HCT 44.5 10/20/2018  ? MCV 98.0 10/20/2018  ? PLT 194 10/20/2018  ? ?Lab Results  ?Component Value Date  ? FERRITIN 67 07/27/2014  ? ?Attestation Statements:  ? ?Reviewed by clinician on day of visit: allergies, medications, problem list, medical history, surgical history, family history, social history, and previous encounter notes. ? ?Time spent on visit including pre-visit chart review and  post-visit care and charting was 30 minutes.  ? ? ?I, Trixie Dredge, am acting as transcriptionist for [name], MD. ? ?I have reviewed the above documentation for accuracy and completeness, and I agree with the above. -  [name], MD ? ? ?

## 2021-04-13 ENCOUNTER — Encounter (HOSPITAL_BASED_OUTPATIENT_CLINIC_OR_DEPARTMENT_OTHER): Payer: Self-pay

## 2021-04-13 ENCOUNTER — Emergency Department (HOSPITAL_BASED_OUTPATIENT_CLINIC_OR_DEPARTMENT_OTHER): Payer: Medicare Other

## 2021-04-13 ENCOUNTER — Emergency Department (HOSPITAL_BASED_OUTPATIENT_CLINIC_OR_DEPARTMENT_OTHER)
Admission: EM | Admit: 2021-04-13 | Discharge: 2021-04-13 | Disposition: A | Discharge status: Home / Self Care | Payer: Medicare Other | Attending: Emergency Medicine | Admitting: Emergency Medicine

## 2021-04-13 ENCOUNTER — Other Ambulatory Visit: Payer: Self-pay

## 2021-04-13 DIAGNOSIS — M25561 Pain in right knee: Secondary | ICD-10-CM

## 2021-04-13 DIAGNOSIS — T148XXA Other injury of unspecified body region, initial encounter: Secondary | ICD-10-CM

## 2021-04-13 DIAGNOSIS — Y831 Surgical operation with implant of artificial internal device as the cause of abnormal reaction of the patient, or of later complication, without mention of misadventure at the time of the procedure: Secondary | ICD-10-CM | POA: Diagnosis not present

## 2021-04-13 DIAGNOSIS — L7632 Postprocedural hematoma of skin and subcutaneous tissue following other procedure: Secondary | ICD-10-CM | POA: Diagnosis not present

## 2021-04-13 MED ORDER — IBUPROFEN 800 MG PO TABS
800.0000 mg | ORAL_TABLET | Freq: Once | ORAL | Status: AC
  Administered 2021-04-13: 800 mg via ORAL
  Filled 2021-04-13: qty 1

## 2021-04-13 MED ORDER — FENTANYL CITRATE PF 50 MCG/ML IJ SOSY
50.0000 ug | PREFILLED_SYRINGE | Freq: Once | INTRAMUSCULAR | Status: AC
  Administered 2021-04-13: 50 ug via INTRAMUSCULAR
  Filled 2021-04-13: qty 1

## 2021-04-13 NOTE — ED Triage Notes (Signed)
Pt arrives with c/o right knee pain that started about a week ago. Per pt, she has been walking a lot today and the pain and swelling started to get worse today. Pt does having some bruising to knee, but denies any injury.  ?

## 2021-04-13 NOTE — ED Provider Notes (Signed)
?Sally [name] ?Provider Note ? ? ?CSN: [id number] ?Arrival date & time: 04/13/21  2032 ? ?  ? ?History ? ?Chief Complaint  ?Patient presents with  ? Knee Pain  ? ? ?Sally [name] is a 46 y.o. female. ? 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 Patient admits to worsening pain from baseline in right knee. Admits to new bruise and swelling just distal to the knee, motions towards anterior side. Denies any fall or known injuries. Concerns for DVT.  ? ?The history is provided by the patient. No language interpreter was used.  ?Knee Pain ?Associated symptoms: no fever   ? ?  ? ?Home Medications ?Prior to Admission medications   ?Medication Sig Start Date End Date Taking? Authorizing Provider  ?Aspirin-Acetaminophen-Caffeine (EXCEDRIN MIGRAINE PO) Take by mouth. prn    [provider]  ?Buprenorphine HCl (BELBUCA) 150 MCG FILM Place 150 mg inside cheek at bedtime.    [provider]  ?buPROPion (WELLBUTRIN SR) 200 MG 12 hr tablet Take 200 mg by mouth 2 (two) times daily.    [provider]  ?conjugated estrogens (PREMARIN) vaginal cream 1/2 gram vaginally twice weekly 09/13/18   Sally Salon, MD  ?hydroxypropyl methylcellulose / hypromellose (ISOPTO TEARS / GONIOVISC) 2.5 % ophthalmic solution Place 1 drop into both eyes 3 (three) times daily as needed for dry eyes.    [provider]  ?ketorolac (TORADOL) 10 MG tablet Take 10 mg by mouth as needed.    [provider]  ?levothyroxine (SYNTHROID) 175 MCG tablet Take 175 mcg by mouth daily. 02/19/21   [provider]  ?liothyronine (CYTOMEL) 5 MCG tablet Take 3 tablets (15 mcg total) by mouth daily. Take 2 tablets at breakfast and take one tablet at lunch ?Patient taking differently: Take 5-10 mcg by mouth See admin instructions. Take 10 mcg by mouth in the morning and 5 mcg in  the evening 09/17/16   Elayne Snare, MD  ?methocarbamol (ROBAXIN) 500 MG tablet Take 500 mg by mouth every 8 (eight) hours as needed for muscle spasms.    [provider]  ?NONFORMULARY OR COMPOUNDED ITEM Estradiol cream 0.02% in 58m prefilled syringes.  17mpv twice weekly.  #[id number]. ?Patient taking differently: Estradiol cream 0.02% in 1ml15mefilled syringes.  1ml 45mtwice weekly.  #[id number]. 09/29/18   MillerMegan [name]?omeprazole (PRILOSEC) 40 MG capsule Take 40 mg by mouth daily.    [provider]  ?oxyCODONE ER (XTAMPZA ER) 18 MG C12A Take 10 mg by mouth every 6 (six) hours as needed.    [provider]  ?polyethylene glycol (MIRALAX / GLYCOLAX) 17 g packet Take 17 g by mouth daily.    [provider]  ?Semaglutide-Weight Management 0.5 MG/0.5ML SOAJ Inject 0.5 mg into the skin once a week. 04/09/21   Sally [name]  ?Sennosides ([facility] STRENGTH) 17.2 MG TABS Take 17.2 mg by mouth daily.    [provider]  ?SUMAtriptan (IMITREX) 25 MG tablet Take 25 mg by mouth at bedtime as needed for migraine. 08/09/18   [provider]  ?Testosterone 10 MG/ACT (2%) GEL Apply 1 Pump topically at bedtime. 09/06/18   [provider]  ?venlafaxine XR (EFFEXOR-XR) 75 MG 24 hr capsule Take 75 mg by mouth daily with breakfast.    [provider]  ?Vitamin D, Ergocalciferol, (DRISDOL) 1.25  MG (50000 UNIT) CAPS capsule TAKE ONE CAPSULE EVERY 7 DAYS 03/19/21   Sally Skeans, MD  ?   ? ?Allergies    ?Paroxetine hcl, Cymbalta [duloxetine hcl], Duloxetine, Percocet [oxycodone-acetaminophen], and Codeine   ? ?Review of Systems   ?Review of Systems  ?Constitutional:  Negative for chills and fever.  ?Musculoskeletal:  Negative for gait problem.  ?Skin:  Positive for color change. Negative for wound.  ?Neurological:  Negative for weakness and numbness.  ? ?Physical Exam ?Updated Vital Signs ?BP (!) 153/99 (BP Location: Right Arm)   Pulse 87    Temp 98.8 ?F (37.1 ?C)   Resp 16   Ht '5\' 8"'$  (1.727 m)   Wt 124.7 kg   LMP 10/26/2015 (Exact Date)   SpO2 100%   BMI 41.81 kg/m?  ?Physical Exam ?Vitals and nursing note reviewed.  ?Constitutional:   ?   Appearance: Normal appearance.  ?HENT:  ?   Head: Normocephalic and atraumatic.  ?Cardiovascular:  ?   Rate and Rhythm: Normal rate and regular rhythm.  ?   Pulses:     ?     Dorsalis pedis pulses are 2+ on the right side and 2+ on the left side.  ?Pulmonary:  ?   Effort: Pulmonary effort is normal. No respiratory distress.  ?Musculoskeletal:  ?   Right knee: Ecchymosis present. Tenderness present.  ?   Left knee: Normal.  ?     Legs: ? ?Skin: ?   Capillary Refill: Capillary refill takes less than 2 seconds.  ?Neurological:  ?   General: No focal deficit present.  ?   Mental Status: She is alert and oriented to person, place, and time.  ?   GCS: GCS eye subscore is 4. GCS verbal subscore is 5. GCS motor subscore is 6.  ?   Sensory: Sensation is intact.  ?   Motor: Motor function is intact.  ? ? ?ED Results / Procedures / Treatments   ?Labs ?(all labs ordered are listed, but only abnormal results are displayed) ?Labs Reviewed - No data to display ? ?EKG ?None ? ?Radiology ?No results found. ? ?Procedures ?Procedures  ? ? ?Medications Ordered in ED ?Medications  ?ibuprofen (ADVIL) tablet 800 mg (has no administration in time range)  ? ? ?ED Course/ Medical Decision Making/ A&P ?  ?                        ?Medical Decision Making ?Amount and/or Complexity of Data Reviewed ?Radiology: ordered. ? ?Risk ?Prescription drug management. ? ? ?9:16 PM ?46 yo female with PMH of chronic regional pain syndrome surrounding right knee after right knee arthroplasty presenting for right knee pain and bruising. Patient is Aox3, no acute distress, afebrile, with stable vitals. Physical exam demonstrates ecchymosis and hematoma to right anterior lower extremity just distal to the patella. Otherwise no bony tenderness. Pt offered  and declined Xray due to no hx of injury or bony pain at this time. Pt recommended for rest, elevation, ice, and motrin/tylenol given for pain.  ? ?Patient in no distress and overall condition improved here in the ED. Detailed discussions were had with the patient regarding current findings, and need for close f/u with PCP or on call doctor. The patient has been instructed to return immediately if the symptoms worsen in any way for re-evaluation. Patient verbalized understanding and is in agreement with current care plan. All questions answered prior to discharge. ? ? ? ? ? ? ? ? ?  Final Clinical Impression(s) / ED Diagnoses ?Final diagnoses:  ?Acute pain of right knee  ?Hematoma  ? ? ?Rx / DC Orders ?ED Discharge Orders   ? ? None  ? ?  ? ? ?  ?Lianne Cure, DO ?10/09/78 1055 ? ?

## 2021-04-25 ENCOUNTER — Ambulatory Visit (INDEPENDENT_AMBULATORY_CARE_PROVIDER_SITE_OTHER): Payer: Medicare Other | Admitting: Nurse Practitioner

## 2021-04-29 ENCOUNTER — Ambulatory Visit (INDEPENDENT_AMBULATORY_CARE_PROVIDER_SITE_OTHER): Payer: Medicare Other | Admitting: Family Medicine

## 2021-05-02 ENCOUNTER — Encounter: Payer: No Typology Code available for payment source | Attending: Psychology | Admitting: Psychology

## 2021-05-02 DIAGNOSIS — F419 Anxiety disorder, unspecified: Secondary | ICD-10-CM | POA: Diagnosis present

## 2021-05-02 DIAGNOSIS — F32A Depression, unspecified: Secondary | ICD-10-CM | POA: Diagnosis present

## 2021-05-02 DIAGNOSIS — M797 Fibromyalgia: Secondary | ICD-10-CM | POA: Insufficient documentation

## 2021-05-02 DIAGNOSIS — M5020 Other cervical disc displacement, unspecified cervical region: Secondary | ICD-10-CM | POA: Diagnosis present

## 2021-05-02 DIAGNOSIS — G894 Chronic pain syndrome: Secondary | ICD-10-CM | POA: Insufficient documentation

## 2021-05-02 DIAGNOSIS — G90513 Complex regional pain syndrome I of upper limb, bilateral: Secondary | ICD-10-CM | POA: Diagnosis present

## 2021-05-06 ENCOUNTER — Encounter (INDEPENDENT_AMBULATORY_CARE_PROVIDER_SITE_OTHER): Payer: Self-pay | Admitting: Family Medicine

## 2021-05-06 ENCOUNTER — Ambulatory Visit (INDEPENDENT_AMBULATORY_CARE_PROVIDER_SITE_OTHER): Payer: Medicare Other | Admitting: Family Medicine

## 2021-05-06 VITALS — BP 103/50 | HR 98 | Temp 97.8°F | Ht 68.0 in | Wt 273.0 lb

## 2021-05-06 DIAGNOSIS — E88819 Insulin resistance, unspecified: Secondary | ICD-10-CM

## 2021-05-06 DIAGNOSIS — E559 Vitamin D deficiency, unspecified: Secondary | ICD-10-CM | POA: Diagnosis not present

## 2021-05-06 DIAGNOSIS — E8881 Metabolic syndrome: Secondary | ICD-10-CM | POA: Diagnosis not present

## 2021-05-06 DIAGNOSIS — E038 Other specified hypothyroidism: Secondary | ICD-10-CM

## 2021-05-06 DIAGNOSIS — Z6841 Body Mass Index (BMI) 40.0 and over, adult: Secondary | ICD-10-CM

## 2021-05-06 DIAGNOSIS — E669 Obesity, unspecified: Secondary | ICD-10-CM | POA: Diagnosis not present

## 2021-05-06 DIAGNOSIS — E039 Hypothyroidism, unspecified: Secondary | ICD-10-CM | POA: Diagnosis not present

## 2021-05-06 MED ORDER — WEGOVY 1 MG/0.5ML ~~LOC~~ SOAJ: 1.0000 mg | SUBCUTANEOUS | 0 refills | Status: DC

## 2021-05-07 LAB — LIPID PANEL WITH LDL/HDL RATIO
Cholesterol, Total: 151 mg/dL (ref 100–199)
HDL: 44 mg/dL (ref >39)
LDL Chol Calc (NIH): 92 mg/dL (ref 0–99)
LDL/HDL Ratio: 2.1 ratio (ref 0.0–3.2)
Triglycerides: 75 mg/dL (ref 0–149)
VLDL Cholesterol Cal: 15 mg/dL (ref 5–40)

## 2021-05-07 LAB — CMP14+EGFR
ALT: 17 IU/L (ref 0–32)
AST: 17 IU/L (ref 0–40)
Albumin/Globulin Ratio: 1.6 (ref 1.2–2.2)
Albumin: 3.9 g/dL (ref 3.8–4.8)
Alkaline Phosphatase: 92 IU/L (ref 44–121)
BUN/Creatinine Ratio: 18 (ref 9–23)
BUN: 13 mg/dL (ref 6–24)
Bilirubin Total: 0.2 mg/dL (ref 0.0–1.2)
CO2: 24 mmol/L (ref 20–29)
Calcium: 9.3 mg/dL (ref 8.7–10.2)
Chloride: 106 mmol/L (ref 96–106)
Creatinine, Ser: 0.72 mg/dL (ref 0.57–1.00)
Globulin, Total: 2.5 g/dL (ref 1.5–4.5)
Glucose: 79 mg/dL (ref 70–99)
Potassium: 4.6 mmol/L (ref 3.5–5.2)
Sodium: 141 mmol/L (ref 134–144)
Total Protein: 6.4 g/dL (ref 6.0–8.5)
eGFR: 104 mL/min/{1.73_m2} (ref >59)

## 2021-05-07 LAB — VITAMIN D 25 HYDROXY (VIT D DEFICIENCY, FRACTURES): Vit D, 25-Hydroxy: 45.4 ng/mL (ref 30.0–100.0)

## 2021-05-07 LAB — INSULIN, RANDOM: INSULIN: 4.2 u[IU]/mL (ref 2.6–24.9)

## 2021-05-07 LAB — VITAMIN B12: Vitamin B-12: 735 pg/mL (ref 232–1245)

## 2021-05-07 LAB — HEMOGLOBIN A1C
Est. average glucose Bld gHb Est-mCnc: 111 mg/dL
Hgb A1c MFr Bld: 5.5 % (ref 4.8–5.6)

## 2021-05-07 LAB — TSH: TSH: 0.01 u[IU]/mL — ABNORMAL LOW (ref 0.450–4.500)

## 2021-05-16 ENCOUNTER — Encounter: Payer: Self-pay | Admitting: Psychology

## 2021-05-16 NOTE — Progress Notes (Signed)
Patient:  Sally [name]  ? ?DOB: May 25, 1975 ? ?MR Number: [id number] ? ?Location: [facility] ?[place], [address] [place] ?[id number] ?[place] [address] ?Dept: [phone] ? ?Start: 8 AM ?End: 9 AM ? ?Today's visit was an in person visit is conducted in my outpatient clinic office with the patient and myself present in the office. ? ? ?Provider/Observer:     Edgardo Roys PsyD ? ?Chief Complaint:      ?Chief Complaint  ?Patient presents with  ? Anxiety  ? Depression  ? Pain  ? Neck Pain  ? ? ?Reason For Service:     Cherly Erno is a 46 year old female referred by [facility] for therapeutic interventions due to chronic pain symptoms.  The patient reports that she has great difficulty standing, walking for long times, elbow pain, migraines, hip pain, shoulder and neck pain.  The patient fell at work and fractured both her elbows and also had knee replacement surgery.  The patient has been taking maintenance doses of opioid pain medications for some time.  The patient reports that she has a great deal of difficulty functioning and has very poor sleep and wakes up numerous times each night.  The patient also has dealt with depression and anxiety for many years and has been hospitalized for depression anxiety years ago.  She has been receiving counseling for anxiety and depression and takes Trintellix. ?  ?The patient reports that she fell in 2015 and injured her right knee and fractured both of her radial heads in her elbows and developed significant fibromyalgia versus regional pain syndrome.  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 ?  ?The patient describes chronic issues of depression anxiety but the development of severe chronic pain. ? ?The patient reports that she is  continuing to have some pain in her back post surgery and had a recent fall.  Follow-up MRI did not suggest any structural damage or injury from this fall.  The patient reports that her symptoms associated with complex regional pain syndrome have worsened recently. ? ?11/01/2019: The patient is continued to have significant pain and distress with worsening episodes of her complex regional pain syndrome and fibromyalgia type symptoms.  The patient has been very frustrated at times with her lack of ability to function and do daily activities. ? ?01/03/2020: The patient reports that she is continued to have significant pain but has particularly had worsening of her fatigue and somnolence.  She reports that she feels like she is sleeping an adequate amount of time but is very tired and fatigued throughout the day. ? ?02/28/2020: The patient reports that she has had some worsening in her significant pain symptoms with cold and atmospheric changes.  She continues to have significant issues with her upper extremities related to complex regional pain syndrome and fibromyalgia.  The patient also reports that she has had significant psychosocial stressors with her closest friend having terminal cancer and being given 3 months to live.  The patient and her friend are talking about the patient taking over care of the patient's granddaughter and the patient also feels a great deal of need to do what ever she can for her friend going forward.  This is a very difficult and stressful situation for the patient on top of her  numerous medical and pain issues.  The patient reports that her sleep continues to be improved but limited with pain disrupting sleep patterns.  The patient has been followed by Dr. Wess Botts MD [facility]) for some time for pain management and she is now been referred for an evaluation for spinal cord stimulator trialing and possible implantation as well as being scheduled for ketamine infusions.  They  are still working on Designer, television/film set. coverage for the ketamine infusions.  I think that the patient is an excellent candidate for both of these possible options.  The patient and I talked about the appropriateness for ketamine infusions due to her wide spectrum of issues for some time now and I am quite pleased to find out that her treating pain specialist's clinic is now doing ketamine infusions that she appears to be an excellent candidate.  She also appears to be an excellent candidate for spinal cord stimulator trialing and possible implantation. ? ?03/27/2020: The patient reports that she has gone through all of the preliminary work-up to have a spinal cord stimulator trial conducted and is looking forward with hope that it will help with her significant cervical related pain.  The patient is also has an initial appointment to assess for possible ketamine infusion therapies as well.  We reviewed issues related to both the spinal cord stimulator and ketamine infusion with the patient as well as worked on therapeutic interventions around her chronic pain/complex regional pain syndrome diagnoses.  The patient reports that she still has significant psychosocial stressors with her closest friend dying from breast cancer and in the last month or 2 of her life.  At this point, the patient does not look like she will have to take over her friend's granddaughters care as the family is hoping that an uncle of the young girl will take over care. ? ?04/24/2020: The patient returns reporting that she has gotten her spinal cord stimulator trialing device and has it implanted currently.  However, there have been mechanical problems with the device and it has been shutting off independently and the patient has not been able to get a good reading on how much it has helped her.  The patient reports that she did experience some brief improvement when she knew that it was on but it would turn off.  She is gone back to  the device maker and they could not figure out the problem.  They are going to put a new external device in and extend the spinal cord stimulator trial in face for 2 days.  The patient has also had her first ketamine infusion trial.  She reports that she was told she was hallucinating and dissociative during this phase and experienced some improvement over the rest of the day as far as her mood and pain symptoms but it was over within 24 hours.  The patient is scheduled to have her next ketamine infusion coming up. ? ?08/07/2020: The patient reports that they are looking at doing a new spinal cord stimulator trial as the device did not work properly during her 7-day trial.  In fact, they attempted to extended to 10 days hoping to get it working correctly.  However, the patient reports that she felt like it did help the first day when it was apparently working but shortly stopped working and it sounds like there were never able to get it working properly.  The patient reports that at this point, her anesthesiologist/pain specialist does not think  that she should have any more ketamine trials but the patient would like to try them again.  The patient reports that the doctor felt that she had an adverse reaction to it but her husband was present and did not describe any severe reaction.  At this point, she is planning to talk to him again about possibly having another ketamine infusion if possible.  The patient reports that she continues with her significant chronic pain symptoms but she is managing day-to-day.  She reports that there continues to be a major stressor in her life relative to a very close friend who has terminal cancer and the cancer is continuing to progress. ? ?09/11/2020: The patient reports that her friend is now being managed by hospice and appears to be at the end of her life.  This has been stressful but the patient has been able to adjust to the impending reality with time and she feels like she is  handling it fairly well.  The patient reports that she continues to have significant pain in her arms radiating from her neck.  The patient reports that at this point she has not been able to return for [facility]

## 2021-05-16 NOTE — Progress Notes (Signed)
? ? ? ?Chief Complaint:  ? ?OBESITY ?Sally [name] is here to discuss her progress with her obesity treatment plan along with follow-up of her obesity related diagnoses. Sally [name] is on keeping a food journal and adhering to recommended goals of 1300-1600 calories and 85+ grams of protein daily and states she is following her eating plan approximately 50% of the time. Sally [name] states she is at the gym for 60 minutes 4 times per week. ? ?Today's visit was #: 12 ?Starting weight: 273 lbs ?Starting date: 08/07/2020 ?Today's weight: 273 lbs ?Today's date: 05/06/2021 ?Total lbs lost to date: 0 ?Total lbs lost since last in-office visit: 3 ? ?Interim History: Sally [name] is doing well with weight loss. She is on Wegovy and she feels like it is helping, but she is still having excessive hunger signals. ? ?Subjective:  ? ?1. Vitamin D deficiency ?Sally [name] is on Vitamin D, but her level is not yet at goal. She is due for labs. ? ?2. Insulin resistance ?Sally [name] is on GLP-1, and she is doing well with her diet and weight loss. She is due for labs. ? ?3. Other specified hypothyroidism ?Sally [name] is on Synthroid and Cytomel, and she is due for labs. No tremors or palpitations were noted. ? ?Assessment/Plan:  ? ?1. Vitamin D deficiency ?We will check labs today, and Sally [name] will follow-up for routine testing of Vitamin D, at least 2-3 times per year to avoid over-replacement. ? ?- VITAMIN D 25 Hydroxy (Vit-D Deficiency, Fractures) ? ?2. Insulin resistance ?We will check labs today. Francesa will continue to work on weight loss, exercise, and decreasing simple carbohydrates to help decrease the risk of diabetes. Sally [name] agreed to follow-up with [place] as directed to closely monitor her progress. ? ?- Vitamin B12 ?- CMP14+EGFR ?- Lipid Panel With LDL/HDL Ratio ?- Insulin, random ?- Hemoglobin A1c ? ?3. Other specified hypothyroidism ?We will check labs today. Orders and follow up as documented in patient record. ? ?- TSH ? ?4. Obesity with current BMI of 41.6 ?Sally [name] is  currently in the action stage of change. As such, her goal is to continue with weight loss efforts. She has agreed to keeping a food journal and adhering to recommended goals of 1300-1600 calories and 85+ grams of protein daily.  ? ?We discussed various medication options to help Sally [name] with her weight loss efforts and we both agreed to increase Wegovy to 1 mg weekly, and we will refill for 1 month. ? ?- Semaglutide-Weight Management (WEGOVY) 1 MG/0.5ML SOAJ; Inject 1 mg into the skin once a week.  Dispense: 2 mL; Refill: 0 ? ?Exercise goals: As is. ? ?Behavioral modification strategies: increasing lean protein intake. ? ?Sally [name] has agreed to follow-up with our clinic in 4 weeks. She was informed of the importance of frequent follow-up visits to maximize her success with intensive lifestyle modifications for her multiple health conditions.  ? ?Sally [name] was informed we would discuss her lab results at her next visit unless there is a critical issue that needs to be addressed sooner. Sally [name] agreed to keep her next visit at the agreed upon time to discuss these results. ? ?Objective:  ? ?Blood pressure (!) 103/50, pulse 98, temperature 97.8 ?F (36.6 ?C), height _0  (1.727 m), weight 273 lb (123.8 kg), last menstrual period 10/26/2015, SpO2 97 %. ?Body mass index is 41.51 kg/m?. ? ?General: Cooperative, alert, well developed, in no acute distress. ?HEENT: Conjunctivae and lids unremarkable. ?Cardiovascular: Regular rhythm.  ?Lungs: Normal work of breathing. ?Neurologic: No focal deficits.  ? ?  Lab Results  ?Component Value Date  ? CREATININE 0.72 05/06/2021  ? BUN 13 05/06/2021  ? NA 141 05/06/2021  ? K 4.6 05/06/2021  ? CL 106 05/06/2021  ? CO2 24 05/06/2021  ? ?Lab Results  ?Component Value Date  ? ALT 17 05/06/2021  ? AST 17 05/06/2021  ? ALKPHOS 92 05/06/2021  ? BILITOT 0.2 05/06/2021  ? ?Lab Results  ?Component Value Date  ? HGBA1C 5.5 05/06/2021  ? HGBA1C 5.3 08/07/2020  ? HGBA1C 5.3 04/03/2015  ? ?Lab Results   ?Component Value Date  ? INSULIN 4.2 05/06/2021  ? INSULIN 4.9 08/07/2020  ? ?Lab Results  ?Component Value Date  ? TSH 0.010 (L) 05/06/2021  ? ?Lab Results  ?Component Value Date  ? CHOL 151 05/06/2021  ? HDL 44 05/06/2021  ? [place] 92 05/06/2021  ? TRIG 75 05/06/2021  ? CHOLHDL 5 09/03/2016  ? ?Lab Results  ?Component Value Date  ? VD25OH 45.4 05/06/2021  ? VD25OH 34 01/08/2016  ? VD25OH 21 (L) 10/19/2015  ? ?Lab Results  ?Component Value Date  ? WBC 8.9 10/20/2018  ? HGB 15.1 (H) 10/20/2018  ? HCT 44.5 10/20/2018  ? MCV 98.0 10/20/2018  ? PLT 194 10/20/2018  ? ?Lab Results  ?Component Value Date  ? FERRITIN 67 07/27/2014  ? ? ?Obesity Behavioral Intervention:  ? ?Approximately 15 minutes were spent on the discussion below. ? ?ASK: ?We discussed the diagnosis of obesity with Sally [name] today and Sally [name] agreed to give [place] permission to discuss obesity behavioral modification therapy today. ? ?ASSESS: ?Sally [name] has the diagnosis of obesity and her BMI today is 41.6. Sally [name] is in the action stage of change.  ? ?ADVISE: ?Sally [name] was educated on the multiple health risks of obesity as well as the benefit of weight loss to improve her health. She was advised of the need for long term treatment and the importance of lifestyle modifications to improve her current health and to decrease her risk of future health problems. ? ?AGREE: ?Multiple dietary modification options and treatment options were discussed and Sally [name] agreed to follow the recommendations documented in the above note. ? ?ARRANGE: ?Sally [name] was educated on the importance of frequent visits to treat obesity as outlined per CMS and USPSTF guidelines and agreed to schedule her next follow up appointment today. ? ?Attestation Statements:  ? ?Reviewed by clinician on day of visit: allergies, medications, problem list, medical history, surgical history, family history, social history, and previous encounter notes. ? ? ?I, Trixie Dredge, am acting as transcriptionist for [name], MD. ? ?I have reviewed the above documentation for accuracy and completeness, and I agree with the above. -  [name], MD ? ? ?

## 2021-06-04 ENCOUNTER — Ambulatory Visit: Payer: Medicare Other | Admitting: Psychology

## 2021-06-04 ENCOUNTER — Encounter (INDEPENDENT_AMBULATORY_CARE_PROVIDER_SITE_OTHER): Payer: Self-pay | Admitting: Family Medicine

## 2021-06-04 ENCOUNTER — Ambulatory Visit (INDEPENDENT_AMBULATORY_CARE_PROVIDER_SITE_OTHER): Payer: Medicare Other | Admitting: Family Medicine

## 2021-06-04 VITALS — BP 136/79 | HR 95 | Temp 97.7°F | Ht 68.0 in | Wt 268.0 lb

## 2021-06-04 DIAGNOSIS — F3289 Other specified depressive episodes: Secondary | ICD-10-CM

## 2021-06-04 DIAGNOSIS — F32A Depression, unspecified: Secondary | ICD-10-CM | POA: Insufficient documentation

## 2021-06-04 DIAGNOSIS — K219 Gastro-esophageal reflux disease without esophagitis: Secondary | ICD-10-CM | POA: Diagnosis not present

## 2021-06-04 DIAGNOSIS — Z6841 Body Mass Index (BMI) 40.0 and over, adult: Secondary | ICD-10-CM

## 2021-06-04 DIAGNOSIS — E669 Obesity, unspecified: Secondary | ICD-10-CM | POA: Diagnosis not present

## 2021-06-04 DIAGNOSIS — Z7985 Long-term (current) use of injectable non-insulin antidiabetic drugs: Secondary | ICD-10-CM

## 2021-06-04 MED ORDER — WEGOVY 1 MG/0.5ML ~~LOC~~ SOAJ: SUBCUTANEOUS | 0 refills | Status: DC

## 2021-06-18 NOTE — Progress Notes (Signed)
Chief Complaint:   OBESITY Sally [name] is here to discuss her progress with her obesity treatment plan along with follow-up of her obesity related diagnoses. Sally [name] is on keeping a food journal and adhering to recommended goals of 1300-1600 calories and 85+ grams of protein daily and states she is following her eating plan approximately 70% of the time. Sally [name] states she is doing aerobics for 60 minutes 4 times per week.  Today's visit was #: 39 Starting weight: 273 lbs Starting date: 08/07/2020 Today's weight: 268 lbs Today's date: 06/04/2021 Total lbs lost to date: 5 Total lbs lost since last in-office visit: 5  Interim History: Sally [name] has done well with weight loss. Her dose was increased to 1 mg and she notes significant increase in nausea.   Subjective:   1. Gastroesophageal reflux disease, unspecified whether esophagitis present Sally [name] changed her PPI to pantoprazole. Her GLP-1 has likely increased her GERD symptoms.   2. Other depression with emotional eating Sally [name] notes decreased emotional eating behaviors primarily due to increased nausea.   Assessment/Plan:   1. Gastroesophageal reflux disease, unspecified whether esophagitis present Sally [name] will continue her medications and decrease Wegovy to every 10 days, and she is ok to take OTC Tums in addition for now as needed.  2. Other depression with emotional eating Sally [name] is to decrease Wegovy to every 10 days, and we will reassess her emotional eating behavior in 3 weeks at her next visit.  4. Obesity, Current BMI 40.9 Sally [name] is currently in the action stage of change. As such, her goal is to continue with weight loss efforts. She has agreed to keeping a food journal and adhering to recommended goals of 1300-1600 calories and 85+ grams of protein daily.   We discussed various medication options to help Sally [name] with her weight loss efforts and we both agreed to continue Wegovy 1 mg q week, and we will refill for 1 month (Sally [name] to take  every 10 days for now).  - Semaglutide-Weight Management (WEGOVY) 1 MG/0.5ML SOAJ; Every 10 days  Dispense: 2 mL; Refill: 0  Exercise goals: As is.  Behavioral modification strategies: increasing lean protein intake.  Sally [name] has agreed to follow-up with our clinic in 3 weeks. She was informed of the importance of frequent follow-up visits to maximize her success with intensive lifestyle modifications for her multiple health conditions.   Objective:   Blood pressure 136/79, pulse 95, temperature 97.7 F (36.5 C), height '5\' 8"'$  (1.727 m), weight 268 lb (121.6 kg), last menstrual period 10/26/2015, SpO2 99 %. Body mass index is 40.75 kg/m.  General: Cooperative, alert, well developed, in no acute distress. HEENT: Conjunctivae and lids unremarkable. Cardiovascular: Regular rhythm.  Lungs: Normal work of breathing. Neurologic: No focal deficits.   Lab Results  Component Value Date   CREATININE 0.72 05/06/2021   BUN 13 05/06/2021   NA 141 05/06/2021   K 4.6 05/06/2021   CL 106 05/06/2021   CO2 24 05/06/2021   Lab Results  Component Value Date   ALT 17 05/06/2021   AST 17 05/06/2021   ALKPHOS 92 05/06/2021   BILITOT 0.2 05/06/2021   Lab Results  Component Value Date   HGBA1C 5.5 05/06/2021   HGBA1C 5.3 08/07/2020   HGBA1C 5.3 04/03/2015   Lab Results  Component Value Date   INSULIN 4.2 05/06/2021   INSULIN 4.9 08/07/2020   Lab Results  Component Value Date   TSH 0.010 (L) 05/06/2021   Lab Results  Component Value Date  CHOL 151 05/06/2021   HDL 44 05/06/2021   LDLCALC 92 05/06/2021   TRIG 75 05/06/2021   CHOLHDL 5 09/03/2016   Lab Results  Component Value Date   VD25OH 45.4 05/06/2021   VD25OH 34 01/08/2016   VD25OH 21 (L) 10/19/2015   Lab Results  Component Value Date   WBC 8.9 10/20/2018   HGB 15.1 (H) 10/20/2018   HCT 44.5 10/20/2018   MCV 98.0 10/20/2018   PLT 194 10/20/2018   Lab Results  Component Value Date   FERRITIN 67 07/27/2014     Obesity Behavioral Intervention:   Approximately 15 minutes were spent on the discussion below.  ASK: We discussed the diagnosis of obesity with Sally [name] today and Sally [name] agreed to give [place] permission to discuss obesity behavioral modification therapy today.  ASSESS: Sally [name] has the diagnosis of obesity and her BMI today is 40.9. Sally [name] is in the action stage of change.   ADVISE: Sally [name] was educated on the multiple health risks of obesity as well as the benefit of weight loss to improve her health. She was advised of the need for long term treatment and the importance of lifestyle modifications to improve her current health and to decrease her risk of future health problems.  AGREE: Multiple dietary modification options and treatment options were discussed and Sally [name] agreed to follow the recommendations documented in the above note.  ARRANGE: Sally [name] was educated on the importance of frequent visits to treat obesity as outlined per CMS and USPSTF guidelines and agreed to schedule her next follow up appointment today.  Attestation Statements:   Reviewed by clinician on day of visit: allergies, medications, problem list, medical history, surgical history, family history, social history, and previous encounter notes.   I, Trixie Dredge, am acting as transcriptionist for [name], MD.  I have reviewed the above documentation for accuracy and completeness, and I agree with the above. -  [name], MD

## 2021-06-20 ENCOUNTER — Encounter (INDEPENDENT_AMBULATORY_CARE_PROVIDER_SITE_OTHER): Payer: Self-pay | Admitting: Family Medicine

## 2021-06-25 ENCOUNTER — Ambulatory Visit (INDEPENDENT_AMBULATORY_CARE_PROVIDER_SITE_OTHER): Payer: Medicare Other | Admitting: Family Medicine

## 2021-06-25 ENCOUNTER — Encounter (INDEPENDENT_AMBULATORY_CARE_PROVIDER_SITE_OTHER): Payer: Self-pay | Admitting: Family Medicine

## 2021-06-25 VITALS — BP 135/83 | HR 83 | Temp 97.7°F | Ht 68.0 in | Wt 259.0 lb

## 2021-06-25 DIAGNOSIS — E559 Vitamin D deficiency, unspecified: Secondary | ICD-10-CM

## 2021-06-25 DIAGNOSIS — E669 Obesity, unspecified: Secondary | ICD-10-CM

## 2021-06-25 DIAGNOSIS — R5383 Other fatigue: Secondary | ICD-10-CM

## 2021-06-25 DIAGNOSIS — Z6839 Body mass index (BMI) 39.0-39.9, adult: Secondary | ICD-10-CM | POA: Diagnosis not present

## 2021-06-25 DIAGNOSIS — Z6841 Body Mass Index (BMI) 40.0 and over, adult: Secondary | ICD-10-CM

## 2021-06-25 MED ORDER — VITAMIN D (ERGOCALCIFEROL) 1.25 MG (50000 UNIT) PO CAPS: ORAL_CAPSULE | ORAL | 0 refills | Status: DC

## 2021-06-27 NOTE — Progress Notes (Unsigned)
Chief Complaint:   OBESITY Sally [name] is here to discuss her progress with her obesity treatment plan along with follow-up of her obesity related diagnoses. Sally [name] is on keeping a food journal and adhering to recommended goals of 1300-1600 calories and 85+ grams of protein daily and states she is following her eating plan approximately 75% of the time. Sally [name] states she is walking on the treadmill for 60 minutes 2 times per week.  Today's visit was #: 14 Starting weight: 273 lbs Starting date: 08/07/2020 Today's weight: 259 lbs Today's date: 06/25/2021 Total lbs lost to date: 14 Total lbs lost since last in-office visit: 9  Interim History: Sally [name] continues to do very well with weight loss. She is struggling however with meeting her protein goals, and she is averaging about half of what she needs. She is likely decreasing her RMR and this will stall her weight loss soon if it is not fixed.   Subjective:   1. Other fatigue Sally [name] notes increased fatigue in the last month. Her sleep is unchanged but her nutrition is no longer ideal. Her B12 is at goal.   2. Vitamin D deficiency Sally [name] is on Vitamin prescription, but her level is not yet at goal. No side effects were noted.   Assessment/Plan:   1. Other fatigue Sally [name] was encouraged to increase protein to help with fatigue, and we will follow up at her next visit in 3 weeks.   2. Vitamin D deficiency We will refill prescription Vitamin D for 1 month. Sally [name] will follow-up for routine testing of Vitamin D, at least 2-3 times per year to avoid over-replacement.  - Vitamin D, Ergocalciferol, (DRISDOL) 1.25 MG (50000 UNIT) CAPS capsule; TAKE ONE CAPSULE EVERY 7 DAYS  Dispense: 4 capsule; Refill: 0  3. Obesity, Current BMI 39.5 Sally [name] is currently in the action stage of change. As such, her goal is to continue with weight loss efforts. She has agreed to keeping a food journal and adhering to recommended goals of 1300-1600 calories and 85+ grams of  protein daily.   Sally [name] was given ideas/recipes to increase her protein, especially in the AM with real food versus supplements.   We discussed various medication options to help Sally [name] with her weight loss efforts and we both agreed to continue [facility] and we will refill for 1 month.  Exercise goals: As is.  Behavioral modification strategies: increasing lean protein intake, increasing water intake, and better snacking choices.  Sally [name] has agreed to follow-up with our clinic in 3 weeks. She was informed of the importance of frequent follow-up visits to maximize her success with intensive lifestyle modifications for her multiple health conditions.   Objective:   Blood pressure 135/83, pulse 83, temperature 97.7 F (36.5 C), height '5\' 8"'$  (1.727 m), weight 259 lb (117.5 kg), last menstrual period 10/26/2015, SpO2 96 %. Body mass index is 39.38 kg/m.  General: Cooperative, alert, well developed, in no acute distress. HEENT: Conjunctivae and lids unremarkable. Cardiovascular: Regular rhythm.  Lungs: Normal work of breathing. Neurologic: No focal deficits.   Lab Results  Component Value Date   CREATININE 0.72 05/06/2021   BUN 13 05/06/2021   NA 141 05/06/2021   K 4.6 05/06/2021   CL 106 05/06/2021   CO2 24 05/06/2021   Lab Results  Component Value Date   ALT 17 05/06/2021   AST 17 05/06/2021   ALKPHOS 92 05/06/2021   BILITOT 0.2 05/06/2021   Lab Results  Component Value Date   HGBA1C 5.5  05/06/2021   HGBA1C 5.3 08/07/2020   HGBA1C 5.3 04/03/2015   Lab Results  Component Value Date   INSULIN 4.2 05/06/2021   INSULIN 4.9 08/07/2020   Lab Results  Component Value Date   TSH 0.010 (L) 05/06/2021   Lab Results  Component Value Date   CHOL 151 05/06/2021   HDL 44 05/06/2021   LDLCALC 92 05/06/2021   TRIG 75 05/06/2021   CHOLHDL 5 09/03/2016   Lab Results  Component Value Date   VD25OH 45.4 05/06/2021   VD25OH 34 01/08/2016   VD25OH 21 (L) 10/19/2015   Lab  Results  Component Value Date   WBC 8.9 10/20/2018   HGB 15.1 (H) 10/20/2018   HCT 44.5 10/20/2018   MCV 98.0 10/20/2018   PLT 194 10/20/2018   Lab Results  Component Value Date   FERRITIN 67 07/27/2014    Obesity Behavioral Intervention:   Approximately 15 minutes were spent on the discussion below.  ASK: We discussed the diagnosis of obesity with [place] today and Sally [name] agreed to give [place] permission to discuss obesity behavioral modification therapy today.  ASSESS: Sally [name] has the diagnosis of obesity and her BMI today is 39.5. Sally [name] is in the action stage of change.   ADVISE: Sally [name] was educated on the multiple health risks of obesity as well as the benefit of weight loss to improve her health. She was advised of the need for long term treatment and the importance of lifestyle modifications to improve her current health and to decrease her risk of future health problems.  AGREE: Multiple dietary modification options and treatment options were discussed and Sally [name] agreed to follow the recommendations documented in the above note.  ARRANGE: Sally [name] was educated on the importance of frequent visits to treat obesity as outlined per CMS and USPSTF guidelines and agreed to schedule her next follow up appointment today.  Attestation Statements:   Reviewed by clinician on day of visit: allergies, medications, problem list, medical history, surgical history, family history, social history, and previous encounter notes.   I, Trixie Dredge, am acting as transcriptionist for [name], MD.  I have reviewed the above documentation for accuracy and completeness, and I agree with the above. -  [name], MD

## 2021-06-29 ENCOUNTER — Other Ambulatory Visit (INDEPENDENT_AMBULATORY_CARE_PROVIDER_SITE_OTHER): Payer: Self-pay | Admitting: Family Medicine

## 2021-07-01 MED ORDER — WEGOVY 1 MG/0.5ML ~~LOC~~ SOAJ: SUBCUTANEOUS | 0 refills | Status: DC

## 2021-07-03 ENCOUNTER — Encounter: Payer: No Typology Code available for payment source | Admitting: Psychology

## 2021-07-15 ENCOUNTER — Ambulatory Visit (INDEPENDENT_AMBULATORY_CARE_PROVIDER_SITE_OTHER): Payer: Medicare Other | Admitting: Family Medicine

## 2021-08-05 ENCOUNTER — Encounter (INDEPENDENT_AMBULATORY_CARE_PROVIDER_SITE_OTHER): Payer: Self-pay | Admitting: Family Medicine

## 2021-08-05 ENCOUNTER — Ambulatory Visit (INDEPENDENT_AMBULATORY_CARE_PROVIDER_SITE_OTHER): Payer: Medicare Other | Admitting: Family Medicine

## 2021-08-05 ENCOUNTER — Encounter: Payer: No Typology Code available for payment source | Attending: Psychology | Admitting: Psychology

## 2021-08-05 VITALS — BP 134/79 | HR 83 | Temp 97.7°F | Ht 68.0 in | Wt 259.0 lb

## 2021-08-05 DIAGNOSIS — G90513 Complex regional pain syndrome I of upper limb, bilateral: Secondary | ICD-10-CM | POA: Insufficient documentation

## 2021-08-05 DIAGNOSIS — F419 Anxiety disorder, unspecified: Secondary | ICD-10-CM | POA: Insufficient documentation

## 2021-08-05 DIAGNOSIS — Z6839 Body mass index (BMI) 39.0-39.9, adult: Secondary | ICD-10-CM

## 2021-08-05 DIAGNOSIS — G894 Chronic pain syndrome: Secondary | ICD-10-CM | POA: Diagnosis present

## 2021-08-05 DIAGNOSIS — F32A Depression, unspecified: Secondary | ICD-10-CM | POA: Diagnosis present

## 2021-08-05 DIAGNOSIS — E669 Obesity, unspecified: Secondary | ICD-10-CM | POA: Diagnosis not present

## 2021-08-05 DIAGNOSIS — R632 Polyphagia: Secondary | ICD-10-CM

## 2021-08-05 DIAGNOSIS — M797 Fibromyalgia: Secondary | ICD-10-CM | POA: Diagnosis not present

## 2021-08-05 MED ORDER — METFORMIN HCL 500 MG PO TABS: 500.0000 mg | ORAL_TABLET | Freq: Every day | ORAL | 0 refills | Status: DC

## 2021-08-06 ENCOUNTER — Encounter: Payer: Self-pay | Admitting: Psychology

## 2021-08-06 NOTE — Progress Notes (Signed)
Patient:  Sally [name]   DOB: 1975-08-19  MR Number: [id number]  Location: [facility] [place] PHYSICAL MEDICINE AND REHABILITATION [place], [address] [id number] [address] [place] [address] Dept: [phone]  Start: 4 PM End: 5 PM  Today's visit was an in person visit is conducted in my outpatient clinic office with the patient and myself present in the office.   Provider/Observer:     Edgardo Roys PsyD  Chief Complaint:      Chief Complaint  Patient presents with   Anxiety   Depression   Pain   Neck Pain    Reason For Service:     Sally [name] is a 46 year old female referred by [place] for therapeutic interventions due to chronic pain symptoms.  The patient reports that she has great difficulty standing, walking for long times, elbow pain, migraines, hip pain, shoulder and neck pain.  The patient fell at work and fractured both her elbows and also had knee replacement surgery.  The patient has been taking maintenance doses of opioid pain medications for some time.  The patient reports that she has a great deal of difficulty functioning and has very poor sleep and wakes up numerous times each night.  The patient also has dealt with depression and anxiety for many years and has been hospitalized for depression anxiety years ago.  She has been receiving counseling for anxiety and depression and takes Trintellix.   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   The patient describes chronic issues of depression anxiety but the development of severe chronic pain.  The patient reports that she is  continuing to have some pain in her back post surgery and had a recent fall.  Follow-up MRI did not suggest any structural damage or injury from this fall.  The patient reports that her symptoms associated with complex regional pain syndrome have worsened recently.  11/01/2019: The patient is continued to have significant pain and distress with worsening episodes of her complex regional pain syndrome and fibromyalgia type symptoms.  The patient has been very frustrated at times with her lack of ability to function and do daily activities.  01/03/2020: The patient reports that she is continued to have significant pain but has particularly had worsening of her fatigue and somnolence.  She reports that she feels like she is sleeping an adequate amount of time but is very tired and fatigued throughout the day.  02/28/2020: The patient reports that she has had some worsening in her significant pain symptoms with cold and atmospheric changes.  She continues to have significant issues with her upper extremities related to complex regional pain syndrome and fibromyalgia.  The patient also reports that she has had significant psychosocial stressors with her closest friend having terminal cancer and being given 3 months to live.  The patient and her friend are talking about the patient taking over care of the patient's granddaughter and the patient also feels a great deal of need to do what ever she can for her friend going forward.  This is a very difficult and stressful situation for the patient on top of her  numerous medical and pain issues.  The patient reports that her sleep continues to be improved but limited with pain disrupting sleep patterns.  The patient has been followed by Dr. Wess Botts MD [facility] for some time for pain management and she is now been referred for an evaluation for spinal cord stimulator trialing and possible implantation as well as being scheduled for ketamine infusions.  They  are still working on Designer, television/film set. coverage for the ketamine infusions.  I think that the patient is an excellent candidate for both of these possible options.  The patient and I talked about the appropriateness for ketamine infusions due to her wide spectrum of issues for some time now and I am quite pleased to find out that her treating pain specialist's clinic is now doing ketamine infusions that she appears to be an excellent candidate.  She also appears to be an excellent candidate for spinal cord stimulator trialing and possible implantation.  03/27/2020: The patient reports that she has gone through all of the preliminary work-up to have a spinal cord stimulator trial conducted and is looking forward with hope that it will help with her significant cervical related pain.  The patient is also has an initial appointment to assess for possible ketamine infusion therapies as well.  We reviewed issues related to both the spinal cord stimulator and ketamine infusion with the patient as well as worked on therapeutic interventions around her chronic pain/complex regional pain syndrome diagnoses.  The patient reports that she still has significant psychosocial stressors with her closest friend dying from breast cancer and in the last month or 2 of her life.  At this point, the patient does not look like she will have to take over her friend's granddaughters care as the family is hoping that an uncle of the young girl will take over care.  04/24/2020: The patient returns reporting that she has gotten her spinal cord stimulator trialing device and has it implanted currently.  However, there have been mechanical problems with the device and it has been shutting off independently and the patient has not been able to get a good reading on how much it has helped her.  The patient reports that she did experience some brief improvement when she knew that it was on but it would turn off.  She is gone back to  the device maker and they could not figure out the problem.  They are going to put a new external device in and extend the spinal cord stimulator trial in face for 2 days.  The patient has also had her first ketamine infusion trial.  She reports that she was told she was hallucinating and dissociative during this phase and experienced some improvement over the rest of the day as far as her mood and pain symptoms but it was over within 24 hours.  The patient is scheduled to have her next ketamine infusion coming up.  08/07/2020: The patient reports that they are looking at doing a new spinal cord stimulator trial as the device did not work properly during her 7-day trial.  In fact, they attempted to extended to 10 days hoping to get it working correctly.  However, the patient reports that she felt like it did help the first day when it was apparently working but shortly stopped working and it sounds like there were never able to get it working properly.  The patient reports that at this point, her anesthesiologist/pain specialist does not think  that she should have any more ketamine trials but the patient would like to try them again.  The patient reports that the doctor felt that she had an adverse reaction to it but her husband was present and did not describe any severe reaction.  At this point, she is planning to talk to him again about possibly having another ketamine infusion if possible.  The patient reports that she continues with her significant chronic pain symptoms but she is managing day-to-day.  She reports that there continues to be a major stressor in her life relative to a very close friend who has terminal cancer and the cancer is continuing to progress.  09/11/2020: The patient reports that her friend is now being managed by hospice and appears to be at the end of her life.  This has been stressful but the patient has been able to adjust to the impending reality with time and she feels like she is  handling it fairly well.  The patient reports that she continues to have significant pain in her arms radiating from her neck.  The patient reports that at this point she has not been able to return for ketamine therapies and not discussed strategies to follow-up with [facility].  11/06/2020: The patient has continued to have a lot of stress with depression and anxiety worsening particular around the impending death of a very close friend.  The friend is dying from metastatic cancer and the patient is one of the primary helpers for her friend.  The patient is very close to this friend.  The patient interacts with hospice and is spending a lot of time helping.  This is been coinciding with a worsening of her cervical pain radiating down her right arm primarily.  She continues to struggle with severe pain and the depression and anxiety associated around all of these issues has been worse.  12/11/2020: The patient reports that she has had a very stressful time recently.  Her close friend has passed away and while her friend passed away peacefully and was in the care of hospice at the end of her life there is [employer] "shit show" happening around her friend's death.  The patient reports that the friends son who has been very active in the friend's life tragically died just a couple of days after the friend's death in a motor vehicle accident.  The son who had had little interaction with her friend did show up and there was conflict between the friend's new husband and the son regarding ownership of some of the property.  The friend had completed an extensive well that gave all of her estate to the grandchildren but there was still a great deal of arguing.  The husband had lifetime rights to the house for now.  The patient reports that the death of her friend's son happened before the funeral and so they ended up having a joint funeral for the son and the patient's friend.  The patient reports that her  depression has been very severe and the fact that she is the executor for her friend's estate has had a great deal of stress.  Her pain continues to be very severe.  05/02/2021:  Patient continues to struggle with pain and has not returned back to [facility] yet for any interventions.  They are still working on plan.  Patient still with stress over friends death and the fall out over friends grandchild etc.  Still a very complicated situation.  08/05/2021:  The patient reports that she has had a lot on her plate since I saw her last in April.  The patient reports that she now has her friend's great-granddaughter living permanently with her and is becoming part of her family.  This young lady does have a lot of behavioral issues and it is a challenge for the patient to manage some of the issues.  However, she feels a reward for being able to provide a stable home and follow the wishes of her friend after her friend passed away from cancer.  Interventions Strategy:  Cognitive/behavioral therapeutic interventions along with coping skills and strategies around chronic pain, sleep disturbance and depression/anxiety.  Participation Level:   Active  Participation Quality:  Appropriate and Attentive      Behavioral Observation:  Well Groomed, Alert, and Appropriate.   Current Psychosocial Factors: Patient is still looking at having friend's grand daughter come live with patient and will be starting soon.   Content of Session:   Reviewed current symptoms and continue to work on therapeutic interventions around issues related to her chronic pain and depression as well as issues related to her chronic complex regional pain syndrome and fibromyalgia symptoms.   Current Status:   The patient is hoping that they are able to do another spinal cord stimulator trial as she felt like it was helpful the first day before the device stopped working.  Patient Progress:   The patient reports that her overall sleep  has improved from initial status but continues to be problematic.  The patient reports that her vein plays a big role in her difficulty sleeping.  Depression anxiety continue to be quite problematic for the patient.  Impression/Diagnosis:   Velmer Broadfoot is a 46 year old female referred by [place] for therapeutic interventions due to chronic pain symptoms.  The patient reports that she has great difficulty standing, walking for long times, elbow pain, migraines, hip pain, shoulder and neck pain.  The patient fell at work and fractured both her elbows and also had knee replacement surgery.  The patient has been taking maintenance doses of opioid pain medications for some time.  The patient reports that she has a great deal of difficulty functioning and has very poor sleep and wakes up numerous times each night.  The patient also has dealt with depression and anxiety for many years and has been hospitalized for depression anxiety years ago.  She has been receiving counseling for anxiety and depression and takes Trintellix.   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   The patient describes chronic issues of depression anxiety but the development of severe chronic pain  After a fall at work in 2015 where she fractured both of her elbows, injured her knee and developed fibromyalgia/regional pain syndrome.  The patient reports that she has ongoing difficulties walking distances or standing.  She describes elbow pain, trouble lifting things and trouble writing for any period of time.  The patient describes significant sleep disturbance and is unable to fall asleep at night.  She reports that she  is always tired.  The patient describes a normal appetite.  The patient describes memory issues related to trouble remembering  things and concentration issues.  The patient reports that she is significantly less active with her children and husband and that her husband has to help more due to her significant pain.  We have continue to work on issues related to her pain and sleep status along with issues of depression and anxiety.  02/28/2020: The patient reports that she has had some worsening in her significant pain symptoms with cold and atmospheric changes.  She continues to have significant issues with her upper extremities related to complex regional pain syndrome and fibromyalgia.  The patient also reports that she has had significant psychosocial stressors with her closest friend having terminal cancer and being given 3 months to live.  The patient and her friend are talking about the patient taking over care of the patient's granddaughter and the patient also feels a great deal of need to do what ever she can for her friend going forward.  This is a very difficult and stressful situation for the patient on top of her numerous medical and pain issues.  The patient reports that her sleep continues to be improved but limited with pain disrupting sleep patterns.  The patient has been followed by Dr. Wess Botts MD [facility]) for some time for pain management and she is now been referred for an evaluation for spinal cord stimulator trialing and possible implantation as well as being scheduled for ketamine infusions.  They are still working on Designer, television/film set. coverage for the ketamine infusions.  I think that the patient is an excellent candidate for both of these possible options.  The patient and I talked about the appropriateness for ketamine infusions due to her wide spectrum of issues for some time now and I am quite pleased to find out that her treating pain specialist's clinic is now  doing ketamine infusions that she appears to be an excellent candidate.  She also appears to be an excellent candidate for spinal cord stimulator trialing and possible implantation.  04/24/2020: The patient returns reporting that she has gotten her spinal cord stimulator trialing device and has it implanted currently.  However, there have been mechanical problems with the device and it has been shutting off independently and the patient has not been able to get a good reading on how much it has helped her.  The patient reports that she did experience some brief improvement when she knew that it was on but it would turn off.  She is gone back to the device maker and they could not figure out the problem.  They are going to put a new external device in and extend the spinal cord stimulator trial in face for 2 days.  The patient has also had her first ketamine infusion trial.  She reports that she was told she was hallucinating and dissociative during this phase and experienced some improvement over the rest of the day as far as her mood and pain symptoms but it was over within 24 hours.  The patient is scheduled to have her next ketamine infusion coming up.  08/07/2020: The patient reports that they are looking at doing a new spinal cord stimulator trial as the device did not work properly during her 7-day trial.  In fact, they attempted to extended to 10 days hoping to get it working correctly.  However, the patient reports that she felt like it did help the first day when it was apparently working but shortly stopped working and it sounds like there were never  able to get it working properly.  The patient reports that at this point, her anesthesiologist/pain specialist does not think that she should have any more ketamine trials but the patient would like to try them again.  The patient reports that the doctor felt that she had an adverse reaction to it but her husband was present and did not describe any severe  reaction.  At this point, she is planning to talk to him again about possibly having another ketamine infusion if possible.  The patient reports that she continues with her significant chronic pain symptoms but she is managing day-to-day.  She reports that there continues to be a major stressor in her life relative to a very close friend who has terminal cancer and the cancer is continuing to progress.  09/11/2020: The patient reports that her friend is now being managed by hospice and appears to be at the end of her life.  This has been stressful but the patient has been able to adjust to the impending reality with time and she feels like she is handling it fairly well.  The patient reports that she continues to have significant pain in her arms radiating from her neck.  The patient reports that at this point she has not been able to return for ketamine therapies and not discussed strategies to follow-up with [facility].  11/06/2020: The patient has continued to have a lot of stress with depression and anxiety worsening particular around the impending death of a very close friend.  The friend is dying from metastatic cancer and the patient is one of the primary helpers for her friend.  The patient is very close to this friend.  The patient interacts with hospice and is spending a lot of time helping.  This is been coinciding with a worsening of her cervical pain radiating down her right arm primarily.  She continues to struggle with severe pain and the depression and anxiety associated around all of these issues has been worse.  12/11/2020: The patient reports that she has had a very stressful time recently.  Her close friend has passed away and while her friend passed away peacefully and was in the care of hospice at the end of her life there is [employer] "shit show" happening around her friend's death.  The patient reports that the friends son who has been very active in the friend's life  tragically died just a couple of days after the friend's death in a motor vehicle accident.  The son who had had little interaction with her friend did show up and there was conflict between the friend's new husband and the son regarding ownership of some of the property.  The friend had completed an extensive well that gave all of her estate to the grandchildren but there was still a great deal of arguing.  The husband had lifetime rights to the house for now.  The patient reports that the death of her friend's son happened before the funeral and so they ended up having a joint funeral for the son and the patient's friend.  The patient reports that her depression has been very severe and the fact that she is the executor for her friend's estate has had a great deal of stress.  Her pain continues to be very severe.  Today we continue to work on therapeutic interventions for coping and adjustment in dealing with significant depression and recent exacerbations and stress.  05/02/2021:  Patient continues to struggle with pain  and has not returned back to [facility] yet for any interventions.  They are still working on plan.  Patient still with stress over friends death and the fall out over friends grandchild etc.  Still a very complicated situation   Diagnosis: Complex regional pain syndrome, fibromyalgia, anxiety and depression, neck, shoulder and back pain.

## 2021-08-06 NOTE — Progress Notes (Unsigned)
Chief Complaint:   OBESITY Sally [name] is here to discuss her progress with her obesity treatment plan along with follow-up of her obesity related diagnoses. Sally [name] is on keeping a food journal and adhering to recommended goals of [id number] calories and 85+ grams of protein daily and states she is following her eating plan approximately 65% of the time. Sally [name] states she is doing 0 minutes 0 times per week.  Today's visit was #: 15 Starting weight: 273 lbs Starting date: 08/07/2020 Today's weight: 259 lbs Today's date: 08/05/2021 Total lbs lost to date: 14 Total lbs lost since last in-office visit: 0  Interim History: Sally [name] has done well with maintaining her weight loss.  She has had to stop Sally [name] as she felt her glucose levels dropped while on it.  Subjective:   1. Polyphagia Sally [name] notes polyphagia, worse in the second half of the day.  She could not tolerate a GLP-1 due to feeling hypoglycemic.  Assessment/Plan:   1. Polyphagia Sally [name] agreed to start metformin 500 mg every morning, with no refills. She will continue to work on diet, exercise and weight loss efforts. Orders and follow up as documented in patient record.  - metFORMIN (GLUCOPHAGE) 500 MG tablet; Take 1 tablet (500 mg total) by mouth daily with breakfast.  Dispense: 30 tablet; Refill: 0  2. Obesity, Current BMI 39.4 Sally [name] is currently in the action stage of change. As such, her goal is to continue with weight loss efforts. She has agreed to change to following a lower carbohydrate, vegetable and lean protein rich diet plan.   Behavioral modification strategies: increasing lean protein intake.  Sally [name] has agreed to follow-up with our clinic in 3 to 4 weeks. She was informed of the importance of frequent follow-up visits to maximize her success with intensive lifestyle modifications for her multiple health conditions.   Objective:   Blood pressure 134/79, pulse 83, temperature 97.7 F (36.5 C), height '5\' 8"'$  (1.727 m),  weight 259 lb (117.5 kg), last menstrual period 10/26/2015, SpO2 96 %. Body mass index is 39.38 kg/m.  General: Cooperative, alert, well developed, in no acute distress. HEENT: Conjunctivae and lids unremarkable. Cardiovascular: Regular rhythm.  Lungs: Normal work of breathing. Neurologic: No focal deficits.   Lab Results  Component Value Date   CREATININE 0.72 05/06/2021   BUN 13 05/06/2021   NA 141 05/06/2021   K 4.6 05/06/2021   CL 106 05/06/2021   CO2 24 05/06/2021   Lab Results  Component Value Date   ALT 17 05/06/2021   AST 17 05/06/2021   ALKPHOS 92 05/06/2021   BILITOT 0.2 05/06/2021   Lab Results  Component Value Date   HGBA1C 5.5 05/06/2021   HGBA1C 5.3 08/07/2020   HGBA1C 5.3 04/03/2015   Lab Results  Component Value Date   INSULIN 4.2 05/06/2021   INSULIN 4.9 08/07/2020   Lab Results  Component Value Date   TSH 0.010 (L) 05/06/2021   Lab Results  Component Value Date   CHOL 151 05/06/2021   HDL 44 05/06/2021   LDLCALC 92 05/06/2021   TRIG 75 05/06/2021   CHOLHDL 5 09/03/2016   Lab Results  Component Value Date   VD25OH 45.4 05/06/2021   VD25OH 34 01/08/2016   VD25OH 21 (L) 10/19/2015   Lab Results  Component Value Date   WBC 8.9 10/20/2018   HGB 15.1 (H) 10/20/2018   HCT 44.5 10/20/2018   MCV 98.0 10/20/2018   PLT 194 10/20/2018   Lab Results  Component Value Date   FERRITIN 67 07/27/2014    Obesity Behavioral Intervention:   Approximately 15 minutes were spent on the discussion below.  ASK: We discussed the diagnosis of obesity with Sha today and Rejina agreed to give [place] permission to discuss obesity behavioral modification therapy today.  ASSESS: Armine has the diagnosis of obesity and her BMI today is 39.4. Ryver is in the action stage of change.   ADVISE: Gray was educated on the multiple health risks of obesity as well as the benefit of weight loss to improve her health. She was advised of the need for long term  treatment and the importance of lifestyle modifications to improve her current health and to decrease her risk of future health problems.  AGREE: Multiple dietary modification options and treatment options were discussed and Klaire agreed to follow the recommendations documented in the above note.  ARRANGE: Alys was educated on the importance of frequent visits to treat obesity as outlined per CMS and USPSTF guidelines and agreed to schedule her next follow up appointment today.  Attestation Statements:   Reviewed by clinician on day of visit: allergies, medications, problem list, medical history, surgical history, family history, social history, and previous encounter notes.   I, Trixie Dredge, am acting as transcriptionist for [name], MD.  I have reviewed the above documentation for accuracy and completeness, and I agree with the above. -  [name], MD

## 2021-08-22 ENCOUNTER — Ambulatory Visit: Payer: Medicare Other | Admitting: Psychology

## 2021-08-28 ENCOUNTER — Encounter (INDEPENDENT_AMBULATORY_CARE_PROVIDER_SITE_OTHER): Payer: Self-pay | Admitting: Family Medicine

## 2021-08-28 ENCOUNTER — Ambulatory Visit (INDEPENDENT_AMBULATORY_CARE_PROVIDER_SITE_OTHER): Payer: Medicare Other | Admitting: Family Medicine

## 2021-08-28 ENCOUNTER — Encounter (INDEPENDENT_AMBULATORY_CARE_PROVIDER_SITE_OTHER): Payer: Self-pay

## 2021-08-28 VITALS — BP 124/76 | HR 77 | Temp 98.0°F | Ht 68.0 in | Wt 262.0 lb

## 2021-08-28 DIAGNOSIS — R632 Polyphagia: Secondary | ICD-10-CM

## 2021-08-28 DIAGNOSIS — Z7984 Long term (current) use of oral hypoglycemic drugs: Secondary | ICD-10-CM

## 2021-08-28 DIAGNOSIS — R7303 Prediabetes: Secondary | ICD-10-CM | POA: Diagnosis not present

## 2021-08-28 DIAGNOSIS — Z6839 Body mass index (BMI) 39.0-39.9, adult: Secondary | ICD-10-CM

## 2021-08-28 DIAGNOSIS — E669 Obesity, unspecified: Secondary | ICD-10-CM

## 2021-08-28 MED ORDER — METFORMIN HCL 500 MG PO TABS: 500.0000 mg | ORAL_TABLET | Freq: Every day | ORAL | 1 refills | Status: DC

## 2021-08-28 MED ORDER — METFORMIN HCL 500 MG PO TABS: 500.0000 mg | ORAL_TABLET | Freq: Every day | ORAL | 0 refills | Status: DC

## 2021-09-04 NOTE — Progress Notes (Signed)
Chief Complaint:   OBESITY Sally [name] is here to discuss her progress with her obesity treatment plan along with follow-up of her obesity related diagnoses. Sally [name] is on following a lower carbohydrate, vegetable and lean protein rich diet plan and states she is following her eating plan approximately 40% of the time. Sally [name] states she is doing 0 minutes 0 times per week.  Today's visit was #: 40 Starting weight: 273 lbs Starting date: 08/07/2020 Today's weight: 262 lbs Today's date: 08/28/2021 Total lbs lost to date: 11 Total lbs lost since last in-office visit: 0  Interim History: Sally [name] was on vacation since her last visit. She is working on increasing protein and portion controlling, but she feels she is ready to get back to a structured plan.   Subjective:   1. Pre-diabetes Sally [name] is taking metformin 500 mg daily.   Assessment/Plan:   1. Pre-diabetes We will refill metformin for 1 month. Sally [name] will continue to work on weight loss, exercise, and decreasing simple carbohydrates to help decrease the risk of diabetes.   - metFORMIN (GLUCOPHAGE) 500 MG tablet; Take 1 tablet (500 mg total) by mouth daily with breakfast.  Dispense: 30 tablet; Refill: 0  2. Obesity, Current BMI 39.9 Sally [name] is currently in the action stage of change. As such, her goal is to continue with weight loss efforts. She has agreed to following a lower carbohydrate, vegetable and lean protein rich diet plan.   Behavioral modification strategies: increasing lean protein intake.  Sally [name] has agreed to follow-up with our clinic in 3 weeks. She was informed of the importance of frequent follow-up visits to maximize her success with intensive lifestyle modifications for her multiple health conditions.   Objective:   Blood pressure 124/76, pulse 77, temperature 98 F (36.7 C), height '5\' 8"'$  (1.727 m), weight 262 lb (118.8 kg), last menstrual period 10/26/2015, SpO2 98 %. Body mass index is 39.84 kg/m.  General:  Cooperative, alert, well developed, in no acute distress. HEENT: Conjunctivae and lids unremarkable. Cardiovascular: Regular rhythm.  Lungs: Normal work of breathing. Neurologic: No focal deficits.   Lab Results  Component Value Date   CREATININE 0.72 05/06/2021   BUN 13 05/06/2021   NA 141 05/06/2021   K 4.6 05/06/2021   CL 106 05/06/2021   CO2 24 05/06/2021   Lab Results  Component Value Date   ALT 17 05/06/2021   AST 17 05/06/2021   ALKPHOS 92 05/06/2021   BILITOT 0.2 05/06/2021   Lab Results  Component Value Date   HGBA1C 5.5 05/06/2021   HGBA1C 5.3 08/07/2020   HGBA1C 5.3 04/03/2015   Lab Results  Component Value Date   INSULIN 4.2 05/06/2021   INSULIN 4.9 08/07/2020   Lab Results  Component Value Date   TSH 0.010 (L) 05/06/2021   Lab Results  Component Value Date   CHOL 151 05/06/2021   HDL 44 05/06/2021   LDLCALC 92 05/06/2021   TRIG 75 05/06/2021   CHOLHDL 5 09/03/2016   Lab Results  Component Value Date   VD25OH 45.4 05/06/2021   VD25OH 34 01/08/2016   VD25OH 21 (L) 10/19/2015   Lab Results  Component Value Date   WBC 8.9 10/20/2018   HGB 15.1 (H) 10/20/2018   HCT 44.5 10/20/2018   MCV 98.0 10/20/2018   PLT 194 10/20/2018   Lab Results  Component Value Date   FERRITIN 67 07/27/2014   Attestation Statements:   Reviewed by clinician on day of visit: allergies, medications, problem list,  medical history, surgical history, family history, social history, and previous encounter notes.   I, Trixie Dredge, am acting as transcriptionist for [name], MD.  I have reviewed the above documentation for accuracy and completeness, and I agree with the above. -  [name], MD

## 2021-09-08 ENCOUNTER — Emergency Department (HOSPITAL_BASED_OUTPATIENT_CLINIC_OR_DEPARTMENT_OTHER)
Admission: EM | Admit: 2021-09-08 | Discharge: 2021-09-08 | Disposition: A | Discharge status: Home / Self Care | Payer: Medicare Other | Attending: Emergency Medicine | Admitting: Emergency Medicine

## 2021-09-08 ENCOUNTER — Encounter (HOSPITAL_BASED_OUTPATIENT_CLINIC_OR_DEPARTMENT_OTHER): Payer: Self-pay

## 2021-09-08 ENCOUNTER — Emergency Department (HOSPITAL_BASED_OUTPATIENT_CLINIC_OR_DEPARTMENT_OTHER): Payer: Medicare Other

## 2021-09-08 ENCOUNTER — Other Ambulatory Visit: Payer: Self-pay

## 2021-09-08 DIAGNOSIS — W19XXXA Unspecified fall, initial encounter: Secondary | ICD-10-CM

## 2021-09-08 DIAGNOSIS — Z20822 Contact with and (suspected) exposure to covid-19: Secondary | ICD-10-CM | POA: Diagnosis not present

## 2021-09-08 DIAGNOSIS — R52 Pain, unspecified: Secondary | ICD-10-CM

## 2021-09-08 DIAGNOSIS — Z7982 Long term (current) use of aspirin: Secondary | ICD-10-CM | POA: Insufficient documentation

## 2021-09-08 DIAGNOSIS — M25571 Pain in right ankle and joints of right foot: Secondary | ICD-10-CM | POA: Insufficient documentation

## 2021-09-08 DIAGNOSIS — X501XXA Overexertion from prolonged static or awkward postures, initial encounter: Secondary | ICD-10-CM | POA: Diagnosis not present

## 2021-09-08 LAB — CBC WITH DIFFERENTIAL/PLATELET
Abs Immature Granulocytes: 0.01 10*3/uL (ref 0.00–0.07)
Basophils Absolute: 0.1 10*3/uL (ref 0.0–0.1)
Basophils Relative: 1 %
Eosinophils Absolute: 0.3 10*3/uL (ref 0.0–0.5)
Eosinophils Relative: 3 %
HCT: 43.6 % (ref 36.0–46.0)
Hemoglobin: 14.2 g/dL (ref 12.0–15.0)
Immature Granulocytes: 0 %
Lymphocytes Relative: 42 %
Lymphs Abs: 3.4 10*3/uL (ref 0.7–4.0)
MCH: 31.1 pg (ref 26.0–34.0)
MCHC: 32.6 g/dL (ref 30.0–36.0)
MCV: 95.4 fL (ref 80.0–100.0)
Monocytes Absolute: 0.5 10*3/uL (ref 0.1–1.0)
Monocytes Relative: 7 %
Neutro Abs: 3.8 10*3/uL (ref 1.7–7.7)
Neutrophils Relative %: 47 %
Platelets: 256 10*3/uL (ref 150–400)
RBC: 4.57 MIL/uL (ref 3.87–5.11)
RDW: 13.7 % (ref 11.5–15.5)
WBC: 8.1 10*3/uL (ref 4.0–10.5)
nRBC: 0 % (ref 0.0–0.2)

## 2021-09-08 LAB — BASIC METABOLIC PANEL
Anion gap: 7 (ref 5–15)
BUN: 13 mg/dL (ref 6–20)
CO2: 27 mmol/L (ref 22–32)
Calcium: 9 mg/dL (ref 8.9–10.3)
Chloride: 107 mmol/L (ref 98–111)
Creatinine, Ser: 0.73 mg/dL (ref 0.44–1.00)
GFR, Estimated: >60 mL/min (ref >60)
Glucose, Bld: 89 mg/dL (ref 70–99)
Potassium: 4.1 mmol/L (ref 3.5–5.1)
Sodium: 141 mmol/L (ref 135–145)

## 2021-09-08 LAB — CK: Total CK: 56 U/L (ref 38–234)

## 2021-09-08 LAB — SARS CORONAVIRUS 2 BY RT PCR: SARS Coronavirus 2 by RT PCR: NEGATIVE

## 2021-09-08 MED ORDER — HYDROMORPHONE HCL 1 MG/ML IJ SOLN
1.0000 mg | Freq: Once | INTRAMUSCULAR | Status: AC
  Administered 2021-09-08: 1 mg via INTRAVENOUS
  Filled 2021-09-08: qty 1

## 2021-09-08 MED ORDER — KETOROLAC TROMETHAMINE 30 MG/ML IJ SOLN
30.0000 mg | Freq: Once | INTRAMUSCULAR | Status: AC
  Administered 2021-09-08: 30 mg via INTRAVENOUS
  Filled 2021-09-08: qty 1

## 2021-09-08 MED ORDER — HYDROCODONE-ACETAMINOPHEN 5-325 MG PO TABS: 2.0000 | ORAL_TABLET | Freq: Once | ORAL | Status: DC

## 2021-09-08 NOTE — ED Triage Notes (Signed)
Pt states that she has a chronic pain condition that has worsened and reports severe generalized pain. Pt also reports that she tripped and twisted her L ankle and wants it checked as well.

## 2021-09-08 NOTE — Discharge Instructions (Addendum)
Make sure to follow-up with your pain clinic tomorrow  Return for new or worsening symptoms

## 2021-09-08 NOTE — ED Provider Notes (Signed)
[facility] Provider Note   CSN: [id number] Arrival date & time: 09/08/21  1619     History  Chief Complaint  Patient presents with   GENERALIZED PAIN   Ankle Pain    Sally [name] is a 46 y.o. female for evaluation of generalized pain.  She has history of CRPS.  Did have a fall 2 days ago which is a mechanical fall.  She states she has been hurting all over.  She denies hitting her head, LOC or anticoagulation.  She has a cough.  No fever, known sick contacts.  She is followed by pain management.  Her pain today is worse.  No chest pain, shortness of breath, abdominal pain, diarrhea, dysuria.  She feels generally weak from the pain  HPI     Home Medications Prior to Admission medications   Medication Sig Start Date End Date Taking? Authorizing Provider  Aspirin-Acetaminophen-Caffeine (EXCEDRIN MIGRAINE PO) Take by mouth. prn    [provider]  Buprenorphine HCl (BELBUCA) 150 MCG FILM Place 150 mg inside cheek at bedtime.    [provider]  buPROPion (WELLBUTRIN SR) 200 MG 12 hr tablet Take 200 mg by mouth 2 (two) times daily.    [provider]  conjugated estrogens (PREMARIN) vaginal cream 1/2 gram vaginally twice weekly 09/13/18   [name], MD  hydroxypropyl methylcellulose / hypromellose (ISOPTO TEARS / GONIOVISC) 2.5 % ophthalmic solution Place 1 drop into both eyes 3 (three) times daily as needed for dry eyes.    [provider]  ketorolac (TORADOL) 10 MG tablet Take 10 mg by mouth as needed.    [provider]  levothyroxine (SYNTHROID) 175 MCG tablet Take 175 mcg by mouth daily. 02/19/21   [provider]  liothyronine (CYTOMEL) 5 MCG tablet Take 3 tablets (15 mcg total) by mouth daily. Take 2 tablets at breakfast and take one tablet at lunch Patient taking differently: Take 5-10 mcg by mouth See admin instructions. Take 10 mcg by mouth in the morning and 5 mcg in the evening  09/17/16   Elayne Snare, MD  metFORMIN (GLUCOPHAGE) 500 MG tablet Take 1 tablet (500 mg total) by mouth daily with breakfast. 08/28/21   [name] D, MD  methocarbamol (ROBAXIN) 500 MG tablet Take 500 mg by mouth every 8 (eight) hours as needed for muscle spasms.    [provider]  NONFORMULARY OR COMPOUNDED ITEM Estradiol cream 0.02% in 9m prefilled syringes.  166mpv twice weekly.  #[id number]. Patient taking differently: Estradiol cream 0.02% in 1ml9mefilled syringes.  1ml 20mtwice weekly.  #[id number]. 09/29/18   MillerMegan Salonomeprazole (PRILOSEC) 40 MG capsule Take 40 mg by mouth daily.    [provider]  oxyCODONE ER (XTAMPZA ER) 18 MG C12A Take 10 mg by mouth every 6 (six) hours as needed.    [provider]  pantoprazole (PROTONIX) 40 MG tablet Take 40 mg by mouth daily.    [provider]  polyethylene glycol (MIRALAX / GLYCOLAX) 17 g packet Take 17 g by mouth daily.    [provider]  Semaglutide-Weight Management ([facility]/0.5ML SOAJ Every 10 days 07/01/21   [name]  Sennosides ([facility] STRENGTH) 17.2 MG TABS Take 17.2 mg by mouth daily.    [provider]  SUMAtriptan (IMITREX) 25 MG tablet Take 25 mg by mouth at bedtime as needed for migraine. 08/09/18   [provider]  Testosterone 10 MG/ACT (2%) GEL Apply 1 Pump topically at bedtime. 09/06/18   [provider]  venlafaxine XR (EFFEXOR-XR) 75 MG 24 hr capsule Take 75 mg by mouth daily with breakfast.    [provider]  Vitamin D, Ergocalciferol, (DRISDOL) 1.25 MG (50000 UNIT) CAPS capsule TAKE ONE CAPSULE EVERY 7 DAYS 06/25/21   [name] D, MD      Allergies    Paroxetine hcl, Cymbalta [duloxetine hcl], Duloxetine, Percocet [oxycodone-acetaminophen], and Codeine    Review of Systems   Review of Systems  Constitutional: Negative.   HENT: Negative.    Respiratory: Negative.    Cardiovascular: Negative.    Gastrointestinal: Negative.   Genitourinary: Negative.   Musculoskeletal:        Tenderness to right ankle   Skin: Negative.   Neurological:  Positive for weakness.  All other systems reviewed and are negative.   Physical Exam Updated Vital Signs BP 123/85   Pulse 86   Temp 98.4 F (36.9 C) (Oral)   Resp 17   Ht '5\' 8"'$  (1.727 m)   Wt 120.2 kg   LMP 10/26/2015 (Exact Date)   SpO2 96%   BMI 40.29 kg/m  Physical Exam Vitals and nursing note reviewed.  Constitutional:      General: She is not in acute distress.    Appearance: She is well-developed. She is not ill-appearing, toxic-appearing or diaphoretic.  HENT:     Head: Normocephalic and atraumatic.     Nose: Nose normal.     Mouth/Throat:     Mouth: Mucous membranes are moist.  Eyes:     Pupils: Pupils are equal, round, and reactive to light.  Cardiovascular:     Rate and Rhythm: Normal rate and regular rhythm.     Pulses: Normal pulses.          Radial pulses are 2+ on the right side and 2+ on the left side.     Heart sounds: Normal heart sounds.  Pulmonary:     Effort: Pulmonary effort is normal. No respiratory distress.     Breath sounds: Normal breath sounds.  Abdominal:     General: Bowel sounds are normal. There is no distension.     Palpations: Abdomen is soft.     Tenderness: There is no abdominal tenderness. There is no guarding or rebound.  Musculoskeletal:        General: Normal range of motion.     Cervical back: Normal range of motion and neck supple.     Comments: Diffuse tenderness right ankle, no overlying skin changes.  Able to plantarflex and dorsiflex that difficulty, nontender tib-fib, foot.  No midline C/T/L tenderness.  Skin:    General: Skin is warm and dry.  Neurological:     General: No focal deficit present.     Mental Status: She is alert and oriented to person, place, and time.    ED Results / Procedures / Treatments   Labs (all labs ordered are listed, but only abnormal results  are displayed) Labs Reviewed  SARS CORONAVIRUS 2 BY RT PCR  CBC WITH DIFFERENTIAL/PLATELET  BASIC METABOLIC PANEL  CK    EKG None  Radiology DG Chest Portable 1 View  Result Date: 09/08/2021 CLINICAL DATA:  Cough. EXAM: PORTABLE CHEST 1 VIEW COMPARISON:  10/20/2018 FINDINGS: Lung volumes are low.The cardiomediastinal contours are normal. Minor bibasilar atelectasis. Pulmonary vasculature is normal. No consolidation, pleural effusion, or pneumothorax. No acute osseous abnormalities are seen. IMPRESSION: Low lung volumes with  minor bibasilar atelectasis. Electronically Signed   By: Keith Rake M.D.   On: 09/08/2021 19:15   DG Ankle Complete Left  Result Date: 09/08/2021 CLINICAL DATA:  Tripped and twisted her left ankle. EXAM: LEFT ANKLE COMPLETE - 3+ VIEW COMPARISON:  None Available. FINDINGS: There is no evidence of fracture, dislocation, or joint effusion. There is no evidence of arthropathy or other focal bone abnormality. Soft tissues are unremarkable. IMPRESSION: Negative. Electronically Signed   By: Keane Police D.O.   On: 09/08/2021 18:16    Procedures Procedures    Medications Ordered in ED Medications  HYDROmorphone (DILAUDID) injection 1 mg (1 mg Intravenous Given 09/08/21 1908)    ED Course/ Medical Decision Making/ A&P    46 year old here for evaluation of diffuse pain.  Had mechanical fall 2 days ago.  Has had some ankle pain since.  History of CRPS followed by pain management.  She denies hitting her head, LOC or anticoagulation.  She also admits to a cough and generalized weakness.  No sick contacts.  Her heart and lungs are clear.  Abdomen soft, nontender.  She has a nonfocal neuro exam without deficits.  Has some mild generalized tenderness about her right ankle however no edema, erythema, warmth or obvious effusion.  Nontender bilateral feet, tib-fib.  Suspect sprain.  We will plan on labs and imaging  Labs and imaging personally viewed and interpreted:  X-ray  ankle without fracture, dislocation, effusion X-ray chest without infiltrates, cardiomegaly, pulm edema, pneumothorax CBC without leukocytosis BMP without significant abnormality CK 56 COVID neg  Patient reassessed.  And controlled.  She likely has acute on chronic pain brought on by her recent fall.  Given she is on a pain contract will not DC home on opiate medication I did discuss nonopioid pain medication should he has muscle relaxers and NSAIDs at home.  She will follow-up with her pain clinic tomorrow.  Discussed return for new or worsening symptoms  The patient has been appropriately medically screened and/or stabilized in the ED. I have low suspicion for any other emergent medical condition which would require further screening, evaluation or treatment in the ED or require inpatient management.  Patient is hemodynamically stable and in no acute distress.  Patient able to ambulate in department prior to ED.  Evaluation does not show acute pathology that would require ongoing or additional emergent interventions while in the emergency department or further inpatient treatment.  I have discussed the diagnosis with the patient and answered all questions.  Pain is been managed while in the emergency department and patient has no further complaints prior to discharge.  Patient is comfortable with plan discussed in room and is stable for discharge at this time.  I have discussed strict return precautions for returning to the emergency department.  Patient was encouraged to follow-up with PCP/specialist refer to at discharge.                            Medical Decision Making Amount and/or Complexity of Data Reviewed External Data Reviewed: labs, radiology and notes. Labs: ordered. Decision-making details documented in ED Course. Radiology: ordered and independent interpretation performed. Decision-making details documented in ED Course.  Risk OTC drugs. Prescription drug  management. Parenteral controlled substances. Decision regarding hospitalization. Diagnosis or treatment significantly limited by social determinants of health.         Final Clinical Impression(s) / ED Diagnoses Final diagnoses:  Fall, initial encounter  Pain  Rx / DC Orders ED Discharge Orders     None         Hazelee Harbold A, PA-C 09/08/21 2028    Regan Lemming, MD 09/08/21 2201

## 2021-09-18 ENCOUNTER — Ambulatory Visit (INDEPENDENT_AMBULATORY_CARE_PROVIDER_SITE_OTHER): Payer: Medicare Other | Admitting: Family Medicine

## 2021-09-18 ENCOUNTER — Encounter (INDEPENDENT_AMBULATORY_CARE_PROVIDER_SITE_OTHER): Payer: Self-pay | Admitting: Family Medicine

## 2021-09-18 VITALS — BP 134/80 | HR 81 | Temp 97.9°F | Ht 68.0 in | Wt 266.0 lb

## 2021-09-18 DIAGNOSIS — E559 Vitamin D deficiency, unspecified: Secondary | ICD-10-CM

## 2021-09-18 DIAGNOSIS — Z6841 Body Mass Index (BMI) 40.0 and over, adult: Secondary | ICD-10-CM | POA: Diagnosis not present

## 2021-09-18 DIAGNOSIS — E8881 Metabolic syndrome: Secondary | ICD-10-CM

## 2021-09-18 DIAGNOSIS — E669 Obesity, unspecified: Secondary | ICD-10-CM | POA: Diagnosis not present

## 2021-09-18 MED ORDER — METFORMIN HCL 500 MG PO TABS: 500.0000 mg | ORAL_TABLET | Freq: Two times a day (BID) | ORAL | 0 refills | Status: DC

## 2021-09-18 MED ORDER — VITAMIN D (ERGOCALCIFEROL) 1.25 MG (50000 UNIT) PO CAPS: ORAL_CAPSULE | ORAL | 0 refills | Status: DC

## 2021-09-25 NOTE — Progress Notes (Signed)
Chief Complaint:   OBESITY Sally [name] is here to discuss her progress with her obesity treatment plan along with follow-up of her obesity related diagnoses. Sally [name] is on following a lower carbohydrate, vegetable and lean protein rich diet plan and states she is following her eating plan approximately 30% of the time. Sally [name] states she is doing 0 minutes 0 times per week.  Today's visit was #: 55 Starting weight: 273 lbs Starting date: 08/07/2020 Today's weight: 266 lbs Today's date: 09/18/2021 Total lbs lost to date: 7 Total lbs lost since last in-office visit: 0  Interim History: Sally [name] has had a lot going on with her family and her health. She is retaining some water weight and she has done well with minimizing simple carbohydrates.   Subjective:   1. Insulin resistance Sally [name] is still struggling with PM polyphagia. No side effects were noted.   2. Vitamin D deficiency Sally [name] is stable on Vitamin D, but her level is not yet at goal.   Assessment/Plan:   1. Insulin resistance Sally [name] agreed to increase metformin to 500 mg BID with meals, and we will refill for 1 month.  - metFORMIN (GLUCOPHAGE) 500 MG tablet; Take 1 tablet (500 mg total) by mouth 2 (two) times daily with a meal.  Dispense: 60 tablet; Refill: 0  2. Vitamin D deficiency We will refill prescription Vitamin D for 1 month. Sally [name] will follow-up for routine testing of Vitamin D, at least 2-3 times per year to avoid over-replacement.  - Vitamin D, Ergocalciferol, (DRISDOL) 1.25 MG (50000 UNIT) CAPS capsule; TAKE ONE CAPSULE EVERY 7 DAYS  Dispense: 4 capsule; Refill: 0  3. Obesity, Current BMI 40.5 Sally [name] is currently in the action stage of change. As such, her goal is to continue with weight loss efforts. She has agreed to keeping a food journal and adhering to recommended goals of 1400-1500 calories and 85+ grams of protein daily or following a lower carbohydrate, vegetable and lean protein rich diet plan.   Exercise goals:  All adults should avoid inactivity. Some physical activity is better than none, and adults who participate in any amount of physical activity gain some health benefits.  Behavioral modification strategies: increasing lean protein intake and meal planning and cooking strategies.  Sally [name] has agreed to follow-up with our clinic in 3 weeks. She was informed of the importance of frequent follow-up visits to maximize her success with intensive lifestyle modifications for her multiple health conditions.   Objective:   Blood pressure 134/80, pulse 81, temperature 97.9 F (36.6 C), height '5\' 8"'$  (1.727 m), weight 266 lb (120.7 kg), last menstrual period 10/26/2015, SpO2 97 %. Body mass index is 40.45 kg/m.  General: Cooperative, alert, well developed, in no acute distress. HEENT: Conjunctivae and lids unremarkable. Cardiovascular: Regular rhythm.  Lungs: Normal work of breathing. Neurologic: No focal deficits.   Lab Results  Component Value Date   CREATININE 0.73 09/08/2021   BUN 13 09/08/2021   NA 141 09/08/2021   K 4.1 09/08/2021   CL 107 09/08/2021   CO2 27 09/08/2021   Lab Results  Component Value Date   ALT 17 05/06/2021   AST 17 05/06/2021   ALKPHOS 92 05/06/2021   BILITOT 0.2 05/06/2021   Lab Results  Component Value Date   HGBA1C 5.5 05/06/2021   HGBA1C 5.3 08/07/2020   HGBA1C 5.3 04/03/2015   Lab Results  Component Value Date   INSULIN 4.2 05/06/2021   INSULIN 4.9 08/07/2020   Lab Results  Component  Value Date   TSH 0.010 (L) 05/06/2021   Lab Results  Component Value Date   CHOL 151 05/06/2021   HDL 44 05/06/2021   LDLCALC 92 05/06/2021   TRIG 75 05/06/2021   CHOLHDL 5 09/03/2016   Lab Results  Component Value Date   VD25OH 45.4 05/06/2021   VD25OH 34 01/08/2016   VD25OH 21 (L) 10/19/2015   Lab Results  Component Value Date   WBC 8.1 09/08/2021   HGB 14.2 09/08/2021   HCT 43.6 09/08/2021   MCV 95.4 09/08/2021   PLT 256 09/08/2021   Lab Results   Component Value Date   FERRITIN 67 07/27/2014    Obesity Behavioral Intervention:   Approximately 15 minutes were spent on the discussion below.  ASK: We discussed the diagnosis of obesity with Sally [name] today and Sally [name] agreed to give [place] permission to discuss obesity behavioral modification therapy today.  ASSESS: Sally [name] has the diagnosis of obesity and her BMI today is 40.5. Sally [name] is in the action stage of change.   ADVISE: Sally [name] was educated on the multiple health risks of obesity as well as the benefit of weight loss to improve her health. She was advised of the need for long term treatment and the importance of lifestyle modifications to improve her current health and to decrease her risk of future health problems.  AGREE: Multiple dietary modification options and treatment options were discussed and Sally [name] agreed to follow the recommendations documented in the above note.  ARRANGE: Sally [name] was educated on the importance of frequent visits to treat obesity as outlined per CMS and USPSTF guidelines and agreed to schedule her next follow up appointment today.  Attestation Statements:   Reviewed by clinician on day of visit: allergies, medications, problem list, medical history, surgical history, family history, social history, and previous encounter notes.   I, Sally [name], am acting as Location manager for [name], MD.  I have reviewed the above documentation for accuracy and completeness, and I agree with the above. -  [name], MD

## 2021-10-17 ENCOUNTER — Ambulatory Visit (INDEPENDENT_AMBULATORY_CARE_PROVIDER_SITE_OTHER): Payer: Medicare Other | Admitting: Family Medicine

## 2021-10-17 ENCOUNTER — Encounter (INDEPENDENT_AMBULATORY_CARE_PROVIDER_SITE_OTHER): Payer: Self-pay | Admitting: Family Medicine

## 2021-10-17 VITALS — BP 114/75 | HR 76 | Temp 98.1°F | Ht 68.0 in | Wt 270.0 lb

## 2021-10-17 DIAGNOSIS — R7303 Prediabetes: Secondary | ICD-10-CM

## 2021-10-17 DIAGNOSIS — E669 Obesity, unspecified: Secondary | ICD-10-CM | POA: Diagnosis not present

## 2021-10-17 DIAGNOSIS — F3289 Other specified depressive episodes: Secondary | ICD-10-CM | POA: Diagnosis not present

## 2021-10-17 DIAGNOSIS — Z6841 Body Mass Index (BMI) 40.0 and over, adult: Secondary | ICD-10-CM

## 2021-10-17 DIAGNOSIS — E66813 Obesity, class 3: Secondary | ICD-10-CM

## 2021-10-17 MED ORDER — TOPIRAMATE 25 MG PO TABS: 25.0000 mg | ORAL_TABLET | Freq: Every day | ORAL | 0 refills | Status: DC
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
## 2021-10-22 ENCOUNTER — Ambulatory Visit (INDEPENDENT_AMBULATORY_CARE_PROVIDER_SITE_OTHER): Payer: Medicare Other | Admitting: Psychology

## 2021-10-22 ENCOUNTER — Encounter: Payer: No Typology Code available for payment source | Attending: Psychology | Admitting: Psychology

## 2021-10-22 DIAGNOSIS — M797 Fibromyalgia: Secondary | ICD-10-CM | POA: Diagnosis not present

## 2021-10-22 DIAGNOSIS — F419 Anxiety disorder, unspecified: Secondary | ICD-10-CM | POA: Insufficient documentation

## 2021-10-22 DIAGNOSIS — G90513 Complex regional pain syndrome I of upper limb, bilateral: Secondary | ICD-10-CM | POA: Insufficient documentation

## 2021-10-22 DIAGNOSIS — G894 Chronic pain syndrome: Secondary | ICD-10-CM | POA: Diagnosis not present

## 2021-10-22 DIAGNOSIS — F32A Depression, unspecified: Secondary | ICD-10-CM | POA: Insufficient documentation

## 2021-10-22 NOTE — Progress Notes (Signed)
Chief Complaint:   OBESITY Sally [name] is here to discuss her progress with her obesity treatment plan along with follow-up of her obesity related diagnoses. Sally [name] is on keeping a food journal and adhering to recommended goals of 1400-1500 calories and 85+ grams of protein daily and states she is following her eating plan approximately 25% of the time. Sally [name] states she is doing 0 minutes 0 times per week.  Today's visit was #: 61 Starting weight: 273 lbs Starting date: 08/07/2020 Today's weight: 270 lbs Today's date: 10/17/2021 Total lbs lost to date: 3 Total lbs lost since last in-office visit: 0  Interim History: Sally [name] reports more difficulty with pain management lately and she had to go to the ER lately. She has not been able to follow her plan as well due to this. She feels like she is having more GI issues and diarrhea on metformin. She did not tolerate GLP-1 well in the past.   Subjective:   1. Pre-diabetes Sally [name] notes GI upset and diarrhea, and she feels this is related to metformin with increased dose.  2. Other depression with emotional eating Sally [name] is on Wellbutrin 200 mg twice daily.  She denies a history of kidney stones.  We discussed starting Topamax for cravings and discussed possible side effects.  She is agreeable to trying.  She is status post hysterectomy.  Assessment/Plan:   1. Pre-diabetes Sally [name] agreed to decrease metformin to 500 mg once daily. She will continue with her diet and exercise. We will follow-up at her next visit.   2. Other depression with emotional eating Sally [name] agreed to start Topamax 25 mg nightly with no refills.  We will refer to Dr. Mallie Mussel, our bariatric psychologist for evaluation.  - topiramate (TOPAMAX) 25 MG tablet; Take 1 tablet (25 mg total) by mouth at bedtime.  Dispense: 30 tablet; Refill: 0  3. Obesity, Current BMI 41.1 Sally [name] is currently in the action stage of change. As such, her goal is to continue with weight loss efforts. She has  agreed to keeping a food journal and adhering to recommended goals of 1400-1500 calories and 85+ grams of protein daily.   Exercise goals: All adults should avoid inactivity. Some physical activity is better than none, and adults who participate in any amount of physical activity gain some health benefits.  Behavioral modification strategies: increasing lean protein intake and decreasing simple carbohydrates.  Sally [name] has agreed to follow-up with our clinic in 4 weeks. She was informed of the importance of frequent follow-up visits to maximize her success with intensive lifestyle modifications for her multiple health conditions.   Objective:   Blood pressure 114/75, pulse 76, temperature 98.1 F (36.7 C), height '5\' 8"'$  (1.727 m), weight 270 lb (122.5 kg), last menstrual period 10/26/2015, SpO2 98 %. Body mass index is 41.05 kg/m.  General: Cooperative, alert, well developed, in no acute distress. HEENT: Conjunctivae and lids unremarkable. Cardiovascular: Regular rhythm.  Lungs: Normal work of breathing. Neurologic: No focal deficits.   Lab Results  Component Value Date   CREATININE 0.73 09/08/2021   BUN 13 09/08/2021   NA 141 09/08/2021   K 4.1 09/08/2021   CL 107 09/08/2021   CO2 27 09/08/2021   Lab Results  Component Value Date   ALT 17 05/06/2021   AST 17 05/06/2021   ALKPHOS 92 05/06/2021   BILITOT 0.2 05/06/2021   Lab Results  Component Value Date   HGBA1C 5.5 05/06/2021   HGBA1C 5.3 08/07/2020   HGBA1C 5.3 04/03/2015  Lab Results  Component Value Date   INSULIN 4.2 05/06/2021   INSULIN 4.9 08/07/2020   Lab Results  Component Value Date   TSH 0.010 (L) 05/06/2021   Lab Results  Component Value Date   CHOL 151 05/06/2021   HDL 44 05/06/2021   LDLCALC 92 05/06/2021   TRIG 75 05/06/2021   CHOLHDL 5 09/03/2016   Lab Results  Component Value Date   VD25OH 45.4 05/06/2021   VD25OH 34 01/08/2016   VD25OH 21 (L) 10/19/2015   Lab Results  Component Value  Date   WBC 8.1 09/08/2021   HGB 14.2 09/08/2021   HCT 43.6 09/08/2021   MCV 95.4 09/08/2021   PLT 256 09/08/2021   Lab Results  Component Value Date   FERRITIN 67 07/27/2014   Attestation Statements:   Reviewed by clinician on day of visit: allergies, medications, problem list, medical history, surgical history, family history, social history, and previous encounter notes.   I, Trixie Dredge, am acting as transcriptionist for [name], MD.  I have reviewed the above documentation for accuracy and completeness, and I agree with the above. -  [name], MD

## 2021-10-23 ENCOUNTER — Encounter: Payer: Self-pay | Admitting: Psychology

## 2021-10-23 NOTE — Progress Notes (Signed)
Patient:  Sally [name]   DOB: 11-13-1975  MR Number: [id number]  Location: [facility] [place] [facility] [place], [address] [id number] [address] [place] [id number] Dept: [phone]  Start: 9 AM  End: 10 AM  .  Today's visit was an in person visit is conducted in my outpatient clinic office with the patient and myself present in the office.   Provider/Observer:     Edgardo Roys PsyD  Chief Complaint:      Chief Complaint  Patient presents with   Anxiety   Depression   Pain   Neck Pain    Reason For Service:     Sally [name] is a 46 year old female referred by [facility] for therapeutic interventions due to chronic pain symptoms.  The patient reports that she has great difficulty standing, walking for long times, elbow pain, migraines, hip pain, shoulder and neck pain.  The patient fell at work and fractured both her elbows and also had knee replacement surgery.  The patient has been taking maintenance doses of opioid pain medications for some time.  The patient reports that she has a great deal of difficulty functioning and has very poor sleep and wakes up numerous times each night.  The patient also has dealt with depression and anxiety for many years and has been hospitalized for depression anxiety years ago.  She has been receiving counseling for anxiety and depression and takes Trintellix.   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   The patient describes chronic issues of depression anxiety but the development of severe chronic pain.  The patient reports that she is  continuing to have some pain in her back post surgery and had a recent fall.  Follow-up MRI did not suggest any structural damage or injury from this fall.  The patient reports that her symptoms associated with complex regional pain syndrome have worsened recently.  11/01/2019: The patient is continued to have significant pain and distress with worsening episodes of her complex regional pain syndrome and fibromyalgia type symptoms.  The patient has been very frustrated at times with her lack of ability to function and do daily activities.  01/03/2020: The patient reports that she is continued to have significant pain but has particularly had worsening of her fatigue and somnolence.  She reports that she feels like she is sleeping an adequate amount of time but is very tired and fatigued throughout the day.  02/28/2020: The patient reports that she has had some worsening in her significant pain symptoms with cold and atmospheric changes.  She continues to have significant issues with her upper extremities related to complex regional pain syndrome and fibromyalgia.  The patient also reports that she has had significant psychosocial stressors with her closest friend having terminal cancer and being given 3 months to live.  The patient and her friend are talking about the patient taking over care of the patient's granddaughter and the patient also feels a great deal of need to do what ever she can for her friend going forward.  This is a very difficult and stressful situation for the patient on  top of her numerous medical and pain issues.  The patient reports that her sleep continues to be improved but limited with pain disrupting sleep patterns.  The patient has been followed by Dr. Wess Botts MD [facility]) for some time for pain management and she is now been referred for an evaluation for spinal cord stimulator trialing and possible implantation as well as being scheduled for ketamine infusions.  They  are still working on Designer, television/film set. coverage for the ketamine infusions.  I think that the patient is an excellent candidate for both of these possible options.  The patient and I talked about the appropriateness for ketamine infusions due to her wide spectrum of issues for some time now and I am quite pleased to find out that her treating pain specialist's clinic is now doing ketamine infusions that she appears to be an excellent candidate.  She also appears to be an excellent candidate for spinal cord stimulator trialing and possible implantation.  03/27/2020: The patient reports that she has gone through all of the preliminary work-up to have a spinal cord stimulator trial conducted and is looking forward with hope that it will help with her significant cervical related pain.  The patient is also has an initial appointment to assess for possible ketamine infusion therapies as well.  We reviewed issues related to both the spinal cord stimulator and ketamine infusion with the patient as well as worked on therapeutic interventions around her chronic pain/complex regional pain syndrome diagnoses.  The patient reports that she still has significant psychosocial stressors with her closest friend dying from breast cancer and in the last month or 2 of her life.  At this point, the patient does not look like she will have to take over her friend's granddaughters care as the family is hoping that an uncle of the young girl will take over care.  04/24/2020: The patient returns reporting that she has gotten her spinal cord stimulator trialing device and has it implanted currently.  However, there have been mechanical problems with the device and it has been shutting off independently and the patient has not been able to get a good reading on how much it has helped her.  The patient reports that she did experience some brief improvement when she knew that it was on but it would turn off.  She is gone back to  the device maker and they could not figure out the problem.  They are going to put a new external device in and extend the spinal cord stimulator trial in face for 2 days.  The patient has also had her first ketamine infusion trial.  She reports that she was told she was hallucinating and dissociative during this phase and experienced some improvement over the rest of the day as far as her mood and pain symptoms but it was over within 24 hours.  The patient is scheduled to have her next ketamine infusion coming up.  08/07/2020: The patient reports that they are looking at doing a new spinal cord stimulator trial as the device did not work properly during her 7-day trial.  In fact, they attempted to extended to 10 days hoping to get it working correctly.  However, the patient reports that she felt like it did help the first day when it was apparently working but shortly stopped working and it sounds like there were never able to get it working properly.  The patient reports that at this point, her anesthesiologist/pain specialist  does not think that she should have any more ketamine trials but the patient would like to try them again.  The patient reports that the doctor felt that she had an adverse reaction to it but her husband was present and did not describe any severe reaction.  At this point, she is planning to talk to him again about possibly having another ketamine infusion if possible.  The patient reports that she continues with her significant chronic pain symptoms but she is managing day-to-day.  She reports that there continues to be a major stressor in her life relative to a very close friend who has terminal cancer and the cancer is continuing to progress.  09/11/2020: The patient reports that her friend is now being managed by hospice and appears to be at the end of her life.  This has been stressful but the patient has been able to adjust to the impending reality with time and she feels like she is  handling it fairly well.  The patient reports that she continues to have significant pain in her arms radiating from her neck.  The patient reports that at this point she has not been able to return for ketamine therapies and not discussed strategies to follow-up with [facility].  11/06/2020: The patient has continued to have a lot of stress with depression and anxiety worsening particular around the impending death of a very close friend.  The friend is dying from metastatic cancer and the patient is one of the primary helpers for her friend.  The patient is very close to this friend.  The patient interacts with hospice and is spending a lot of time helping.  This is been coinciding with a worsening of her cervical pain radiating down her right arm primarily.  She continues to struggle with severe pain and the depression and anxiety associated around all of these issues has been worse.  12/11/2020: The patient reports that she has had a very stressful time recently.  Her close friend has passed away and while her friend passed away peacefully and was in the care of hospice at the end of her life there is [employer] "shit show" happening around her friend's death.  The patient reports that the friends son who has been very active in the friend's life tragically died just a couple of days after the friend's death in a motor vehicle accident.  The son who had had little interaction with her friend did show up and there was conflict between the friend's new husband and the son regarding ownership of some of the property.  The friend had completed an extensive well that gave all of her estate to the grandchildren but there was still a great deal of arguing.  The husband had lifetime rights to the house for now.  The patient reports that the death of her friend's son happened before the funeral and so they ended up having a joint funeral for the son and the patient's friend.  The patient reports that her  depression has been very severe and the fact that she is the executor for her friend's estate has had a great deal of stress.  Her pain continues to be very severe.  05/02/2021:  Patient continues to struggle with pain and has not returned back to [facility] yet for any interventions.  They are still working on plan.  Patient still with stress over friends death and the fall out over friends grandchild etc.  Still a very complicated  situation.  08/05/2021: The patient reports that she has had a lot on her plate since I saw her last in April.  The patient reports that she now has her friend's great-granddaughter living permanently with her and is becoming part of her family.  This young lady does have a lot of behavioral issues and it is a challenge for the patient to manage some of the issues.  However, she feels a reward for being able to provide a stable home and follow the wishes of her friend after her friend passed away from cancer.  11/07/2021: The patient reports that she has continued to struggle with her chronic pain symptoms but reports that things have been stable as far as overall psychosocial issues and things are going well with her adopted daughter.  The patient reports that there have been ongoing issues with depression and anxiety.  Today we worked on coping and adjustment issues going forward and strategies to manage the symptoms.  Interventions Strategy:  Cognitive/behavioral therapeutic interventions along with coping skills and strategies around chronic pain, sleep disturbance and depression/anxiety.  Participation Level:   Active  Participation Quality:  Appropriate and Attentive      Behavioral Observation:  Well Groomed, Alert, and Appropriate.   Current Psychosocial Factors: Patient is still looking at having friend's grand daughter come live with patient and will be starting soon.   Content of Session:   Reviewed current symptoms and continue to work on therapeutic  interventions around issues related to her chronic pain and depression as well as issues related to her chronic complex regional pain syndrome and fibromyalgia symptoms.   Current Status:   The patient is hoping that they are able to do another spinal cord stimulator trial as she felt like it was helpful the first day before the device stopped working.  Patient Progress:   The patient reports that her overall sleep has improved from initial status but continues to be problematic.  The patient reports that her vein plays a big role in her difficulty sleeping.  Depression anxiety continue to be quite problematic for the patient.  Impression/Diagnosis:   Sally [name] is a 46 year old female referred by [facility] for therapeutic interventions due to chronic pain symptoms.  The patient reports that she has great difficulty standing, walking for long times, elbow pain, migraines, hip pain, shoulder and neck pain.  The patient fell at work and fractured both her elbows and also had knee replacement surgery.  The patient has been taking maintenance doses of opioid pain medications for some time.  The patient reports that she has a great deal of difficulty functioning and has very poor sleep and wakes up numerous times each night.  The patient also has dealt with depression and anxiety for many years and has been hospitalized for depression anxiety years ago.  She has been receiving counseling for anxiety and depression and takes Trintellix.   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   The patient describes chronic issues of depression anxiety but the development of severe chronic pain  After a  fall at work in 2015 where she fractured both of her elbows, injured her knee and developed  fibromyalgia/regional pain syndrome.  The patient reports that she has ongoing difficulties walking distances or standing.  She describes elbow pain, trouble lifting things and trouble writing for any period of time.  The patient describes significant sleep disturbance and is unable to fall asleep at night.  She reports that she is always tired.  The patient describes a normal appetite.  The patient describes memory issues related to trouble remembering things and concentration issues.  The patient reports that she is significantly less active with her children and husband and that her husband has to help more due to her significant pain.  We have continue to work on issues related to her pain and sleep status along with issues of depression and anxiety.  02/28/2020: The patient reports that she has had some worsening in her significant pain symptoms with cold and atmospheric changes.  She continues to have significant issues with her upper extremities related to complex regional pain syndrome and fibromyalgia.  The patient also reports that she has had significant psychosocial stressors with her closest friend having terminal cancer and being given 3 months to live.  The patient and her friend are talking about the patient taking over care of the patient's granddaughter and the patient also feels a great deal of need to do what ever she can for her friend going forward.  This is a very difficult and stressful situation for the patient on top of her numerous medical and pain issues.  The patient reports that her sleep continues to be improved but limited with pain disrupting sleep patterns.  The patient has been followed by Dr. Wess Botts MD [facility]) for some time for pain management and she is now been referred for an evaluation for spinal cord stimulator trialing and possible implantation as well as being  scheduled for ketamine infusions.  They are still working on Designer, television/film set. coverage for the ketamine infusions.  I think that the patient is an excellent candidate for both of these possible options.  The patient and I talked about the appropriateness for ketamine infusions due to her wide spectrum of issues for some time now and I am quite pleased to find out that her treating pain specialist's clinic is now doing ketamine infusions that she appears to be an excellent candidate.  She also appears to be an excellent candidate for spinal cord stimulator trialing and possible implantation.  04/24/2020: The patient returns reporting that she has gotten her spinal cord stimulator trialing device and has it implanted currently.  However, there have been mechanical problems with the device and it has been shutting off independently and the patient has not been able to get a good reading on how much it has helped her.  The patient reports that she did experience some brief improvement when she knew that it was on but it would turn off.  She is gone back to the device maker and they could not figure out the problem.  They are going to put a new external device in and extend the spinal cord stimulator trial in face for 2 days.  The patient has also had her first ketamine infusion trial.  She reports that she was told she was hallucinating and dissociative during this phase and experienced some improvement over the rest of the day as far as her mood and pain symptoms but it was over within 24 hours.  The patient is scheduled to have her next ketamine infusion coming up.  08/07/2020: The  patient reports that they are looking at doing a new spinal cord stimulator trial as the device did not work properly during her 7-day trial.  In fact, they attempted to extended to 10 days hoping to get it working correctly.  However, the patient reports that she felt like it did help the first day when it was apparently  working but shortly stopped working and it sounds like there were never able to get it working properly.  The patient reports that at this point, her anesthesiologist/pain specialist does not think that she should have any more ketamine trials but the patient would like to try them again.  The patient reports that the doctor felt that she had an adverse reaction to it but her husband was present and did not describe any severe reaction.  At this point, she is planning to talk to him again about possibly having another ketamine infusion if possible.  The patient reports that she continues with her significant chronic pain symptoms but she is managing day-to-day.  She reports that there continues to be a major stressor in her life relative to a very close friend who has terminal cancer and the cancer is continuing to progress.  09/11/2020: The patient reports that her friend is now being managed by hospice and appears to be at the end of her life.  This has been stressful but the patient has been able to adjust to the impending reality with time and she feels like she is handling it fairly well.  The patient reports that she continues to have significant pain in her arms radiating from her neck.  The patient reports that at this point she has not been able to return for ketamine therapies and not discussed strategies to follow-up with [facility].  11/06/2020: The patient has continued to have a lot of stress with depression and anxiety worsening particular around the impending death of a very close friend.  The friend is dying from metastatic cancer and the patient is one of the primary helpers for her friend.  The patient is very close to this friend.  The patient interacts with hospice and is spending a lot of time helping.  This is been coinciding with a worsening of her cervical pain radiating down her right arm primarily.  She continues to struggle with severe pain and the depression and anxiety  associated around all of these issues has been worse.  12/11/2020: The patient reports that she has had a very stressful time recently.  Her close friend has passed away and while her friend passed away peacefully and was in the care of hospice at the end of her life there is [employer] "shit show" happening around her friend's death.  The patient reports that the friends son who has been very active in the friend's life tragically died just a couple of days after the friend's death in a motor vehicle accident.  The son who had had little interaction with her friend did show up and there was conflict between the friend's new husband and the son regarding ownership of some of the property.  The friend had completed an extensive well that gave all of her estate to the grandchildren but there was still a great deal of arguing.  The husband had lifetime rights to the house for now.  The patient reports that the death of her friend's son happened before the funeral and so they ended up having a joint funeral for the son and the  patient's friend.  The patient reports that her depression has been very severe and the fact that she is the executor for her friend's estate has had a great deal of stress.  Her pain continues to be very severe.  Today we continue to work on therapeutic interventions for coping and adjustment in dealing with significant depression and recent exacerbations and stress.  05/02/2021:  Patient continues to struggle with pain and has not returned back to [facility] yet for any interventions.  They are still working on plan.  Patient still with stress over friends death and the fall out over friends grandchild etc.  Still a very complicated situation  [id number]: The patient reports that she has continued to struggle with her chronic pain symptoms but reports that things have been stable as far as overall psychosocial issues and things are going well with her adopted daughter.  The patient reports  that there have been ongoing issues with depression and anxiety.  Today we worked on coping and adjustment issues going forward and strategies to manage the symptoms.   Diagnosis: Complex regional pain syndrome, fibromyalgia, anxiety and depression, neck, shoulder and back pain.

## 2021-10-28 ENCOUNTER — Telehealth (INDEPENDENT_AMBULATORY_CARE_PROVIDER_SITE_OTHER): Payer: Medicare Other | Admitting: Psychology

## 2021-10-28 DIAGNOSIS — F331 Major depressive disorder, recurrent, moderate: Secondary | ICD-10-CM | POA: Diagnosis not present

## 2021-10-28 DIAGNOSIS — F5089 Other specified eating disorder: Secondary | ICD-10-CM | POA: Diagnosis not present

## 2021-10-28 DIAGNOSIS — F419 Anxiety disorder, unspecified: Secondary | ICD-10-CM | POA: Diagnosis not present

## 2021-11-07 ENCOUNTER — Ambulatory Visit (INDEPENDENT_AMBULATORY_CARE_PROVIDER_SITE_OTHER): Payer: Medicare Other | Admitting: Family Medicine

## 2021-11-07 ENCOUNTER — Encounter (INDEPENDENT_AMBULATORY_CARE_PROVIDER_SITE_OTHER): Payer: Self-pay | Admitting: Family Medicine

## 2021-11-07 VITALS — BP 118/79 | HR 75 | Temp 97.5°F | Ht 68.0 in | Wt 272.0 lb

## 2021-11-07 DIAGNOSIS — Z6841 Body Mass Index (BMI) 40.0 and over, adult: Secondary | ICD-10-CM

## 2021-11-07 DIAGNOSIS — E669 Obesity, unspecified: Secondary | ICD-10-CM | POA: Diagnosis not present

## 2021-11-07 DIAGNOSIS — R7303 Prediabetes: Secondary | ICD-10-CM | POA: Diagnosis not present

## 2021-11-07 DIAGNOSIS — F32A Depression, unspecified: Secondary | ICD-10-CM

## 2021-11-07 DIAGNOSIS — F3289 Other specified depressive episodes: Secondary | ICD-10-CM

## 2021-11-07 DIAGNOSIS — E559 Vitamin D deficiency, unspecified: Secondary | ICD-10-CM | POA: Insufficient documentation

## 2021-11-07 MED ORDER — METFORMIN HCL 500 MG PO TABS: 500.0000 mg | ORAL_TABLET | Freq: Every day | ORAL | 0 refills | Status: DC

## 2021-11-07 MED ORDER — VITAMIN D (ERGOCALCIFEROL) 1.25 MG (50000 UNIT) PO CAPS: ORAL_CAPSULE | ORAL | 0 refills | Status: DC

## 2021-11-07 MED ORDER — TOPIRAMATE 25 MG PO TABS: 25.0000 mg | ORAL_TABLET | Freq: Every day | ORAL | 0 refills | Status: DC

## 2021-11-07 NOTE — Progress Notes (Signed)
Office: [phone]  /  Fax: [phone]   Total lbs lost to date: 1 Total lbs lost since last in-office visit: +2     BP 118/79   Pulse 75   Temp (!) 97.5 F (36.4 C)   Ht '5\' 8"'$  (1.727 m)   Wt 272 lb (123.4 kg)   LMP 10/26/2015 (Exact Date)   SpO2 100%   BMI 41.36 kg/m  She was weighed on the bioimpedance scale:  Body mass index is 41.36 kg/m.  General:  Alert, oriented and cooperative. Patient is in no acute distress.  Mental Status: Normal mood and affect. Normal behavior. Normal judgment and thought content.        Patient past medical history includes:   Past Medical History:  Diagnosis Date   Acute appendicitis 09/09/2012   Anxiety    Back pain    Occ. issues wih back pain   Back pain 09/09/2012   It is intermittent now,  previously source of fibromyalgia dx.     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)    CRPS (complex regional pain syndrome), lower limb 2018   Depression    Edema of both lower extremities    Fibromyalgia    GERD (gastroesophageal reflux disease)    Headache    Hypothyroid 09/09/2012   Hypothyroidism    Joint pain    Osteoarthritis    Palpitation    only when having anxiety   Precancerous changes of the cervix    Sleep apnea    pre lap banding-never used cpap or mask   SOB (shortness of breath)    Swallowing difficulty    Vitamin D deficiency    History of Present Illness The patient presents with a history of vitamin D deficiency, prediabetes, and obesity. They report a recent weight gain of a couple of pounds, which they attribute to a combination of factors, including lying in bed for two weeks due to depression, a fall that resulted in chest pain, and a general lack of movement. The patient has a history of struggling with depression and is currently working with an outside counselor to address their grief.  The patient has been taking Effexor and Wellbutrin for their  depression, but they feel that the medications are not providing enough relief. They also report a lack of energy, which they believe may be partly due to their depression. The patient has been on a low dose of Topiramate, which they have not noticed any significant effects from. They express a desire to increase the dosage to potentially help with their mood stabilization and emotional eating behaviors.  The patient's eating habits have been affected by their emotional state, leading them to grab convenient foods, such as Pop-Tarts, rather than preparing healthier meals. They acknowledge that they do not feel like cooking and tend to choose foods that are not nutritionally beneficial. The patient is advised to focus on addressing their mood and emotional well-being before tackling their eating habits.  In addition to their mental health struggles, the patient has been experiencing some gastrointestinal discomfort from their metformin medication, which is prescribed for their prediabetes. They have reduced the dosage from two to one, as the higher dosage was causing significant stomach upset. The patient is due for a follow-up appointment and lab tests by the end of the year to monitor their vitamin D levels and prediabetes management.  Assessment & Plan Depression: Patient  reports increased depressive symptoms, including lack of motivation and energy, possibly related to grief over the loss of a close friend. -Continue current antidepressant regimen (Effexor and Wellbutrin). -Increase Topiramate from '25mg'$  to '50mg'$  daily, as it may help with mood stabilization. -Encourage continued counseling and grief therapy.  Prediabetes: Patient is on Metformin, but reports some gastrointestinal discomfort. -Continue Metformin at a tolerable dose of 1 tablet daily.  Weight gain: Patient reports weight gain, possibly related to decreased activity due to depression and recent injury. -Encourage patient to focus on  emotional health first, but to try to maintain regular meals with protein and engage in light activity, such as walking, when possible.  Vitamin D deficiency: Last level was close to goal, patient is due for recheck. -Refill Vitamin D supplement and plan to recheck levels before the end of the year.  Follow-up appointment: Scheduled for the near future. -Encourage patient to keep this appointment, especially given the upcoming holiday season.      [name], MD

## 2021-11-11 ENCOUNTER — Telehealth (INDEPENDENT_AMBULATORY_CARE_PROVIDER_SITE_OTHER): Payer: Medicare Other | Admitting: Psychology

## 2021-11-11 DIAGNOSIS — F331 Major depressive disorder, recurrent, moderate: Secondary | ICD-10-CM

## 2021-11-11 DIAGNOSIS — F419 Anxiety disorder, unspecified: Secondary | ICD-10-CM | POA: Diagnosis not present

## 2021-11-11 DIAGNOSIS — F5089 Other specified eating disorder: Secondary | ICD-10-CM

## 2021-11-11 NOTE — Progress Notes (Signed)
Office: [phone]  /  Fax: [phone]    Date: November 11, 2021    Appointment Start Time: 11:01am Duration: 25 minutes Provider: Glennie Isle, Psy.D. Type of Session: Individual Therapy  Location of Patient: Home (private location) Location of Provider: Provider's Home (private office) Type of Contact: Telepsychological Visit via MyChart Video Visit  Session Content: Sally [name] is a 46 y.o. female presenting for a follow-up appointment to address the previously established treatment goal of increasing coping skills.Today's appointment was a telepsychological visit. Chalet provided verbal consent for today's telepsychological appointment and she is aware she is responsible for securing confidentiality on her end of the session. Prior to proceeding with today's appointment, [name]'s physical location at the time of this appointment was obtained as well a phone number she could be reached at in the event of technical difficulties. [name] and this provider participated in today's telepsychological service.   This provider conducted a brief check-in. [name] shared about recent events, including the death anniversary of a friend. She noted, "I'm okay." Briefly explored and processed. A risk assessment was completed. [name] denied experiencing suicidal, self-injurious and homicidal ideation, plan, and intent since the last appointment with this provider. She continues to acknowledge understanding regarding the importance of reaching out to trusted individuals and/or emergency resources if she is unable to ensure safety. She also described future plans that include ongoing efforts to improve her well-being and eating habits.   Reviewed emotional and physical hunger. [name] acknowledged she did not have the opportunity to review the shared handout. Psychoeducation regarding triggers for emotional eating was provided. [name] was provided a handout, and encouraged to utilize the handout between now and the next  appointment to increase awareness of triggers and frequency. [name] agreed. This provider also discussed behavioral strategies for specific triggers, such as placing the utensil down when conversing to avoid mindless eating. [name] provided verbal consent during today's appointment for this provider to send a handout about triggers via e-mail. Overall, [name] was receptive to today's appointment as evidenced by openness to sharing, responsiveness to feedback, and willingness to explore triggers for emotional eating.  Mental Status Examination:  Appearance: neat Behavior: appropriate to circumstances Mood: depressed Affect: mood congruent and tearful Speech: WNL Eye Contact: appropriate Psychomotor Activity: WNL Gait: unable to assess Thought Process: linear, logical, and goal directed and denies suicidal, homicidal, and self-harm ideation, plan and intent  Thought Content/Perception: no hallucinations, delusions, bizarre thinking or behavior endorsed or observed Orientation: AAOx4 Memory/Concentration: memory, attention, language, and fund of knowledge intact  Insight: fair Judgment: fair  Interventions:  Conducted a brief chart review Conducted a risk assessment Provided empathic reflections and validation Reviewed content from the previous session Employed supportive psychotherapy interventions to facilitate reduced distress and to improve coping skills with identified stressors Psychoeducation provided regarding triggers for emotional eating behaviors  DSM-5 Diagnosis(es):  F50.89 Other Specified Feeding or Eating Disorder, Emotional Eating Behaviors, F33.1  Major Depressive Disorder, Recurrent Episode, Moderate, and F41.9 Unspecified Anxiety Disorder  Treatment Goal & Progress: Progress is limited, as [name] has just begun treatment with this provider; however, she is receptive to the interaction and interventions and rapport is being established.   Plan: The next appointment is  scheduled for 11/25/2021 at 2:30pm, which will be via MyChart Video Visit. The next session will focus on working towards the established treatment goal. Additionally, [name] will continue with her primary therapist and pain management therapist. Her next appointment with her primary therapist is on November 13, 2021.

## 2021-11-21 ENCOUNTER — Ambulatory Visit: Payer: Medicare Other | Admitting: Psychology

## 2021-11-25 ENCOUNTER — Telehealth (INDEPENDENT_AMBULATORY_CARE_PROVIDER_SITE_OTHER): Payer: Medicare Other | Admitting: Psychology

## 2021-11-25 DIAGNOSIS — F5089 Other specified eating disorder: Secondary | ICD-10-CM

## 2021-11-25 DIAGNOSIS — F419 Anxiety disorder, unspecified: Secondary | ICD-10-CM | POA: Diagnosis not present

## 2021-11-25 DIAGNOSIS — F331 Major depressive disorder, recurrent, moderate: Secondary | ICD-10-CM | POA: Diagnosis not present

## 2021-11-25 NOTE — Progress Notes (Signed)
  Office: [phone]  /  Fax: [phone]    Date: November 25, 2021    Appointment Start Time: 2:35pm Duration: 19 minutes Provider: Glennie Isle, Psy.D. Type of Session: Individual Therapy  Location of Patient: Home (private location) Location of Provider: Provider's Home (private office) Type of Contact: Telepsychological Visit via MyChart Video Visit  Session Content: Sally [name] is a 46 y.o. female presenting for a follow-up appointment to address the previously established treatment goal of increasing coping skills.Today's appointment was a telepsychological visit. Sally [name] provided verbal consent for today's telepsychological appointment and she is aware she is responsible for securing confidentiality on her end of the session. Prior to proceeding with today's appointment, Sally [name]'s physical location at the time of this appointment was obtained as well a phone number she could be reached at in the event of technical difficulties. Sally [name] and this provider participated in today's telepsychological service.   This provider conducted a brief check-in. Sally [name] stated, "I'm doing okay," adding her primary therapist has been helpful. A risk assessment was completed. Sally [name] denied experiencing suicidal, self-injurious and homicidal ideation, plan, and intent since the last appointment with this provider. She continues to acknowledge understanding regarding the importance of reaching out to trusted individuals and/or emergency resources if she is unable to ensure safety. She also described future plans that include ongoing efforts to improve her well-being and eating habits. Reviewed triggers for emotional eating behaviors. Sally [name] was engaged in problem solving to develop a plan to help cope with urges/cravings involving activities to relax, activities to distract, comforting places, people to call and connect with, and activities that help soothe senses. She was observed writing the plan. Overall, Sally [name] was  receptive to today's appointment as evidenced by openness to sharing, responsiveness to feedback, and willingness to implement discussed strategies .  Mental Status Examination:  Appearance: neat Behavior: appropriate to circumstances Mood: neutral Affect: mood congruent Speech: WNL Eye Contact: appropriate Psychomotor Activity: WNL Gait: unable to assess Thought Process: linear, logical, and goal directed and denies suicidal, homicidal, and self-harm ideation, plan and intent  Thought Content/Perception: no hallucinations, delusions, bizarre thinking or behavior endorsed or observed Orientation: AAOx4 Memory/Concentration: memory, attention, language, and fund of knowledge intact  Insight: fair Judgment: fair  Interventions:  Conducted a brief chart review Conducted a risk assessment Provided empathic reflections and validation Employed supportive psychotherapy interventions to facilitate reduced distress and to improve coping skills with identified stressors Engaged patient in problem solving  DSM-5 Diagnosis(es):  F50.89 Other Specified Feeding or Eating Disorder, Emotional Eating Behaviors, F33.1  Major Depressive Disorder, Recurrent Episode, Moderate, and F41.9 Unspecified Anxiety Disorder  Treatment Goal & Progress: During the initial appointment with this provider, the following treatment goal was established: increase coping skills. Sally [name] has demonstrated progress in her goal as evidenced by increased awareness of hunger patterns and increased awareness of triggers for emotional eating behaviors.   Plan: The next appointment is scheduled for 12/16/2021 at 2pm, which will be via Benedict Visit. The next session will focus on working towards the established treatment goal.  Additionally, Sally [name] will continue with her primary therapist and pain management therapist.

## 2021-11-28 ENCOUNTER — Ambulatory Visit (INDEPENDENT_AMBULATORY_CARE_PROVIDER_SITE_OTHER): Payer: Medicare Other | Admitting: Family Medicine

## 2021-11-28 ENCOUNTER — Encounter (INDEPENDENT_AMBULATORY_CARE_PROVIDER_SITE_OTHER): Payer: Self-pay | Admitting: Family Medicine

## 2021-11-28 VITALS — BP 120/81 | HR 100 | Temp 98.2°F | Ht 68.0 in | Wt 270.0 lb

## 2021-11-28 DIAGNOSIS — F3289 Other specified depressive episodes: Secondary | ICD-10-CM

## 2021-11-28 DIAGNOSIS — E669 Obesity, unspecified: Secondary | ICD-10-CM

## 2021-11-28 DIAGNOSIS — Z6841 Body Mass Index (BMI) 40.0 and over, adult: Secondary | ICD-10-CM | POA: Diagnosis not present

## 2021-11-28 DIAGNOSIS — R7303 Prediabetes: Secondary | ICD-10-CM

## 2021-11-28 DIAGNOSIS — E66813 Obesity, class 3: Secondary | ICD-10-CM

## 2021-11-28 MED ORDER — TOPIRAMATE 50 MG PO TABS: 50.0000 mg | ORAL_TABLET | Freq: Every day | ORAL | 0 refills | Status: DC

## 2021-11-28 MED ORDER — METFORMIN HCL 500 MG PO TABS: 500.0000 mg | ORAL_TABLET | Freq: Every day | ORAL | 0 refills | Status: DC

## 2021-12-11 NOTE — Progress Notes (Signed)
Chief Complaint:   OBESITY Sally [name] is here to discuss her progress with her obesity treatment plan along with follow-up of her obesity related diagnoses. Sally [name] is on keeping a food journal and adhering to recommended goals of 1400-1500 calories and 85+ grams of protein daily and states she is following her eating plan approximately 60% of the time. Sally [name] states she is doing 0 minutes 0 times per week.  Today's visit was #: 61 Starting weight: 273 lbs Starting date: 08/07/2020 Today's weight: 270 lbs Today's date: 11/28/2021 Total lbs lost to date: 3 Total lbs lost since last in-office visit: 2  Interim History: Sally [name] has been working on getting back on track. She is still struggling with cravings for sweets.   Subjective:   1. Prediabetes Sally [name] is taking metformin with no side effects. She is taking it once daily. She had poor GI tolerance with BID dosing.   2. Other depression with emotional eating Sally [name] is working with Dr. Mallie Mussel on strategies for cravings. She is taking Wellbutrin and Effexor. She started Topamax 25 mg qhs, but she reports little effect on cravings thus far.   Assessment/Plan:   1. Prediabetes Sally [name] will continue metformin, diet, and exercise. We will refill metformin for 1 month.   - metFORMIN (GLUCOPHAGE) 500 MG tablet; Take 1 tablet (500 mg total) by mouth daily with breakfast.  Dispense: 30 tablet; Refill: 0  2. Other depression with emotional eating Sally [name] agreed to increase Topamax to 50 mg qhs, and we will refill for 1 month. She will continue counseling with Dr. Mallie Mussel, and continue with her diet and exercise.   - topiramate (TOPAMAX) 50 MG tablet; Take 1 tablet (50 mg total) by mouth daily.  Dispense: 30 tablet; Refill: 0  3. Obesity, Current BMI 41.1 Sally [name] is currently in the action stage of change. As such, her goal is to continue with weight loss efforts. She has agreed to keeping a food journal and adhering to recommended goals of 1400 calories  and 90 grams of protein daily or following a lower carbohydrate, vegetable and lean protein rich diet plan.   Exercise goals: All adults should avoid inactivity. Some physical activity is better than none, and adults who participate in any amount of physical activity gain some health benefits.  Behavioral modification strategies: increasing lean protein intake and holiday eating strategies .  Sally [name] has agreed to follow-up with our clinic in 3 weeks. She was informed of the importance of frequent follow-up visits to maximize her success with intensive lifestyle modifications for her multiple health conditions.   Objective:   Blood pressure 120/81, pulse 100, temperature 98.2 F (36.8 C), height '5\' 8"'$  (1.727 m), weight 270 lb (122.5 kg), last menstrual period 10/26/2015, SpO2 98 %. Body mass index is 41.05 kg/m.  General: Cooperative, alert, well developed, in no acute distress. HEENT: Conjunctivae and lids unremarkable. Cardiovascular: Regular rhythm.  Lungs: Normal work of breathing. Neurologic: No focal deficits.   Lab Results  Component Value Date   CREATININE 0.73 09/08/2021   BUN 13 09/08/2021   NA 141 09/08/2021   K 4.1 09/08/2021   CL 107 09/08/2021   CO2 27 09/08/2021   Lab Results  Component Value Date   ALT 17 05/06/2021   AST 17 05/06/2021   ALKPHOS 92 05/06/2021   BILITOT 0.2 05/06/2021   Lab Results  Component Value Date   HGBA1C 5.5 05/06/2021   HGBA1C 5.3 08/07/2020   HGBA1C 5.3 04/03/2015   Lab Results  Component Value Date   INSULIN 4.2 05/06/2021   INSULIN 4.9 08/07/2020   Lab Results  Component Value Date   TSH 0.010 (L) 05/06/2021   Lab Results  Component Value Date   CHOL 151 05/06/2021   HDL 44 05/06/2021   LDLCALC 92 05/06/2021   TRIG 75 05/06/2021   CHOLHDL 5 09/03/2016   Lab Results  Component Value Date   VD25OH 45.4 05/06/2021   VD25OH 34 01/08/2016   VD25OH 21 (L) 10/19/2015   Lab Results  Component Value Date   WBC 8.1  09/08/2021   HGB 14.2 09/08/2021   HCT 43.6 09/08/2021   MCV 95.4 09/08/2021   PLT 256 09/08/2021   Lab Results  Component Value Date   FERRITIN 67 07/27/2014   Attestation Statements:   Reviewed by clinician on day of visit: allergies, medications, problem list, medical history, surgical history, family history, social history, and previous encounter notes.   I, Trixie Dredge, am acting as transcriptionist for [name], MD.  I have reviewed the above documentation for accuracy and completeness, and I agree with the above. -  [name], MD

## 2021-12-16 ENCOUNTER — Telehealth (INDEPENDENT_AMBULATORY_CARE_PROVIDER_SITE_OTHER): Payer: Medicare Other | Admitting: Psychology

## 2021-12-16 DIAGNOSIS — F5089 Other specified eating disorder: Secondary | ICD-10-CM

## 2021-12-16 DIAGNOSIS — F331 Major depressive disorder, recurrent, moderate: Secondary | ICD-10-CM

## 2021-12-16 DIAGNOSIS — F419 Anxiety disorder, unspecified: Secondary | ICD-10-CM

## 2021-12-16 NOTE — Progress Notes (Signed)
  Office: [phone]  /  Fax: [phone]    Date: December 16, 2021    Appointment Start Time: 2:01pm Duration: 22 minutes Provider: Glennie Isle, Psy.D. Type of Session: Individual Therapy  Location of Patient: Home (private location) Location of Provider: Provider's Home (private office) Type of Contact: Telepsychological Visit via MyChart Video Visit  Session Content: Sally [name] is a 46 y.o. female presenting for a follow-up appointment to address the previously established treatment goal of increasing coping skills.Today's appointment was a telepsychological visit. Sally [name] provided verbal consent for today's telepsychological appointment and she is aware she is responsible for securing confidentiality on her end of the session. Prior to proceeding with today's appointment, Sally [name]'s physical location at the time of this appointment was obtained as well a phone number she could be reached at in the event of technical difficulties. Sally [name] and this provider participated in today's telepsychological service.   This provider conducted a brief check-in. Sally [name] shared about recent events. A risk assessment was completed. Sally [name] denied experiencing suicidal, self-injurious and homicidal ideation, plan, and intent since the last appointment with this provider. She also described future plans that include ongoing efforts to improve her well-being and eating habits. This provider and Sally [name] reflected on what went well for Thanksgiving and what she could do differently for the upcoming holiday celebrations. She continues to report challenges with eating congruent to her structured meal plan on a daily basis. Further explored and processed. She was engaged in problem solving to help her eat more regularly and increase protein intake, as she discussed going long periods without eating. She agreed to set reminders to eat on her phone and choose protein options for each snack/meal. Overall, Sally [name] was receptive to today's  appointment as evidenced by openness to sharing, responsiveness to feedback, and willingness to implement discussed strategies .  Mental Status Examination:  Appearance: neat Behavior: appropriate to circumstances Mood: neutral Affect: mood congruent Speech: WNL Eye Contact: appropriate Psychomotor Activity: WNL Gait: unable to assess Thought Process: linear, logical, and goal directed and denies suicidal, homicidal, and self-harm ideation, plan and intent  Thought Content/Perception: no hallucinations, delusions, bizarre thinking or behavior endorsed or observed Orientation: AAOx4 Memory/Concentration: memory, attention, language, and fund of knowledge intact  Insight: fair Judgment: fair  Interventions:  Conducted a brief chart review Conducted a risk assessment Provided empathic reflections and validation Provided positive reinforcement Employed supportive psychotherapy interventions to facilitate reduced distress and to improve coping skills with identified stressors Engaged patient in problem solving  DSM-5 Diagnosis(es):  F50.89 Other Specified Feeding or Eating Disorder, Emotional Eating Behaviors, F33.1  Major Depressive Disorder, Recurrent Episode, Moderate, and F41.9 Unspecified Anxiety Disorder  Treatment Goal & Progress: During the initial appointment with this provider, the following treatment goal was established: increase coping skills. Sally [name] has demonstrated progress in her goal as evidenced by increased awareness of hunger patterns and increased awareness of triggers for emotional eating behaviors. Sally [name] also continues to demonstrate willingness to engage in learned skill(s).  Plan: The next appointment is scheduled for 12/30/2021 at 10am, which will be via MyChart Video Visit. The next session will focus on working towards the established treatment goal. Additionally, Sally [name] will continue with her primary therapist and pain management therapist.

## 2021-12-19 ENCOUNTER — Encounter (INDEPENDENT_AMBULATORY_CARE_PROVIDER_SITE_OTHER): Payer: Self-pay | Admitting: Family Medicine

## 2021-12-19 ENCOUNTER — Ambulatory Visit (INDEPENDENT_AMBULATORY_CARE_PROVIDER_SITE_OTHER): Payer: Medicare Other | Admitting: Family Medicine

## 2021-12-19 VITALS — BP 128/74 | HR 77 | Temp 97.9°F | Ht 68.0 in | Wt 270.0 lb

## 2021-12-19 DIAGNOSIS — F3289 Other specified depressive episodes: Secondary | ICD-10-CM | POA: Diagnosis not present

## 2021-12-19 DIAGNOSIS — Z6841 Body Mass Index (BMI) 40.0 and over, adult: Secondary | ICD-10-CM | POA: Diagnosis not present

## 2021-12-19 DIAGNOSIS — E669 Obesity, unspecified: Secondary | ICD-10-CM

## 2021-12-19 MED ORDER — TOPIRAMATE 50 MG PO TABS: 50.0000 mg | ORAL_TABLET | Freq: Every day | ORAL | 0 refills | Status: DC

## 2021-12-26 ENCOUNTER — Encounter: Payer: Medicare Other | Attending: Psychology | Admitting: Psychology

## 2021-12-26 DIAGNOSIS — R4189 Other symptoms and signs involving cognitive functions and awareness: Secondary | ICD-10-CM | POA: Diagnosis present

## 2021-12-26 DIAGNOSIS — G90513 Complex regional pain syndrome I of upper limb, bilateral: Secondary | ICD-10-CM | POA: Insufficient documentation

## 2021-12-26 DIAGNOSIS — G894 Chronic pain syndrome: Secondary | ICD-10-CM | POA: Diagnosis present

## 2021-12-26 DIAGNOSIS — F419 Anxiety disorder, unspecified: Secondary | ICD-10-CM | POA: Diagnosis present

## 2021-12-26 DIAGNOSIS — M797 Fibromyalgia: Secondary | ICD-10-CM | POA: Diagnosis present

## 2021-12-26 DIAGNOSIS — F32A Depression, unspecified: Secondary | ICD-10-CM | POA: Insufficient documentation

## 2021-12-30 ENCOUNTER — Telehealth (INDEPENDENT_AMBULATORY_CARE_PROVIDER_SITE_OTHER): Payer: Medicare Other | Admitting: Psychology

## 2021-12-30 NOTE — Progress Notes (Signed)
Chief Complaint:   OBESITY Sally [name] is here to discuss her progress with her obesity treatment plan along with follow-up of her obesity related diagnoses. Sally [name] is on keeping a food journal and adhering to recommended goals of 1400 calories and 85 grams of protein and states she is following her eating plan approximately 60% of the time. Sally [name] states she is doing 0 minutes 0 times per week.  Today's visit was #: 20 Starting weight: 273 lbs Starting date: 08/07/2020 Today's weight: 270 lbs Today's date: 12/19/2021 Total lbs lost to date: 3 Total lbs lost since last in-office visit: 0  Interim History: Sally [name] did well with maintaining her weight even over Thanksgiving. She struggles with finding healthier and easy recipes that she and her family will eat. She also struggles to meet her protein goals.   Subjective:   1. Other depression with emotional eating Sally [name] is doing well on Topamax and she will remember to take regularly. No side effects were noted.   Assessment/Plan:   1. Other depression with emotional eating Sally [name] will continue Topamax 50 mg once daily, and we will refill for 1 month.   - topiramate (TOPAMAX) 50 MG tablet; Take 1 tablet (50 mg total) by mouth daily.  Dispense: 30 tablet; Refill: 0  2. Obesity, Current BMI 41.1 Sally [name] is currently in the action stage of change. As such, her goal is to continue with weight loss efforts. She has agreed to keeping a food journal and adhering to recommended goals of 1400 calories and 85 grams of protein daily.   High protein easy recipes were given.   Behavioral modification strategies: increasing lean protein intake and meal planning and cooking strategies.  Sally [name] has agreed to follow-up with our clinic in 3 weeks. She was informed of the importance of frequent follow-up visits to maximize her success with intensive lifestyle modifications for her multiple health conditions.   Objective:   Blood pressure 128/74, pulse 77,  temperature 97.9 F (36.6 C), height '5\' 8"'$  (1.727 m), weight 270 lb (122.5 kg), last menstrual period 10/26/2015, SpO2 90 %. Body mass index is 41.05 kg/m.  General: Cooperative, alert, well developed, in no acute distress. HEENT: Conjunctivae and lids unremarkable. Cardiovascular: Regular rhythm.  Lungs: Normal work of breathing. Neurologic: No focal deficits.   Lab Results  Component Value Date   CREATININE 0.73 09/08/2021   BUN 13 09/08/2021   NA 141 09/08/2021   K 4.1 09/08/2021   CL 107 09/08/2021   CO2 27 09/08/2021   Lab Results  Component Value Date   ALT 17 05/06/2021   AST 17 05/06/2021   ALKPHOS 92 05/06/2021   BILITOT 0.2 05/06/2021   Lab Results  Component Value Date   HGBA1C 5.5 05/06/2021   HGBA1C 5.3 08/07/2020   HGBA1C 5.3 04/03/2015   Lab Results  Component Value Date   INSULIN 4.2 05/06/2021   INSULIN 4.9 08/07/2020   Lab Results  Component Value Date   TSH 0.010 (L) 05/06/2021   Lab Results  Component Value Date   CHOL 151 05/06/2021   HDL 44 05/06/2021   LDLCALC 92 05/06/2021   TRIG 75 05/06/2021   CHOLHDL 5 09/03/2016   Lab Results  Component Value Date   VD25OH 45.4 05/06/2021   VD25OH 34 01/08/2016   VD25OH 21 (L) 10/19/2015   Lab Results  Component Value Date   WBC 8.1 09/08/2021   HGB 14.2 09/08/2021   HCT 43.6 09/08/2021   MCV 95.4 09/08/2021  PLT 256 09/08/2021   Lab Results  Component Value Date   FERRITIN 67 07/27/2014   Attestation Statements:   Reviewed by clinician on day of visit: allergies, medications, problem list, medical history, surgical history, family history, social history, and previous encounter notes.  Time spent on visit including pre-visit chart review and post-visit care and charting was 30 minutes.   I, Trixie Dredge, am acting as transcriptionist for [name], MD.  I have reviewed the above documentation for accuracy and completeness, and I agree with the above. -  [name],  MD

## 2022-01-05 ENCOUNTER — Encounter: Payer: Self-pay | Admitting: Psychology

## 2022-01-05 NOTE — Progress Notes (Addendum)
Patient:  Sally [name]   DOB: 11-05-1975  MR Number: [id number]  Location: [facility] [place], [address] [id number] [place] [id number] Dept: [phone]  Start: 9 AM End: 10 AM  .  Today's visit was an in person visit conducted in outpatient clinic office with the patient myself present.   Provider/Observer:     Edgardo Roys PsyD  Chief Complaint:      Chief Complaint  Patient presents with   Anxiety   Depression   Pain   Other    Reason For Service:     Sally [name] is a 46 year old female referred by [facility] for therapeutic interventions due to chronic pain symptoms.  The patient reports that she has great difficulty standing, walking for long times, elbow pain, migraines, hip pain, shoulder and neck pain.  The patient fell at work and fractured both her elbows and also had knee replacement surgery.  The patient has been taking maintenance doses of opioid pain medications for some time.  The patient reports that she has a great deal of difficulty functioning and has very poor sleep and wakes up numerous times each night.  The patient also has dealt with depression and anxiety for many years and has been hospitalized for depression anxiety years ago.  She has been receiving counseling for anxiety and depression and takes Trintellix.   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   The patient describes chronic issues of depression anxiety but the development of severe chronic pain.  The patient reports that she is continuing to have some pain  in her back post surgery and had a recent fall.  Follow-up MRI did not suggest any structural damage or injury from this fall.  The patient reports that her symptoms associated with complex regional pain syndrome have worsened recently.  11/01/2019: The patient is continued to have significant pain and distress with worsening episodes of her complex regional pain syndrome and fibromyalgia type symptoms.  The patient has been very frustrated at times with her lack of ability to function and do daily activities.  01/03/2020: The patient reports that she is continued to have significant pain but has particularly had worsening of her fatigue and somnolence.  She reports that she feels like she is sleeping an adequate amount of time but is very tired and fatigued throughout the day.  02/28/2020: The patient reports that she has had some worsening in her significant pain symptoms with cold and atmospheric changes.  She continues to have significant issues with her upper extremities related to complex regional pain syndrome and fibromyalgia.  The patient also reports that she has had significant psychosocial stressors with her closest friend having terminal cancer and being given 3 months to live.  The patient and her friend are talking about the patient taking over care of the patient's granddaughter and the patient also feels a great deal of need to do what ever she can for her friend going forward.  This is a very difficult and stressful situation for the patient on top of her numerous medical and pain issues.  The patient reports that her sleep continues to be improved but limited with pain disrupting sleep patterns.  The patient has been followed by Dr. Wess Botts MD [facility]) for some time for pain management and she is now been referred for an evaluation for spinal cord stimulator trialing and possible implantation as well as being scheduled for ketamine infusions.  They are still working on Geophysical data processor. coverage for the ketamine infusions.  I think that the patient is an excellent candidate for both of these possible options.  The patient and I talked about the appropriateness for ketamine infusions due to her wide spectrum of issues for some time now and I am quite pleased to find out that her treating pain specialist's clinic is now doing ketamine infusions that she appears to be an excellent candidate.  She also appears to be an excellent candidate for spinal cord stimulator trialing and possible implantation.  03/27/2020: The patient reports that she has gone through all of the preliminary work-up to have a spinal cord stimulator trial conducted and is looking forward with hope that it will help with her significant cervical related pain.  The patient is also has an initial appointment to assess for possible ketamine infusion therapies as well.  We reviewed issues related to both the spinal cord stimulator and ketamine infusion with the patient as well as worked on therapeutic interventions around her chronic pain/complex regional pain syndrome diagnoses.  The patient reports that she still has significant psychosocial stressors with her closest friend dying from breast cancer and in the last month or 2 of her life.  At this point, the patient does not look like she will have to take over her friend's granddaughters care as the family is hoping that an uncle of the young girl will take over care.  04/24/2020: The patient returns reporting that she has gotten her spinal cord stimulator trialing device and has it implanted currently.  However, there have been mechanical problems with the device and it has been shutting off independently and the patient has not been able to get a good reading on how much it has helped her.  The patient reports that she did experience some brief improvement when she knew that it was on but it would turn off.  She is gone back to the device maker and they could  not figure out the problem.  They are going to put a new external device in and extend the spinal cord stimulator trial in face for 2 days.  The patient has also had her first ketamine infusion trial.  She reports that she was told she was hallucinating and dissociative during this phase and experienced some improvement over the rest of the day as far as her mood and pain symptoms but it was over within 24 hours.  The patient is scheduled to have her next ketamine infusion coming up.  08/07/2020: The patient reports that they are looking at doing a new spinal cord stimulator trial as the device did not work properly during her 7-day trial.  In fact, they attempted to extended to 10 days hoping to get it working correctly.  However, the patient reports that she felt like it did help the first day when it was apparently working but shortly stopped working and it sounds like there were never able to get it working properly.  The patient reports that at this point, her anesthesiologist/pain specialist does not think that she should have any more  ketamine trials but the patient would like to try them again.  The patient reports that the doctor felt that she had an adverse reaction to it but her husband was present and did not describe any severe reaction.  At this point, she is planning to talk to him again about possibly having another ketamine infusion if possible.  The patient reports that she continues with her significant chronic pain symptoms but she is managing day-to-day.  She reports that there continues to be a major stressor in her life relative to a very close friend who has terminal cancer and the cancer is continuing to progress.  09/11/2020: The patient reports that her friend is now being managed by hospice and appears to be at the end of her life.  This has been stressful but the patient has been able to adjust to the impending reality with time and she feels like she is handling it fairly well.  The  patient reports that she continues to have significant pain in her arms radiating from her neck.  The patient reports that at this point she has not been able to return for ketamine therapies and not discussed strategies to follow-up with [facility].  11/06/2020: The patient has continued to have a lot of stress with depression and anxiety worsening particular around the impending death of a very close friend.  The friend is dying from metastatic cancer and the patient is one of the primary helpers for her friend.  The patient is very close to this friend.  The patient interacts with hospice and is spending a lot of time helping.  This is been coinciding with a worsening of her cervical pain radiating down her right arm primarily.  She continues to struggle with severe pain and the depression and anxiety associated around all of these issues has been worse.  12/11/2020: The patient reports that she has had a very stressful time recently.  Her close friend has passed away and while her friend passed away peacefully and was in the care of hospice at the end of her life there is [employer] "shit show" happening around her friend's death.  The patient reports that the friends son who has been very active in the friend's life tragically died just a couple of days after the friend's death in a motor vehicle accident.  The son who had had little interaction with her friend did show up and there was conflict between the friend's new husband and the son regarding ownership of some of the property.  The friend had completed an extensive well that gave all of her estate to the grandchildren but there was still a great deal of arguing.  The husband had lifetime rights to the house for now.  The patient reports that the death of her friend's son happened before the funeral and so they ended up having a joint funeral for the son and the patient's friend.  The patient reports that her depression has been very severe  and the fact that she is the executor for her friend's estate has had a great deal of stress.  Her pain continues to be very severe.  05/02/2021:  Patient continues to struggle with pain and has not returned back to [facility] yet for any interventions.  They are still working on plan.  Patient still with stress over friends death and the fall out over friends grandchild etc.  Still a very complicated situation.  08/05/2021: The patient reports that she has  had a lot on her plate since I saw her last in April.  The patient reports that she now has her friend's great-granddaughter living permanently with her and is becoming part of her family.  This young lady does have a lot of behavioral issues and it is a challenge for the patient to manage some of the issues.  However, she feels a reward for being able to provide a stable home and follow the wishes of her friend after her friend passed away from cancer.  11-10-2021: The patient reports that she has continued to struggle with her chronic pain symptoms but reports that things have been stable as far as overall psychosocial issues and things are going well with her adopted daughter.  The patient reports that there have been ongoing issues with depression and anxiety.  Today we worked on coping and adjustment issues going forward and strategies to manage the symptoms.  12/26/2021: The patient returns today after 79-monthbreak.  The patient continues to struggle with chronic pain symptoms.  The patient has [facility]  The patient reports that this has helped some but she continues with her significant pain.  The patient has been [place] therapies [place] they felt she was not a good candidate although the patient wanted to continue with these efforts.  I have referred the patient to Dr. RRanell Patrickhere in our office for review about the  appropriateness of sublingual ketamine treatment as the patient has verbalized willingness to continue with therapy and this strategy but a different type of very small doses delivered sublingually.  The patient is hoping that this will also help with her depression and other symptoms that go along with her pain.  I will sit down with Dr. RRanell Patrickand reviewed this prior to the patient's visit that is scheduled on 01/14/2022.  Interventions Strategy:  Cognitive/behavioral therapeutic interventions along with coping skills and strategies around chronic pain, sleep disturbance and depression/anxiety.  Participation Level:   Active  Participation Quality:  Appropriate and Attentive      Behavioral Observation:  Well Groomed, Alert, and Appropriate.   Current Psychosocial Factors: Patient is still looking at having friend's grand daughter come live with patient and will be starting soon.   Content of Session:   Reviewed current symptoms and changes she is experienced since her cervical surgery and pain she experiences radiating down her arms.  We also reviewed strategies for restarting ketamine therapies but with a different strategy including sublingual ketamine.   Current Status:   The patient is hoping that they are able to do another spinal cord stimulator trial as she felt like it was helpful the first day before the device stopped working.  Patient Progress:   The patient reports that her overall sleep has improved from initial status but continues to be problematic.  The patient reports that her vein plays a big role in her difficulty sleeping.  Depression anxiety continue to be quite problematic for the patient.  Impression/Diagnosis:   DTamu Golzis a 46year old female [facility] [place] [facility] [place] symptoms.  The patient reports that she has great difficulty standing, walking for long times, elbow pain, migraines, hip pain, shoulder and  neck pain.  The patient fell at work and fractured both her elbows and also had knee replacement surgery.  The patient has been taking maintenance doses  of opioid pain medications for some time.  The patient reports that she has a great deal of difficulty functioning and has very poor sleep and wakes up numerous times each night.  The patient also has dealt with depression and anxiety for many years and has been hospitalized for depression anxiety years ago.  She has been receiving counseling for anxiety and depression and takes Trintellix.   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   The patient describes chronic issues of depression anxiety but the development of severe chronic pain  After a fall at work in 2015 where she fractured both of her elbows, injured her knee and developed fibromyalgia/regional pain syndrome.  The patient reports that she has ongoing difficulties walking distances or standing.  She describes elbow pain, trouble lifting things and trouble writing for any period of time.  The patient describes significant sleep disturbance and is unable to fall asleep at night.  She reports that she is always tired.  The patient describes a normal appetite.  The patient describes memory issues related to trouble remembering things and concentration issues.  The patient reports that she is significantly less active with her children and husband and that her husband has to help more due to her significant pain.  We have continue to work on issues related to her pain and sleep status along with issues of depression and anxiety.  02/28/2020: The patient reports that she has had some worsening in  her significant pain symptoms with cold and atmospheric changes.  She continues to have significant issues with her upper extremities related to complex regional pain syndrome and fibromyalgia.  The patient also reports that she has had significant psychosocial stressors with her closest friend having terminal cancer and being given 3 months to live.  The patient and her friend are talking about the patient taking over care of the patient's granddaughter and the patient also feels a great deal of need to do what ever she can for her friend going forward.  This is a very difficult and stressful situation for the patient on top of her numerous medical and pain issues.  The patient reports that her sleep continues to be improved but limited with pain disrupting sleep patterns.  The patient has been followed by Dr. Wess Botts MD [facility]) for some time for pain management and she is now been referred for an evaluation for spinal cord stimulator trialing and possible implantation as well as being scheduled for ketamine infusions.  They are still working on Designer, television/film set. coverage for the ketamine infusions.  I think that the patient is an excellent candidate for both of these possible options.  The patient and I talked about the appropriateness for ketamine infusions due to her wide spectrum of issues for some time now and I am quite pleased to find out that her treating pain specialist's clinic is now doing ketamine infusions that she appears to be an excellent candidate.  She also appears to be an excellent candidate for spinal cord stimulator trialing and possible implantation.  04/24/2020: The patient returns reporting that she has gotten her spinal cord stimulator trialing device and has it  implanted currently.  However, there have been mechanical problems with the device and it has been shutting off independently and the patient has not been able to get a good reading on how much it has  helped her.  The patient reports that she did experience some brief improvement when she knew that it was on but it would turn off.  She is gone back to the device maker and they could not figure out the problem.  They are going to put a new external device in and extend the spinal cord stimulator trial in face for 2 days.  The patient has also had her first ketamine infusion trial.  She reports that she was told she was hallucinating and dissociative during this phase and experienced some improvement over the rest of the day as far as her mood and pain symptoms but it was over within 24 hours.  The patient is scheduled to have her next ketamine infusion coming up.  08/07/2020: The patient reports that they are looking at doing a new spinal cord stimulator trial as the device did not work properly during her 7-day trial.  In fact, they attempted to extended to 10 days hoping to get it working correctly.  However, the patient reports that she felt like it did help the first day when it was apparently working but shortly stopped working and it sounds like there were never able to get it working properly.  The patient reports that at this point, her anesthesiologist/pain specialist does not think that she should have any more ketamine trials but the patient would like to try them again.  The patient reports that the doctor felt that she had an adverse reaction to it but her husband was present and did not describe any severe reaction.  At this point, she is planning to talk to him again about possibly having another ketamine infusion if possible.  The patient reports that she continues with her significant chronic pain symptoms but she is managing day-to-day.  She reports that there continues to be a major stressor in her life relative to a very close friend who has terminal cancer and the cancer is continuing to progress.  09/11/2020: The patient reports that her friend is now being managed by hospice and appears to  be at the end of her life.  This has been stressful but the patient has been able to adjust to the impending reality with time and she feels like she is handling it fairly well.  The patient reports that she continues to have significant pain in her arms radiating from her neck.  The patient reports that at this point she has not been able to return for ketamine therapies and not discussed strategies to follow-up with [facility].  11/06/2020: The patient has continued to have a lot of stress with depression and anxiety worsening particular around the impending death of a very close friend.  The friend is dying from metastatic cancer and the patient is one of the primary helpers for her friend.  The patient is very close to this friend.  The patient interacts with hospice and is spending a lot of time helping.  This is been coinciding with a worsening of her cervical pain radiating down her right arm primarily.  She continues to struggle with severe pain and the depression and anxiety associated around all of these issues has been worse.  12/11/2020: The patient reports that she has had a very stressful time recently.  Her close friend has passed away and while her friend passed away peacefully and was in the care of hospice at the end of her life there is [employer] "shit show" happening around her friend's death.  The patient reports that the friends son who has been very active in the friend's life tragically died just a couple of days after the friend's death in a motor vehicle accident.  The son who had had little interaction with her friend did show up and there was conflict between the friend's new husband and the son regarding ownership of some of the property.  The friend had completed an extensive well that gave all of her estate to the grandchildren but there was still a great deal of arguing.  The husband had lifetime rights to the house for now.  The patient reports that the death of her  friend's son happened before the funeral and so they ended up having a joint funeral for the son and the patient's friend.  The patient reports that her depression has been very severe and the fact that she is the executor for her friend's estate has had a great deal of stress.  Her pain continues to be very severe.  Today we continue to work on therapeutic interventions for coping and adjustment in dealing with significant depression and recent exacerbations and stress.  05/02/2021:  Patient continues to struggle with pain and has not returned back to [facility] yet for any interventions.  They are still working on plan.  Patient still with stress over friends death and the fall out over friends grandchild etc.  Still a very complicated situation  64/03/3293: The patient reports that she has continued to struggle with her chronic pain symptoms but reports that things have been stable as far as overall psychosocial issues and things are going well with her adopted daughter.  The patient reports that there have been ongoing issues with depression and anxiety.  Today we worked on coping and adjustment issues going forward and strategies to manage the symptoms.  12/26/2021: The patient returns today after 81-monthbreak.  The patient continues to struggle with chronic pain symptoms.  The patient has not returned to see pain management group [facility]  The patient reports that this has helped some but she continues with her significant pain.  The patient has been [place] they felt she was not a good candidate although the patient wanted to continue with these efforts.  I have referred the patient to Dr. RRanell Patrickhere in our office for review about the appropriateness of sublingual ketamine treatment as the patient has verbalized willingness to continue with therapy and this strategy but a different type of very  small doses delivered sublingually.  The patient is hoping that this will also help with her depression and other symptoms that go along with her pain.  I will sit down with Dr. RRanell Patrickand reviewed this prior to the patient's visit that is scheduled on 01/14/2022.   Diagnosis: Complex regional pain syndrome, fibromyalgia, anxiety and depression, neck, shoulder and back pain.

## 2022-01-06 ENCOUNTER — Telehealth (INDEPENDENT_AMBULATORY_CARE_PROVIDER_SITE_OTHER): Payer: Medicare Other | Admitting: Psychology

## 2022-01-06 DIAGNOSIS — F419 Anxiety disorder, unspecified: Secondary | ICD-10-CM

## 2022-01-06 DIAGNOSIS — F5089 Other specified eating disorder: Secondary | ICD-10-CM

## 2022-01-06 DIAGNOSIS — F331 Major depressive disorder, recurrent, moderate: Secondary | ICD-10-CM

## 2022-01-06 NOTE — Progress Notes (Signed)
  Office: [phone]  /  Fax: [phone]    Date: January 06, 2022    Appointment Start Time: 11:01am Duration: 19 minutes Provider: Glennie Isle, Psy.D. Type of Session: Individual Therapy  Location of Patient: Home (private location) Location of Provider: Provider's Home (private office) Type of Contact: Telepsychological Visit via MyChart Video Visit  Session Content: Sally [name] is a 46 y.o. female presenting for a follow-up appointment to address the previously established treatment goal of increasing coping skills.Today's appointment was a telepsychological visit. Sally [name] provided verbal consent for today's telepsychological appointment and she is aware she is responsible for securing confidentiality on her end of the session. Prior to proceeding with today's appointment, Sally [name]'s physical location at the time of this appointment was obtained as well a phone number she could be reached at in the event of technical difficulties. Sally [name] and this provider participated in today's telepsychological service.   This provider conducted a brief check-in. Sally [name] discussed challenges with pain. A risk assessment was completed. Sally [name] denied experiencing suicidal, self-injurious and homicidal ideation, plan, and intent since the last appointment with this provider. She also described future plans that include ongoing efforts to improve her well-being and eating habits. Regarding eating habits, Sally [name] acknowledged decreased appetite due to ongoing pain. Further explored and processed. Reviewed strategies discussed during the last appointment. Sally [name] acknowledged setting reminders to eat; however, noted she still did not always eat when they went off due to sleeping more secondary to the pain. She was engaged in problem solving again to help her eat regularly and ensure she is eating protein while considering the current challenges (pain and subsequent decreased appetite and mobility). Sally [name] was observed writing.  This provider also explored with Sally [name] possible obstacles/barriers. Overall, Sally [name] was receptive to today's appointment as evidenced by openness to sharing, responsiveness to feedback, and willingness to implement discussed strategies .  Mental Status Examination:  Appearance: neat Behavior: appropriate to circumstances Mood: sad Affect: mood congruent Speech: WNL Eye Contact: appropriate Psychomotor Activity: WNL Gait: unable to assess Thought Process: linear, logical, and goal directed and denies suicidal, homicidal, and self-harm ideation, plan and intent  Thought Content/Perception: no hallucinations, delusions, bizarre thinking or behavior endorsed or observed Orientation: AAOx4 Memory/Concentration: memory, attention, language, and fund of knowledge intact  Insight: fair Judgment: fair  Interventions:  Conducted a brief chart review Conducted a risk assessment Provided empathic reflections and validation Employed supportive psychotherapy interventions to facilitate reduced distress and to improve coping skills with identified stressors Engaged patient in problem solving  DSM-5 Diagnosis(es):  F50.89 Other Specified Feeding or Eating Disorder, Emotional Eating Behaviors, F33.1  Major Depressive Disorder, Recurrent Episode, Moderate, and F41.9 Unspecified Anxiety Disorder  Treatment Goal & Progress:  During the initial appointment with this provider, the following treatment goal was established: increase coping skills. Sally [name] has demonstrated progress in her goal as evidenced by increased awareness of hunger patterns and increased awareness of triggers for emotional eating behaviors. Sally [name] also continues to demonstrate willingness to engage in learned skill(s).  Plan: The next appointment is scheduled for 01/21/2022 at 11am, which will be via MyChart Video Visit. The next session will focus on working towards the established treatment goal. Additionally, Sally [name] will continue with her  primary therapist and pain management therapist.

## 2022-01-08 ENCOUNTER — Ambulatory Visit (INDEPENDENT_AMBULATORY_CARE_PROVIDER_SITE_OTHER): Payer: Medicare Other | Admitting: Family Medicine

## 2022-01-08 ENCOUNTER — Encounter (INDEPENDENT_AMBULATORY_CARE_PROVIDER_SITE_OTHER): Payer: Self-pay | Admitting: Family Medicine

## 2022-01-08 VITALS — BP 144/81 | HR 71 | Temp 97.6°F | Ht 68.0 in | Wt 268.0 lb

## 2022-01-08 DIAGNOSIS — R030 Elevated blood-pressure reading, without diagnosis of hypertension: Secondary | ICD-10-CM | POA: Diagnosis not present

## 2022-01-08 DIAGNOSIS — R4701 Aphasia: Secondary | ICD-10-CM | POA: Diagnosis not present

## 2022-01-08 DIAGNOSIS — F3289 Other specified depressive episodes: Secondary | ICD-10-CM

## 2022-01-08 DIAGNOSIS — E669 Obesity, unspecified: Secondary | ICD-10-CM | POA: Diagnosis not present

## 2022-01-08 DIAGNOSIS — Z6841 Body Mass Index (BMI) 40.0 and over, adult: Secondary | ICD-10-CM

## 2022-01-08 MED ORDER — TOPIRAMATE 50 MG PO TABS: 50.0000 mg | ORAL_TABLET | Freq: Every day | ORAL | 0 refills | Status: DC

## 2022-01-14 ENCOUNTER — Encounter: Payer: Self-pay | Admitting: Physical Medicine and Rehabilitation

## 2022-01-14 ENCOUNTER — Encounter (HOSPITAL_BASED_OUTPATIENT_CLINIC_OR_DEPARTMENT_OTHER): Payer: Medicare Other | Admitting: Physical Medicine and Rehabilitation

## 2022-01-14 VITALS — Ht 68.0 in | Wt 272.0 lb

## 2022-01-14 DIAGNOSIS — F419 Anxiety disorder, unspecified: Secondary | ICD-10-CM | POA: Diagnosis not present

## 2022-01-14 DIAGNOSIS — M797 Fibromyalgia: Secondary | ICD-10-CM | POA: Diagnosis not present

## 2022-01-14 DIAGNOSIS — R4189 Other symptoms and signs involving cognitive functions and awareness: Secondary | ICD-10-CM

## 2022-01-14 NOTE — Progress Notes (Signed)
Subjective:    Patient ID: Sally [name], female    DOB: 10/04/1975, 46 y.o.   MRN: [id number]  HPI Mrs. Esther is a 46 year old woman who presents to establish care for fibromyalgia.  1) Fibromyalgia -she is concerned she has MS and asks how she would be checked of this -she gets extremely tired and forgets thinks -she has fallen several times -no sex drive.  -feels weakness in both legs -feels spasticity  2) CRPS -she is taking oxy right now, prescribed by [facility]   Pain Inventory Average Pain 5 Pain Right Now 5 My pain is constant, sharp, burning, tingling, aching, and burns  In the last 24 hours, has pain interfered with the following? General activity 0 Relation with others 0 Enjoyment of life 4 What TIME of day is your pain at its worst? evening and night Sleep (in general) Fair  Pain is worse with: walking, standing, and some activites Pain improves with: rest, medication, TENS, injections, and ice, streching Relief from Meds: 7  use a cane how many minutes can you walk? 30 ability to climb steps?  yes do you drive?  yes Do you have any goals in this area?  yes  disabled: date disabled 2019 mabe I need assistance with the following:  household duties and shopping Do you have any goals in this area?  yes  bladder control problems weakness numbness tingling dizziness confusion depression anxiety  Any changes since last visit?  yes  Any changes since last visit?  no    Family History  Problem Relation Age of Onset   Hypertension Mother    Depression Mother    Anxiety disorder Mother    Obesity Mother    Autoimmune disease Sister    Hypertension Maternal Grandfather    Diabetes Paternal Grandmother    Anesthesia problems Neg Hx    Social History   Socioeconomic History   Marital status: Married    Spouse name: Donnie Aho   Number of children: 2   Years of education: Not on file   Highest education level: Some  college, no degree  Occupational History    Comment: disabled   Occupation: stay at home  Tobacco Use   Smoking status: Every Day    Packs/day: 1.00    Years: 21.00    Total pack years: 21.00    Types: Cigarettes    Start date: 2000   Smokeless tobacco: Never  Vaping Use   Vaping Use: Never used  Substance and Sexual Activity   Alcohol use: Not Currently   Drug use: No   Sexual activity: Yes    Partners: Male    Birth control/protection: Surgical    Comment: TLH  Other Topics Concern   Not on file  Social History Narrative   Lives with husband, son   Caffeine - coffee 4 c daily   Social Determinants of Health   Financial Resource Strain: Not on file  Food Insecurity: Not on file  Transportation Needs: Not on file  Physical Activity: Not on file  Stress: Not on file  Social Connections: Not on file   Past Surgical History:  Procedure Laterality Date   ANTERIOR CERVICAL DECOMP/DISCECTOMY FUSION N/A 10/27/2018   Procedure: ANTERIOR CERVICAL DECOMPRESSION/DISCECTOMY CERVICAL FIVE-SIX, CERVICAL SIX-SEVEN;  Surgeon: Melina Schools, MD;  Location: [place];  Service: Orthopedics;  Laterality: N/A;   BILATERAL SALPINGECTOMY Bilateral 11/05/2015   Procedure: BILATERAL SALPINGECTOMY;  Surgeon: [name], MD;  Location: [facility];  Service: Gynecology;  Laterality: Bilateral;   BRAIN SURGERY  1992   remove blood clot after a fall   [place]  1997, 2013   cyst on ovary     CYSTOSCOPY N/A 11/05/2015   Procedure: CYSTOSCOPY;  Surgeon: [name], MD;  Location: [facility];  Service: Gynecology;  Laterality: N/A;   GASTRIC BANDING PORT REVISION  01/20/2012   Procedure: GASTRIC BANDING PORT REVISION;  Surgeon: Pedro Earls, MD;  Location: [facility];  Service: General;  Laterality: N/A;   KNEE ARTHROSCOPY Left    KNEE ARTHROSCOPY WITH DRILLING/MICROFRACTURE Right 12/06/2014   Procedure: KNEE ARTHROSCOPY WITH DRILLING/MICROFRACTURE, CHONDROPLASTY, EXCISION OF PLICA;  Surgeon:  Dorna Leitz, MD;  Location: [place];  Service: Orthopedics;  Laterality: Right;   KNEE SURGERY Right 06/26/2016   [facility]   LAPAROSCOPIC APPENDECTOMY N/A 09/09/2012   Procedure: APPENDECTOMY LAPAROSCOPIC;  Surgeon: Merrie Roof, MD;  Location: [place];  Service: General;  Laterality: N/A;   [place] N/A 11/05/2015   Procedure: HYSTERECTOMY TOTAL LAPAROSCOPIC;  Surgeon: [name], MD;  Location: [facility];  Service: Gynecology;  Laterality: N/A;   LAPAROSCOPY  08/19/2010   Procedure: LAPAROSCOPY OPERATIVE;  Surgeon: Felipa Emory;  Location: [facility];  Service: Gynecology;  Laterality: N/A;  with Biopsy of uterine serosa   TUBAL LIGATION  08/2011   Past Medical History:  Diagnosis Date   Acute appendicitis 09/09/2012   Anxiety    Back pain    Occ. issues wih back pain   Back pain 09/09/2012   It is intermittent now,  previously source of fibromyalgia dx.     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)    CRPS (complex regional pain syndrome), lower limb 2018   Depression    Edema of both lower extremities    Fibromyalgia    GERD (gastroesophageal reflux disease)    Headache    Hypothyroid 09/09/2012   Hypothyroidism    Joint pain    Osteoarthritis    Palpitation    only when having anxiety   Precancerous changes of the cervix    Sleep apnea    pre lap banding-never used cpap or mask   SOB (shortness of breath)    Swallowing difficulty    Vitamin D deficiency    Ht '5\' 8"'$  (1.727 m)   Wt 272 lb (123.4 kg)   LMP 10/26/2015 (Exact Date)   BMI 41.36 kg/m   Opioid Risk Score:   Fall Risk Score:  `1  Depression screen PHQ 2/9     01/14/2022    2:26 PM 01/10/2021    1:36 PM 08/07/2020    9:55 AM 09/03/2016    9:08 AM 07/09/2016   11:07 AM 04/16/2015    3:02 PM 11/05/2014    2:15 PM  Depression screen PHQ 2/9  Decreased Interest 1 0  2 0 1 0 0  Down, Depressed, Hopeless  1 3 0 1 1 0  PHQ - 2 Score '1 1 5 '$ 0 2 1 0  Altered sleeping 0  1 0 1    Tired, decreased energy 0  3 0 0    Change in appetite 0  2 0 0    Feeling bad or failure about yourself  1  3 0 0    Trouble concentrating 0  1 0  0    Moving slowly or fidgety/restless 0  2 0 0    Suicidal thoughts 0  3 0 0    PHQ-9 Score 2  20 0 3    Difficult doing work/chores   Very difficult        Review of Systems  Genitourinary:  Positive for frequency.  Neurological:  Positive for dizziness, weakness and numbness.  Psychiatric/Behavioral:  Positive for confusion.        Depression, anxiety  All other systems reviewed and are negative.      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  -Discussed benefits of exercise in reducing pain. -encouraged vitamin D supplement --discussed mechanism of action of low dose naltrexone as an opioid receptor antagonist which stimulates your body's production of its own natural endogenous opioids, helping to decrease pain. Discussed that it can also decrease T cell response and thus be helpful in decreasing inflammation, and symptoms of brain fog, fatigue, anxiety, depression, and allergies. Discussed that this medication needs to be compounded at a compounding pharmacy and can more expensive. Discussed that I usually start at '1mg'$  and if this is not providing enough relief then I titrate upward on a monthly basis.    -Provided with a pain relief journal and discussed that it contains foods and lifestyle tips to naturally help to improve pain. Discussed that  these lifestyle strategies are also very good for health unlike some medications which can have negative side effects. Discussed that the act of keeping a journal can be therapeutic and helpful to realize patterns what helps to trigger and alleviate pain.    -prescribed ketamines microdosed daily, discussed risks and benefits of treatment, strategies to maximize success -Discussed following foods that may reduce pain: 1) Ginger (especially studied for arthritis)- reduce leukotriene production to decrease inflammation 2) Blueberries- high in phytonutrients that decrease inflammation 3) Salmon- marine omega-3s reduce joint swelling and pain 4) Pumpkin seeds- reduce inflammation 5) dark chocolate- reduces inflammation 6) turmeric- reduces inflammation 7) tart cherries - reduce pain and stiffness 8) extra virgin olive oil - its compound olecanthal helps to block prostaglandins  9) chili peppers- can be eaten or applied topically via capsaicin 10) mint- helpful for headache, muscle aches, joint pain, and itching 11) garlic- reduces inflammation  Link to further information on diet for chronic pain: [email or web address]    2) Episodes of weakness/cognitive deficits -cervical and brain MRIs ordered to assess for MS

## 2022-01-21 ENCOUNTER — Telehealth (INDEPENDENT_AMBULATORY_CARE_PROVIDER_SITE_OTHER): Payer: Medicare Other | Admitting: Psychology

## 2022-01-21 DIAGNOSIS — F419 Anxiety disorder, unspecified: Secondary | ICD-10-CM | POA: Diagnosis not present

## 2022-01-21 DIAGNOSIS — F5089 Other specified eating disorder: Secondary | ICD-10-CM | POA: Diagnosis not present

## 2022-01-21 DIAGNOSIS — F331 Major depressive disorder, recurrent, moderate: Secondary | ICD-10-CM | POA: Diagnosis not present

## 2022-01-21 NOTE — Progress Notes (Signed)
Office: [phone]  /  Fax: [phone]    Date: January 21, 2022    Appointment Start Time: 11:02am Duration: 19 minutes Provider: Glennie Isle, Psy.D. Type of Session: Individual Therapy  Location of Patient: Home (private location) Location of Provider: Provider's Home (private office) Type of Contact: Telepsychological Visit via MyChart Video Visit  Session Content: Sally [name] is a 47 y.o. female presenting for a follow-up appointment to address the previously established treatment goal of increasing coping skills.Today's appointment was a telepsychological visit. Sally [name] provided verbal consent for today's telepsychological appointment and she is aware she is responsible for securing confidentiality on her end of the session. Prior to proceeding with today's appointment, Sally [name]'s physical location at the time of this appointment was obtained as well a phone number she could be reached at in the event of technical difficulties. Sally [name] and this provider participated in today's telepsychological service.   This provider conducted a brief check-in. Sally [name] shared about recent events, noting she met with a new provider to address cognitive and pain-related concerns. A risk assessment was completed. Sally [name] denied experiencing suicidal, self-injurious and homicidal ideation, plan, and intent since the last appointment with this provider. She also described future plans that include ongoing efforts to improve her well-being and eating habits. Regarding eating habits during the holidays, Sally [name] reported, "It was good."  She described a reduction in emotional eating habits and an improvement in eating more frequently. Psychoeducation regarding mindfulness was provided to further assist with coping. A handout was provided to [facility] with further information regarding mindfulness, including exercises. This provider also explained the benefit of mindfulness as it relates to emotional eating. Sally [name] was encouraged to  engage in the provided exercises between now and the next appointment with this provider. [name] agreed. During today's appointment, Sally [name] was led through a mindfulness exercise involving her senses. Sally [name] provided verbal consent during today's appointment for this provider to send a handout about mindfulness via e-mail. Furthermore, termination planning was discussed. Sally [name] was receptive to a follow-up appointment in 3-4 weeks and an additional follow-up/termination appointment in 3-4 weeks after that. Overall, Sally [name] was receptive to today's appointment as evidenced by openness to sharing, responsiveness to feedback, and willingness to engage in mindfulness exercises to assist with coping.  Mental Status Examination:  Appearance: neat Behavior: appropriate to circumstances Mood: sad Affect: mood congruent Speech: WNL Eye Contact: appropriate Psychomotor Activity: WNL Gait: unable to assess Thought Process: linear, logical, and goal directed and denies suicidal, homicidal, and self-harm ideation, plan and intent  Thought Content/Perception: no hallucinations, delusions, bizarre thinking or behavior endorsed or observed Orientation: AAOx4 Memory/Concentration: memory, attention, language, and fund of knowledge intact  Insight: fair Judgment: fair  Interventions:  Conducted a brief chart review Conducted a risk assessment Provided empathic reflections and validation Employed supportive psychotherapy interventions to facilitate reduced distress and to improve coping skills with identified stressors Psychoeducation provided regarding mindfulness Engaged patient in mindfulness exercise(s) Discussed termination planning  DSM-5 Diagnosis(es):  F50.89 Other Specified Feeding or Eating Disorder, Emotional Eating Behaviors, F33.1  Major Depressive Disorder, Recurrent Episode, Moderate, and F41.9 Unspecified Anxiety Disorder  Treatment Goal & Progress: During the initial appointment with this  provider, the following treatment goal was established: increase coping skills. Sally [name] has demonstrated progress in her goal as evidenced by increased awareness of hunger patterns, increased awareness of triggers for emotional eating behaviors, and reduction in emotional eating behaviors . Sally [name] also continues to demonstrate willingness to engage in learned skill(s).  Plan: The next appointment is  scheduled for 02/11/2022 at 11am, which will be via MyChart Video Visit. The next session will focus on working towards the established treatment goal. Additionally, Sally [name] will continue with her primary therapist and pain management therapist.

## 2022-01-28 NOTE — Progress Notes (Signed)
Chief Complaint:   OBESITY Sally [name] is here to discuss her progress with her obesity treatment plan along with follow-up of her obesity related diagnoses. Sally [name] is on keeping a food journal and adhering to recommended goals of 1400 calories and 85 grams of protein and states she is following her eating plan approximately (unknown)% of the time. Sally [name] states she is doing 0 minutes 0 times per week.  Today's visit was #: 21 Starting weight: 273 lbs Starting date: 08/07/2020 Today's weight: 268 lbs Today's date: 01/08/2022 Total lbs lost to date: 5 Total lbs lost since last in-office visit: 2  Interim History: Sally [name] has been dealing with a lot of pain.  She has not been able to concentrate on her diet.  Subjective:   1. Elevated blood pressure reading without diagnosis of hypertension Sally [name]'s blood pressure is elevated today.  She feels this is due to her worsening pain.  2. Aphasia Sally [name] had an episode recently where she was in bed and felt her head vibrating.  She thought she was yelling but her family states she was speaking incoherently.  This lasted approximately 10 seconds and this has not returned.  She suspects that she has MS and she is waiting for her next PCP appointment to get a Neurology referral.  Sally [name] is working on decreasing simple carbohydrates and she is making strategies to do better, but she is still struggling at times.  Assessment/Plan:   1. Elevated blood pressure reading without diagnosis of hypertension Sally [name] will continue to work on her weight loss, and we will recheck her blood pressure in 1 month.  2. Aphasia Sally [name] was encouraged to call her PCP and get a referral to Neurology as soon as possible to have these complaints evaluated properly.  We will continue to follow.  3. Emotional Eating Behavior We will refill Topamax 50 mg once daily for 1 month. Sally [name] will continue to follow-up with Sally [name].  - topiramate  (TOPAMAX) 50 MG tablet; Take 1 tablet (50 mg total) by mouth daily.  Dispense: 30 tablet; Refill: 0  4. Obesity, Current BMI 40.8 Sally [name] is currently in the action stage of change. As such, her goal is to continue with weight loss efforts. She has agreed to keeping a food journal and adhering to recommended goals of 1400 calories and 80+ grams of protein daily.   Behavioral modification strategies: increasing lean protein intake and holiday eating strategies .  Sally [name] has agreed to follow-up with our clinic in 4 weeks. She was informed of the importance of frequent follow-up visits to maximize her success with intensive lifestyle modifications for her multiple health conditions.   Objective:   Blood pressure (!) 144/81, pulse 71, temperature 97.6 F (36.4 C), height '5\' 8"'$  (1.727 m), weight 268 lb (121.6 kg), last menstrual period 10/26/2015, SpO2 97 %. Body mass index is 40.75 kg/m.  General: Cooperative, alert, well developed, in no acute distress. HEENT: Conjunctivae and lids unremarkable. Cardiovascular: Regular rhythm.  Lungs: Normal work of breathing. Neurologic: No focal deficits.   Lab Results  Component Value Date   CREATININE 0.73 09/08/2021   BUN 13 09/08/2021   NA 141 09/08/2021   K 4.1 09/08/2021   CL 107 09/08/2021   CO2 27 09/08/2021   Lab Results  Component Value Date   ALT 17 05/06/2021   AST 17 05/06/2021   ALKPHOS 92 05/06/2021   BILITOT 0.2 05/06/2021   Lab Results  Component Value Date  HGBA1C 5.5 05/06/2021   HGBA1C 5.3 08/07/2020   HGBA1C 5.3 04/03/2015   Lab Results  Component Value Date   INSULIN 4.2 05/06/2021   INSULIN 4.9 08/07/2020   Lab Results  Component Value Date   TSH 0.010 (L) 05/06/2021   Lab Results  Component Value Date   CHOL 151 05/06/2021   HDL 44 05/06/2021   LDLCALC 92 05/06/2021   TRIG 75 05/06/2021   CHOLHDL 5 09/03/2016   Lab Results  Component Value Date   VD25OH 45.4 05/06/2021   VD25OH 34 01/08/2016    VD25OH 21 (L) 10/19/2015   Lab Results  Component Value Date   WBC 8.1 09/08/2021   HGB 14.2 09/08/2021   HCT 43.6 09/08/2021   MCV 95.4 09/08/2021   PLT 256 09/08/2021   Lab Results  Component Value Date   FERRITIN 67 07/27/2014   Attestation Statements:   Reviewed by clinician on day of visit: allergies, medications, problem list, medical history, surgical history, family history, social history, and previous encounter notes.   I, Trixie Dredge, am acting as transcriptionist for [name], MD.  I have reviewed the above documentation for accuracy and completeness, and I agree with the above. -  [name], MD

## 2022-01-29 ENCOUNTER — Ambulatory Visit (INDEPENDENT_AMBULATORY_CARE_PROVIDER_SITE_OTHER): Payer: Medicare Other | Admitting: Family Medicine

## 2022-01-30 ENCOUNTER — Encounter (INDEPENDENT_AMBULATORY_CARE_PROVIDER_SITE_OTHER): Payer: Self-pay | Admitting: Family Medicine

## 2022-01-30 ENCOUNTER — Ambulatory Visit (INDEPENDENT_AMBULATORY_CARE_PROVIDER_SITE_OTHER): Payer: Medicare Other | Admitting: Family Medicine

## 2022-01-30 ENCOUNTER — Encounter: Payer: Self-pay | Admitting: Physical Medicine and Rehabilitation

## 2022-01-30 VITALS — BP 129/69 | HR 87 | Temp 98.1°F | Ht 68.0 in | Wt 265.0 lb

## 2022-01-30 DIAGNOSIS — R531 Weakness: Secondary | ICD-10-CM

## 2022-01-30 DIAGNOSIS — Z6841 Body Mass Index (BMI) 40.0 and over, adult: Secondary | ICD-10-CM | POA: Diagnosis not present

## 2022-01-30 DIAGNOSIS — E88819 Insulin resistance, unspecified: Secondary | ICD-10-CM

## 2022-01-30 DIAGNOSIS — R4189 Other symptoms and signs involving cognitive functions and awareness: Secondary | ICD-10-CM

## 2022-01-30 DIAGNOSIS — F3289 Other specified depressive episodes: Secondary | ICD-10-CM | POA: Diagnosis not present

## 2022-01-30 DIAGNOSIS — E669 Obesity, unspecified: Secondary | ICD-10-CM | POA: Diagnosis not present

## 2022-01-30 MED ORDER — METFORMIN HCL 500 MG PO TABS: 500.0000 mg | ORAL_TABLET | Freq: Every day | ORAL | 0 refills | Status: DC

## 2022-01-30 MED ORDER — TOPIRAMATE 50 MG PO TABS: 50.0000 mg | ORAL_TABLET | Freq: Every day | ORAL | 0 refills | Status: DC

## 2022-02-06 ENCOUNTER — Telehealth (INDEPENDENT_AMBULATORY_CARE_PROVIDER_SITE_OTHER): Payer: Self-pay

## 2022-02-06 NOTE — Telephone Encounter (Signed)
Submitted PA for Metformin, awaiting a determination.

## 2022-02-10 NOTE — Progress Notes (Unsigned)
Chief Complaint:   OBESITY Sally [name] is here to discuss her progress with her obesity treatment plan along with follow-up of her obesity related diagnoses. Sally [name] is on keeping a food journal and adhering to recommended goals of 1400 calories and 80+ grams of protein and states she is following her eating plan approximately 60% of the time. Sally [name] states she is walking the dog for 1 hour and 15 minutes 7 times per week.    Today's visit was #: 22 Starting weight: 273 lbs Starting date: 08/07/2020 Today's weight: 265 lbs Today's date: 01/30/2022 Total lbs lost to date: 8 Total lbs lost since last in-office visit: 3  Interim History: Sally [name] did well with avoiding holiday weight gain.  She tried to avoid snacking.  She is working on increasing protein but she has ways to go.  Subjective:   1. Insulin resistance Sally [name] is on metformin and she is doing better with decreasing simple carbohydrates.  She denies nausea or vomiting.  2. Emotional Eating Behavior Sally [name] is working on decreasing emotional eating behaviors, and she is doing well on Topamax.  No side effects were noted.  Assessment/Plan:   1. Insulin resistance We will refill metformin for 1 month. Sally [name] will continue to work on weight loss, exercise, and decreasing simple carbohydrates to help decrease the risk of diabetes. Sally [name] agreed to follow-up with [place] as directed to closely monitor her progress.  - metFORMIN (GLUCOPHAGE) 500 MG tablet; Take 1 tablet (500 mg total) by mouth daily with breakfast.  Dispense: 30 tablet; Refill: 0  2. Emotional Eating Behavior Sally [name] will continue Topamax, and we will refill for 1 month.  - topiramate (TOPAMAX) 50 MG tablet; Take 1 tablet (50 mg total) by mouth daily.  Dispense: 30 tablet; Refill: 0  3. Obesity,current BMI 40.4 Sally [name] is currently in the action stage of change. As such, her goal is to continue with weight loss efforts. She has agreed to keeping a food journal and adhering to  recommended goals of 1400 calories and 80+ grams of protein daily.   We will recheck fasting labs at her next visit.   Exercise goals: Look into wall pilates and chair yoga.   Behavioral modification strategies: increasing lean protein intake.  Sally [name] has agreed to follow-up with our clinic in 4 weeks. She was informed of the importance of frequent follow-up visits to maximize her success with intensive lifestyle modifications for her multiple health conditions.   Objective:   Blood pressure 129/69, pulse 87, temperature 98.1 F (36.7 C), height '5\' 8"'$  (1.727 m), weight 265 lb (120.2 kg), last menstrual period 10/26/2015, SpO2 100 %. Body mass index is 40.29 kg/m.  General: Cooperative, alert, well developed, in no acute distress. HEENT: Conjunctivae and lids unremarkable. Cardiovascular: Regular rhythm.  Lungs: Normal work of breathing. Neurologic: No focal deficits.   Lab Results  Component Value Date   CREATININE 0.73 09/08/2021   BUN 13 09/08/2021   NA 141 09/08/2021   K 4.1 09/08/2021   CL 107 09/08/2021   CO2 27 09/08/2021   Lab Results  Component Value Date   ALT 17 05/06/2021   AST 17 05/06/2021   ALKPHOS 92 05/06/2021   BILITOT 0.2 05/06/2021   Lab Results  Component Value Date   HGBA1C 5.5 05/06/2021   HGBA1C 5.3 08/07/2020   HGBA1C 5.3 04/03/2015   Lab Results  Component Value Date   INSULIN 4.2 05/06/2021   INSULIN 4.9 08/07/2020   Lab Results  Component Value Date  TSH 0.010 (L) 05/06/2021   Lab Results  Component Value Date   CHOL 151 05/06/2021   HDL 44 05/06/2021   LDLCALC 92 05/06/2021   TRIG 75 05/06/2021   CHOLHDL 5 09/03/2016   Lab Results  Component Value Date   VD25OH 45.4 05/06/2021   VD25OH 34 01/08/2016   VD25OH 21 (L) 10/19/2015   Lab Results  Component Value Date   WBC 8.1 09/08/2021   HGB 14.2 09/08/2021   HCT 43.6 09/08/2021   MCV 95.4 09/08/2021   PLT 256 09/08/2021   Lab Results  Component Value Date    FERRITIN 67 07/27/2014   Attestation Statements:   Reviewed by clinician on day of visit: allergies, medications, problem list, medical history, surgical history, family history, social history, and previous encounter notes.   I, Trixie Dredge, am acting as transcriptionist for [name], MD.  I have reviewed the above documentation for accuracy and completeness, and I agree with the above. -  [name], MD

## 2022-02-11 ENCOUNTER — Telehealth (INDEPENDENT_AMBULATORY_CARE_PROVIDER_SITE_OTHER): Payer: Medicare Other | Admitting: Psychology

## 2022-02-11 DIAGNOSIS — F5089 Other specified eating disorder: Secondary | ICD-10-CM

## 2022-02-11 DIAGNOSIS — F419 Anxiety disorder, unspecified: Secondary | ICD-10-CM

## 2022-02-11 DIAGNOSIS — F331 Major depressive disorder, recurrent, moderate: Secondary | ICD-10-CM | POA: Diagnosis not present

## 2022-02-11 NOTE — Progress Notes (Signed)
  Office: [phone]  /  Fax: [phone]    Date: February 11, 2022    Appointment Start Time: 11:03am Duration: 22 minutes Provider: Glennie Isle, Psy.D. Type of Session: Individual Therapy  Location of Patient: Home (private location) Location of Provider: Provider's Home (private office) Type of Contact: Telepsychological Visit via MyChart Video Visit  Session Content: Sally [name] is a 47 y.o. female presenting for a follow-up appointment to address the previously established treatment goal of increasing coping skills.Today's appointment was a telepsychological visit. Sally [name] provided verbal consent for today's telepsychological appointment and she is aware she is responsible for securing confidentiality on her end of the session. Prior to proceeding with today's appointment, Sally [name]'s physical location at the time of this appointment was obtained as well a phone number she could be reached at in the event of technical difficulties. Sally [name] and this provider participated in today's telepsychological service.   This provider conducted a brief check-in. Sally [name] reported, "I'm okay." She shared about her recent appointment with Dr. Leafy Ro. Session focused further on mindfulness to assist with coping. She discussed engaging in shared exercises after the last appointment with this provider. Psychoeducation regarding formal (e.g., setting aside a specific time daily to engage in an exercise) and informal (e.g., cultivating awareness in the present moment and taking a non-judgmental approach while engaging in day-to-day tasks) mindfulness was provided. Sally [name] was led through a mindfulness exercise (A Taste of Mindfulness) and her experience was processed. Sally [name] provided verbal consent during today's appointment for this provider to send a handout for today's exercises via e-mail. This provider also discussed the utilization of YouTube for mindfulness exercises (e.g., exercises by Merri Ray). Overall, Sally [name] was  receptive to today's appointment as evidenced by openness to sharing, responsiveness to feedback, and willingness to continue engaging in mindfulness exercises.  Mental Status Examination:  Appearance: neat Behavior: appropriate to circumstances Mood: neutral Affect: mood congruent Speech: WNL Eye Contact: appropriate Psychomotor Activity: WNL Gait: unable to assess Thought Process: linear, logical, and goal directed and denies suicidal, homicidal, and self-harm ideation, plan and intent since the last appointment with this provider  Thought Content/Perception: no hallucinations, delusions, bizarre thinking or behavior endorsed or observed Orientation: AAOx4 Memory/Concentration: memory, attention, language, and fund of knowledge intact  Insight: fair Judgment: fair  Interventions:  Conducted a brief chart review Conducted a risk assessment Provided empathic reflections and validation Reviewed content from the previous session Provided positive reinforcement Employed supportive psychotherapy interventions to facilitate reduced distress and to improve coping skills with identified stressors Engaged patient in mindfulness exercise(s)  DSM-5 Diagnosis(es):  F50.89 Other Specified Feeding or Eating Disorder, Emotional Eating Behaviors, F33.1  Major Depressive Disorder, Recurrent Episode, Moderate, and F41.9 Unspecified Anxiety Disorder  Treatment Goal & Progress: During the initial appointment with this provider, the following treatment goal was established: increase coping skills. Sally [name] has demonstrated progress in her goal as evidenced by increased awareness of hunger patterns, increased awareness of triggers for emotional eating behaviors, and reduction in emotional eating behaviors . Sally [name] also continues to demonstrate willingness to engage in learned skill(s).  Plan: The next appointment is scheduled for 03/11/2022 at 11am, which will be via MyChart Video Visit. The next session will  focus on working towards the established treatment goal and termination. Additionally, Sally [name] will continue with her primary therapist and pain management therapist.

## 2022-03-04 ENCOUNTER — Other Ambulatory Visit: Payer: Self-pay | Admitting: Physical Medicine and Rehabilitation

## 2022-03-05 ENCOUNTER — Ambulatory Visit (INDEPENDENT_AMBULATORY_CARE_PROVIDER_SITE_OTHER): Payer: Medicare Other | Admitting: Family Medicine

## 2022-03-11 ENCOUNTER — Encounter (INDEPENDENT_AMBULATORY_CARE_PROVIDER_SITE_OTHER): Payer: Self-pay | Admitting: Family Medicine

## 2022-03-11 ENCOUNTER — Telehealth (INDEPENDENT_AMBULATORY_CARE_PROVIDER_SITE_OTHER): Payer: Medicare Other | Admitting: Family Medicine

## 2022-03-11 ENCOUNTER — Telehealth (INDEPENDENT_AMBULATORY_CARE_PROVIDER_SITE_OTHER): Payer: Medicare Other | Admitting: Psychology

## 2022-03-11 DIAGNOSIS — Z6841 Body Mass Index (BMI) 40.0 and over, adult: Secondary | ICD-10-CM

## 2022-03-11 DIAGNOSIS — F419 Anxiety disorder, unspecified: Secondary | ICD-10-CM | POA: Diagnosis not present

## 2022-03-11 DIAGNOSIS — F3289 Other specified depressive episodes: Secondary | ICD-10-CM

## 2022-03-11 DIAGNOSIS — F5089 Other specified eating disorder: Secondary | ICD-10-CM | POA: Diagnosis not present

## 2022-03-11 DIAGNOSIS — F331 Major depressive disorder, recurrent, moderate: Secondary | ICD-10-CM

## 2022-03-11 DIAGNOSIS — E88819 Insulin resistance, unspecified: Secondary | ICD-10-CM

## 2022-03-11 MED ORDER — METFORMIN HCL 500 MG PO TABS: 500.0000 mg | ORAL_TABLET | Freq: Two times a day (BID) | ORAL | 0 refills | Status: DC

## 2022-03-11 MED ORDER — TOPIRAMATE 50 MG PO TABS: 50.0000 mg | ORAL_TABLET | Freq: Every day | ORAL | 0 refills | Status: DC

## 2022-03-11 NOTE — Progress Notes (Signed)
TeleHealth Visit:  This visit was completed with telemedicine (audio/video) technology. Sally [name] has verbally consented to this TeleHealth visit. The patient is located at home, the provider is located at home. The participants in this visit include the listed provider and patient. The visit was conducted today via MyChart video.  OBESITY Sally [name] is here to discuss her progress with her obesity treatment plan along with follow-up of her obesity related diagnoses.   Today's visit was # 23 Starting weight: 273 lbs Starting date: 08/07/2020 Weight at last in office visit: 265 lbs on 01/30/22 Total weight loss: 8 lbs at last in office visit on 01/30/22. Today's reported weight:  No weight reported.  Nutrition Plan: keeping a food journal with goal of 1400 calories and 80+ grams of protein daily.  Current exercise:  walking the dog several times per day 7 times per week.    Interim History:  She deals with chronic pain and has had an exacerbation which has increased choice of "convenient" foods.  She has not been journaling.  Generally has 1 meal per day (dinner) and mostly snacks the rest of the day.  Hunger/cravings are an issue for her, especially at night.  She does not feel that the metformin or the topiramate have helped. Drinks sweet tea.  She had been on Wegovy in 2023 but stopped because she was having hypoglycemic episodes.  Assessment/Plan:  1. Emotional eating behavior Sally [name] has had issues with stress/emotional eating.  Currently seeing Dr. Mallie Mussel and has a follow-up today. Currently this is poorly controlled. Overall mood is stable.  Medication(s): Topiramate 50 mg daily-unsure if this is helping.  Plan: Refill topiramate 50 mg daily. Consider increasing dose next visit.  2. Insulin Resistance She reports polyphagia and cravings.  Admits to snacking throughout the day, often on carbohydrates.  Sweet tea at home. Medication(s): Metformin 500 mg daily at breakfast Lab  Results  Component Value Date   HGBA1C 5.5 05/06/2021   Lab Results  Component Value Date   INSULIN 4.2 05/06/2021   INSULIN 4.9 08/07/2020    Plan This dose and refill-metformin 500 mg twice daily with meals.  She will take at lunch and dinner.   3.  Morbid obesity: Current BMI 40.3 Sally [name] is currently in the action stage of change. As such, her goal is to continue with weight loss efforts.  She has agreed to keeping a food journal with goal of 1400 calories and 80+ grams of protein daily.  1.  Handout sent via MyChart-100/200 snacks, premade breakfast options 2.  Incorporate 1 protein shake per day-Premier or fair life 3.  Encouraged at least 2 meals per day with 1 shake. 4.  Cut back on sweet tea.  Try unsweetened tea with artificial sweetener.  Exercise goals:  as is  Behavioral modification strategies: increasing lean protein intake, decreasing simple carbohydrates , no meal skipping, meal planning , better snacking choices, planning for success, decrease snacking , and keep a strict food journal.  Sally [name] has agreed to follow-up with our clinic in 3 weeks, staying.   No orders of the defined types were placed in this encounter.   Medications Discontinued During This Encounter  Medication Reason   topiramate (TOPAMAX) 50 MG tablet Reorder   metFORMIN (GLUCOPHAGE) 500 MG tablet Reorder     Meds ordered this encounter  Medications   topiramate (TOPAMAX) 50 MG tablet    Sig: Take 1 tablet (50 mg total) by mouth daily.    Dispense:  30 tablet  Refill:  0    Order Specific Question:   Supervising Provider    Answer:   Netty Starring   metFORMIN (GLUCOPHAGE) 500 MG tablet    Sig: Take 1 tablet (500 mg total) by mouth 2 (two) times daily with a meal.    Dispense:  60 tablet    Refill:  0    Order Specific Question:   Supervising Provider    Answer:   Dell Ponto [[id number]]      Objective:   VITALS: Per patient if applicable, see vitals. GENERAL: Alert and  in no acute distress. CARDIOPULMONARY: No increased WOB. Speaking in clear sentences.  PSYCH: Pleasant and cooperative. Speech normal rate and rhythm. Affect is appropriate. Insight and judgement are appropriate. Attention is focused, linear, and appropriate.  NEURO: Oriented as arrived to appointment on time with no prompting.   Lab Results  Component Value Date   CREATININE 0.73 09/08/2021   BUN 13 09/08/2021   NA 141 09/08/2021   K 4.1 09/08/2021   CL 107 09/08/2021   CO2 27 09/08/2021   Lab Results  Component Value Date   ALT 17 05/06/2021   AST 17 05/06/2021   ALKPHOS 92 05/06/2021   BILITOT 0.2 05/06/2021   Lab Results  Component Value Date   HGBA1C 5.5 05/06/2021   HGBA1C 5.3 08/07/2020   HGBA1C 5.3 04/03/2015   Lab Results  Component Value Date   INSULIN 4.2 05/06/2021   INSULIN 4.9 08/07/2020   Lab Results  Component Value Date   TSH 0.010 (L) 05/06/2021   Lab Results  Component Value Date   CHOL 151 05/06/2021   HDL 44 05/06/2021   LDLCALC 92 05/06/2021   TRIG 75 05/06/2021   CHOLHDL 5 09/03/2016   Lab Results  Component Value Date   WBC 8.1 09/08/2021   HGB 14.2 09/08/2021   HCT 43.6 09/08/2021   MCV 95.4 09/08/2021   PLT 256 09/08/2021   Lab Results  Component Value Date   FERRITIN 67 07/27/2014   Lab Results  Component Value Date   VD25OH 45.4 05/06/2021   VD25OH 34 01/08/2016   VD25OH 21 (L) 10/19/2015    Attestation Statements:   Reviewed by clinician on day of visit: allergies, medications, problem list, medical history, surgical history, family history, social history, and previous encounter notes.   This was prepared with the assistance of [employer].  Occasional wrong-word or sound-a-like substitutions may have occurred due to the inherent limitations of voice recognition software.

## 2022-03-11 NOTE — Progress Notes (Signed)
  Office: [phone]  /  Fax: [phone]    Date: March 11, 2022    Appointment Start Time: 11:03am Duration: 22 minutes Provider: Glennie Isle, Psy.D. Type of Session: Individual Therapy  Location of Patient: Home (private location) Location of Provider: Provider's Home (private office) Type of Contact: Telepsychological Visit via MyChart Video Visit  Session Content: Sally [name] is a 47 y.o. female presenting for a follow-up appointment to address the previously established treatment goal of increasing coping skills.Today's appointment was a telepsychological visit. Sally [name] provided verbal consent for today's telepsychological appointment and she is aware she is responsible for securing confidentiality on her end of the session. Prior to proceeding with today's appointment, Sally [name]'s physical location at the time of this appointment was obtained as well a phone number she could be reached at in the event of technical difficulties. Sally [name] and this provider participated in today's telepsychological service.   This provider conducted a brief check-in. Sally [name] stated it has been "rough" due to "pain and flare up issues." Further explored and processed. She stated her providers are aware and she will be pursuing an MRI on Thursday to determine if she has MS. Regarding eating, Sally [name] discussed having "good days" and "bad days." Further explored and discussed. She indicated eating foods that are "convenient," but continued to report a reduction in emotional eating behaviors. Notably, she disclosed she is not journaling. As such, psychoeducation regarding SMART goals was provided and Li was engaged in goal setting. The following goal was established: Joyous will journal using [employer] at least 3 out of 7 days a week between now and the next appointment with this provider. Overall, Sally [name] was receptive to today's appointment as evidenced by openness to sharing, responsiveness to feedback, and willingness to  work toward the established SMART goal.  Mental Status Examination:  Appearance: neat Behavior: appropriate to circumstances Mood: sad Affect: mood congruent Speech: WNL Eye Contact: appropriate Psychomotor Activity: WNL Gait: unable to assess Thought Process: linear, logical, and goal directed and denies suicidal, homicidal, and self-harm ideation, plan and intent since the last appointment with this provider  Thought Content/Perception: no hallucinations, delusions, bizarre thinking or behavior endorsed or observed Orientation: AAOx4 Memory/Concentration: memory, attention, language, and fund of knowledge intact  Insight: good Judgment: good  Interventions:  Conducted a brief chart review Provided empathic reflections and validation Employed supportive psychotherapy interventions to facilitate reduced distress and to improve coping skills with identified stressors Engaged patient in goal setting Psychoeducation provided regarding SMART goals  DSM-5 Diagnosis(es):  F50.89 Other Specified Feeding or Eating Disorder, Emotional Eating Behaviors, F33.1  Major Depressive Disorder, Recurrent Episode, Moderate, and F41.9 Unspecified Anxiety Disorder  Treatment Goal & Progress: During the initial appointment with this provider, the following treatment goal was established: increase coping skills. Sally [name] has demonstrated progress in her goal as evidenced by increased awareness of hunger patterns, increased awareness of triggers for emotional eating behaviors, and reduction in emotional eating behaviors . Sally [name] also continues to demonstrate willingness to engage in learned skill(s).   Plan: Given recent events, an additional follow-up appointment was recommended. The next appointment is scheduled for 03/25/2022 at 11am, which will be via MyChart Video Visit. The next session will focus on working towards the established treatment goal. Additionally, Sally [name] will continue with her primary therapist and  pain management therapist.

## 2022-03-12 ENCOUNTER — Ambulatory Visit (INDEPENDENT_AMBULATORY_CARE_PROVIDER_SITE_OTHER): Payer: Medicare Other | Admitting: Family Medicine

## 2022-03-13 ENCOUNTER — Ambulatory Visit (HOSPITAL_COMMUNITY)
Admission: RE | Admit: 2022-03-13 | Discharge: 2022-03-13 | Disposition: A | Discharge status: Home / Self Care | Payer: Medicare Other | Source: Ambulatory Visit | Attending: Physical Medicine and Rehabilitation | Admitting: Physical Medicine and Rehabilitation

## 2022-03-13 DIAGNOSIS — R4189 Other symptoms and signs involving cognitive functions and awareness: Secondary | ICD-10-CM | POA: Diagnosis present

## 2022-03-13 DIAGNOSIS — R531 Weakness: Secondary | ICD-10-CM | POA: Insufficient documentation

## 2022-03-17 ENCOUNTER — Encounter: Payer: No Typology Code available for payment source | Admitting: Physical Medicine and Rehabilitation

## 2022-03-17 ENCOUNTER — Encounter
Payer: Medicare Other | Attending: Physical Medicine and Rehabilitation | Admitting: Physical Medicine and Rehabilitation

## 2022-03-17 ENCOUNTER — Encounter: Payer: Self-pay | Admitting: Physical Medicine and Rehabilitation

## 2022-03-17 DIAGNOSIS — R351 Nocturia: Secondary | ICD-10-CM

## 2022-03-17 DIAGNOSIS — R531 Weakness: Secondary | ICD-10-CM | POA: Diagnosis not present

## 2022-03-17 DIAGNOSIS — R4189 Other symptoms and signs involving cognitive functions and awareness: Secondary | ICD-10-CM | POA: Diagnosis not present
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
## 2022-03-25 ENCOUNTER — Telehealth (INDEPENDENT_AMBULATORY_CARE_PROVIDER_SITE_OTHER): Payer: Medicare Other | Admitting: Psychology

## 2022-03-25 DIAGNOSIS — F5089 Other specified eating disorder: Secondary | ICD-10-CM

## 2022-03-25 DIAGNOSIS — F419 Anxiety disorder, unspecified: Secondary | ICD-10-CM

## 2022-03-25 DIAGNOSIS — F331 Major depressive disorder, recurrent, moderate: Secondary | ICD-10-CM

## 2022-03-25 NOTE — Progress Notes (Signed)
  Office: [phone]  /  Fax: [phone]    Date: March 25, 2022    Appointment Start Time: 11:01am Duration: 18 minutes Provider: Glennie Isle, Psy.D. Type of Session: Individual Therapy  Location of Patient: Home (private location) Location of Provider: Provider's Home (private office) Type of Contact: Telepsychological Visit via MyChart Video Visit  Session Content: Chaylin is a 47 y.o. female presenting for a follow-up appointment to address the previously established treatment goal of increasing coping skills.Today's appointment was a telepsychological visit. Nancyann provided verbal consent for today's telepsychological appointment and she is aware she is responsible for securing confidentiality on her end of the session. Prior to proceeding with today's appointment, Charlet's physical location at the time of this appointment was obtained as well a phone number she could be reached at in the event of technical difficulties. Loreal and this provider participated in today's telepsychological service.   This provider conducted a brief check-in. Aarielle reported she had COVID and now she has a sinus infection. She indicated a plan to discuss with Dr. Leafy Ro her desire to take a "break" from the clinic. Further explored and processed. A plan was developed to help Dawnisha cope with emotional eating behaviors in the future using learned skills. She wrote down the following plan: stay busy (e.g., crafting, spending time with dogs, cleaning); focus on hydration; be prepared with snacks congruent to calorie/protein goals; pause to ask questions when triggered to eat (e.g., Am I really hungry?, Is there something bothering me?, and Will I feel better if I eat?); and engage in discussed coping strategies after going through the aforementioned questions. Overall, Maddalynn was receptive to today's appointment as evidenced by openness to sharing, responsiveness to feedback, and willingness to continue engaging in  learned skills.  Mental Status Examination:  Appearance: neat Behavior: appropriate to circumstances Mood: neutral Affect: mood congruent Speech: WNL Eye Contact: appropriate Psychomotor Activity: WNL Gait: unable to assess Thought Process:  linear, logical, and goal directed and denies suicidal, homicidal, and self-harm ideation, plan and intent since the last appointment with this provider   Thought Content/Perception: no hallucinations, delusions, bizarre thinking or behavior endorsed or observed Orientation: AAOx4 Memory/Concentration: memory, attention, language, and fund of knowledge intact  Insight: good Judgment: good  Interventions:  Conducted a brief chart review Provided empathic reflections and validation Provided positive reinforcement Employed supportive psychotherapy interventions to facilitate reduced distress and to improve coping skills with identified stressors Reviewed learned skills  DSM-5 Diagnosis(es):  F50.89 Other Specified Feeding or Eating Disorder, Emotional Eating Behaviors, F33.1  Major Depressive Disorder, Recurrent Episode, Moderate, and F41.9 Unspecified Anxiety Disorder  Treatment Goal & Progress: During the initial appointment with this provider, the following treatment goal was established: increase coping skills. Josalynn demonstrated progress in her goal as evidenced by increased awareness of hunger patterns, increased awareness of triggers for emotional eating behaviors, and reduction in emotional eating behaviors . Lodena also continues to demonstrate willingness to engage in learned skill(s).  Plan: As previously planned, today was Emmaly's last appointment with this provider. She acknowledged understanding that she may request a follow-up appointment with this provider in the future as long as she is still established with the clinic. No further follow-up planned by this provider. Additionally, Autum will continue with her primary therapist and pain  management therapist.

## 2022-03-26 ENCOUNTER — Ambulatory Visit (INDEPENDENT_AMBULATORY_CARE_PROVIDER_SITE_OTHER): Payer: Medicare Other | Admitting: Physician Assistant

## 2022-03-26 ENCOUNTER — Encounter (INDEPENDENT_AMBULATORY_CARE_PROVIDER_SITE_OTHER): Payer: Self-pay | Admitting: Physician Assistant

## 2022-03-26 VITALS — BP 146/83 | HR 79 | Temp 98.2°F | Ht 68.0 in | Wt 270.0 lb

## 2022-03-26 DIAGNOSIS — E559 Vitamin D deficiency, unspecified: Secondary | ICD-10-CM

## 2022-03-26 DIAGNOSIS — Z6841 Body Mass Index (BMI) 40.0 and over, adult: Secondary | ICD-10-CM

## 2022-03-26 DIAGNOSIS — E88819 Insulin resistance, unspecified: Secondary | ICD-10-CM

## 2022-03-26 DIAGNOSIS — E669 Obesity, unspecified: Secondary | ICD-10-CM

## 2022-03-26 DIAGNOSIS — F3289 Other specified depressive episodes: Secondary | ICD-10-CM

## 2022-03-26 MED ORDER — VITAMIN D (ERGOCALCIFEROL) 1.25 MG (50000 UNIT) PO CAPS: ORAL_CAPSULE | ORAL | 0 refills | Status: AC

## 2022-03-26 MED ORDER — TOPIRAMATE 50 MG PO TABS: 50.0000 mg | ORAL_TABLET | Freq: Every day | ORAL | 0 refills | Status: DC

## 2022-03-26 MED ORDER — METFORMIN HCL 500 MG PO TABS: 500.0000 mg | ORAL_TABLET | Freq: Two times a day (BID) | ORAL | 0 refills | Status: DC

## 2022-03-26 NOTE — Progress Notes (Signed)
Office: [phone]  /  Fax: [phone]  WEIGHT SUMMARY AND BIOMETRICS  Vitals Temp: 98.2 F (36.8 C) BP: (!) 146/83 Pulse Rate: 79 SpO2: 98 %   Anthropometric Measurements Height: '5\' 8"'$  (1.727 m) Weight: 270 lb (122.5 kg) BMI (Calculated): 41.06 Weight at Last Visit: 265 lb Weight Lost Since Last Visit: 0 Starting Weight: 273 lb   Body Composition  Body Fat %: 52 % Fat Mass (lbs): 140.6 lbs Muscle Mass (lbs): 123 lbs Visceral Fat Rating : 15    HPI  Chief Complaint: OBESITY  Sally [name] is here to discuss her progress with her obesity treatment plan. She is on the keeping a food journal and adhering to recommended goals of 1400 calories and 85 grams of protein and states she is following her eating plan approximately 0 % of the time. She states she is exercising 0 minutes 0 times per week.  Interval History:  Since last office visit she has been sick with Covid infection several weeks ago and now has a sinus infection. She has been unable to focus on her nutrition plan. She reports suboptimal adherence due to being sick over the past month.   She is to undergo a Neurology work up later this month for evaluation for MS. This would be a new diagnosis. She is feeling over whelmed by all of these issues and would like to take a little more time/break with [facility] for about 6 weeks as other concerns are taking priority right now and she feels like she has not been able to give her plan the attention she would like to.   Sleep described as: '[]'$ Restorative '[]'$ Unrestorative '[x]'$ Interrupted due to being sick with Covid and sinus infection . Had to have a steroid injection this week.  Stress levels are reported as high and due to Health.  Barriers identified none and multiple competing priorities.   Pharmacotherapy for weight loss: She is currently taking no anti-obesity medication and Metformin (off label use for incretin effect and / or insulin resistance and / or diabetes prevention)  .     ASSESSMENT AND PLAN  TREATMENT PLAN FOR OBESITY:  Recommended Dietary Goals  [name] is currently in the action stage of change. As such, her goal is to continue weight management plan. She has agreed to: keeping a food journal and adhering to recommended goals of 1400 calories and 85+ grams of  protein if she is ready to resume journaling later this month or next month.  Behavioral Intervention  We discussed the following Behavioral Modification Strategies today: increasing lean protein intake, decreasing simple carbohydrates , increasing vegetables, increasing water intake, and work on meal planning and easy cooking plans.  Additional resources provided today: None  Recommended Physical Activity Goals  Sally [name] has been advised to work up to 150 minutes of moderate intensity aerobic activity a week and strengthening exercises 2-3 times per week for cardiovascular health, weight loss maintenance and preservation of muscle mass.   She has agreed to :  '[x]'$  Continue current level of physical activity  '[]'$  Think about ways to increase physical activity '[]'$  Start strengthening exercises with a goal of 2-3 sessions a week  '[]'$  Start aerobic activity with a goal of 150 minutes a week at moderate intensity.  '[]'$  Increase the intensity, frequency or duration of strengthening exercises  '[]'$  Increase the intensity, frequency or duration of aerobic exercises   '[]'$  Increase physical activity in their day and reduce sedentary time (increase NEAT). '[]'$  Work on scheduling and tracking  physical activity.   Pharmacotherapy We discussed various medication options to help Sally [name] with her weight loss efforts and we both agreed to : continue with nutritional and behavioral strategies  ASSOCIATED CONDITIONS ADDRESSED TODAY  Insulin resistance -     metFORMIN HCl; Take 1 tablet (500 mg total) by mouth 2 (two) times daily with a meal.  Dispense: 120 tablet; Refill: 0  Vitamin D deficiency -     Vitamin D  (Ergocalciferol); TAKE ONE CAPSULE EVERY 7 DAYS  Dispense: 8 capsule; Refill: 0  Emotional Eating Behavior -     Topiramate; Take 1 tablet (50 mg total) by mouth daily.  Dispense: 60 tablet; Refill: 0  Obesity: Starting BMI 41.5   Insulin Resistance Last Insulin 4.2/ Goal is HgbA1c < 5.7. She reports - denies polyphagia. Medication(s):  Metformin 500 mg twice daily with meals No side effects from metformin.  Lab Results  Component Value Date   HGBA1C 5.5 05/06/2021   HGBA1C 5.3 08/07/2020   HGBA1C 5.3 04/03/2015   Lab Results  Component Value Date   INSULIN 4.2 05/06/2021   INSULIN 4.9 08/07/2020    Plan Continue metformin. Continue working on nutrition plan to decrease simple carbohydrates, increase lean proteins and exercise to promote weight loss, improve glycemic control and prevent progression to Type 2 diabetes.   Continue and refill Metformin 500 mg twice daily with meals for 2 months.   Vitamin D Deficiency Vitamin D is not at goal of 50.  Most recent vitamin D level was 45.4. She is on  prescription ergocalciferol 50,000 IU weekly. Lab Results  Component Value Date   VD25OH 45.4 05/06/2021   VD25OH 34 01/08/2016   VD25OH 21 (L) 10/19/2015    Plan: Refill prescription vitamin D 50,000 IU weekly. X 2 months.   Other depression/emotional eating Sally [name] has had issues with stress/emotional eating. Currently this is moderately controlled. Overall mood is stable. Medication(s): Topiramate 50 mg daily.   Plan: Continue and refill Topiramate 50 mg daily  x 60 days.    PHYSICAL EXAM:  Blood pressure (!) 146/83, pulse 79, temperature 98.2 F (36.8 C), height '5\' 8"'$  (1.727 m), weight 270 lb (122.5 kg), last menstrual period 10/26/2015, SpO2 98 %. Body mass index is 41.05 kg/m.  General: She is overweight, cooperative, alert, well developed, and in no acute distress. PSYCH: Has normal mood, affect and thought process.   HEENT: EOMI, sclerae are  anicteric. Lungs: Normal breathing effort, no conversational dyspnea. Extremities: No edema.  Neurologic: No gross sensory or motor deficits. No tremors or fasciculations noted.    DIAGNOSTIC DATA REVIEWED:  BMET    Component Value Date/Time   NA 141 09/08/2021 1853   NA 141 05/06/2021 1015   K 4.1 09/08/2021 1853   CL 107 09/08/2021 1853   CO2 27 09/08/2021 1853   GLUCOSE 89 09/08/2021 1853   BUN 13 09/08/2021 1853   BUN 13 05/06/2021 1015   CREATININE 0.73 09/08/2021 1853   CREATININE 0.77 04/03/2015 1113   CALCIUM 9.0 09/08/2021 1853   GFRNONAA >60 09/08/2021 1853   GFRAA >60 10/20/2018 0846   Lab Results  Component Value Date   HGBA1C 5.5 05/06/2021   HGBA1C 5.3 04/03/2015   Lab Results  Component Value Date   INSULIN 4.2 05/06/2021   INSULIN 4.9 08/07/2020   Lab Results  Component Value Date   TSH 0.010 (L) 05/06/2021   CBC    Component Value Date/Time   WBC 8.1 09/08/2021 1853   RBC  4.57 09/08/2021 1853   HGB 14.2 09/08/2021 1853   HGB 15.9 09/23/2013 1341   HCT 43.6 09/08/2021 1853   PLT 256 09/08/2021 1853   MCV 95.4 09/08/2021 1853   MCV 93.3 11/03/2014 0912   MCH 31.1 09/08/2021 1853   MCHC 32.6 09/08/2021 1853   RDW 13.7 09/08/2021 1853   Iron Studies    Component Value Date/Time   FERRITIN 67 07/27/2014 1634   Lipid Panel     Component Value Date/Time   CHOL 151 05/06/2021 1015   TRIG 75 05/06/2021 1015   HDL 44 05/06/2021 1015   CHOLHDL 5 09/03/2016 0942   VLDL 18.8 09/03/2016 0942   LDLCALC 92 05/06/2021 1015   Hepatic Function Panel     Component Value Date/Time   PROT 6.4 05/06/2021 1015   ALBUMIN 3.9 05/06/2021 1015   AST 17 05/06/2021 1015   ALT 17 05/06/2021 1015   ALKPHOS 92 05/06/2021 1015   BILITOT 0.2 05/06/2021 1015   BILIDIR 0.1 09/03/2016 0942      Component Value Date/Time   TSH 0.010 (L) 05/06/2021 1015   Nutritional Lab Results  Component Value Date   VD25OH 45.4 05/06/2021   VD25OH 34 01/08/2016    VD25OH 21 (L) 10/19/2015     Return in about 6 weeks (around 05/07/2022) for Fasting Lab.Marland Kitchen She was informed of the importance of frequent follow up visits to maximize her success with intensive lifestyle modifications for her multiple health conditions.   ATTESTASTION STATEMENTS:  Reviewed by clinician on day of visit: allergies, medications, problem list, medical history, surgical history, family history, social history, and previous encounter notes.   Time spent on visit including pre-visit chart review and post-visit care and charting was 31 minutes.    Cheyenne Schumm,PA-C

## 2022-04-08 ENCOUNTER — Encounter: Payer: No Typology Code available for payment source | Admitting: Physical Medicine and Rehabilitation

## 2022-04-09 ENCOUNTER — Encounter: Payer: Self-pay | Admitting: Diagnostic Neuroimaging

## 2022-04-09 ENCOUNTER — Ambulatory Visit (INDEPENDENT_AMBULATORY_CARE_PROVIDER_SITE_OTHER): Payer: Medicare Other | Admitting: Diagnostic Neuroimaging

## 2022-04-09 VITALS — BP 142/89 | HR 84 | Ht 68.0 in | Wt 276.6 lb

## 2022-04-09 DIAGNOSIS — R531 Weakness: Secondary | ICD-10-CM | POA: Diagnosis not present

## 2022-04-09 DIAGNOSIS — R413 Other amnesia: Secondary | ICD-10-CM | POA: Diagnosis not present

## 2022-04-09 NOTE — Patient Instructions (Signed)
  MEMORY LOSS / CHRONIC PAIN SYNDROME (likely related to --> chronic pain syndrome / fibromyalgia, CRPS, depression, fatigue)  - no evidence of multiple sclerosis or other primary neurologic etiology; normal MRI brain and cervical spinal cord  - follow up hypothyroidism and treatment  - continue psychology and pain mgmt treatments  - consider OSA evaluation
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
## 2022-04-29 ENCOUNTER — Encounter: Payer: No Typology Code available for payment source | Attending: Psychology | Admitting: Psychology

## 2022-04-29 DIAGNOSIS — G894 Chronic pain syndrome: Secondary | ICD-10-CM | POA: Diagnosis not present

## 2022-04-29 DIAGNOSIS — F419 Anxiety disorder, unspecified: Secondary | ICD-10-CM | POA: Insufficient documentation

## 2022-04-29 DIAGNOSIS — G90513 Complex regional pain syndrome I of upper limb, bilateral: Secondary | ICD-10-CM | POA: Insufficient documentation

## 2022-04-29 DIAGNOSIS — R531 Weakness: Secondary | ICD-10-CM | POA: Diagnosis not present

## 2022-04-29 DIAGNOSIS — R351 Nocturia: Secondary | ICD-10-CM | POA: Diagnosis not present

## 2022-04-29 DIAGNOSIS — F32A Depression, unspecified: Secondary | ICD-10-CM | POA: Insufficient documentation

## 2022-04-29 DIAGNOSIS — M797 Fibromyalgia: Secondary | ICD-10-CM | POA: Diagnosis present

## 2022-04-29 NOTE — Progress Notes (Signed)
Patient:  Sally [name]   DOB: 06/05/1975  MR Number: [id number]  Location: [facility] [address] [id number] [place] [id number] Dept: [phone]  Start: 8 AM End: 9 AM  .  Today's visit was an in person visit conducted in outpatient clinic office with the patient myself present.   Provider/Observer:     Hershal CoriaJohn R Amadi Yoshino PsyD  Chief Complaint:      Chief Complaint  Patient presents with   Anxiety   Depression   Pain   Memory Loss   Other    Reason For Service:     Blima DessertDiana Marcin is a 47 year old female [facility] for therapeutic interventions due to chronic pain symptoms.  The patient reports that she has great difficulty standing, walking for long times, elbow pain, migraines, hip pain, shoulder and neck pain.  The patient fell at work and fractured both her elbows and also had knee replacement surgery.  The patient has been taking maintenance doses of opioid pain medications for some time.  The patient reports that she has a great deal of difficulty functioning and has very poor sleep and wakes up numerous times each night.  The patient also has dealt with depression and anxiety for many years and has been hospitalized for depression anxiety years ago.  She has been receiving counseling for anxiety and depression and takes Trintellix.   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   The patient describes chronic issues of depression anxiety but the development of severe chronic pain.  The patient reports that she is continuing to  have some pain in her back post surgery and had a recent fall.  Follow-up MRI did not suggest any structural damage or injury from this fall.  The patient reports that her symptoms associated with complex regional pain syndrome have worsened recently.  11/01/2019: The patient is continued to have significant pain and distress with worsening episodes of her complex regional pain syndrome and fibromyalgia type symptoms.  The patient has been very frustrated at times with her lack of ability to function and do daily activities.  01/03/2020: The patient reports that she is continued to have significant pain but has particularly had worsening of her fatigue and somnolence.  She reports that she feels like she is sleeping an adequate amount of time but is very tired and fatigued throughout the day.  02/28/2020: The patient reports that she has had some worsening in her significant pain symptoms with cold and atmospheric changes.  She continues to have significant issues with her upper extremities related to complex regional pain syndrome and fibromyalgia.  The patient also reports that she has had significant psychosocial stressors with her closest friend having terminal cancer and being given 3 months to live.  The patient and her friend are talking about the patient taking over care of the patient's granddaughter and the patient also feels a great deal of need to do what ever she can for her friend going forward.  This is a very difficult and stressful situation for the patient on top of her numerous  medical and pain issues.  The patient reports that her sleep continues to be improved but limited with pain disrupting sleep patterns.  The patient has been followed by Dr. Myna Hidalgo MD [facility]) for some time for pain management and she is now been referred for an evaluation for spinal cord stimulator trialing and possible implantation as well as being scheduled for ketamine infusions.  They are still  working on Event organiser. coverage for the ketamine infusions.  I think that the patient is an excellent candidate for both of these possible options.  The patient and I talked about the appropriateness for ketamine infusions due to her wide spectrum of issues for some time now and I am quite pleased to find out that her treating pain specialist's clinic is now doing ketamine infusions that she appears to be an excellent candidate.  She also appears to be an excellent candidate for spinal cord stimulator trialing and possible implantation.  03/27/2020: The patient reports that she has gone through all of the preliminary work-up to have a spinal cord stimulator trial conducted and is looking forward with hope that it will help with her significant cervical related pain.  The patient is also has an initial appointment to assess for possible ketamine infusion therapies as well.  We reviewed issues related to both the spinal cord stimulator and ketamine infusion with the patient as well as worked on therapeutic interventions around her chronic pain/complex regional pain syndrome diagnoses.  The patient reports that she still has significant psychosocial stressors with her closest friend dying from breast cancer and in the last month or 2 of her life.  At this point, the patient does not look like she will have to take over her friend's granddaughters care as the family is hoping that an uncle of the young girl will take over care.  04/24/2020: The patient returns reporting that she has gotten her spinal cord stimulator trialing device and has it implanted currently.  However, there have been mechanical problems with the device and it has been shutting off independently and the patient has not been able to get a good reading on how much it has helped her.  The patient reports that she did experience some brief improvement when she knew that it was on but it would turn off.  She is gone back to the device  maker and they could not figure out the problem.  They are going to put a new external device in and extend the spinal cord stimulator trial in face for 2 days.  The patient has also had her first ketamine infusion trial.  She reports that she was told she was hallucinating and dissociative during this phase and experienced some improvement over the rest of the day as far as her mood and pain symptoms but it was over within 24 hours.  The patient is scheduled to have her next ketamine infusion coming up.  08/07/2020: The patient reports that they are looking at doing a new spinal cord stimulator trial as the device did not work properly during her 7-day trial.  In fact, they attempted to extended to 10 days hoping to get it working correctly.  However, the patient reports that she felt like it did help the first day when it was apparently working but shortly stopped working and it sounds like there were never able to get it working properly.  The patient reports that at this point, her anesthesiologist/pain specialist does not think that  she should have any more ketamine trials but the patient would like to try them again.  The patient reports that the doctor felt that she had an adverse reaction to it but her husband was present and did not describe any severe reaction.  At this point, she is planning to talk to him again about possibly having another ketamine infusion if possible.  The patient reports that she continues with her significant chronic pain symptoms but she is managing day-to-day.  She reports that there continues to be a major stressor in her life relative to a very close friend who has terminal cancer and the cancer is continuing to progress.  09/11/2020: The patient reports that her friend is now being managed by hospice and appears to be at the end of her life.  This has been stressful but the patient has been able to adjust to the impending reality with time and she feels like she is handling  it fairly well.  The patient reports that she continues to have significant pain in her arms radiating from her neck.  The patient reports that at this point she has not been able to return for ketamine therapies and not discussed strategies to follow-up with [facility].  11/06/2020: The patient has continued to have a lot of stress with depression and anxiety worsening particular around the impending death of a very close friend.  The friend is dying from metastatic cancer and the patient is one of the primary helpers for her friend.  The patient is very close to this friend.  The patient interacts with hospice and is spending a lot of time helping.  This is been coinciding with a worsening of her cervical pain radiating down her right arm primarily.  She continues to struggle with severe pain and the depression and anxiety associated around all of these issues has been worse.  12/11/2020: The patient reports that she has had a very stressful time recently.  Her close friend has passed away and while her friend passed away peacefully and was in the care of hospice at the end of her life there is [employer] "shit show" happening around her friend's death.  The patient reports that the friends son who has been very active in the friend's life tragically died just a couple of days after the friend's death in a motor vehicle accident.  The son who had had little interaction with her friend did show up and there was conflict between the friend's new husband and the son regarding ownership of some of the property.  The friend had completed an extensive well that gave all of her estate to the grandchildren but there was still a great deal of arguing.  The husband had lifetime rights to the house for now.  The patient reports that the death of her friend's son happened before the funeral and so they ended up having a joint funeral for the son and the patient's friend.  The patient reports that her depression  has been very severe and the fact that she is the executor for her friend's estate has had a great deal of stress.  Her pain continues to be very severe.  05/02/2021:  Patient continues to struggle with pain and has not returned back to [facility] yet for any interventions.  They are still working on plan.  Patient still with stress over friends death and the fall out over friends grandchild etc.  Still a very complicated situation.  08/05/2021: The  patient reports that she has had a lot on her plate since I saw her last in April.  The patient reports that she now has her friend's great-granddaughter living permanently with her and is becoming part of her family.  This young lady does have a lot of behavioral issues and it is a challenge for the patient to manage some of the issues.  However, she feels a reward for being able to provide a stable home and follow the wishes of her friend after her friend passed away from cancer.  10-28-21: The patient reports that she has continued to struggle with her chronic pain symptoms but reports that things have been stable as far as overall psychosocial issues and things are going well with her adopted daughter.  The patient reports that there have been ongoing issues with depression and anxiety.  Today we worked on coping and adjustment issues going forward and strategies to manage the symptoms.  12/26/2021: The patient returns today after 57-month break.  The patient continues to struggle with chronic pain symptoms.  The patient has not returned to see pain management group recently and [facility].  The patient reports that this has helped some but she continues with her significant pain.  The patient has been approved for ketamine therapies through [facility] but after 1 trial they felt she was not a good candidate although the patient wanted to continue with these efforts.  I have referred the patient to Dr. Carlis Abbott here in our office  for review about the appropriateness of sublingual ketamine treatment as the patient has verbalized willingness to continue with therapy and this strategy but a different type of very small doses delivered sublingually.  The patient is hoping that this will also help with her depression and other symptoms that go along with her pain.  I will sit down with Dr. Carlis Abbott and reviewed this prior to the patient's visit that is scheduled on 01/14/2022.  04/29/2022: The patient returns and reports that she has really been struggling with chronic pain lately and has a particularly bad week so far with regard to pain and fatigue.  Patient saw neurology regarding her memory difficulties and impressions from that were consistent with my impression is that her memory difficulties are likely resulting from her chronic pain, depression and anxiety stress and other functional issues rather than a degenerative neurological condition.  She was assessed for possible multiple sclerosis and thus far all examinations around that possibility including ANA test and MRI were not indicative of MS.  Patient continues to have a lot of stress and is exacerbated by her complex regional pain symptoms etc.  Patient reports that she has not started back on the small sublingual ketamine doses but could not remember exactly why this was stopped.  She thinks it was around the time when she had significant illness with COVID.  The patient reports that Dr. Carlis Abbott had wanted her to try a different medicine but does not remember what that medicine was.  I will address this issue with Dr. Carlis Abbott.  Interventions Strategy:  Cognitive/behavioral therapeutic interventions along with coping skills and strategies around chronic pain, sleep disturbance and depression/anxiety.  Participation Level:   Active  Participation Quality:  Appropriate and Attentive      Behavioral Observation:  Well Groomed, Alert, and Appropriate.   Current Psychosocial  Factors: Patient is still looking at having friend's grand daughter come live with patient and will be starting soon.   Content  of Session:   Reviewed current symptoms and changes she is experienced since her cervical surgery and pain she experiences radiating down her arms.  We also reviewed strategies for restarting ketamine therapies but with a different strategy including sublingual ketamine.   Current Status:   The patient is hoping that they are able to do another spinal cord stimulator trial as she felt like it was helpful the first day before the device stopped working.  Patient Progress:   The patient reports that her overall sleep has improved from initial status but continues to be problematic.  The patient reports that her vein plays a big role in her difficulty sleeping.  Depression anxiety continue to be quite problematic for the patient.  Impression/Diagnosis:   Domonique Nakahara is a 47 year old female referred by [facility] for therapeutic interventions due to chronic pain symptoms.  The patient reports that she has great difficulty standing, walking for long times, elbow pain, migraines, hip pain, shoulder and neck pain.  The patient fell at work and fractured both her elbows and also had knee replacement surgery.  The patient has been taking maintenance doses of opioid pain medications for some time.  The patient reports that she has a great deal of difficulty functioning and has very poor sleep and wakes up numerous times each night.  The patient also has dealt with depression and anxiety for many years and has been hospitalized for depression anxiety years ago.  She has been receiving counseling for anxiety and depression and takes Trintellix.   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   The patient describes chronic issues of depression anxiety but the development of severe chronic pain  After a fall at work in 2015 where she fractured both of her elbows, injured her knee and developed fibromyalgia/regional pain syndrome.  The patient reports that she has ongoing difficulties walking distances or standing.  She describes elbow pain, trouble lifting things and trouble writing for any period of time.  The patient describes significant sleep disturbance and is unable to fall asleep at night.  She reports that she is always tired.  The patient describes a normal appetite.  The patient describes memory issues related to trouble remembering things and concentration issues.  The patient reports that she is significantly less active with her children and husband and that her husband has to help more due to her significant pain.  We have continue to work on issues related to her pain and sleep status along with issues of depression and anxiety.  02/28/2020: The patient reports that she has had some worsening in her significant pain symptoms with cold and atmospheric changes.  She continues to have significant issues with her upper extremities related to complex regional pain syndrome and fibromyalgia.  The patient also reports that she has had significant psychosocial stressors with her closest friend having terminal cancer and being given 3 months to live.  The patient and her friend are talking about the patient taking over care of the patient's granddaughter and the patient also feels a great deal of need to do what ever she can for her friend going forward.  This is a very  difficult and stressful situation for the patient on top of her numerous medical and pain issues.  The patient reports that her sleep continues to be improved but limited with pain disrupting sleep patterns.   The patient has been followed by Dr. Myna Hidalgo MD [facility]) for some time for pain management and she is now been referred for an evaluation for spinal cord stimulator trialing and possible implantation as well as being scheduled for ketamine infusions.  They are still working on Event organiser. coverage for the ketamine infusions.  I think that the patient is an excellent candidate for both of these possible options.  The patient and I talked about the appropriateness for ketamine infusions due to her wide spectrum of issues for some time now and I am quite pleased to find out that her treating pain specialist's clinic is now doing ketamine infusions that she appears to be an excellent candidate.  She also appears to be an excellent candidate for spinal cord stimulator trialing and possible implantation.  04/24/2020: The patient returns reporting that she has gotten her spinal cord stimulator trialing device and has it implanted currently.  However, there have been mechanical problems with the device and it has been shutting off independently and the patient has not been able to get a good reading on how much it has helped her.  The patient reports that she did experience some brief improvement when she knew that it was on but it would turn off.  She is gone back to the device maker and they could not figure out the problem.  They are going to put a new external device in and extend the spinal cord stimulator trial in face for 2 days.  The patient has also had her first ketamine infusion trial.  She reports that she was told she was hallucinating and dissociative during this phase and experienced some improvement over the rest of the day as far as her mood and pain symptoms but it was over within 24 hours.  The patient is scheduled to have her next ketamine infusion coming up.  08/07/2020: The patient reports that they are looking at doing a new spinal cord stimulator trial as the device  did not work properly during her 7-day trial.  In fact, they attempted to extended to 10 days hoping to get it working correctly.  However, the patient reports that she felt like it did help the first day when it was apparently working but shortly stopped working and it sounds like there were never able to get it working properly.  The patient reports that at this point, her anesthesiologist/pain specialist does not think that she should have any more ketamine trials but the patient would like to try them again.  The patient reports that the doctor felt that she had an adverse reaction to it but her husband was present and did not describe any severe reaction.  At this point, she is planning to talk to him again about possibly having another ketamine infusion if possible.  The patient reports that she continues with her significant chronic pain symptoms but she is managing day-to-day.  She reports that there continues to be a major stressor in her life relative to a very close friend who has terminal cancer and the cancer is continuing to progress.  09/11/2020: The patient reports that her friend is now being managed by hospice and appears to be at the end of her life.  This has been stressful  but the patient has been able to adjust to the impending reality with time and she feels like she is handling it fairly well.  The patient reports that she continues to have significant pain in her arms radiating from her neck.  The patient reports that at this point she has not been able to return for ketamine therapies and not discussed strategies to follow-up with [facility].  11/06/2020: The patient has continued to have a lot of stress with depression and anxiety worsening particular around the impending death of a very close friend.  The friend is dying from metastatic cancer and the patient is one of the primary helpers for her friend.  The patient is very close to this friend.  The patient interacts  with hospice and is spending a lot of time helping.  This is been coinciding with a worsening of her cervical pain radiating down her right arm primarily.  She continues to struggle with severe pain and the depression and anxiety associated around all of these issues has been worse.  12/11/2020: The patient reports that she has had a very stressful time recently.  Her close friend has passed away and while her friend passed away peacefully and was in the care of hospice at the end of her life there is [employer] "shit show" happening around her friend's death.  The patient reports that the friends son who has been very active in the friend's life tragically died just a couple of days after the friend's death in a motor vehicle accident.  The son who had had little interaction with her friend did show up and there was conflict between the friend's new husband and the son regarding ownership of some of the property.  The friend had completed an extensive well that gave all of her estate to the grandchildren but there was still a great deal of arguing.  The husband had lifetime rights to the house for now.  The patient reports that the death of her friend's son happened before the funeral and so they ended up having a joint funeral for the son and the patient's friend.  The patient reports that her depression has been very severe and the fact that she is the executor for her friend's estate has had a great deal of stress.  Her pain continues to be very severe.  Today we continue to work on therapeutic interventions for coping and adjustment in dealing with significant depression and recent exacerbations and stress.  05/02/2021:  Patient continues to struggle with pain and has not returned back to [facility] yet for any interventions.  They are still working on plan.  Patient still with stress over friends death and the fall out over friends grandchild etc.  Still a very complicated situation  10/22/2021: The  patient reports that she has continued to struggle with her chronic pain symptoms but reports that things have been stable as far as overall psychosocial issues and things are going well with her adopted daughter.  The patient reports that there have been ongoing issues with depression and anxiety.  Today we worked on coping and adjustment issues going forward and strategies to manage the symptoms.  12/26/2021: The patient returns today after 75-month break.  The patient continues to struggle with chronic pain symptoms.  The patient has not returned to see pain management group recently and [facility].  The patient reports that this has helped some but she continues with her significant pain.  The patient has  been approved for ketamine therapies through [facility] but after 1 trial they felt she was not a good candidate although the patient wanted to continue with these efforts.  I have referred the patient to Dr. Carlis Abbott here in our office for review about the appropriateness of sublingual ketamine treatment as the patient has verbalized willingness to continue with therapy and this strategy but a different type of very small doses delivered sublingually.  The patient is hoping that this will also help with her depression and other symptoms that go along with her pain.  I will sit down with Dr. Carlis Abbott and reviewed this prior to the patient's visit that is scheduled on 01/14/2022.  04/29/2022: The patient returns and reports that she has really been struggling with chronic pain lately and has a particularly bad week so far with regard to pain and fatigue.  Patient saw neurology regarding her memory difficulties and impressions from that were consistent with my impression is that her memory difficulties are likely resulting from her chronic pain, depression and anxiety stress and other functional issues rather than a degenerative neurological condition.  She was assessed for possible  multiple sclerosis and thus far all examinations around that possibility including ANA test and MRI were not indicative of MS.  Patient continues to have a lot of stress and is exacerbated by her complex regional pain symptoms etc.  Patient reports that she has not started back on the small sublingual ketamine doses but could not remember exactly why this was stopped.  She thinks it was around the time when she had significant illness with COVID.  The patient reports that Dr. Carlis Abbott had wanted her to try a different medicine but does not remember what that medicine was.  I will address this issue with Dr. Carlis Abbott.  Diagnosis: Complex regional pain syndrome, fibromyalgia, anxiety and depression, neck, shoulder and back pain.

## 2022-05-07 ENCOUNTER — Ambulatory Visit (INDEPENDENT_AMBULATORY_CARE_PROVIDER_SITE_OTHER): Payer: Medicare Other | Admitting: Family Medicine

## 2022-05-13 ENCOUNTER — Ambulatory Visit: Payer: Medicare Other | Admitting: Diagnostic Neuroimaging

## 2022-05-16 ENCOUNTER — Encounter (HOSPITAL_BASED_OUTPATIENT_CLINIC_OR_DEPARTMENT_OTHER): Payer: No Typology Code available for payment source | Admitting: Physical Medicine and Rehabilitation

## 2022-05-16 ENCOUNTER — Encounter: Payer: Self-pay | Admitting: Physical Medicine and Rehabilitation

## 2022-05-16 VITALS — BP 146/86 | HR 80 | Ht 68.0 in | Wt 275.0 lb

## 2022-05-16 DIAGNOSIS — G90513 Complex regional pain syndrome I of upper limb, bilateral: Secondary | ICD-10-CM | POA: Diagnosis not present

## 2022-05-16 DIAGNOSIS — F419 Anxiety disorder, unspecified: Secondary | ICD-10-CM | POA: Diagnosis not present

## 2022-05-16 DIAGNOSIS — M797 Fibromyalgia: Secondary | ICD-10-CM
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
## 2022-05-16 NOTE — Patient Instructions (Signed)
Turmeric to reduce inflammation--can be used in cooking or taken as a supplement.  Benefits of turmeric:  -Highly anti-inflammatory  -Increases antioxidants  -Improves memory, attention, brain disease  -Lowers risk of heart disease  -May help prevent cancer  -Decreases pain  -Alleviates depression  -Delays aging and decreases risk of chronic disease  -Consume with black pepper to increase absorption    Turmeric Milk Recipe:  1 cup milk  1 tsp turmeric  1 tsp cinnamon  1 tsp grated ginger (optional)  Black pepper (boosts the anti-inflammatory properties of turmeric).  1 tsp honey  

## 2022-05-27 ENCOUNTER — Encounter: Payer: No Typology Code available for payment source | Attending: Psychology | Admitting: Psychology

## 2022-05-27 DIAGNOSIS — F32A Depression, unspecified: Secondary | ICD-10-CM | POA: Insufficient documentation

## 2022-05-27 DIAGNOSIS — F419 Anxiety disorder, unspecified: Secondary | ICD-10-CM | POA: Diagnosis present

## 2022-05-27 DIAGNOSIS — R4189 Other symptoms and signs involving cognitive functions and awareness: Secondary | ICD-10-CM | POA: Insufficient documentation

## 2022-05-27 DIAGNOSIS — G90513 Complex regional pain syndrome I of upper limb, bilateral: Secondary | ICD-10-CM | POA: Diagnosis not present

## 2022-05-27 DIAGNOSIS — R351 Nocturia: Secondary | ICD-10-CM | POA: Diagnosis not present

## 2022-05-27 DIAGNOSIS — G894 Chronic pain syndrome: Secondary | ICD-10-CM | POA: Insufficient documentation

## 2022-06-18 NOTE — Progress Notes (Signed)
Patient:  Sally [name]   DOB: [birth year]  MR Number: [id number]  Location: [facility] [address] [id number] [facility] [place] [address] Dept: [phone]  Start: 9 AM End: 10 AM  .  Today's visit was an in person visit conducted in outpatient clinic office with the patient myself present.   Provider/Observer:     Hershal Coria PsyD  Chief Complaint:      Chief Complaint  Patient presents with   Anxiety   Depression   Pain   Memory Loss   Other    Reason For Service:     Sally [name] is a 47 year old female referred by [facility] medical for therapeutic interventions due to chronic pain symptoms.  The patient reports that she has great difficulty standing, walking for long times, elbow pain, migraines, hip pain, shoulder and neck pain.  The patient fell at work and fractured both her elbows and also had knee replacement surgery.  The patient has been taking maintenance doses of opioid pain medications for some time.  The patient reports that she has a great deal of difficulty functioning and has very poor sleep and wakes up numerous times each night.  The patient also has dealt with depression and anxiety for many years and has been hospitalized for depression anxiety years ago.  She has been receiving counseling for anxiety and depression and takes Trintellix.   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   The patient describes chronic issues of depression anxiety but the development of severe chronic pain.  The patient reports that she is continuing to  have some pain in her back post surgery and had a recent fall.  Follow-up MRI did not suggest any structural damage or injury from this fall.  The patient reports that her symptoms associated with complex regional pain syndrome have worsened recently.  11/01/2019: The patient is continued to have significant pain and distress with worsening episodes of her complex regional pain syndrome and fibromyalgia type symptoms.  The patient has been very frustrated at times with her lack of ability to function and do daily activities.  01/03/2020: The patient reports that she is continued to have significant pain but has particularly had worsening of her fatigue and somnolence.  She reports that she feels like she is sleeping an adequate amount of time but is very tired and fatigued throughout the day.  02/28/2020: The patient reports that she has had some worsening in her significant pain symptoms with cold and atmospheric changes.  She continues to have significant issues with her upper extremities related to complex regional pain syndrome and fibromyalgia.  The patient also reports that she has had significant psychosocial stressors with her closest friend having terminal cancer and being given 3 months to live.  The patient and her friend are talking about the patient taking over care of the patient's granddaughter and the patient also feels a great deal of need to do what ever she can for her friend going forward.  This is a very difficult and stressful situation for the patient on top of her numerous  medical and pain issues.  The patient reports that her sleep continues to be improved but limited with pain disrupting sleep patterns.  The patient has been followed by Dr. Myna Hidalgo MD [facility]) for some time for pain management and she is now been referred for an evaluation for spinal cord stimulator trialing and possible implantation as well as being scheduled for ketamine infusions.  They are still  working on Event organiser. coverage for the ketamine infusions.  I think that the patient is an excellent candidate for both of these possible options.  The patient and I talked about the appropriateness for ketamine infusions due to her wide spectrum of issues for some time now and I am quite pleased to find out that her treating pain specialist's clinic is now doing ketamine infusions that she appears to be an excellent candidate.  She also appears to be an excellent candidate for spinal cord stimulator trialing and possible implantation.  03/27/2020: The patient reports that she has gone through all of the preliminary work-up to have a spinal cord stimulator trial conducted and is looking forward with hope that it will help with her significant cervical related pain.  The patient is also has an initial appointment to assess for possible ketamine infusion therapies as well.  We reviewed issues related to both the spinal cord stimulator and ketamine infusion with the patient as well as worked on therapeutic interventions around her chronic pain/complex regional pain syndrome diagnoses.  The patient reports that she still has significant psychosocial stressors with her closest friend dying from breast cancer and in the last month or 2 of her life.  At this point, the patient does not look like she will have to take over her friend's granddaughters care as the family is hoping that an uncle of the young girl will take over care.  04/24/2020: The patient returns reporting that she has gotten her spinal cord stimulator trialing device and has it implanted currently.  However, there have been mechanical problems with the device and it has been shutting off independently and the patient has not been able to get a good reading on how much it has helped her.  The patient reports that she did experience some brief improvement when she knew that it was on but it would turn off.  She is gone back to the device  maker and they could not figure out the problem.  They are going to put a new external device in and extend the spinal cord stimulator trial in face for 2 days.  The patient has also had her first ketamine infusion trial.  She reports that she was told she was hallucinating and dissociative during this phase and experienced some improvement over the rest of the day as far as her mood and pain symptoms but it was over within 24 hours.  The patient is scheduled to have her next ketamine infusion coming up.  08/07/2020: The patient reports that they are looking at doing a new spinal cord stimulator trial as the device did not work properly during her 7-day trial.  In fact, they attempted to extended to 10 days hoping to get it working correctly.  However, the patient reports that she felt like it did help the first day when it was apparently working but shortly stopped working and it sounds like there were never able to get it working properly.  The patient reports that at this point, her anesthesiologist/pain specialist does not think that  she should have any more ketamine trials but the patient would like to try them again.  The patient reports that the doctor felt that she had an adverse reaction to it but her husband was present and did not describe any severe reaction.  At this point, she is planning to talk to him again about possibly having another ketamine infusion if possible.  The patient reports that she continues with her significant chronic pain symptoms but she is managing day-to-day.  She reports that there continues to be a major stressor in her life relative to a very close friend who has terminal cancer and the cancer is continuing to progress.  09/11/2020: The patient reports that her friend is now being managed by hospice and appears to be at the end of her life.  This has been stressful but the patient has been able to adjust to the impending reality with time and she feels like she is handling  it fairly well.  The patient reports that she continues to have significant pain in her arms radiating from her neck.  The patient reports that at this point she has not been able to return for ketamine therapies and not discussed strategies to follow-up with [facility].  11/06/2020: The patient has continued to have a lot of stress with depression and anxiety worsening particular around the impending death of a very close friend.  The friend is dying from metastatic cancer and the patient is one of the primary helpers for her friend.  The patient is very close to this friend.  The patient interacts with hospice and is spending a lot of time helping.  This is been coinciding with a worsening of her cervical pain radiating down her right arm primarily.  She continues to struggle with severe pain and the depression and anxiety associated around all of these issues has been worse.  12/11/2020: The patient reports that she has had a very stressful time recently.  Her close friend has passed away and while her friend passed away peacefully and was in the care of hospice at the end of her life there is [employer] "shit show" happening around her friend's death.  The patient reports that the friends son who has been very active in the friend's life tragically died just a couple of days after the friend's death in a motor vehicle accident.  The son who had had little interaction with her friend did show up and there was conflict between the friend's new husband and the son regarding ownership of some of the property.  The friend had completed an extensive well that gave all of her estate to the grandchildren but there was still a great deal of arguing.  The husband had lifetime rights to the house for now.  The patient reports that the death of her friend's son happened before the funeral and so they ended up having a joint funeral for the son and the patient's friend.  The patient reports that her depression  has been very severe and the fact that she is the executor for her friend's estate has had a great deal of stress.  Her pain continues to be very severe.  05/02/2021:  Patient continues to struggle with pain and has not returned back to [facility] yet for any interventions.  They are still working on plan.  Patient still with stress over friends death and the fall out over friends grandchild etc.  Still a very complicated situation.  08/05/2021: The  patient reports that she has had a lot on her plate since I saw her last in April.  The patient reports that she now has her friend's great-granddaughter living permanently with her and is becoming part of her family.  This young lady does have a lot of behavioral issues and it is a challenge for the patient to manage some of the issues.  However, she feels a reward for being able to provide a stable home and follow the wishes of her friend after her friend passed away from cancer.  October 29, 2021: The patient reports that she has continued to struggle with her chronic pain symptoms but reports that things have been stable as far as overall psychosocial issues and things are going well with her adopted daughter.  The patient reports that there have been ongoing issues with depression and anxiety.  Today we worked on coping and adjustment issues going forward and strategies to manage the symptoms.  12/26/2021: The patient returns today after 43-month break.  The patient continues to struggle with chronic pain symptoms.  The patient has not returned to see pain management group recently and [facility].  The patient reports that this has helped some but she continues with her significant pain.  The patient has been approved for ketamine therapies through [facility] but after 1 trial they felt she was not a good candidate although the patient wanted to continue with these efforts.  I have referred the patient to Dr. Carlis Abbott here in our office  for review about the appropriateness of sublingual ketamine treatment as the patient has verbalized willingness to continue with therapy and this strategy but a different type of very small doses delivered sublingually.  The patient is hoping that this will also help with her depression and other symptoms that go along with her pain.  I will sit down with Dr. Carlis Abbott and reviewed this prior to the patient's visit that is scheduled on 01/14/2022.  04/29/2022: The patient returns and reports that she has really been struggling with chronic pain lately and has a particularly bad week so far with regard to pain and fatigue.  Patient saw neurology regarding her memory difficulties and impressions from that were consistent with my impression is that her memory difficulties are likely resulting from her chronic pain, depression and anxiety stress and other functional issues rather than a degenerative neurological condition.  She was assessed for possible multiple sclerosis and thus far all examinations around that possibility including ANA test and MRI were not indicative of MS.  Patient continues to have a lot of stress and is exacerbated by her complex regional pain symptoms etc.  Patient reports that she has not started back on the small sublingual ketamine doses but could not remember exactly why this was stopped.  She thinks it was around the time when she had significant illness with COVID.  The patient reports that Dr. Carlis Abbott had wanted her to try a different medicine but does not remember what that medicine was.  I will address this issue with Dr. Carlis Abbott.  Interventions Strategy:  Cognitive/behavioral therapeutic interventions along with coping skills and strategies around chronic pain, sleep disturbance and depression/anxiety.  Participation Level:   Active  Participation Quality:  Appropriate and Attentive      Behavioral Observation:  Well Groomed, Alert, and Appropriate.   Current Psychosocial  Factors: Patient is still looking at having friend's grand daughter come live with patient and will be starting soon.   Content  of Session:   Reviewed current symptoms and changes she is experienced since her cervical surgery and pain she experiences radiating down her arms.  We also reviewed strategies for restarting ketamine therapies but with a different strategy including sublingual ketamine.   Current Status:   The patient is hoping that they are able to do another spinal cord stimulator trial as she felt like it was helpful the first day before the device stopped working.  Patient Progress:   The patient reports that her overall sleep has improved from initial status but continues to be problematic.  The patient reports that her vein plays a big role in her difficulty sleeping.  Depression anxiety continue to be quite problematic for the patient.  Impression/Diagnosis:   Sally [name] is a 47 year old female referred by [facility] medical for therapeutic interventions due to chronic pain symptoms.  The patient reports that she has great difficulty standing, walking for long times, elbow pain, migraines, hip pain, shoulder and neck pain.  The patient fell at work and fractured both her elbows and also had knee replacement surgery.  The patient has been taking maintenance doses of opioid pain medications for some time.  The patient reports that she has a great deal of difficulty functioning and has very poor sleep and wakes up numerous times each night.  The patient also has dealt with depression and anxiety for many years and has been hospitalized for depression anxiety years ago.  She has been receiving counseling for anxiety and depression and takes Trintellix.   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   The patient describes chronic issues of depression anxiety but the development of severe chronic pain  After a fall at work in 2015 where she fractured both of her elbows, injured her knee and developed fibromyalgia/regional pain syndrome.  The patient reports that she has ongoing difficulties walking distances or standing.  She describes elbow pain, trouble lifting things and trouble writing for any period of time.  The patient describes significant sleep disturbance and is unable to fall asleep at night.  She reports that she is always tired.  The patient describes a normal appetite.  The patient describes memory issues related to trouble remembering things and concentration issues.  The patient reports that she is significantly less active with her children and husband and that her husband has to help more due to her significant pain.  We have continue to work on issues related to her pain and sleep status along with issues of depression and anxiety.  02/28/2020: The patient reports that she has had some worsening in her significant pain symptoms with cold and atmospheric changes.  She continues to have significant issues with her upper extremities related to complex regional pain syndrome and fibromyalgia.  The patient also reports that she has had significant psychosocial stressors with her closest friend having terminal cancer and being given 3 months to live.  The patient and her friend are talking about the patient taking over care of the patient's granddaughter and the patient also feels a great deal of need to do what ever she can for her friend going forward.  This is a very  difficult and stressful situation for the patient on top of her numerous medical and pain issues.  The patient reports that her sleep continues to be improved but limited with pain disrupting sleep patterns.   The patient has been followed by Dr. Myna Hidalgo MD [facility]) for some time for pain management and she is now been referred for an evaluation for spinal cord stimulator trialing and possible implantation as well as being scheduled for ketamine infusions.  They are still working on Event organiser. coverage for the ketamine infusions.  I think that the patient is an excellent candidate for both of these possible options.  The patient and I talked about the appropriateness for ketamine infusions due to her wide spectrum of issues for some time now and I am quite pleased to find out that her treating pain specialist's clinic is now doing ketamine infusions that she appears to be an excellent candidate.  She also appears to be an excellent candidate for spinal cord stimulator trialing and possible implantation.  04/24/2020: The patient returns reporting that she has gotten her spinal cord stimulator trialing device and has it implanted currently.  However, there have been mechanical problems with the device and it has been shutting off independently and the patient has not been able to get a good reading on how much it has helped her.  The patient reports that she did experience some brief improvement when she knew that it was on but it would turn off.  She is gone back to the device maker and they could not figure out the problem.  They are going to put a new external device in and extend the spinal cord stimulator trial in face for 2 days.  The patient has also had her first ketamine infusion trial.  She reports that she was told she was hallucinating and dissociative during this phase and experienced some improvement over the rest of the day as far as her mood and pain symptoms but it was over within 24 hours.  The patient is scheduled to have her next ketamine infusion coming up.  08/07/2020: The patient reports that they are looking at doing a new spinal cord stimulator trial as the device  did not work properly during her 7-day trial.  In fact, they attempted to extended to 10 days hoping to get it working correctly.  However, the patient reports that she felt like it did help the first day when it was apparently working but shortly stopped working and it sounds like there were never able to get it working properly.  The patient reports that at this point, her anesthesiologist/pain specialist does not think that she should have any more ketamine trials but the patient would like to try them again.  The patient reports that the doctor felt that she had an adverse reaction to it but her husband was present and did not describe any severe reaction.  At this point, she is planning to talk to him again about possibly having another ketamine infusion if possible.  The patient reports that she continues with her significant chronic pain symptoms but she is managing day-to-day.  She reports that there continues to be a major stressor in her life relative to a very close friend who has terminal cancer and the cancer is continuing to progress.  09/11/2020: The patient reports that her friend is now being managed by hospice and appears to be at the end of her life.  This has been stressful  but the patient has been able to adjust to the impending reality with time and she feels like she is handling it fairly well.  The patient reports that she continues to have significant pain in her arms radiating from her neck.  The patient reports that at this point she has not been able to return for ketamine therapies and not discussed strategies to follow-up with [facility].  11/06/2020: The patient has continued to have a lot of stress with depression and anxiety worsening particular around the impending death of a very close friend.  The friend is dying from metastatic cancer and the patient is one of the primary helpers for her friend.  The patient is very close to this friend.  The patient interacts  with hospice and is spending a lot of time helping.  This is been coinciding with a worsening of her cervical pain radiating down her right arm primarily.  She continues to struggle with severe pain and the depression and anxiety associated around all of these issues has been worse.  12/11/2020: The patient reports that she has had a very stressful time recently.  Her close friend has passed away and while her friend passed away peacefully and was in the care of hospice at the end of her life there is [employer] "shit show" happening around her friend's death.  The patient reports that the friends son who has been very active in the friend's life tragically died just a couple of days after the friend's death in a motor vehicle accident.  The son who had had little interaction with her friend did show up and there was conflict between the friend's new husband and the son regarding ownership of some of the property.  The friend had completed an extensive well that gave all of her estate to the grandchildren but there was still a great deal of arguing.  The husband had lifetime rights to the house for now.  The patient reports that the death of her friend's son happened before the funeral and so they ended up having a joint funeral for the son and the patient's friend.  The patient reports that her depression has been very severe and the fact that she is the executor for her friend's estate has had a great deal of stress.  Her pain continues to be very severe.  Today we continue to work on therapeutic interventions for coping and adjustment in dealing with significant depression and recent exacerbations and stress.  05/02/2021:  Patient continues to struggle with pain and has not returned back to [facility] yet for any interventions.  They are still working on plan.  Patient still with stress over friends death and the fall out over friends grandchild etc.  Still a very complicated situation  10/22/2021: The  patient reports that she has continued to struggle with her chronic pain symptoms but reports that things have been stable as far as overall psychosocial issues and things are going well with her adopted daughter.  The patient reports that there have been ongoing issues with depression and anxiety.  Today we worked on coping and adjustment issues going forward and strategies to manage the symptoms.  12/26/2021: The patient returns today after 84-month break.  The patient continues to struggle with chronic pain symptoms.  The patient has not returned to see pain management group recently and [facility].  The patient reports that this has helped some but she continues with her significant pain.  The patient has  been approved for ketamine therapies through [facility] at [facility] but after 1 trial they felt she was not a good candidate although the patient wanted to continue with these efforts.  I have referred the patient to Dr. Carlis Abbott here in our office for review about the appropriateness of sublingual ketamine treatment as the patient has verbalized willingness to continue with therapy and this strategy but a different type of very small doses delivered sublingually.  The patient is hoping that this will also help with her depression and other symptoms that go along with her pain.  I will sit down with Dr. Carlis Abbott and reviewed this prior to the patient's visit that is scheduled on 01/14/2022.  04/29/2022: The patient returns and reports that she has really been struggling with chronic pain lately and has a particularly bad week so far with regard to pain and fatigue.  Patient saw neurology regarding her memory difficulties and impressions from that were consistent with my impression is that her memory difficulties are likely resulting from her chronic pain, depression and anxiety stress and other functional issues rather than a degenerative neurological condition.  She was assessed for possible  multiple sclerosis and thus far all examinations around that possibility including ANA test and MRI were not indicative of MS.  Patient continues to have a lot of stress and is exacerbated by her complex regional pain symptoms etc.  Patient reports that she has not started back on the small sublingual ketamine doses but could not remember exactly why this was stopped.  She thinks it was around the time when she had significant illness with COVID.  The patient reports that Dr. Carlis Abbott had wanted her to try a different medicine but does not remember what that medicine was.  I will address this issue with Dr. Carlis Abbott.  Diagnosis: Complex regional pain syndrome, fibromyalgia, anxiety and depression, neck, shoulder and back pain.

## 2022-06-23 ENCOUNTER — Encounter
Payer: No Typology Code available for payment source | Attending: Physical Medicine and Rehabilitation | Admitting: Physical Medicine and Rehabilitation

## 2022-06-23 VITALS — BP 150/90 | HR 74 | Ht 68.0 in | Wt 280.0 lb

## 2022-06-23 DIAGNOSIS — G90513 Complex regional pain syndrome I of upper limb, bilateral: Secondary | ICD-10-CM | POA: Insufficient documentation

## 2022-06-23 DIAGNOSIS — G894 Chronic pain syndrome: Secondary | ICD-10-CM | POA: Diagnosis present

## 2022-06-23 DIAGNOSIS — F32A Depression, unspecified: Secondary | ICD-10-CM | POA: Diagnosis present

## 2022-06-23 DIAGNOSIS — M7918 Myalgia, other site: Secondary | ICD-10-CM | POA: Insufficient documentation

## 2022-06-23 DIAGNOSIS — F419 Anxiety disorder, unspecified: Secondary | ICD-10-CM | POA: Insufficient documentation

## 2022-06-23 MED ORDER — LIDOCAINE HCL 1 % IJ SOLN
3.0000 mL | Freq: Once | INTRAMUSCULAR | Status: AC
  Administered 2022-06-23: 3 mL

## 2022-06-23 MED ORDER — SODIUM CHLORIDE (PF) 0.9 % IJ SOLN
2.0000 mL | Freq: Once | INTRAMUSCULAR | Status: AC
  Administered 2022-06-23: 2 mL via INTRAVENOUS

## 2022-06-23 NOTE — Progress Notes (Signed)
Trigger Point Injection  Indication: Cervical myofascial pain not relieved by medication management and other conservative care.  Informed consent was obtained after describing risk and benefits of the procedure with the patient, this includes bleeding, bruising, infection and medication side effects.  The patient wishes to proceed and has given written consent.  The patient was placed in a seated position.  The area of pain was marked and prepped with Betadine.  It was entered with a 25-gauge 1/2 inch needle and a total of 5 mL of 1% lidocaine and normal saline was injected into a total of 8 trigger points, after negative draw back for blood.  The patient tolerated the procedure well.  Post procedure instructions were given.  

## 2022-06-23 NOTE — Addendum Note (Signed)
Addended by: Silas Sacramento T on: 06/23/2022 02:30 PM   Modules accepted: Orders

## 2022-06-24 ENCOUNTER — Encounter (HOSPITAL_BASED_OUTPATIENT_CLINIC_OR_DEPARTMENT_OTHER): Payer: No Typology Code available for payment source | Admitting: Psychology

## 2022-06-24 DIAGNOSIS — G894 Chronic pain syndrome: Secondary | ICD-10-CM | POA: Diagnosis not present

## 2022-06-24 DIAGNOSIS — G90513 Complex regional pain syndrome I of upper limb, bilateral: Secondary | ICD-10-CM

## 2022-06-24 DIAGNOSIS — F419 Anxiety disorder, unspecified: Secondary | ICD-10-CM

## 2022-06-24 DIAGNOSIS — M7918 Myalgia, other site: Secondary | ICD-10-CM | POA: Diagnosis not present

## 2022-06-24 DIAGNOSIS — F32A Depression, unspecified: Secondary | ICD-10-CM

## 2022-06-25 ENCOUNTER — Encounter: Payer: Self-pay | Admitting: Psychology

## 2022-06-25 NOTE — Progress Notes (Signed)
Patient:  Sally [name]   DOB: [birth year]  MR Number: [id number]  Location: [facility] [address] [place], [address] [id number] [facility] [place] [address] Dept: [phone]  Start: 11 AM End: 12 PM  .  Today's visit was an in person visit conducted in outpatient clinic office with the patient myself present.   Provider/Observer:     Hershal Coria PsyD  Chief Complaint:      Chief Complaint  Patient presents with   Anxiety   Depression   Pain   Other    Reason For Service:     Sally [name] is a 47 year old female referred by [facility] medical for therapeutic interventions due to chronic pain symptoms.  The patient reports that she has great difficulty standing, walking for long times, elbow pain, migraines, hip pain, shoulder and neck pain.  The patient fell at work and fractured both her elbows and also had knee replacement surgery.  The patient has been taking maintenance doses of opioid pain medications for some time.  The patient reports that she has a great deal of difficulty functioning and has very poor sleep and wakes up numerous times each night.  The patient also has dealt with depression and anxiety for many years and has been hospitalized for depression anxiety years ago.  She has been receiving counseling for anxiety and depression and takes Trintellix.   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   The patient describes chronic issues of depression anxiety but the development of severe chronic pain.  The patient reports that she is continuing to have some pain  in her back post surgery and had a recent fall.  Follow-up MRI did not suggest any structural damage or injury from this fall.  The patient reports that her symptoms associated with complex regional pain syndrome have worsened recently.  11/01/2019: The patient is continued to have significant pain and distress with worsening episodes of her complex regional pain syndrome and fibromyalgia type symptoms.  The patient has been very frustrated at times with her lack of ability to function and do daily activities.  01/03/2020: The patient reports that she is continued to have significant pain but has particularly had worsening of her fatigue and somnolence.  She reports that she feels like she is sleeping an adequate amount of time but is very tired and fatigued throughout the day.  02/28/2020: The patient reports that she has had some worsening in her significant pain symptoms with cold and atmospheric changes.  She continues to have significant issues with her upper extremities related to complex regional pain syndrome and fibromyalgia.  The patient also reports that she has had significant psychosocial stressors with her closest friend having terminal cancer and being given 3 months to live.  The patient and her friend are talking about the patient taking over care of the patient's granddaughter and the patient also feels a great deal of need to do what ever she can for her friend going forward.  This is a very difficult and stressful situation for the patient on top of her numerous medical and pain issues.  The patient reports that her sleep continues to be improved but limited with pain disrupting sleep patterns.  The patient has been followed by Dr. Myna Hidalgo MD [facility]) for some time for pain management and she is now been referred for an evaluation for spinal cord stimulator trialing and possible implantation as well as being scheduled for ketamine infusions.  They are still working on Stage manager. coverage for the ketamine infusions.  I think that the patient is an excellent candidate for both of these possible options.  The patient and I talked about the appropriateness for ketamine infusions due to her wide spectrum of issues for some time now and I am quite pleased to find out that her treating pain specialist's clinic is now doing ketamine infusions that she appears to be an excellent candidate.  She also appears to be an excellent candidate for spinal cord stimulator trialing and possible implantation.  03/27/2020: The patient reports that she has gone through all of the preliminary work-up to have a spinal cord stimulator trial conducted and is looking forward with hope that it will help with her significant cervical related pain.  The patient is also has an initial appointment to assess for possible ketamine infusion therapies as well.  We reviewed issues related to both the spinal cord stimulator and ketamine infusion with the patient as well as worked on therapeutic interventions around her chronic pain/complex regional pain syndrome diagnoses.  The patient reports that she still has significant psychosocial stressors with her closest friend dying from breast cancer and in the last month or 2 of her life.  At this point, the patient does not look like she will have to take over her friend's granddaughters care as the family is hoping that an uncle of the young girl will take over care.  04/24/2020: The patient returns reporting that she has gotten her spinal cord stimulator trialing device and has it implanted currently.  However, there have been mechanical problems with the device and it has been shutting off independently and the patient has not been able to get a good reading on how much it has helped her.  The patient reports that she did experience some brief improvement when she knew that it was on but it would turn off.  She is gone back to the device maker and they could  not figure out the problem.  They are going to put a new external device in and extend the spinal cord stimulator trial in face for 2 days.  The patient has also had her first ketamine infusion trial.  She reports that she was told she was hallucinating and dissociative during this phase and experienced some improvement over the rest of the day as far as her mood and pain symptoms but it was over within 24 hours.  The patient is scheduled to have her next ketamine infusion coming up.  08/07/2020: The patient reports that they are looking at doing a new spinal cord stimulator trial as the device did not work properly during her 7-day trial.  In fact, they attempted to extended to 10 days hoping to get it working correctly.  However, the patient reports that she felt like it did help the first day when it was apparently working but shortly stopped working and it sounds like there were never able to get it working properly.  The patient reports that at this point, her anesthesiologist/pain specialist does not think that she should have any more  ketamine trials but the patient would like to try them again.  The patient reports that the doctor felt that she had an adverse reaction to it but her husband was present and did not describe any severe reaction.  At this point, she is planning to talk to him again about possibly having another ketamine infusion if possible.  The patient reports that she continues with her significant chronic pain symptoms but she is managing day-to-day.  She reports that there continues to be a major stressor in her life relative to a very close friend who has terminal cancer and the cancer is continuing to progress.  09/11/2020: The patient reports that her friend is now being managed by hospice and appears to be at the end of her life.  This has been stressful but the patient has been able to adjust to the impending reality with time and she feels like she is handling it fairly well.  The  patient reports that she continues to have significant pain in her arms radiating from her neck.  The patient reports that at this point she has not been able to return for ketamine therapies and not discussed strategies to follow-up with [facility].  11/06/2020: The patient has continued to have a lot of stress with depression and anxiety worsening particular around the impending death of a very close friend.  The friend is dying from metastatic cancer and the patient is one of the primary helpers for her friend.  The patient is very close to this friend.  The patient interacts with hospice and is spending a lot of time helping.  This is been coinciding with a worsening of her cervical pain radiating down her right arm primarily.  She continues to struggle with severe pain and the depression and anxiety associated around all of these issues has been worse.  12/11/2020: The patient reports that she has had a very stressful time recently.  Her close friend has passed away and while her friend passed away peacefully and was in the care of hospice at the end of her life there is [employer] "shit show" happening around her friend's death.  The patient reports that the friends son who has been very active in the friend's life tragically died just a couple of days after the friend's death in a motor vehicle accident.  The son who had had little interaction with her friend did show up and there was conflict between the friend's new husband and the son regarding ownership of some of the property.  The friend had completed an extensive well that gave all of her estate to the grandchildren but there was still a great deal of arguing.  The husband had lifetime rights to the house for now.  The patient reports that the death of her friend's son happened before the funeral and so they ended up having a joint funeral for the son and the patient's friend.  The patient reports that her depression has been very severe  and the fact that she is the executor for her friend's estate has had a great deal of stress.  Her pain continues to be very severe.  05/02/2021:  Patient continues to struggle with pain and has not returned back to [facility] yet for any interventions.  They are still working on plan.  Patient still with stress over friends death and the fall out over friends grandchild etc.  Still a very complicated situation.  08/05/2021: The patient reports that she has  had a lot on her plate since I saw her last in April.  The patient reports that she now has her friend's great-granddaughter living permanently with her and is becoming part of her family.  This young lady does have a lot of behavioral issues and it is a challenge for the patient to manage some of the issues.  However, she feels a reward for being able to provide a stable home and follow the wishes of her friend after her friend passed away from cancer.  11-06-2021: The patient reports that she has continued to struggle with her chronic pain symptoms but reports that things have been stable as far as overall psychosocial issues and things are going well with her adopted daughter.  The patient reports that there have been ongoing issues with depression and anxiety.  Today we worked on coping and adjustment issues going forward and strategies to manage the symptoms.  12/26/2021: The patient returns today after 80-month break.  The patient continues to struggle with chronic pain symptoms.  The patient has not returned to see pain management group recently and [facility].  The patient reports that this has helped some but she continues with her significant pain.  The patient has been approved for ketamine therapies through [facility] but after 1 trial they felt she was not a good candidate although the patient wanted to continue with these efforts.  I have referred the patient to Dr. Carlis Abbott here in our office for review about the  appropriateness of sublingual ketamine treatment as the patient has verbalized willingness to continue with therapy and this strategy but a different type of very small doses delivered sublingually.  The patient is hoping that this will also help with her depression and other symptoms that go along with her pain.  I will sit down with Dr. Carlis Abbott and reviewed this prior to the patient's visit that is scheduled on 01/14/2022.  04/29/2022: The patient returns and reports that she has really been struggling with chronic pain lately and has a particularly bad week so far with regard to pain and fatigue.  Patient saw neurology regarding her memory difficulties and impressions from that were consistent with my impression is that her memory difficulties are likely resulting from her chronic pain, depression and anxiety stress and other functional issues rather than a degenerative neurological condition.  She was assessed for possible multiple sclerosis and thus far all examinations around that possibility including ANA test and MRI were not indicative of MS.  Patient continues to have a lot of stress and is exacerbated by her complex regional pain symptoms etc.  Patient reports that she has not started back on the small sublingual ketamine doses but could not remember exactly why this was stopped.  She thinks it was around the time when she had significant illness with COVID.  The patient reports that Dr. Carlis Abbott had wanted her to try a different medicine but does not remember what that medicine was.  I will address this issue with Dr. Carlis Abbott.  06/25/2022: The patient comes in today reporting that her recent injections Dr. Carlis Abbott provided were quite helpful and she has had less pain in her shoulder and neck region.  The patient reports that she hopes for it to last for a month or more.  Patient reports that some of the stressors that have been happening at home have significantly reduced.  She and her husband continue to  struggle with her relationship but they are  trying to work on aspects of their relationship and at this point she is focusing on trying to see if they can manage their relationship rather than separate and she has noted that he has been making efforts as well.  The relationship between her older sons and her husband continues to be strained but her husband is making efforts to try to address some of the conflicts.  This is developed some complications with her son and his wife expected to move into their house for period of time while they save money to buy their own house.  She is apprehensive about how that will play out.  Interventions Strategy:  Cognitive/behavioral therapeutic interventions along with coping skills and strategies around chronic pain, sleep disturbance and depression/anxiety.  Participation Level:   Active  Participation Quality:  Appropriate and Attentive      Behavioral Observation:  Well Groomed, Alert, and Appropriate.   Current Psychosocial Factors: Patient is still looking at having friend's grand daughter come live with patient and will be starting soon.   Content of Session:   Reviewed current symptoms and changes she is experienced since her cervical surgery and pain she experiences radiating down her arms.  We also reviewed strategies for restarting ketamine therapies but with a different strategy including sublingual ketamine.   Current Status:   The patient is hoping that they are able to do another spinal cord stimulator trial as she felt like it was helpful the first day before the device stopped working.  Patient Progress:   The patient reports that her overall sleep has improved from initial status but continues to be problematic.  The patient reports that her vein plays a big role in her difficulty sleeping.  Depression anxiety continue to be quite problematic for the patient.  Impression/Diagnosis:   Sally [name] is a 47 year old female referred by  [facility] medical for therapeutic interventions due to chronic pain symptoms.  The patient reports that she has great difficulty standing, walking for long times, elbow pain, migraines, hip pain, shoulder and neck pain.  The patient fell at work and fractured both her elbows and also had knee replacement surgery.  The patient has been taking maintenance doses of opioid pain medications for some time.  The patient reports that she has a great deal of difficulty functioning and has very poor sleep and wakes up numerous times each night.  The patient also has dealt with depression and anxiety for many years and has been hospitalized for depression anxiety years ago.  She has been receiving counseling for anxiety and depression and takes Trintellix.   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   The patient describes chronic issues of depression anxiety but the development of severe chronic pain  After a fall at work in 2015 where she fractured both of her elbows, injured her knee and developed fibromyalgia/regional pain syndrome.  The patient reports that she has ongoing difficulties walking distances or standing.  She describes elbow pain, trouble lifting things and trouble writing for any period of time.  The patient describes significant sleep disturbance and is unable to fall asleep at night.  She reports that she is always tired.  The patient describes a normal appetite.  The patient describes memory issues related to trouble remembering things and concentration issues.  The patient reports that she is significantly less active with her children and husband and that her husband has to help more due to  her significant pain.  We have continue to work on issues related to her pain and sleep status along with issues of depression and anxiety.  02/28/2020: The patient reports that she has had some worsening in her significant pain symptoms with cold and atmospheric changes.  She continues to have significant issues with her upper extremities related to complex regional pain syndrome and fibromyalgia.  The patient also reports that she has had significant psychosocial stressors with her closest friend having terminal cancer and being given 3 months to live.  The patient and her friend are talking about the patient taking over care of the patient's granddaughter and the patient also feels a great deal of need to do what ever she can for her friend going forward.  This is a very difficult and stressful situation for the patient on top of her numerous medical and pain issues.  The patient reports that her sleep continues to be improved but limited with pain disrupting sleep patterns.  The patient has been followed by Dr. Myna Hidalgo MD [facility]) for some time for pain management and she is now been referred for an evaluation for spinal cord stimulator trialing and possible implantation as well as being scheduled for ketamine infusions.  They are still working on Event organiser. coverage for the ketamine infusions.  I think that the patient is an excellent candidate for both of these possible options.  The patient and I talked about the appropriateness for ketamine infusions due to her wide spectrum of issues for some time now and I am quite pleased to find out that her treating pain specialist's clinic is now doing ketamine infusions that she appears to be an excellent candidate.  She also appears to be an excellent candidate for spinal cord stimulator trialing and possible implantation.  04/24/2020: The patient returns reporting that she has gotten her spinal cord stimulator trialing device and  has it implanted currently.  However, there have been mechanical problems with the device and it has been shutting off independently and the patient has not been able to get a good reading on how much it has helped her.  The patient reports that she did experience some brief improvement when she knew that it was on but it would turn off.  She is gone back to the device maker and they could not figure out the problem.  They are going to put a new external device in and extend the spinal cord stimulator trial in face for 2 days.  The patient has also had her first ketamine infusion trial.  She reports that she was told she was hallucinating and dissociative during this phase and experienced some improvement over the rest of the day as far as her mood and pain symptoms but it was over within 24 hours.  The patient is scheduled to have her next ketamine infusion coming up.  08/07/2020: The patient reports that they are looking at doing a new spinal cord stimulator trial as the device did not work properly during her 7-day trial.  In fact, they attempted to extended to 10 days hoping to get it working correctly.  However, the patient reports that she felt like it did help the first  day when it was apparently working but shortly stopped working and it sounds like there were never able to get it working properly.  The patient reports that at this point, her anesthesiologist/pain specialist does not think that she should have any more ketamine trials but the patient would like to try them again.  The patient reports that the doctor felt that she had an adverse reaction to it but her husband was present and did not describe any severe reaction.  At this point, she is planning to talk to him again about possibly having another ketamine infusion if possible.  The patient reports that she continues with her significant chronic pain symptoms but she is managing day-to-day.  She reports that there continues to be a major  stressor in her life relative to a very close friend who has terminal cancer and the cancer is continuing to progress.  09/11/2020: The patient reports that her friend is now being managed by hospice and appears to be at the end of her life.  This has been stressful but the patient has been able to adjust to the impending reality with time and she feels like she is handling it fairly well.  The patient reports that she continues to have significant pain in her arms radiating from her neck.  The patient reports that at this point she has not been able to return for ketamine therapies and not discussed strategies to follow-up with [facility].  11/06/2020: The patient has continued to have a lot of stress with depression and anxiety worsening particular around the impending death of a very close friend.  The friend is dying from metastatic cancer and the patient is one of the primary helpers for her friend.  The patient is very close to this friend.  The patient interacts with hospice and is spending a lot of time helping.  This is been coinciding with a worsening of her cervical pain radiating down her right arm primarily.  She continues to struggle with severe pain and the depression and anxiety associated around all of these issues has been worse.  12/11/2020: The patient reports that she has had a very stressful time recently.  Her close friend has passed away and while her friend passed away peacefully and was in the care of hospice at the end of her life there is [employer] "shit show" happening around her friend's death.  The patient reports that the friends son who has been very active in the friend's life tragically died just a couple of days after the friend's death in a motor vehicle accident.  The son who had had little interaction with her friend did show up and there was conflict between the friend's new husband and the son regarding ownership of some of the property.  The friend had  completed an extensive well that gave all of her estate to the grandchildren but there was still a great deal of arguing.  The husband had lifetime rights to the house for now.  The patient reports that the death of her friend's son happened before the funeral and so they ended up having a joint funeral for the son and the patient's friend.  The patient reports that her depression has been very severe and the fact that she is the executor for her friend's estate has had a great deal of stress.  Her pain continues to be very severe.  Today we continue to work on therapeutic interventions for coping and adjustment in dealing  with significant depression and recent exacerbations and stress.  05/02/2021:  Patient continues to struggle with pain and has not returned back to [facility] yet for any interventions.  They are still working on plan.  Patient still with stress over friends death and the fall out over friends grandchild etc.  Still a very complicated situation  10/22/2021: The patient reports that she has continued to struggle with her chronic pain symptoms but reports that things have been stable as far as overall psychosocial issues and things are going well with her adopted daughter.  The patient reports that there have been ongoing issues with depression and anxiety.  Today we worked on coping and adjustment issues going forward and strategies to manage the symptoms.  12/26/2021: The patient returns today after 7-month break.  The patient continues to struggle with chronic pain symptoms.  The patient has not returned to see pain management group recently and [facility].  The patient reports that this has helped some but she continues with her significant pain.  The patient has been approved for ketamine therapies through [facility] but after 1 trial they felt she was not a good candidate although the patient wanted to continue with these efforts.  I have referred the patient  to Dr. Carlis Abbott here in our office for review about the appropriateness of sublingual ketamine treatment as the patient has verbalized willingness to continue with therapy and this strategy but a different type of very small doses delivered sublingually.  The patient is hoping that this will also help with her depression and other symptoms that go along with her pain.  I will sit down with Dr. Carlis Abbott and reviewed this prior to the patient's visit that is scheduled on 01/14/2022.  04/29/2022: The patient returns and reports that she has really been struggling with chronic pain lately and has a particularly bad week so far with regard to pain and fatigue.  Patient saw neurology regarding her memory difficulties and impressions from that were consistent with my impression is that her memory difficulties are likely resulting from her chronic pain, depression and anxiety stress and other functional issues rather than a degenerative neurological condition.  She was assessed for possible multiple sclerosis and thus far all examinations around that possibility including ANA test and MRI were not indicative of MS.  Patient continues to have a lot of stress and is exacerbated by her complex regional pain symptoms etc.  Patient reports that she has not started back on the small sublingual ketamine doses but could not remember exactly why this was stopped.  She thinks it was around the time when she had significant illness with COVID.  The patient reports that Dr. Carlis Abbott had wanted her to try a different medicine but does not remember what that medicine was.  I will address this issue with Dr. Carlis Abbott.  06/25/2022: The patient comes in today reporting that her recent injections Dr. Carlis Abbott provided were quite helpful and she has had less pain in her shoulder and neck region.  The patient reports that she hopes for it to last for a month or more.  Patient reports that some of the stressors that have been happening at home have  significantly reduced.  She and her husband continue to struggle with her relationship but they are trying to work on aspects of their relationship and at this point she is focusing on trying to see if they can manage their relationship rather than separate and she  has noted that he has been making efforts as well.  The relationship between her older sons and her husband continues to be strained but her husband is making efforts to try to address some of the conflicts.  This is developed some complications with her son and his wife expected to move into their house for period of time while they save money to buy their own house.  She is apprehensive about how that will play out.  Diagnosis: Complex regional pain syndrome, fibromyalgia, anxiety and depression, neck, shoulder and back pain.

## 2022-07-28 ENCOUNTER — Encounter: Payer: Self-pay | Admitting: Physical Medicine and Rehabilitation

## 2022-07-28 ENCOUNTER — Encounter
Payer: No Typology Code available for payment source | Attending: Physical Medicine and Rehabilitation | Admitting: Physical Medicine and Rehabilitation

## 2022-07-28 VITALS — BP 134/86 | HR 82 | Ht 68.0 in | Wt 277.8 lb

## 2022-07-28 DIAGNOSIS — M5010 Cervical disc disorder with radiculopathy, unspecified cervical region: Secondary | ICD-10-CM | POA: Insufficient documentation

## 2022-07-28 DIAGNOSIS — M7918 Myalgia, other site: Secondary | ICD-10-CM | POA: Diagnosis not present

## 2022-07-28 MED ORDER — CAPSAICIN-CLEANSING GEL 8 % EX KIT
1.0000 | PACK | Freq: Once | CUTANEOUS | Status: AC
  Administered 2022-07-28: 1 via TOPICAL

## 2022-07-28 NOTE — Patient Instructions (Signed)
Red light therapy 
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
## 2022-07-29 ENCOUNTER — Encounter: Payer: No Typology Code available for payment source | Admitting: Psychology

## 2022-08-07 ENCOUNTER — Encounter: Payer: No Typology Code available for payment source | Admitting: Physical Medicine and Rehabilitation

## 2022-08-12 ENCOUNTER — Encounter: Payer: No Typology Code available for payment source | Admitting: Psychology

## 2022-10-15 ENCOUNTER — Telehealth (INDEPENDENT_AMBULATORY_CARE_PROVIDER_SITE_OTHER): Payer: Self-pay

## 2022-10-15 NOTE — Telephone Encounter (Signed)
Call to patient today to remind her that she needs to come to the clinic about [phone] for fasting IC.  Patient states that she was aware and will be here early. Patient verbalized understanding.   No other issues, call ended.

## 2022-10-21 ENCOUNTER — Ambulatory Visit (INDEPENDENT_AMBULATORY_CARE_PROVIDER_SITE_OTHER): Payer: Medicare Other | Admitting: Physician Assistant

## 2022-10-28 ENCOUNTER — Encounter: Payer: No Typology Code available for payment source | Admitting: Physical Medicine and Rehabilitation

## 2022-11-10 ENCOUNTER — Ambulatory Visit (INDEPENDENT_AMBULATORY_CARE_PROVIDER_SITE_OTHER): Payer: Medicare Other | Admitting: Physician Assistant

## 2022-12-17 ENCOUNTER — Encounter: Payer: No Typology Code available for payment source | Attending: Psychology | Admitting: Psychology

## 2022-12-17 DIAGNOSIS — G90513 Complex regional pain syndrome I of upper limb, bilateral: Secondary | ICD-10-CM

## 2022-12-17 DIAGNOSIS — F419 Anxiety disorder, unspecified: Secondary | ICD-10-CM | POA: Diagnosis present

## 2022-12-17 DIAGNOSIS — R4189 Other symptoms and signs involving cognitive functions and awareness: Secondary | ICD-10-CM

## 2022-12-17 DIAGNOSIS — M5010 Cervical disc disorder with radiculopathy, unspecified cervical region: Secondary | ICD-10-CM | POA: Diagnosis not present

## 2022-12-17 DIAGNOSIS — F32A Depression, unspecified: Secondary | ICD-10-CM

## 2022-12-19 NOTE — Progress Notes (Signed)
Patient:  Sally [name]   DOB: 07/19/1975  MR Number: [id number]  Location: [facility] A DEPT OF [facility] [address] [place], [address] [place] [address] Dept: [phone]  Start: 9 AM End: 10 AM  .  Today's visit was an in person visit conducted in outpatient clinic office with the patient myself present.   Provider/Observer:     Hershal Coria PsyD  Chief Complaint:      Chief Complaint  Patient presents with   Pain   Anxiety   Depression   Stress   Sleeping Problem    Reason For Service:     Sally [name] is a 47 year old female referred by [facility] medical for therapeutic interventions due to chronic pain symptoms.  The patient reports that she has great difficulty standing, walking for long times, elbow pain, migraines, hip pain, shoulder and neck pain.  The patient fell at work and fractured both her elbows and also had knee replacement surgery.  The patient has been taking maintenance doses of opioid pain medications for some time.  The patient reports that she has a great deal of difficulty functioning and has very poor sleep and wakes up numerous times each night.  The patient also has dealt with depression and anxiety for many years and has been hospitalized for depression anxiety years ago.  She has been receiving counseling for anxiety and depression and takes Trintellix.   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   The patient describes chronic issues of depression anxiety but the development of severe chronic pain.  The patient reports that she  is continuing to have some pain in her back post surgery and had a recent fall.  Follow-up MRI did not suggest any structural damage or injury from this fall.  The patient reports that her symptoms associated with complex regional pain syndrome have worsened recently.  11/01/2019: The patient is continued to have significant pain and distress with worsening episodes of her complex regional pain syndrome and fibromyalgia type symptoms.  The patient has been very frustrated at times with her lack of ability to function and do daily activities.  01/03/2020: The patient reports that she is continued to have significant pain but has particularly had worsening of her fatigue and somnolence.  She reports that she feels like she is sleeping an adequate amount of time but is very tired and fatigued throughout the day.  02/28/2020: The patient reports that she has had some worsening in her significant pain symptoms with cold and atmospheric changes.  She continues to have significant issues with her upper extremities related to complex regional pain syndrome and fibromyalgia.  The patient also reports that she has had significant psychosocial stressors with her closest friend having terminal cancer and being given 3 months to live.  The patient and her friend are talking about the patient taking over care of the patient's granddaughter and the patient also feels a great deal of need to do what ever she can for her friend going forward.  This is a very difficult and stressful situation  for the patient on top of her numerous medical and pain issues.  The patient reports that her sleep continues to be improved but limited with pain disrupting sleep patterns.  The patient has been followed by Dr. Myna Hidalgo MD [facility]) for some time for pain management and she is now been referred for an evaluation for spinal cord stimulator trialing and possible implantation as well as being scheduled for ketamine infusions.  They  are still working on Event organiser. coverage for the ketamine infusions.  I think that the patient is an excellent candidate for both of these possible options.  The patient and I talked about the appropriateness for ketamine infusions due to her wide spectrum of issues for some time now and I am quite pleased to find out that her treating pain specialist's clinic is now doing ketamine infusions that she appears to be an excellent candidate.  She also appears to be an excellent candidate for spinal cord stimulator trialing and possible implantation.  03/27/2020: The patient reports that she has gone through all of the preliminary work-up to have a spinal cord stimulator trial conducted and is looking forward with hope that it will help with her significant cervical related pain.  The patient is also has an initial appointment to assess for possible ketamine infusion therapies as well.  We reviewed issues related to both the spinal cord stimulator and ketamine infusion with the patient as well as worked on therapeutic interventions around her chronic pain/complex regional pain syndrome diagnoses.  The patient reports that she still has significant psychosocial stressors with her closest friend dying from breast cancer and in the last month or 2 of her life.  At this point, the patient does not look like she will have to take over her friend's granddaughters care as the family is hoping that an uncle of the young girl will take over care.  04/24/2020: The patient returns reporting that she has gotten her spinal cord stimulator trialing device and has it implanted currently.  However, there have been mechanical problems with the device and it has been shutting off independently and the patient has not been able to get a good reading on how much it has helped her.  The patient reports that she did experience some brief improvement when she knew that it was on but it would turn off.  She is gone back to  the device maker and they could not figure out the problem.  They are going to put a new external device in and extend the spinal cord stimulator trial in face for 2 days.  The patient has also had her first ketamine infusion trial.  She reports that she was told she was hallucinating and dissociative during this phase and experienced some improvement over the rest of the day as far as her mood and pain symptoms but it was over within 24 hours.  The patient is scheduled to have her next ketamine infusion coming up.  08/07/2020: The patient reports that they are looking at doing a new spinal cord stimulator trial as the device did not work properly during her 7-day trial.  In fact, they attempted to extended to 10 days hoping to get it working correctly.  However, the patient reports that she felt like it did help the first day when it was apparently working but shortly stopped working and it sounds like there were never able to get it working properly.  The patient reports that at this  point, her anesthesiologist/pain specialist does not think that she should have any more ketamine trials but the patient would like to try them again.  The patient reports that the doctor felt that she had an adverse reaction to it but her husband was present and did not describe any severe reaction.  At this point, she is planning to talk to him again about possibly having another ketamine infusion if possible.  The patient reports that she continues with her significant chronic pain symptoms but she is managing day-to-day.  She reports that there continues to be a major stressor in her life relative to a very close friend who has terminal cancer and the cancer is continuing to progress.  09/11/2020: The patient reports that her friend is now being managed by hospice and appears to be at the end of her life.  This has been stressful but the patient has been able to adjust to the impending reality with time and she feels like she is  handling it fairly well.  The patient reports that she continues to have significant pain in her arms radiating from her neck.  The patient reports that at this point she has not been able to return for ketamine therapies and not discussed strategies to follow-up with [facility].  11/06/2020: The patient has continued to have a lot of stress with depression and anxiety worsening particular around the impending death of a very close friend.  The friend is dying from metastatic cancer and the patient is one of the primary helpers for her friend.  The patient is very close to this friend.  The patient interacts with hospice and is spending a lot of time helping.  This is been coinciding with a worsening of her cervical pain radiating down her right arm primarily.  She continues to struggle with severe pain and the depression and anxiety associated around all of these issues has been worse.  12/11/2020: The patient reports that she has had a very stressful time recently.  Her close friend has passed away and while her friend passed away peacefully and was in the care of hospice at the end of her life there is [employer] "shit show" happening around her friend's death.  The patient reports that the friends son who has been very active in the friend's life tragically died just a couple of days after the friend's death in a motor vehicle accident.  The son who had had little interaction with her friend did show up and there was conflict between the friend's new husband and the son regarding ownership of some of the property.  The friend had completed an extensive well that gave all of her estate to the grandchildren but there was still a great deal of arguing.  The husband had lifetime rights to the house for now.  The patient reports that the death of her friend's son happened before the funeral and so they ended up having a joint funeral for the son and the patient's friend.  The patient reports that her  depression has been very severe and the fact that she is the executor for her friend's estate has had a great deal of stress.  Her pain continues to be very severe.  05/02/2021:  Patient continues to struggle with pain and has not returned back to [facility] yet for any interventions.  They are still working on plan.  Patient still with stress over friends death and the fall out over friends grandchild etc.  Still a very complicated situation.  08/05/2021: The patient reports that she has had a lot on her plate since I saw her last in April.  The patient reports that she now has her friend's great-granddaughter living permanently with her and is becoming part of her family.  This young lady does have a lot of behavioral issues and it is a challenge for the patient to manage some of the issues.  However, she feels a reward for being able to provide a stable home and follow the wishes of her friend after her friend passed away from cancer.  10-28-2021: The patient reports that she has continued to struggle with her chronic pain symptoms but reports that things have been stable as far as overall psychosocial issues and things are going well with her adopted daughter.  The patient reports that there have been ongoing issues with depression and anxiety.  Today we worked on coping and adjustment issues going forward and strategies to manage the symptoms.  12/26/2021: The patient returns today after 13-month break.  The patient continues to struggle with chronic pain symptoms.  The patient has not returned to see pain management group recently and [facility].  The patient reports that this has helped some but she continues with her significant pain.  The patient has been approved for ketamine therapies through [facility] but after 1 trial they felt she was not a good candidate although the patient wanted to continue with these efforts.  I have referred the patient to Dr. Carlis Abbott here in  our office for review about the appropriateness of sublingual ketamine treatment as the patient has verbalized willingness to continue with therapy and this strategy but a different type of very small doses delivered sublingually.  The patient is hoping that this will also help with her depression and other symptoms that go along with her pain.  I will sit down with Dr. Carlis Abbott and reviewed this prior to the patient's visit that is scheduled on 01/14/2022.  04/29/2022: The patient returns and reports that she has really been struggling with chronic pain lately and has a particularly bad week so far with regard to pain and fatigue.  Patient saw neurology regarding her memory difficulties and impressions from that were consistent with my impression is that her memory difficulties are likely resulting from her chronic pain, depression and anxiety stress and other functional issues rather than a degenerative neurological condition.  She was assessed for possible multiple sclerosis and thus far all examinations around that possibility including ANA test and MRI were not indicative of MS.  Patient continues to have a lot of stress and is exacerbated by her complex regional pain symptoms etc.  Patient reports that she has not started back on the small sublingual ketamine doses but could not remember exactly why this was stopped.  She thinks it was around the time when she had significant illness with COVID.  The patient reports that Dr. Carlis Abbott had wanted her to try a different medicine but does not remember what that medicine was.  I will address this issue with Dr. Carlis Abbott.  06/25/2022: The patient comes in today reporting that her recent injections Dr. Carlis Abbott provided were quite helpful and she has had less pain in her shoulder and neck region.  The patient reports that she hopes for it to last for a month or more.  Patient reports that some of the stressors that have been happening at home have significantly reduced.  She and her husband continue to struggle with her relationship but they are trying to work on aspects of their relationship and at this point she is focusing on trying to see if they can manage their relationship rather than separate and she has noted that he has been making efforts as well.  The relationship between her older sons and her husband continues to be strained but her husband is making efforts to try to address some of the conflicts.  This is developed some complications with her son and his wife expected to move into their house for period of time while they save money to buy their own house.  She is apprehensive about how that will play out.  12/15/2022: The patient comes in reporting that there have been a lot of psychosocial stressors with her and her husband as they are now separated and the patient's husband living outside of the house.  One of the big major issues had to do with her oldest son and some of the challenges that the son poses particularly for her husband.  This son is now planning to move in with his girlfriend into her house which is also causing a great deal of stress for the patient.  We addressed this issue a great deal today and continues to be problematic for her.  The patient continues with her significant pain symptoms particularly shoulder issues are playing a big impact on her overall pain now.  Patient is being considered for shoulder surgery but she is concerned how the potential surgery could impact her regional pain syndrome.  The patient is also being considered for new trial of the spinal cord stimulator.  Interventions Strategy:  Cognitive/behavioral therapeutic interventions along with coping skills and strategies around chronic pain, sleep disturbance and depression/anxiety.  Participation Level:   Active  Participation Quality:  Appropriate and Attentive      Behavioral Observation:  Well Groomed, Alert, and Appropriate.   Current Psychosocial  Factors: Patient is still looking at having friend's grand daughter come live with patient and will be starting soon.   Content of Session:   Reviewed current symptoms and changes she is experienced since her cervical surgery and pain she experiences radiating down her arms.  We also reviewed strategies for restarting ketamine therapies but with a different strategy including sublingual ketamine.   Current Status:   The patient is hoping that they are able to do another spinal cord stimulator trial as she felt like it was helpful the first day before the device stopped working.  Patient Progress:   The patient reports that her overall sleep has improved from initial status but continues to be problematic.  The patient reports that her vein plays a big role in her difficulty sleeping.  Depression anxiety continue to be quite problematic for the patient.  Impression/Diagnosis:   Sally [name] is a 47 year old female referred by [facility] medical for therapeutic interventions due to chronic pain symptoms.  The patient reports that she has great difficulty standing, walking for long times, elbow pain, migraines, hip pain, shoulder and neck pain.  The patient fell at work and fractured both her elbows and also had knee replacement surgery.  The patient has been taking maintenance doses of opioid pain medications for some time.  The patient reports that she has a great deal of difficulty functioning and has very poor sleep and wakes up numerous times each night.  The patient also has dealt with depression and anxiety for many years and  has been hospitalized for depression anxiety years ago.  She has been receiving counseling for anxiety and depression and takes Trintellix.   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   The patient describes chronic issues of depression anxiety but the development of severe chronic pain  After a fall at work in 2015 where she fractured both of her elbows, injured her knee and developed fibromyalgia/regional pain syndrome.  The patient reports that she has ongoing difficulties walking distances or standing.  She describes elbow pain, trouble lifting things and trouble writing for any period of time.  The patient describes significant sleep disturbance and is unable to fall asleep at night.  She reports that she is always tired.  The patient describes a normal appetite.  The patient describes memory issues related to trouble remembering things and concentration issues.  The patient reports that she is significantly less active with her children and husband and that her husband has to help more due to her significant pain.  We have continue to work on issues related to her pain and sleep status along with issues of depression and anxiety.  02/28/2020: The patient reports that she has had some worsening in her significant pain symptoms with cold and atmospheric changes.  She continues to have significant issues with her upper extremities related to complex regional pain syndrome and fibromyalgia.  The patient also reports that she has had significant psychosocial stressors with her closest friend having terminal cancer and being given 3 months to live.  The patient and her friend are talking about the patient taking over care of the patient's granddaughter and the patient also feels a great deal of need to do what ever she can for her friend going forward.  This is a very difficult and stressful situation for the patient on top of her numerous medical and pain issues.  The patient reports that her sleep continues to be improved but limited with pain disrupting sleep patterns.   The patient has been followed by Dr. Myna Hidalgo MD [facility]) for some time for pain management and she is now been referred for an evaluation for spinal cord stimulator trialing and possible implantation as well as being scheduled for ketamine infusions.  They are still working on Event organiser. coverage for the ketamine infusions.  I think that the patient is an excellent candidate for both of these possible options.  The patient and I talked about the appropriateness for ketamine infusions due to her wide spectrum of issues for some time now and I am quite pleased to find out that her treating pain specialist's clinic is now doing ketamine infusions that she appears to be an excellent candidate.  She also appears to be an excellent candidate for spinal cord stimulator trialing and possible implantation.  04/24/2020: The patient returns reporting that she has gotten her spinal cord stimulator trialing device and has it implanted currently.  However, there have been mechanical problems with the device and it has been shutting off independently and the patient has not been able to get a good reading on how much it has helped her.  The patient reports that she did  experience some brief improvement when she knew that it was on but it would turn off.  She is gone back to the device maker and they could not figure out the problem.  They are going to put a new external device in and extend the spinal cord stimulator trial in face for 2 days.  The patient has also had her first ketamine infusion trial.  She reports that she was told she was hallucinating and dissociative during this phase and experienced some improvement over the rest of the day as far as her mood and pain symptoms but it was over within 24 hours.  The patient is scheduled to have her next ketamine infusion coming up.  08/07/2020: The patient reports that they are looking at doing a new spinal cord stimulator trial as the device  did not work properly during her 7-day trial.  In fact, they attempted to extended to 10 days hoping to get it working correctly.  However, the patient reports that she felt like it did help the first day when it was apparently working but shortly stopped working and it sounds like there were never able to get it working properly.  The patient reports that at this point, her anesthesiologist/pain specialist does not think that she should have any more ketamine trials but the patient would like to try them again.  The patient reports that the doctor felt that she had an adverse reaction to it but her husband was present and did not describe any severe reaction.  At this point, she is planning to talk to him again about possibly having another ketamine infusion if possible.  The patient reports that she continues with her significant chronic pain symptoms but she is managing day-to-day.  She reports that there continues to be a major stressor in her life relative to a very close friend who has terminal cancer and the cancer is continuing to progress.  09/11/2020: The patient reports that her friend is now being managed by hospice and appears to be at the end of her life.  This has been stressful but the patient has been able to adjust to the impending reality with time and she feels like she is handling it fairly well.  The patient reports that she continues to have significant pain in her arms radiating from her neck.  The patient reports that at this point she has not been able to return for ketamine therapies and not discussed strategies to follow-up with [facility].  11/06/2020: The patient has continued to have a lot of stress with depression and anxiety worsening particular around the impending death of a very close friend.  The friend is dying from metastatic cancer and the patient is one of the primary helpers for her friend.  The patient is very close to this friend.  The patient interacts  with hospice and is spending a lot of time helping.  This is been coinciding with a worsening of her cervical pain radiating down her right arm primarily.  She continues to struggle with severe pain and the depression and anxiety associated around all of these issues has been worse.  12/11/2020: The patient reports that she has had a very stressful time recently.  Her close friend has passed away and while her friend passed away peacefully and was in the care of hospice at the end of her life there is [employer] "shit show" happening around her friend's death.  The patient reports that the friends son who  has been very active in the friend's life tragically died just a couple of days after the friend's death in a motor vehicle accident.  The son who had had little interaction with her friend did show up and there was conflict between the friend's new husband and the son regarding ownership of some of the property.  The friend had completed an extensive well that gave all of her estate to the grandchildren but there was still a great deal of arguing.  The husband had lifetime rights to the house for now.  The patient reports that the death of her friend's son happened before the funeral and so they ended up having a joint funeral for the son and the patient's friend.  The patient reports that her depression has been very severe and the fact that she is the executor for her friend's estate has had a great deal of stress.  Her pain continues to be very severe.  Today we continue to work on therapeutic interventions for coping and adjustment in dealing with significant depression and recent exacerbations and stress.  05/02/2021:  Patient continues to struggle with pain and has not returned back to [facility] yet for any interventions.  They are still working on plan.  Patient still with stress over friends death and the fall out over friends grandchild etc.  Still a very complicated situation  10/22/2021: The  patient reports that she has continued to struggle with her chronic pain symptoms but reports that things have been stable as far as overall psychosocial issues and things are going well with her adopted daughter.  The patient reports that there have been ongoing issues with depression and anxiety.  Today we worked on coping and adjustment issues going forward and strategies to manage the symptoms.  12/26/2021: The patient returns today after 30-month break.  The patient continues to struggle with chronic pain symptoms.  The patient has not returned to see pain management group recently and [facility].  The patient reports that this has helped some but she continues with her significant pain.  The patient has been approved for ketamine therapies through [facility] but after 1 trial they felt she was not a good candidate although the patient wanted to continue with these efforts.  I have referred the patient to Dr. Carlis Abbott here in our office for review about the appropriateness of sublingual ketamine treatment as the patient has verbalized willingness to continue with therapy and this strategy but a different type of very small doses delivered sublingually.  The patient is hoping that this will also help with her depression and other symptoms that go along with her pain.  I will sit down with Dr. Carlis Abbott and reviewed this prior to the patient's visit that is scheduled on 01/14/2022.  04/29/2022: The patient returns and reports that she has really been struggling with chronic pain lately and has a particularly bad week so far with regard to pain and fatigue.  Patient saw neurology regarding her memory difficulties and impressions from that were consistent with my impression is that her memory difficulties are likely resulting from her chronic pain, depression and anxiety stress and other functional issues rather than a degenerative neurological condition.  She was assessed for possible  multiple sclerosis and thus far all examinations around that possibility including ANA test and MRI were not indicative of MS.  Patient continues to have a lot of stress and is exacerbated by her complex regional pain symptoms etc.  Patient reports that she has not started back on the small sublingual ketamine doses but could not remember exactly why this was stopped.  She thinks it was around the time when she had significant illness with COVID.  The patient reports that Dr. Carlis Abbott had wanted her to try a different medicine but does not remember what that medicine was.  I will address this issue with Dr. Carlis Abbott.  06/25/2022: The patient comes in today reporting that her recent injections Dr. Carlis Abbott provided were quite helpful and she has had less pain in her shoulder and neck region.  The patient reports that she hopes for it to last for a month or more.  Patient reports that some of the stressors that have been happening at home have significantly reduced.  She and her husband continue to struggle with her relationship but they are trying to work on aspects of their relationship and at this point she is focusing on trying to see if they can manage their relationship rather than separate and she has noted that he has been making efforts as well.  The relationship between her older sons and her husband continues to be strained but her husband is making efforts to try to address some of the conflicts.  This is developed some complications with her son and his wife expected to move into their house for period of time while they save money to buy their own house.  She is apprehensive about how that will play out.  12/15/2022: The patient comes in reporting that there have been a lot of psychosocial stressors with her and her husband as they are now separated and the patient's husband living outside of the house.  One of the big major issues had to do with her oldest son and some of the challenges that the son  poses particularly for her husband.  This son is now planning to move in with his girlfriend into her house which is also causing a great deal of stress for the patient.  We addressed this issue a great deal today and continues to be problematic for her.  The patient continues with her significant pain symptoms particularly shoulder issues are playing a big impact on her overall pain now.  Patient is being considered for shoulder surgery but she is concerned how the potential surgery could impact her regional pain syndrome.  The patient is also being considered for new trial of the spinal cord stimulator.  Diagnosis: Complex regional pain syndrome, fibromyalgia, anxiety and depression, neck, shoulder and back pain.

## 2023-01-24 ENCOUNTER — Other Ambulatory Visit: Payer: Self-pay

## 2023-01-24 ENCOUNTER — Emergency Department (HOSPITAL_BASED_OUTPATIENT_CLINIC_OR_DEPARTMENT_OTHER): Payer: Medicare PPO

## 2023-01-24 ENCOUNTER — Emergency Department (HOSPITAL_BASED_OUTPATIENT_CLINIC_OR_DEPARTMENT_OTHER)
Admission: EM | Admit: 2023-01-24 | Discharge: 2023-01-25 | Disposition: A | Discharge status: Home / Self Care | Payer: Medicare PPO | Attending: Emergency Medicine | Admitting: Emergency Medicine

## 2023-01-24 ENCOUNTER — Encounter (HOSPITAL_BASED_OUTPATIENT_CLINIC_OR_DEPARTMENT_OTHER): Payer: Self-pay | Admitting: Urology

## 2023-01-24 DIAGNOSIS — M62838 Other muscle spasm: Secondary | ICD-10-CM | POA: Diagnosis not present

## 2023-01-24 DIAGNOSIS — M542 Cervicalgia: Secondary | ICD-10-CM | POA: Insufficient documentation

## 2023-01-24 DIAGNOSIS — R202 Paresthesia of skin: Secondary | ICD-10-CM | POA: Insufficient documentation

## 2023-01-24 LAB — COMPREHENSIVE METABOLIC PANEL
ALT: 16 U/L (ref 0–44)
AST: 14 U/L — ABNORMAL LOW (ref 15–41)
Albumin: 3.9 g/dL (ref 3.5–5.0)
Alkaline Phosphatase: 104 U/L (ref 38–126)
Anion gap: 6 (ref 5–15)
BUN: 11 mg/dL (ref 6–20)
CO2: 29 mmol/L (ref 22–32)
Calcium: 9.1 mg/dL (ref 8.9–10.3)
Chloride: 104 mmol/L (ref 98–111)
Creatinine, Ser: 0.7 mg/dL (ref 0.44–1.00)
GFR, Estimated: >60 mL/min (ref >60)
Glucose, Bld: 112 mg/dL — ABNORMAL HIGH (ref 70–99)
Potassium: 4.1 mmol/L (ref 3.5–5.1)
Sodium: 139 mmol/L (ref 135–145)
Total Bilirubin: 0.3 mg/dL (ref 0.0–1.2)
Total Protein: 7 g/dL (ref 6.5–8.1)

## 2023-01-24 LAB — CBC WITH DIFFERENTIAL/PLATELET
Abs Immature Granulocytes: 0.03 10*3/uL (ref 0.00–0.07)
Basophils Absolute: 0.1 10*3/uL (ref 0.0–0.1)
Basophils Relative: 1 %
Eosinophils Absolute: 0.3 10*3/uL (ref 0.0–0.5)
Eosinophils Relative: 3 %
HCT: 44.9 % (ref 36.0–46.0)
Hemoglobin: 14.5 g/dL (ref 12.0–15.0)
Immature Granulocytes: 0 %
Lymphocytes Relative: 32 %
Lymphs Abs: 2.7 10*3/uL (ref 0.7–4.0)
MCH: 30.8 pg (ref 26.0–34.0)
MCHC: 32.3 g/dL (ref 30.0–36.0)
MCV: 95.3 fL (ref 80.0–100.0)
Monocytes Absolute: 0.6 10*3/uL (ref 0.1–1.0)
Monocytes Relative: 8 %
Neutro Abs: 4.6 10*3/uL (ref 1.7–7.7)
Neutrophils Relative %: 56 %
Platelets: 260 10*3/uL (ref 150–400)
RBC: 4.71 MIL/uL (ref 3.87–5.11)
RDW: 13.1 % (ref 11.5–15.5)
WBC: 8.3 10*3/uL (ref 4.0–10.5)
nRBC: 0 % (ref 0.0–0.2)

## 2023-01-24 LAB — MAGNESIUM: Magnesium: 2 mg/dL (ref 1.7–2.4)

## 2023-01-24 MED ORDER — HYDROMORPHONE HCL 1 MG/ML IJ SOLN
1.0000 mg | Freq: Once | INTRAMUSCULAR | Status: AC
  Administered 2023-01-24: 1 mg via INTRAVENOUS
  Filled 2023-01-24: qty 1

## 2023-01-24 MED ORDER — CYCLOBENZAPRINE HCL 10 MG PO TABS: 10.0000 mg | ORAL_TABLET | Freq: Two times a day (BID) | ORAL | 0 refills | Status: DC | PRN

## 2023-01-24 MED ORDER — LIDOCAINE 5 % EX PTCH: 1.0000 | MEDICATED_PATCH | CUTANEOUS | 0 refills | Status: DC

## 2023-01-24 MED ORDER — CYCLOBENZAPRINE HCL 10 MG PO TABS
10.0000 mg | ORAL_TABLET | Freq: Once | ORAL | Status: AC
  Administered 2023-01-24: 10 mg via ORAL
  Filled 2023-01-24: qty 1

## 2023-01-24 NOTE — Discharge Instructions (Addendum)
 Your history, exam, and evaluation today did not reveal evidence of acute fracture or hardware problem in your neck.  Your exam revealed tenderness and muscle spasms in the left neck and left back so please use the different muscle relaxant and Lidoderm  patches to help with symptoms.  Please do not take the old muscle relaxant at the same time.  I do recommend calling your neck doctor and pain doctors to discuss further outpatient management.  We discussed with a shared decision-making conversation transfer for MRI tonight but given your lack of persistent neurologic deficits we agreed with hold on transfer at this time.  Your electrolytes were reassuring.  Please continue to rest and stay hydrated and follow-up.  If any symptoms change or worsen acutely, return to the nearest emergency department.

## 2023-01-24 NOTE — ED Provider Notes (Signed)
 [place] EMERGENCY DEPARTMENT AT [facility] Provider Note   CSN: [id number] Arrival date & time: 01/24/23  1416     History  Chief Complaint  Patient presents with   Neck Pain    Sally [name] is a 48 y.o. female.  The history is provided by the patient and medical records. No language interpreter was used.  Neck Pain Pain location:  L side and occipital region Quality:  Burning, aching and cramping Pain radiates to:  Head, L arm, L scapula and L shoulder Pain severity:  Severe Pain is:  Unable to specify Onset quality:  Gradual Duration:  4 days Timing:  Constant Progression:  Waxing and waning Chronicity: worse than chronic pain. Context: not fall   Relieved by:  Nothing Worsened by:  Nothing Ineffective treatments: home pain meds. Associated symptoms: tingling   Associated symptoms: no bladder incontinence, no bowel incontinence, no chest pain, no fever, no headaches, no leg pain, no numbness, no paresis, no photophobia, no syncope, no visual change and no weakness   Risk factors: no recent head injury        Home Medications Prior to Admission medications   Medication Sig Start Date End Date Taking? Authorizing Provider  ALPRAZolam (XANAX) 0.25 MG tablet Take 0.25 mg by mouth daily. 01/01/22   [provider]  Aspirin-Acetaminophen -Caffeine (EXCEDRIN MIGRAINE PO) Take by mouth. prn    [provider]  Buprenorphine HCl (BELBUCA) 150 MCG FILM Place 150 mg inside cheek at bedtime.    [provider]  buPROPion (WELLBUTRIN SR) 150 MG 12 hr tablet Take 150 mg by mouth daily. 12/25/21   [provider]  hydrochlorothiazide  (HYDRODIURIL ) 50 MG tablet Take by mouth as needed.    [provider]  hydroxypropyl methylcellulose / hypromellose (ISOPTO TEARS / GONIOVISC) 2.5 % ophthalmic solution Place 1 drop into both eyes 3 (three) times daily as needed for dry eyes.    [provider]  ketorolac   (TORADOL ) 10 MG tablet Take 10 mg by mouth as needed.    [provider]  levothyroxine  (SYNTHROID ) 175 MCG tablet Take 175 mcg by mouth daily. 02/19/21   [provider]  liothyronine  (CYTOMEL ) 5 MCG tablet Take 3 tablets (15 mcg total) by mouth daily. Take 2 tablets at breakfast and take one tablet at lunch Patient taking differently: Take 5-10 mcg by mouth See admin instructions. Take 10 mcg by mouth in the morning and 5 mcg in the evening 09/17/16   [place], MD  loratadine  (CLARITIN ) 10 MG tablet Take 10 mg by mouth daily as needed. 12/24/16   [provider]  metFORMIN  (GLUCOPHAGE ) 500 MG tablet Take 1 tablet (500 mg total) by mouth 2 (two) times daily with a meal. 03/26/22   Rayburn, Elouise Phlegm, PA-C  methocarbamol  (ROBAXIN ) 500 MG tablet Take 500 mg by mouth every 8 (eight) hours as needed for muscle spasms.    [provider]  NONFORMULARY OR COMPOUNDED ITEM Estradiol  cream 0.02% in 1ml prefilled syringes.  1ml pv twice weekly.  #6month supply. Patient taking differently: Estradiol  cream 0.02% in 1ml prefilled syringes.  1ml pv twice weekly.  #74month supply. 09/29/18   Cleotilde Ronal RAMAN, MD  omeprazole (PRILOSEC) 40 MG capsule Take 40 mg by mouth daily.    [provider]  Oxycodone  HCl 10 MG TABS SMARTSIG:1 Tablet(s) By Mouth 6 Times Daily PRN 12/25/21   [provider]  pantoprazole  (PROTONIX ) 40 MG tablet Take 40 mg by mouth daily.  [provider]  polyethylene glycol (MIRALAX  / GLYCOLAX ) 17 g packet Take 17 g by mouth daily.    [provider]  Sennosides (SENOKOT EXTRA STRENGTH) 17.2 MG TABS Take 17.2 mg by mouth daily.    [provider]  SUMAtriptan (IMITREX) 25 MG tablet Take 25 mg by mouth at bedtime as needed for migraine. 08/09/18   [provider]  Testosterone 10 MG/ACT (2%) GEL Apply 1 Pump topically at bedtime. 09/06/18   [provider]  topiramate  (TOPAMAX ) 50 MG tablet Take 1  tablet (50 mg total) by mouth daily. 03/26/22   Rayburn, Elouise Phlegm, PA-C  venlafaxine  XR (EFFEXOR -XR) 75 MG 24 hr capsule Take 75 mg by mouth daily with breakfast.    [provider]  Vitamin D , Ergocalciferol , (DRISDOL ) 1.25 MG (50000 UNIT) CAPS capsule TAKE ONE CAPSULE EVERY 7 DAYS 03/26/22   Rayburn, Elouise Phlegm, PA-C      Allergies    Oxycodone -acetaminophen , Codeine, Duloxetine , Paroxetine hcl, Duloxetine  hcl, and Percocet [oxycodone -acetaminophen ]    Review of Systems   Review of Systems  Constitutional:  Negative for chills, fatigue and fever.  HENT:  Negative for congestion.   Eyes:  Negative for photophobia.  Respiratory:  Negative for cough, chest tightness, shortness of breath and wheezing.   Cardiovascular:  Negative for chest pain and syncope.  Gastrointestinal:  Positive for nausea (from the pain). Negative for abdominal pain, bowel incontinence, constipation, diarrhea and vomiting.  Genitourinary:  Negative for bladder incontinence, dysuria and flank pain.  Musculoskeletal:  Positive for neck pain. Negative for back pain and neck stiffness.  Skin:  Negative for rash and wound.  Neurological:  Positive for tingling. Negative for dizziness, tremors, facial asymmetry, speech difficulty, weakness, light-headedness, numbness and headaches.  Psychiatric/Behavioral:  Negative for agitation and confusion.   All other systems reviewed and are negative.   Physical Exam Updated Vital Signs BP 104/89   Pulse 75   Temp 98.7 F (37.1 C) (Oral)   Resp 16   Ht 5' 8 (1.727 m)   Wt 126 kg   LMP 10/26/2015 (Exact Date)   SpO2 96%   BMI 42.24 kg/m  Physical Exam Vitals and nursing note reviewed.  Constitutional:      General: She is not in acute distress.    Appearance: She is well-developed. She is not ill-appearing, toxic-appearing or diaphoretic.  HENT:     Head: Normocephalic and atraumatic.     Nose: No congestion or rhinorrhea.     Mouth/Throat:      Mouth: Mucous membranes are moist.     Pharynx: No oropharyngeal exudate or posterior oropharyngeal erythema.  Eyes:     Extraocular Movements: Extraocular movements intact.     Conjunctiva/sclera: Conjunctivae normal.  Neck:   Cardiovascular:     Rate and Rhythm: Normal rate and regular rhythm.     Heart sounds: No murmur heard. Pulmonary:     Effort: Pulmonary effort is normal. No respiratory distress.     Breath sounds: Normal breath sounds. No rales.  Chest:     Chest wall: No tenderness.  Abdominal:     Palpations: Abdomen is soft.     Tenderness: There is no abdominal tenderness. There is no guarding or rebound.  Musculoskeletal:        General: Tenderness present. No swelling.     Cervical back: Neck supple. Tenderness present. No erythema or signs of trauma. Muscular tenderness present. No spinous process tenderness.  Skin:    General: Skin  is warm and dry.     Capillary Refill: Capillary refill takes less than 2 seconds.     Findings: No erythema or rash.  Neurological:     General: No focal deficit present.     Mental Status: She is alert.     Sensory: No sensory deficit.     Motor: No weakness.  Psychiatric:        Mood and Affect: Mood normal.     ED Results / Procedures / Treatments   Labs (all labs ordered are listed, but only abnormal results are displayed) Labs Reviewed  COMPREHENSIVE METABOLIC PANEL - Abnormal; Notable for the following components:      Result Value   Glucose, Bld 112 (*)    AST 14 (*)    All other components within normal limits  CBC WITH DIFFERENTIAL/PLATELET  MAGNESIUM    EKG None  Radiology CT Cervical Spine Wo Contrast Result Date: 01/24/2023 CLINICAL DATA:  Neck pain, acute, prior cervical surgery; Headache, sudden, severe EXAM: CT HEAD WITHOUT CONTRAST CT CERVICAL SPINE WITHOUT CONTRAST TECHNIQUE: Multidetector CT imaging of the head and cervical spine was performed following the standard protocol without intravenous  contrast. Multiplanar CT image reconstructions of the cervical spine were also generated. RADIATION DOSE REDUCTION: This exam was performed according to the departmental dose-optimization program which includes automated exposure control, adjustment of the mA and/or kV according to patient size and/or use of iterative reconstruction technique. COMPARISON:  MRI head March 13, 2022. FINDINGS: CT HEAD FINDINGS Brain: No evidence of acute infarction, hemorrhage, hydrocephalus, extra-axial collection or mass lesion/mass effect. Vascular: No hyperdense vessel. Skull: No acute fracture.  Prior left craniotomy. Sinuses/Orbits: Left maxillary sinus mucosal thickening. No acute orbital findings. Other: No mastoid effusions. CT CERVICAL SPINE FINDINGS Alignment: No substantial sagittal subluxation. Skull base and vertebrae: C5-C7 ACDF. No evidence of acute fracture. Soft tissues and spinal canal: No prevertebral fluid or swelling. No visible canal hematoma. Disc levels:  Mild bony degenerative change. Upper chest: Visualized lung apices are clear. IMPRESSION: No evidence of acute abnormality intracranially or in the cervical spine. Electronically Signed   By: Gilmore GORMAN Molt M.D.   On: 01/24/2023 21:34   CT Head Wo Contrast Result Date: 01/24/2023 CLINICAL DATA:  Neck pain, acute, prior cervical surgery; Headache, sudden, severe EXAM: CT HEAD WITHOUT CONTRAST CT CERVICAL SPINE WITHOUT CONTRAST TECHNIQUE: Multidetector CT imaging of the head and cervical spine was performed following the standard protocol without intravenous contrast. Multiplanar CT image reconstructions of the cervical spine were also generated. RADIATION DOSE REDUCTION: This exam was performed according to the departmental dose-optimization program which includes automated exposure control, adjustment of the mA and/or kV according to patient size and/or use of iterative reconstruction technique. COMPARISON:  MRI head March 13, 2022. FINDINGS: CT  HEAD FINDINGS Brain: No evidence of acute infarction, hemorrhage, hydrocephalus, extra-axial collection or mass lesion/mass effect. Vascular: No hyperdense vessel. Skull: No acute fracture.  Prior left craniotomy. Sinuses/Orbits: Left maxillary sinus mucosal thickening. No acute orbital findings. Other: No mastoid effusions. CT CERVICAL SPINE FINDINGS Alignment: No substantial sagittal subluxation. Skull base and vertebrae: C5-C7 ACDF. No evidence of acute fracture. Soft tissues and spinal canal: No prevertebral fluid or swelling. No visible canal hematoma. Disc levels:  Mild bony degenerative change. Upper chest: Visualized lung apices are clear. IMPRESSION: No evidence of acute abnormality intracranially or in the cervical spine. Electronically Signed   By: Gilmore GORMAN Molt M.D.   On: 01/24/2023 21:34    Procedures Procedures  Medications Ordered in ED Medications  HYDROmorphone  (DILAUDID ) injection 1 mg (1 mg Intravenous Given 01/24/23 2054)  HYDROmorphone  (DILAUDID ) injection 1 mg (1 mg Intravenous Given 01/24/23 2208)  cyclobenzaprine  (FLEXERIL ) tablet 10 mg (10 mg Oral Given 01/24/23 2334)    ED Course/ Medical Decision Making/ A&P Clinical Course as of 01/24/23 2335  Sat Jan 24, 2023  2205 CT Cervical Spine Wo Contrast [CT]    Clinical Course User Index [CT] Jamarrion Budai, Lonni PARAS, MD                                 Medical Decision Making Amount and/or Complexity of Data Reviewed Labs: ordered. Radiology: ordered. Decision-making details documented in ED Course.  Risk Prescription drug management.    Sally [name] is a 48 y.o. female with a past medical history significant for anxiety, depression, migraines, hypertension, hypothyroidism, GERD, and previous neck surgery with chronic neck pain managed by pain management who presents with worsened neck pain, headache, facial tingling, and shoulder pain.  According to patient, since Wednesday, she has had escalation of her  pain in her neck that she has chronically.  She reports that it feels similar but is much more severe.  She reports her pain is normally a 2 out of 10 and managed by home oxycodone  and other medications.  She says that since Wednesday, it has been more severe up to an 8 out of 10 severity in the left neck going towards the head and into the left shoulder and upper arm.  She reports no numbness or weakness in the arms or legs and denies any vision changes or speech changes.  She reports some mild tingling of the left face that waxes and wanes with the pain and mild tingling on the top of her head as well.  She denies any bowel or bladder loss of control and no numbness or weakness in the legs.  No new falls or injuries and denies any chiropractor use neck massages or neck trauma.  She has had surgery in her neck before and she says she is scheduled to have evaluation for possible spinal stimulator for her chronic pains.  Otherwise, patient denies any fevers, chills, congestion, cough, vomiting, constipation, diarrhea, or urinary changes.  She does report some nausea with the pain is severe.  On exam, lungs clear.  Chest nontender.  Abdomen nontender.  Back had some muscle spasm in the left neck and left scapular area going towards left shoulder.  She did not have midline bony tenderness on my exam and I do not appreciate crepitance.  Head was not tender otherwise.  No focal neurologic deficits in regards to send sensation and strength although she did have a very mild Hoffmann sign in the left arm which is where she is not hurting at this time.  Symmetric smile, clear speech.  Pupils symmetric and reactive with normal extraocular movements.  No rash seen on the neck or face to suggest shingles.  Clinically I suspect patient has muscle spasm and musculoskeletal pain exacerbating her chronic pains.  As she has not had imaging in a while and she felt some crunching feeling, we will get a CT of her head and neck.   As there was no focal trauma I have low suspicion for dissection at this time.  She did not have any focal neurologic deficits in her left arm and we agree that as we have no MRI  here.  We did not feel she needed emergent transfer for MRI tonight.  Low suspicion for meningitis given her lack of infectious symptoms and low suspicion for acute stroke at this time.  Will get the CT head and CT C-spine and give her a dose of pain medicine.  I discussed that given the spasm and soreness on exam I suspect that if workup reassuring, we will discharge with prescription for Lidoderm  patches and muscle relaxant and have her follow-up with her primary neck team.  Patient agrees with this plan.  Anticipate reassessment after workup.  CT scan showed no hardware problem or fracture or dislocation.  No skull fracture or bleeding.  No other acute abnormality seen.  Labs overall reassuring.  Patient feeling better after meds.  Suspect muscle spasm and escalation of her chronic pain.  Given her lack of persistent neurologic deficits, we had a shared decision-making conversation and agreed to not transfer for MRI tonight but instead of her follow-up with her outpatient neck and pain teams for further management.  Patient agrees.  Will have her stop her methocarbamol  and switch to Flexeril  to see if this helps with the spasms we felt on exam and use Lidoderm  patches.  She agrees with plan of care.  Patient discharged in good condition for outpatient follow-up.         Final Clinical Impression(s) / ED Diagnoses Final diagnoses:  Muscle spasms of neck  Neck pain  Tingling    Rx / DC Orders ED Discharge Orders          Ordered    cyclobenzaprine  (FLEXERIL ) 10 MG tablet  2 times daily PRN        01/24/23 2330    lidocaine  (LIDODERM ) 5 %  Every 24 hours        01/24/23 2330           Clinical Impression: 1. Muscle spasms of neck   2. Neck pain   3. Tingling     Disposition:  Discharge  Condition: Good  I have discussed the results, Dx and Tx plan with the pt(& family if present). He/she/they expressed understanding and agree(s) with the plan. Discharge instructions discussed at great length. Strict return precautions discussed and pt &/or family have verbalized understanding of the instructions. No further questions at time of discharge.    New Prescriptions   CYCLOBENZAPRINE  (FLEXERIL ) 10 MG TABLET    Take 1 tablet (10 mg total) by mouth 2 (two) times daily as needed for muscle spasms.   LIDOCAINE  (LIDODERM ) 5 %    Place 1 patch onto the skin daily. Remove & Discard patch within 12 hours or as directed by MD    Follow Up: Leron Millman, NP [address] [place] [address] [phone]     your neck doctor and pain team        Teresita Fanton, Lonni PARAS, MD 01/24/23 [phone]

## 2023-01-24 NOTE — ED Triage Notes (Signed)
 Pt states horrible posterior neck pain with pain to face and shoulder on left that started yesterday  States face also feels tingling   H/o spinal fusion and having trial next week  Goes to pain management

## 2023-04-02 ENCOUNTER — Encounter: Payer: No Typology Code available for payment source | Attending: Psychology | Admitting: Psychology

## 2023-04-02 DIAGNOSIS — M5010 Cervical disc disorder with radiculopathy, unspecified cervical region: Secondary | ICD-10-CM | POA: Diagnosis not present

## 2023-04-02 DIAGNOSIS — F419 Anxiety disorder, unspecified: Secondary | ICD-10-CM | POA: Insufficient documentation

## 2023-04-02 DIAGNOSIS — G894 Chronic pain syndrome: Secondary | ICD-10-CM | POA: Diagnosis present

## 2023-04-02 DIAGNOSIS — R4189 Other symptoms and signs involving cognitive functions and awareness: Secondary | ICD-10-CM | POA: Insufficient documentation

## 2023-04-02 DIAGNOSIS — G90513 Complex regional pain syndrome I of upper limb, bilateral: Secondary | ICD-10-CM | POA: Insufficient documentation

## 2023-04-02 DIAGNOSIS — F32A Depression, unspecified: Secondary | ICD-10-CM | POA: Diagnosis present

## 2023-05-05 ENCOUNTER — Encounter: Payer: Self-pay | Admitting: Psychology

## 2023-05-05 ENCOUNTER — Encounter: Payer: No Typology Code available for payment source | Attending: Psychology | Admitting: Psychology

## 2023-05-05 DIAGNOSIS — G90513 Complex regional pain syndrome I of upper limb, bilateral: Secondary | ICD-10-CM | POA: Diagnosis not present

## 2023-05-05 DIAGNOSIS — F419 Anxiety disorder, unspecified: Secondary | ICD-10-CM | POA: Diagnosis present

## 2023-05-05 DIAGNOSIS — F32A Depression, unspecified: Secondary | ICD-10-CM | POA: Diagnosis present

## 2023-05-05 DIAGNOSIS — R4189 Other symptoms and signs involving cognitive functions and awareness: Secondary | ICD-10-CM | POA: Diagnosis not present

## 2023-05-05 DIAGNOSIS — M5010 Cervical disc disorder with radiculopathy, unspecified cervical region: Secondary | ICD-10-CM | POA: Diagnosis not present

## 2023-05-05 NOTE — Progress Notes (Signed)
 Patient:  Sally [name]   DOB: 10-02-1975  MR Number: [id number]  Location: [name] [address], [address] [place] [address] Dept: [phone]  Start: 9 AM End: 10 AM  .  Today's visit was an in person visit conducted in outpatient clinic office with the patient myself present.   Provider/Observer:     Hershal Coria PsyD  Chief Complaint:      Chief Complaint  Patient presents with   Depression   Pain   Stress   Anxiety   Sleeping Problem    Reason For Service:     Sally [name] is a 48 year old female referred by [facility] for therapeutic interventions due to chronic pain symptoms.  The patient reports that she has great difficulty standing, walking for long times, elbow pain, migraines, hip pain, shoulder and neck pain.  The patient fell at work and fractured both her elbows and also had knee replacement surgery.  The patient has been taking maintenance doses of opioid pain medications for some time.  The patient reports that she has a great deal of difficulty functioning and has very poor sleep and wakes up numerous times each night.  The patient also has dealt with depression and anxiety for many years and has been hospitalized for depression anxiety years ago.  She has been receiving counseling for anxiety and depression and takes Trintellix.   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   The patient describes chronic issues of depression anxiety but the development of severe chronic pain.  The patient reports that she is continuing to have some  pain in her back post surgery and had a recent fall.  Follow-up MRI did not suggest any structural damage or injury from this fall.  The patient reports that her symptoms associated with complex regional pain syndrome have worsened recently.  11/01/2019: The patient is continued to have significant pain and distress with worsening episodes of her complex regional pain syndrome and fibromyalgia type symptoms.  The patient has been very frustrated at times with her lack of ability to function and do daily activities.  01/03/2020: The patient reports that she is continued to have significant pain but has particularly had worsening of her fatigue and somnolence.  She reports that she feels like she is sleeping an adequate amount of time but is very tired and fatigued throughout the day.  02/28/2020: The patient reports that she has had some worsening in her significant pain symptoms with cold and atmospheric changes.  She continues to have significant issues with her upper extremities related to complex regional pain syndrome and fibromyalgia.  The patient also reports that she has had significant psychosocial stressors with her closest friend having terminal cancer and being given 3 months to live.  The patient and her friend are talking about the patient taking over care of the patient's granddaughter and the patient also feels a great deal of need to do what ever she can for her friend going forward.  This is a very difficult and stressful situation for the patient on top of her numerous medical  and pain issues.  The patient reports that her sleep continues to be improved but limited with pain disrupting sleep patterns.  The patient has been followed by Dr. Marilynne Shutter MD [facility]) for some time for pain management and she is now been referred for an evaluation for spinal cord stimulator trialing and possible implantation as well as being scheduled for ketamine infusions.  They are still working on  Event organiser. coverage for the ketamine infusions.  I think that the patient is an excellent candidate for both of these possible options.  The patient and I talked about the appropriateness for ketamine infusions due to her wide spectrum of issues for some time now and I am quite pleased to find out that her treating pain specialist's clinic is now doing ketamine infusions that she appears to be an excellent candidate.  She also appears to be an excellent candidate for spinal cord stimulator trialing and possible implantation.  03/27/2020: The patient reports that she has gone through all of the preliminary work-up to have a spinal cord stimulator trial conducted and is looking forward with hope that it will help with her significant cervical related pain.  The patient is also has an initial appointment to assess for possible ketamine infusion therapies as well.  We reviewed issues related to both the spinal cord stimulator and ketamine infusion with the patient as well as worked on therapeutic interventions around her chronic pain/complex regional pain syndrome diagnoses.  The patient reports that she still has significant psychosocial stressors with her closest friend dying from breast cancer and in the last month or 2 of her life.  At this point, the patient does not look like she will have to take over her friend's granddaughters care as the family is hoping that an uncle of the young girl will take over care.  04/24/2020: The patient returns reporting that she has gotten her spinal cord stimulator trialing device and has it implanted currently.  However, there have been mechanical problems with the device and it has been shutting off independently and the patient has not been able to get a good reading on how much it has helped her.  The patient reports that she did experience some brief improvement when she knew that it was on but it would turn off.  She is gone back to the device maker and  they could not figure out the problem.  They are going to put a new external device in and extend the spinal cord stimulator trial in face for 2 days.  The patient has also had her first ketamine infusion trial.  She reports that she was told she was hallucinating and dissociative during this phase and experienced some improvement over the rest of the day as far as her mood and pain symptoms but it was over within 24 hours.  The patient is scheduled to have her next ketamine infusion coming up.  08/07/2020: The patient reports that they are looking at doing a new spinal cord stimulator trial as the device did not work properly during her 7-day trial.  In fact, they attempted to extended to 10 days hoping to get it working correctly.  However, the patient reports that she felt like it did help the first day when it was apparently working but shortly stopped working and it sounds like there were never able to get it working properly.  The patient reports that at this point, her anesthesiologist/pain specialist does not think that she  should have any more ketamine trials but the patient would like to try them again.  The patient reports that the doctor felt that she had an adverse reaction to it but her husband was present and did not describe any severe reaction.  At this point, she is planning to talk to him again about possibly having another ketamine infusion if possible.  The patient reports that she continues with her significant chronic pain symptoms but she is managing day-to-day.  She reports that there continues to be a major stressor in her life relative to a very close friend who has terminal cancer and the cancer is continuing to progress.  09/11/2020: The patient reports that her friend is now being managed by hospice and appears to be at the end of her life.  This has been stressful but the patient has been able to adjust to the impending reality with time and she feels like she is handling it fairly  well.  The patient reports that she continues to have significant pain in her arms radiating from her neck.  The patient reports that at this point she has not been able to return for ketamine therapies and not discussed strategies to follow-up with [facility].  11/06/2020: The patient has continued to have a lot of stress with depression and anxiety worsening particular around the impending death of a very close friend.  The friend is dying from metastatic cancer and the patient is one of the primary helpers for her friend.  The patient is very close to this friend.  The patient interacts with hospice and is spending a lot of time helping.  This is been coinciding with a worsening of her cervical pain radiating down her right arm primarily.  She continues to struggle with severe pain and the depression and anxiety associated around all of these issues has been worse.  12/11/2020: The patient reports that she has had a very stressful time recently.  Her close friend has passed away and while her friend passed away peacefully and was in the care of hospice at the end of her life there is [employer] "shit show" happening around her friend's death.  The patient reports that the friends son who has been very active in the friend's life tragically died just a couple of days after the friend's death in a motor vehicle accident.  The son who had had little interaction with her friend did show up and there was conflict between the friend's new husband and the son regarding ownership of some of the property.  The friend had completed an extensive well that gave all of her estate to the grandchildren but there was still a great deal of arguing.  The husband had lifetime rights to the house for now.  The patient reports that the death of her friend's son happened before the funeral and so they ended up having a joint funeral for the son and the patient's friend.  The patient reports that her depression has been  very severe and the fact that she is the executor for her friend's estate has had a great deal of stress.  Her pain continues to be very severe.  05/02/2021:  Patient continues to struggle with pain and has not returned back to [facility] yet for any interventions.  They are still working on plan.  Patient still with stress over friends death and the fall out over friends grandchild etc.  Still a very complicated situation.  08/05/2021: The patient  reports that she has had a lot on her plate since I saw her last in April.  The patient reports that she now has her friend's great-granddaughter living permanently with her and is becoming part of her family.  This young lady does have a lot of behavioral issues and it is a challenge for the patient to manage some of the issues.  However, she feels a reward for being able to provide a stable home and follow the wishes of her friend after her friend passed away from cancer.  15-Nov-2021: The patient reports that she has continued to struggle with her chronic pain symptoms but reports that things have been stable as far as overall psychosocial issues and things are going well with her adopted daughter.  The patient reports that there have been ongoing issues with depression and anxiety.  Today we worked on coping and adjustment issues going forward and strategies to manage the symptoms.  12/26/2021: The patient returns today after 9-month break.  The patient continues to struggle with chronic pain symptoms.  The patient has not returned to see pain management group recently and [facility].  The patient reports that this has helped some but she continues with her significant pain.  The patient has been approved for ketamine therapies through [facility] but after 1 trial they felt she was not a good candidate although the patient wanted to continue with these efforts.  I have referred the patient to Dr. Alessandra Ancona here in our office for review  about the appropriateness of sublingual ketamine treatment as the patient has verbalized willingness to continue with therapy and this strategy but a different type of very small doses delivered sublingually.  The patient is hoping that this will also help with her depression and other symptoms that go along with her pain.  I will sit down with Dr. Alessandra Ancona and reviewed this prior to the patient's visit that is scheduled on 01/14/2022.  04/29/2022: The patient returns and reports that she has really been struggling with chronic pain lately and has a particularly bad week so far with regard to pain and fatigue.  Patient saw neurology regarding her memory difficulties and impressions from that were consistent with my impression is that her memory difficulties are likely resulting from her chronic pain, depression and anxiety stress and other functional issues rather than a degenerative neurological condition.  She was assessed for possible multiple sclerosis and thus far all examinations around that possibility including ANA test and MRI were not indicative of MS.  Patient continues to have a lot of stress and is exacerbated by her complex regional pain symptoms etc.  Patient reports that she has not started back on the small sublingual ketamine doses but could not remember exactly why this was stopped.  She thinks it was around the time when she had significant illness with COVID.  The patient reports that Dr. Alessandra Ancona had wanted her to try a different medicine but does not remember what that medicine was.  I will address this issue with Dr. Alessandra Ancona.  06/25/2022: The patient comes in today reporting that her recent injections Dr. Alessandra Ancona provided were quite helpful and she has had less pain in her shoulder and neck region.  The patient reports that she hopes for it to last for a month or more.  Patient reports that some of the stressors that have been happening at home have significantly reduced.  She and her husband  continue to struggle with her  relationship but they are trying to work on aspects of their relationship and at this point she is focusing on trying to see if they can manage their relationship rather than separate and she has noted that he has been making efforts as well.  The relationship between her older sons and her husband continues to be strained but her husband is making efforts to try to address some of the conflicts.  This is developed some complications with her son and his wife expected to move into their house for period of time while they save money to buy their own house.  She is apprehensive about how that will play out.  12/15/2022: The patient comes in reporting that there have been a lot of psychosocial stressors with her and her husband as they are now separated and the patient's husband living outside of the house.  One of the big major issues had to do with her oldest son and some of the challenges that the son poses particularly for her husband.  This son is now planning to move in with his girlfriend into her house which is also causing a great deal of stress for the patient.  We addressed this issue a great deal today and continues to be problematic for her.  The patient continues with her significant pain symptoms particularly shoulder issues are playing a big impact on her overall pain now.  Patient is being considered for shoulder surgery but she is concerned how the potential surgery could impact her regional pain syndrome.  The patient is also being considered for new trial of the spinal cord stimulator.  05/05/2023: The patient reports that there continues to be some psychosocial stressors going on.  She and her husband were back to living in the same house again until very recently when a disagreement developed and he moved back out.  There have been some improvements overall in psychosocial stressors.  The patient notes that she has been having increasing problems with somnolence  etc.  The issue of obstructive sleep apnea has been raised again and the patient is planning on having annual workup.  The patient had been previously diagnosed with obstructive sleep apnea but did not tolerate the mask but this was years ago.  I stressed again the need for this to be treated if it is present as it can be playing a role in exacerbating her pain, increasing depression and anxiety symptoms and cognitive difficulties.  The patient reported that she will follow up on getting it scheduled for a sleep study.  We talked about the need for acclimating to a CPAP machine and what that entails and how to work towards success if the patient's previous diagnosis is confirmed again.  Interventions Strategy:  Cognitive/behavioral therapeutic interventions along with coping skills and strategies around chronic pain, sleep disturbance and depression/anxiety.  Participation Level:   Active  Participation Quality:  Appropriate and Attentive      Behavioral Observation:  Well Groomed, Alert, and Appropriate.   Current Psychosocial Factors: Patient is still looking at having friend's grand daughter come live with patient and will be starting soon.   Content of Session:   Reviewed current symptoms and changes she is experienced since her cervical surgery and pain she experiences radiating down her arms.  We also reviewed strategies for restarting ketamine therapies but with a different strategy including sublingual ketamine.   Current Status:   The patient is hoping that they are able to do another spinal cord stimulator trial  as she felt like it was helpful the first day before the device stopped working.  Patient Progress:   The patient reports that her overall sleep has improved from initial status but continues to be problematic.  The patient reports that her vein plays a big role in her difficulty sleeping.  Depression anxiety continue to be quite problematic for the  patient.  Impression/Diagnosis:   Addilee Neu is a 48 year old female referred by [facility] for therapeutic interventions due to chronic pain symptoms.  The patient reports that she has great difficulty standing, walking for long times, elbow pain, migraines, hip pain, shoulder and neck pain.  The patient fell at work and fractured both her elbows and also had knee replacement surgery.  The patient has been taking maintenance doses of opioid pain medications for some time.  The patient reports that she has a great deal of difficulty functioning and has very poor sleep and wakes up numerous times each night.  The patient also has dealt with depression and anxiety for many years and has been hospitalized for depression anxiety years ago.  She has been receiving counseling for anxiety and depression and takes Trintellix.   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   The patient describes chronic issues of depression anxiety but the development of severe chronic pain  After a fall at work in 2015 where she fractured both of her elbows, injured her knee and developed fibromyalgia/regional pain syndrome.  The patient reports that she has ongoing difficulties walking distances or standing.  She describes elbow pain, trouble lifting things and trouble writing for any period of time.  The patient describes significant sleep disturbance and is unable to fall asleep at night.  She reports that she is always tired.  The patient describes a normal appetite.  The patient describes memory issues related to trouble remembering things and concentration issues.  The patient reports that she is significantly  less active with her children and husband and that her husband has to help more due to her significant pain.  We have continue to work on issues related to her pain and sleep status along with issues of depression and anxiety.  02/28/2020: The patient reports that she has had some worsening in her significant pain symptoms with cold and atmospheric changes.  She continues to have significant issues with her upper extremities related to complex regional pain syndrome and fibromyalgia.  The patient also reports that she has had significant psychosocial stressors with her closest friend having terminal cancer and being given 3 months to live.  The patient and her friend are talking about the patient taking over care of the patient's granddaughter and the patient also feels a great deal of need to do what ever she can for her friend going forward.  This is a very difficult and stressful situation for the patient on top of her numerous medical and pain issues.  The patient reports that her sleep continues to be improved but limited with pain disrupting sleep patterns.  The patient has been followed by Dr. Myna Hidalgo MD [facility]) for some time for pain management and she is now been referred for an  evaluation for spinal cord stimulator trialing and possible implantation as well as being scheduled for ketamine infusions.  They are still working on Event organiser. coverage for the ketamine infusions.  I think that the patient is an excellent candidate for both of these possible options.  The patient and I talked about the appropriateness for ketamine infusions due to her wide spectrum of issues for some time now and I am quite pleased to find out that her treating pain specialist's clinic is now doing ketamine infusions that she appears to be an excellent candidate.  She also appears to be an excellent candidate for spinal cord stimulator trialing and possible implantation.  04/24/2020: The patient  returns reporting that she has gotten her spinal cord stimulator trialing device and has it implanted currently.  However, there have been mechanical problems with the device and it has been shutting off independently and the patient has not been able to get a good reading on how much it has helped her.  The patient reports that she did experience some brief improvement when she knew that it was on but it would turn off.  She is gone back to the device maker and they could not figure out the problem.  They are going to put a new external device in and extend the spinal cord stimulator trial in face for 2 days.  The patient has also had her first ketamine infusion trial.  She reports that she was told she was hallucinating and dissociative during this phase and experienced some improvement over the rest of the day as far as her mood and pain symptoms but it was over within 24 hours.  The patient is scheduled to have her next ketamine infusion coming up.  08/07/2020: The patient reports that they are looking at doing a new spinal cord stimulator trial as the device did not work properly during her 7-day trial.  In fact, they attempted to extended to 10 days hoping to get it working correctly.  However, the patient reports that she felt like it did help the first day when it was apparently working but shortly stopped working and it sounds like there were never able to get it working properly.  The patient reports that at this point, her anesthesiologist/pain specialist does not think that she should have any more ketamine trials but the patient would like to try them again.  The patient reports that the doctor felt that she had an adverse reaction to it but her husband was present and did not describe any severe reaction.  At this point, she is planning to talk to him again about possibly having another ketamine infusion if possible.  The patient reports that she continues with her significant chronic pain symptoms  but she is managing day-to-day.  She reports that there continues to be a major stressor in her life relative to a very close friend who has terminal cancer and the cancer is continuing to progress.  09/11/2020: The patient reports that her friend is now being managed by hospice and appears to be at the end of her life.  This has been stressful but the patient has been able to adjust to the impending reality with time and she feels like she is handling it fairly well.  The patient reports that she continues to have significant pain in her arms radiating from her neck.  The patient reports that at this point she has not been able to return for ketamine therapies and  not discussed strategies to follow-up with [facility].  11/06/2020: The patient has continued to have a lot of stress with depression and anxiety worsening particular around the impending death of a very close friend.  The friend is dying from metastatic cancer and the patient is one of the primary helpers for her friend.  The patient is very close to this friend.  The patient interacts with hospice and is spending a lot of time helping.  This is been coinciding with a worsening of her cervical pain radiating down her right arm primarily.  She continues to struggle with severe pain and the depression and anxiety associated around all of these issues has been worse.  12/11/2020: The patient reports that she has had a very stressful time recently.  Her close friend has passed away and while her friend passed away peacefully and was in the care of hospice at the end of her life there is [employer] "shit show" happening around her friend's death.  The patient reports that the friends son who has been very active in the friend's life tragically died just a couple of days after the friend's death in a motor vehicle accident.  The son who had had little interaction with her friend did show up and there was conflict between the friend's new  husband and the son regarding ownership of some of the property.  The friend had completed an extensive well that gave all of her estate to the grandchildren but there was still a great deal of arguing.  The husband had lifetime rights to the house for now.  The patient reports that the death of her friend's son happened before the funeral and so they ended up having a joint funeral for the son and the patient's friend.  The patient reports that her depression has been very severe and the fact that she is the executor for her friend's estate has had a great deal of stress.  Her pain continues to be very severe.  Today we continue to work on therapeutic interventions for coping and adjustment in dealing with significant depression and recent exacerbations and stress.  05/02/2021:  Patient continues to struggle with pain and has not returned back to [facility] yet for any interventions.  They are still working on plan.  Patient still with stress over friends death and the fall out over friends grandchild etc.  Still a very complicated situation  10/22/2021: The patient reports that she has continued to struggle with her chronic pain symptoms but reports that things have been stable as far as overall psychosocial issues and things are going well with her adopted daughter.  The patient reports that there have been ongoing issues with depression and anxiety.  Today we worked on coping and adjustment issues going forward and strategies to manage the symptoms.  12/26/2021: The patient returns today after 51-month break.  The patient continues to struggle with chronic pain symptoms.  The patient has not returned to see pain management group recently and [facility].  The patient reports that this has helped some but she continues with her significant pain.  The patient has been approved for ketamine therapies through [facility] but after 1 trial they felt she was not a good candidate although  the patient wanted to continue with these efforts.  I have referred the patient to Dr. Carlis Abbott here in our office for review about the appropriateness of sublingual ketamine treatment as the patient has verbalized willingness  to continue with therapy and this strategy but a different type of very small doses delivered sublingually.  The patient is hoping that this will also help with her depression and other symptoms that go along with her pain.  I will sit down with Dr. Alessandra Ancona and reviewed this prior to the patient's visit that is scheduled on 01/14/2022.  04/29/2022: The patient returns and reports that she has really been struggling with chronic pain lately and has a particularly bad week so far with regard to pain and fatigue.  Patient saw neurology regarding her memory difficulties and impressions from that were consistent with my impression is that her memory difficulties are likely resulting from her chronic pain, depression and anxiety stress and other functional issues rather than a degenerative neurological condition.  She was assessed for possible multiple sclerosis and thus far all examinations around that possibility including ANA test and MRI were not indicative of MS.  Patient continues to have a lot of stress and is exacerbated by her complex regional pain symptoms etc.  Patient reports that she has not started back on the small sublingual ketamine doses but could not remember exactly why this was stopped.  She thinks it was around the time when she had significant illness with COVID.  The patient reports that Dr. Alessandra Ancona had wanted her to try a different medicine but does not remember what that medicine was.  I will address this issue with Dr. Alessandra Ancona.  06/25/2022: The patient comes in today reporting that her recent injections Dr. Alessandra Ancona provided were quite helpful and she has had less pain in her shoulder and neck region.  The patient reports that she hopes for it to last for a month or more.   Patient reports that some of the stressors that have been happening at home have significantly reduced.  She and her husband continue to struggle with her relationship but they are trying to work on aspects of their relationship and at this point she is focusing on trying to see if they can manage their relationship rather than separate and she has noted that he has been making efforts as well.  The relationship between her older sons and her husband continues to be strained but her husband is making efforts to try to address some of the conflicts.  This is developed some complications with her son and his wife expected to move into their house for period of time while they save money to buy their own house.  She is apprehensive about how that will play out.  12/15/2022: The patient comes in reporting that there have been a lot of psychosocial stressors with her and her husband as they are now separated and the patient's husband living outside of the house.  One of the big major issues had to do with her oldest son and some of the challenges that the son poses particularly for her husband.  This son is now planning to move in with his girlfriend into her house which is also causing a great deal of stress for the patient.  We addressed this issue a great deal today and continues to be problematic for her.  The patient continues with her significant pain symptoms particularly shoulder issues are playing a big impact on her overall pain now.  Patient is being considered for shoulder surgery but she is concerned how the potential surgery could impact her regional pain syndrome.  The patient is also being considered for new trial of the spinal cord  stimulator.  05/05/2023: The patient reports that there continues to be some psychosocial stressors going on.  She and her husband were back to living in the same house again until very recently when a disagreement developed and he moved back out.  There have been some  improvements overall in psychosocial stressors.  The patient notes that she has been having increasing problems with somnolence etc.  The issue of obstructive sleep apnea has been raised again and the patient is planning on having annual workup.  The patient had been previously diagnosed with obstructive sleep apnea but did not tolerate the mask but this was years ago.  I stressed again the need for this to be treated if it is present as it can be playing a role in exacerbating her pain, increasing depression and anxiety symptoms and cognitive difficulties.  The patient reported that she will follow up on getting it scheduled for a sleep study.  We talked about the need for acclimating to a CPAP machine and what that entails and how to work towards success if the patient's previous diagnosis is confirmed again.  Diagnosis: Complex regional pain syndrome, fibromyalgia, anxiety and depression, neck, shoulder and back pain.

## 2023-05-07 ENCOUNTER — Encounter: Payer: Self-pay | Admitting: Psychology

## 2023-05-07 NOTE — Progress Notes (Signed)
 Patient:  Sally [name]   DOB: [birth year]  MR Number: [id number]  Location: [facility] [address], [address] [place] [address] Dept: [phone]  Start: 10 AM End: 11 AM  .  Today's visit was an in person visit conducted in outpatient clinic office with the patient myself present.   Provider/Observer:     Hershal Coria PsyD  Chief Complaint:      Chief Complaint  Patient presents with   Pain   Anxiety   Depression   Sleeping Problem   Stress    Reason For Service:     Ladajah Soltys is a 48 year old female referred by [facility] medical for therapeutic interventions due to chronic pain symptoms.  The patient reports that she has great difficulty standing, walking for long times, elbow pain, migraines, hip pain, shoulder and neck pain.  The patient fell at work and fractured both her elbows and also had knee replacement surgery.  The patient has been taking maintenance doses of opioid pain medications for some time.  The patient reports that she has a great deal of difficulty functioning and has very poor sleep and wakes up numerous times each night.  The patient also has dealt with depression and anxiety for many years and has been hospitalized for depression anxiety years ago.  She has been receiving counseling for anxiety and depression and takes Trintellix.   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   The patient describes chronic issues of depression anxiety but the development of severe chronic pain.  The patient reports that she is continuing to have  some pain in her back post surgery and had a recent fall.  Follow-up MRI did not suggest any structural damage or injury from this fall.  The patient reports that her symptoms associated with complex regional pain syndrome have worsened recently.  11/01/2019: The patient is continued to have significant pain and distress with worsening episodes of her complex regional pain syndrome and fibromyalgia type symptoms.  The patient has been very frustrated at times with her lack of ability to function and do daily activities.  01/03/2020: The patient reports that she is continued to have significant pain but has particularly had worsening of her fatigue and somnolence.  She reports that she feels like she is sleeping an adequate amount of time but is very tired and fatigued throughout the day.  02/28/2020: The patient reports that she has had some worsening in her significant pain symptoms with cold and atmospheric changes.  She continues to have significant issues with her upper extremities related to complex regional pain syndrome and fibromyalgia.  The patient also reports that she has had significant psychosocial stressors with her closest friend having terminal cancer and being given 3 months to live.  The patient and her friend are talking about the patient taking over care of the patient's granddaughter and the patient also feels a great deal of need to do what ever she can for her friend going forward.  This is a very difficult and stressful situation for the patient on top of her numerous medical  and pain issues.  The patient reports that her sleep continues to be improved but limited with pain disrupting sleep patterns.  The patient has been followed by Dr. Marilynne Shutter MD [facility]) for some time for pain management and she is now been referred for an evaluation for spinal cord stimulator trialing and possible implantation as well as being scheduled for ketamine infusions.  They are still working on  Event organiser. coverage for the ketamine infusions.  I think that the patient is an excellent candidate for both of these possible options.  The patient and I talked about the appropriateness for ketamine infusions due to her wide spectrum of issues for some time now and I am quite pleased to find out that her treating pain specialist's clinic is now doing ketamine infusions that she appears to be an excellent candidate.  She also appears to be an excellent candidate for spinal cord stimulator trialing and possible implantation.  03/27/2020: The patient reports that she has gone through all of the preliminary work-up to have a spinal cord stimulator trial conducted and is looking forward with hope that it will help with her significant cervical related pain.  The patient is also has an initial appointment to assess for possible ketamine infusion therapies as well.  We reviewed issues related to both the spinal cord stimulator and ketamine infusion with the patient as well as worked on therapeutic interventions around her chronic pain/complex regional pain syndrome diagnoses.  The patient reports that she still has significant psychosocial stressors with her closest friend dying from breast cancer and in the last month or 2 of her life.  At this point, the patient does not look like she will have to take over her friend's granddaughters care as the family is hoping that an uncle of the young girl will take over care.  04/24/2020: The patient returns reporting that she has gotten her spinal cord stimulator trialing device and has it implanted currently.  However, there have been mechanical problems with the device and it has been shutting off independently and the patient has not been able to get a good reading on how much it has helped her.  The patient reports that she did experience some brief improvement when she knew that it was on but it would turn off.  She is gone back to the device maker and  they could not figure out the problem.  They are going to put a new external device in and extend the spinal cord stimulator trial in face for 2 days.  The patient has also had her first ketamine infusion trial.  She reports that she was told she was hallucinating and dissociative during this phase and experienced some improvement over the rest of the day as far as her mood and pain symptoms but it was over within 24 hours.  The patient is scheduled to have her next ketamine infusion coming up.  08/07/2020: The patient reports that they are looking at doing a new spinal cord stimulator trial as the device did not work properly during her 7-day trial.  In fact, they attempted to extended to 10 days hoping to get it working correctly.  However, the patient reports that she felt like it did help the first day when it was apparently working but shortly stopped working and it sounds like there were never able to get it working properly.  The patient reports that at this point, her anesthesiologist/pain specialist does not think that she  should have any more ketamine trials but the patient would like to try them again.  The patient reports that the doctor felt that she had an adverse reaction to it but her husband was present and did not describe any severe reaction.  At this point, she is planning to talk to him again about possibly having another ketamine infusion if possible.  The patient reports that she continues with her significant chronic pain symptoms but she is managing day-to-day.  She reports that there continues to be a major stressor in her life relative to a very close friend who has terminal cancer and the cancer is continuing to progress.  09/11/2020: The patient reports that her friend is now being managed by hospice and appears to be at the end of her life.  This has been stressful but the patient has been able to adjust to the impending reality with time and she feels like she is handling it fairly  well.  The patient reports that she continues to have significant pain in her arms radiating from her neck.  The patient reports that at this point she has not been able to return for ketamine therapies and not discussed strategies to follow-up with  pain Institute.  11/06/2020: The patient has continued to have a lot of stress with depression and anxiety worsening particular around the impending death of a very close friend.  The friend is dying from metastatic cancer and the patient is one of the primary helpers for her friend.  The patient is very close to this friend.  The patient interacts with hospice and is spending a lot of time helping.  This is been coinciding with a worsening of her cervical pain radiating down her right arm primarily.  She continues to struggle with severe pain and the depression and anxiety associated around all of these issues has been worse.  12/11/2020: The patient reports that she has had a very stressful time recently.  Her close friend has passed away and while her friend passed away peacefully and was in the care of hospice at the end of her life there is [employer] "shit show" happening around her friend's death.  The patient reports that the friends son who has been very active in the friend's life tragically died just a couple of days after the friend's death in a motor vehicle accident.  The son who had had little interaction with her friend did show up and there was conflict between the friend's new husband and the son regarding ownership of some of the property.  The friend had completed an extensive well that gave all of her estate to the grandchildren but there was still a great deal of arguing.  The husband had lifetime rights to the house for now.  The patient reports that the death of her friend's son happened before the funeral and so they ended up having a joint funeral for the son and the patient's friend.  The patient reports that her depression has been  very severe and the fact that she is the executor for her friend's estate has had a great deal of stress.  Her pain continues to be very severe.  05/02/2021:  Patient continues to struggle with pain and has not returned back to [facility] yet for any interventions.  They are still working on plan.  Patient still with stress over friends death and the fall out over friends grandchild etc.  Still a very complicated situation.  08/05/2021: The patient  reports that she has had a lot on her plate since I saw her last in April.  The patient reports that she now has her friend's great-granddaughter living permanently with her and is becoming part of her family.  This young lady does have a lot of behavioral issues and it is a challenge for the patient to manage some of the issues.  However, she feels a reward for being able to provide a stable home and follow the wishes of her friend after her friend passed away from cancer.  10/23/2021: The patient reports that she has continued to struggle with her chronic pain symptoms but reports that things have been stable as far as overall psychosocial issues and things are going well with her adopted daughter.  The patient reports that there have been ongoing issues with depression and anxiety.  Today we worked on coping and adjustment issues going forward and strategies to manage the symptoms.  12/26/2021: The patient returns today after 44-month break.  The patient continues to struggle with chronic pain symptoms.  The patient has not returned to see pain management group recently and [facility].  The patient reports that this has helped some but she continues with her significant pain.  The patient has been approved for ketamine therapies through [facility] but after 1 trial they felt she was not a good candidate although the patient wanted to continue with these efforts.  I have referred the patient to Dr. Alessandra Ancona here in our office for review  about the appropriateness of sublingual ketamine treatment as the patient has verbalized willingness to continue with therapy and this strategy but a different type of very small doses delivered sublingually.  The patient is hoping that this will also help with her depression and other symptoms that go along with her pain.  I will sit down with Dr. Alessandra Ancona and reviewed this prior to the patient's visit that is scheduled on 01/14/2022.  04/29/2022: The patient returns and reports that she has really been struggling with chronic pain lately and has a particularly bad week so far with regard to pain and fatigue.  Patient saw neurology regarding her memory difficulties and impressions from that were consistent with my impression is that her memory difficulties are likely resulting from her chronic pain, depression and anxiety stress and other functional issues rather than a degenerative neurological condition.  She was assessed for possible multiple sclerosis and thus far all examinations around that possibility including ANA test and MRI were not indicative of MS.  Patient continues to have a lot of stress and is exacerbated by her complex regional pain symptoms etc.  Patient reports that she has not started back on the small sublingual ketamine doses but could not remember exactly why this was stopped.  She thinks it was around the time when she had significant illness with COVID.  The patient reports that Dr. Alessandra Ancona had wanted her to try a different medicine but does not remember what that medicine was.  I will address this issue with Dr. Alessandra Ancona.  06/25/2022: The patient comes in today reporting that her recent injections Dr. Alessandra Ancona provided were quite helpful and she has had less pain in her shoulder and neck region.  The patient reports that she hopes for it to last for a month or more.  Patient reports that some of the stressors that have been happening at home have significantly reduced.  She and her husband  continue to struggle with her  relationship but they are trying to work on aspects of their relationship and at this point she is focusing on trying to see if they can manage their relationship rather than separate and she has noted that he has been making efforts as well.  The relationship between her older sons and her husband continues to be strained but her husband is making efforts to try to address some of the conflicts.  This is developed some complications with her son and his wife expected to move into their house for period of time while they save money to buy their own house.  She is apprehensive about how that will play out.  12/15/2022: The patient comes in reporting that there have been a lot of psychosocial stressors with her and her husband as they are now separated and the patient's husband living outside of the house.  One of the big major issues had to do with her oldest son and some of the challenges that the son poses particularly for her husband.  This son is now planning to move in with his girlfriend into her house which is also causing a great deal of stress for the patient.  We addressed this issue a great deal today and continues to be problematic for her.  The patient continues with her significant pain symptoms particularly shoulder issues are playing a big impact on her overall pain now.  Patient is being considered for shoulder surgery but she is concerned how the potential surgery could impact her regional pain syndrome.  The patient is also being considered for new trial of the spinal cord stimulator.  04/02/2023: The patient reports that things have improved somewhat with the stressors associated with her husband and son.  She reports that her husband moved back in but some conflicts rearose and he is moved back out at least temporarily.  The patient reports that she is working on coping and managing her life so she can do so independently and set boundaries that do not exacerbate  her chronic pain symptoms etc.  The patient reports that her depression and anxiety type symptoms have been somewhat improved recently but she continues to have significant chronic pain and sleep disturbance.  Interventions Strategy:  Cognitive/behavioral therapeutic interventions along with coping skills and strategies around chronic pain, sleep disturbance and depression/anxiety.  Participation Level:   Active  Participation Quality:  Appropriate and Attentive      Behavioral Observation:  Well Groomed, Alert, and Appropriate.   Current Psychosocial Factors: Patient is still looking at having friend's grand daughter come live with patient and will be starting soon.   Content of Session:   Reviewed current symptoms and changes she is experienced since her cervical surgery and pain she experiences radiating down her arms.  We also reviewed strategies for restarting ketamine therapies but with a different strategy including sublingual ketamine.   Current Status:   The patient is hoping that they are able to do another spinal cord stimulator trial as she felt like it was helpful the first day before the device stopped working.  Patient Progress:   The patient reports that her overall sleep has improved from initial status but continues to be problematic.  The patient reports that her vein plays a big role in her difficulty sleeping.  Depression anxiety continue to be quite problematic for the patient.  Impression/Diagnosis:   [name] Decelle is a 48 year old female referred by [facility] for therapeutic interventions due to chronic pain symptoms.  The patient  reports that she has great difficulty standing, walking for long times, elbow pain, migraines, hip pain, shoulder and neck pain.  The patient fell at work and fractured both her elbows and also had knee replacement surgery.  The patient has been taking maintenance doses of opioid pain medications for some time.  The patient reports that  she has a great deal of difficulty functioning and has very poor sleep and wakes up numerous times each night.  The patient also has dealt with depression and anxiety for many years and has been hospitalized for depression anxiety years ago.  She has been receiving counseling for anxiety and depression and takes Trintellix.   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   The patient describes chronic issues of depression anxiety but the development of severe chronic pain  After a fall at work in 2015 where she fractured both of her elbows, injured her knee and developed fibromyalgia/regional pain syndrome.  The patient reports that she has ongoing difficulties walking distances or standing.  She describes elbow pain, trouble lifting things and trouble writing for any period of time.  The patient describes significant sleep disturbance and is unable to fall asleep at night.  She reports that she is always tired.  The patient describes a normal appetite.  The patient describes memory issues related to trouble remembering things and concentration issues.  The patient reports that she is significantly less active with her children and husband and that her husband has to help more due to her significant pain.  We have continue to work on issues related to her pain and sleep status along with issues of depression and anxiety.  02/28/2020: The patient reports that she has had some worsening in her significant pain symptoms with cold and atmospheric changes.  She continues to have significant issues with her upper extremities related to complex regional pain syndrome and fibromyalgia.  The patient also reports  that she has had significant psychosocial stressors with her closest friend having terminal cancer and being given 3 months to live.  The patient and her friend are talking about the patient taking over care of the patient's granddaughter and the patient also feels a great deal of need to do what ever she can for her friend going forward.  This is a very difficult and stressful situation for the patient on top of her numerous medical and pain issues.  The patient reports that her sleep continues to be improved but limited with pain disrupting sleep patterns.  The patient has been followed by Dr. Marilynne Shutter MD [facility]) for some time for pain management and she is now been referred for an evaluation for spinal cord stimulator trialing and possible implantation as well as being scheduled for ketamine infusions.  They are still working on Event organiser. coverage for the ketamine infusions.  I think that the patient is an excellent candidate for both of these possible options.  The patient and I talked about the appropriateness for ketamine infusions due to her wide spectrum of issues for some time now and I am quite pleased to find out that her treating pain specialist's clinic is now doing  ketamine infusions that she appears to be an excellent candidate.  She also appears to be an excellent candidate for spinal cord stimulator trialing and possible implantation.  04/24/2020: The patient returns reporting that she has gotten her spinal cord stimulator trialing device and has it implanted currently.  However, there have been mechanical problems with the device and it has been shutting off independently and the patient has not been able to get a good reading on how much it has helped her.  The patient reports that she did experience some brief improvement when she knew that it was on but it would turn off.  She is gone back to the device maker and they could not figure out the problem.  They are  going to put a new external device in and extend the spinal cord stimulator trial in face for 2 days.  The patient has also had her first ketamine infusion trial.  She reports that she was told she was hallucinating and dissociative during this phase and experienced some improvement over the rest of the day as far as her mood and pain symptoms but it was over within 24 hours.  The patient is scheduled to have her next ketamine infusion coming up.  08/07/2020: The patient reports that they are looking at doing a new spinal cord stimulator trial as the device did not work properly during her 7-day trial.  In fact, they attempted to extended to 10 days hoping to get it working correctly.  However, the patient reports that she felt like it did help the first day when it was apparently working but shortly stopped working and it sounds like there were never able to get it working properly.  The patient reports that at this point, her anesthesiologist/pain specialist does not think that she should have any more ketamine trials but the patient would like to try them again.  The patient reports that the doctor felt that she had an adverse reaction to it but her husband was present and did not describe any severe reaction.  At this point, she is planning to talk to him again about possibly having another ketamine infusion if possible.  The patient reports that she continues with her significant chronic pain symptoms but she is managing day-to-day.  She reports that there continues to be a major stressor in her life relative to a very close friend who has terminal cancer and the cancer is continuing to progress.  09/11/2020: The patient reports that her friend is now being managed by hospice and appears to be at the end of her life.  This has been stressful but the patient has been able to adjust to the impending reality with time and she feels like she is handling it fairly well.  The patient reports that she continues to  have significant pain in her arms radiating from her neck.  The patient reports that at this point she has not been able to return for ketamine therapies and not discussed strategies to follow-up with [facility].  11/06/2020: The patient has continued to have a lot of stress with depression and anxiety worsening particular around the impending death of a very close friend.  The friend is dying from metastatic cancer and the patient is one of the primary helpers for her friend.  The patient is very close to this friend.  The patient interacts with hospice and is spending a lot of time helping.  This is been coinciding with a worsening of  her cervical pain radiating down her right arm primarily.  She continues to struggle with severe pain and the depression and anxiety associated around all of these issues has been worse.  12/11/2020: The patient reports that she has had a very stressful time recently.  Her close friend has passed away and while her friend passed away peacefully and was in the care of hospice at the end of her life there is [employer] "shit show" happening around her friend's death.  The patient reports that the friends son who has been very active in the friend's life tragically died just a couple of days after the friend's death in a motor vehicle accident.  The son who had had little interaction with her friend did show up and there was conflict between the friend's new husband and the son regarding ownership of some of the property.  The friend had completed an extensive well that gave all of her estate to the grandchildren but there was still a great deal of arguing.  The husband had lifetime rights to the house for now.  The patient reports that the death of her friend's son happened before the funeral and so they ended up having a joint funeral for the son and the patient's friend.  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  Her pain continues to be very severe.  Today we continue to work on therapeutic interventions for coping and adjustment in dealing with significant depression and recent exacerbations and stress.  05/02/2021:  Patient continues to struggle with pain and has not returned back to [facility] yet for any interventions.  They are still working on plan.  Patient still with stress over friends death and the fall out over friends grandchild etc.  Still a very complicated situation  10/22/2021: The patient reports that she has continued to struggle with her chronic pain symptoms but reports that things have been stable as far as overall psychosocial issues and things are going well with her adopted daughter.  The patient reports that there have been ongoing issues with depression and anxiety.  Today we worked on coping and adjustment issues going forward and strategies to manage the symptoms.  12/26/2021: The patient returns today after 69-month break.  The patient continues to struggle with chronic pain symptoms.  The patient has not returned to see pain management group recently and [facility].  The patient reports that this has helped some but she continues with her significant pain.  The patient has been approved for ketamine therapies through [facility] but after 1 trial they felt she was not a good candidate although the patient wanted to continue with these efforts.  I have referred the patient to Dr. Alessandra Ancona here in our office for review about the appropriateness of sublingual ketamine treatment as the patient has verbalized willingness to continue with therapy and this strategy but a different type of very small doses delivered sublingually.  The patient is hoping that this will also help with her depression and other symptoms that go along with her pain.  I will sit down with Dr. Alessandra Ancona and reviewed this prior to the  patient's visit that is scheduled on 01/14/2022.  04/29/2022: The patient returns and reports that she has really been struggling with chronic pain lately and has a particularly bad week so far with regard to  pain and fatigue.  Patient saw neurology regarding her memory difficulties and impressions from that were consistent with my impression is that her memory difficulties are likely resulting from her chronic pain, depression and anxiety stress and other functional issues rather than a degenerative neurological condition.  She was assessed for possible multiple sclerosis and thus far all examinations around that possibility including ANA test and MRI were not indicative of MS.  Patient continues to have a lot of stress and is exacerbated by her complex regional pain symptoms etc.  Patient reports that she has not started back on the small sublingual ketamine doses but could not remember exactly why this was stopped.  She thinks it was around the time when she had significant illness with COVID.  The patient reports that Dr. Alessandra Ancona had wanted her to try a different medicine but does not remember what that medicine was.  I will address this issue with Dr. Alessandra Ancona.  06/25/2022: The patient comes in today reporting that her recent injections Dr. Alessandra Ancona provided were quite helpful and she has had less pain in her shoulder and neck region.  The patient reports that she hopes for it to last for a month or more.  Patient reports that some of the stressors that have been happening at home have significantly reduced.  She and her husband continue to struggle with her relationship but they are trying to work on aspects of their relationship and at this point she is focusing on trying to see if they can manage their relationship rather than separate and she has noted that he has been making efforts as well.  The relationship between her older sons and her husband continues to be strained but her husband is making efforts to  try to address some of the conflicts.  This is developed some complications with her son and his wife expected to move into their house for period of time while they save money to buy their own house.  She is apprehensive about how that will play out.  12/15/2022: The patient comes in reporting that there have been a lot of psychosocial stressors with her and her husband as they are now separated and the patient's husband living outside of the house.  One of the big major issues had to do with her oldest son and some of the challenges that the son poses particularly for her husband.  This son is now planning to move in with his girlfriend into her house which is also causing a great deal of stress for the patient.  We addressed this issue a great deal today and continues to be problematic for her.  The patient continues with her significant pain symptoms particularly shoulder issues are playing a big impact on her overall pain now.  Patient is being considered for shoulder surgery but she is concerned how the potential surgery could impact her regional pain syndrome.  The patient is also being considered for new trial of the spinal cord stimulator.  04/02/2023: The patient reports that things have improved somewhat with the stressors associated with her husband and son.  She reports that her husband moved back in but some conflicts rearose and he is moved back out at least temporarily.  The patient reports that she is working on coping and managing her life so she can do so independently and set boundaries that do not exacerbate her chronic pain symptoms etc.  The patient reports that her depression and anxiety type symptoms have been somewhat  improved recently but she continues to have significant chronic pain and sleep disturbance.  Diagnosis: Complex regional pain syndrome, fibromyalgia, anxiety and depression, neck, shoulder and back pain.

## 2023-06-02 ENCOUNTER — Encounter: Payer: No Typology Code available for payment source | Attending: Psychology | Admitting: Psychology

## 2023-06-02 DIAGNOSIS — M5010 Cervical disc disorder with radiculopathy, unspecified cervical region: Secondary | ICD-10-CM

## 2023-06-02 DIAGNOSIS — F419 Anxiety disorder, unspecified: Secondary | ICD-10-CM

## 2023-06-02 DIAGNOSIS — G90513 Complex regional pain syndrome I of upper limb, bilateral: Secondary | ICD-10-CM

## 2023-06-02 DIAGNOSIS — R4189 Other symptoms and signs involving cognitive functions and awareness: Secondary | ICD-10-CM | POA: Diagnosis present

## 2023-06-02 DIAGNOSIS — F32A Depression, unspecified: Secondary | ICD-10-CM | POA: Insufficient documentation

## 2023-06-02 DIAGNOSIS — G894 Chronic pain syndrome: Secondary | ICD-10-CM

## 2023-06-25 ENCOUNTER — Encounter: Payer: Self-pay | Admitting: Psychology

## 2023-06-25 NOTE — Progress Notes (Signed)
 Patient:  Sally [name]   DOB: [birth year]  MR Number: [id number]  Location: [facility] [address], [address] [place] [address] Dept: [phone]  Start: 9 AM End: 10 AM  .  Today's visit was an in person visit conducted in outpatient clinic office with the patient myself present.   Provider/Observer:     Marrion Sjogren PsyD  Chief Complaint:      Chief Complaint  Patient presents with   Pain   Anxiety   Depression   Sleeping Problem   Stress    Reason For Service:     Sally [name] is a 48 year old female referred by [facility] medical for therapeutic interventions due to chronic pain symptoms.  The patient reports that she has great difficulty standing, walking for long times, elbow pain, migraines, hip pain, shoulder and neck pain.  The patient fell at work and fractured both her elbows and also had knee replacement surgery.  The patient has been taking maintenance doses of opioid pain medications for some time.  The patient reports that she has a great deal of difficulty functioning and has very poor sleep and wakes up numerous times each night.  The patient also has dealt with depression and anxiety for many years and has been hospitalized for depression anxiety years ago.  She has been receiving counseling for anxiety and depression and takes Trintellix.   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   The patient describes chronic issues of depression anxiety but the development of severe chronic pain.  The patient reports that she is continuing to have some  pain in her back post surgery and had a recent fall.  Follow-up MRI did not suggest any structural damage or injury from this fall.  The patient reports that her symptoms associated with complex regional pain syndrome have worsened recently.  11/01/2019: The patient is continued to have significant pain and distress with worsening episodes of her complex regional pain syndrome and fibromyalgia type symptoms.  The patient has been very frustrated at times with her lack of ability to function and do daily activities.  01/03/2020: The patient reports that she is continued to have significant pain but has particularly had worsening of her fatigue and somnolence.  She reports that she feels like she is sleeping an adequate amount of time but is very tired and fatigued throughout the day.  02/28/2020: The patient reports that she has had some worsening in her significant pain symptoms with cold and atmospheric changes.  She continues to have significant issues with her upper extremities related to complex regional pain syndrome and fibromyalgia.  The patient also reports that she has had significant psychosocial stressors with her closest friend having terminal cancer and being given 3 months to live.  The patient and her friend are talking about the patient taking over care of the patient's granddaughter and the patient also feels a great deal of need to do what ever she can for her friend going forward.  This is a very difficult and stressful situation for the patient on top of her numerous medical  and pain issues.  The patient reports that her sleep continues to be improved but limited with pain disrupting sleep patterns.  The patient has been followed by Dr. Marilynne Shutter MD [facility]) for some time for pain management and she is now been referred for an evaluation for spinal cord stimulator trialing and possible implantation as well as being scheduled for ketamine  infusions.  They are still working on  Event organiser. coverage for the ketamine  infusions.  I think that the patient is an excellent candidate for both of these possible options.  The patient and I talked about the appropriateness for ketamine  infusions due to her wide spectrum of issues for some time now and I am quite pleased to find out that her treating pain specialist's clinic is now doing ketamine  infusions that she appears to be an excellent candidate.  She also appears to be an excellent candidate for spinal cord stimulator trialing and possible implantation.  03/27/2020: The patient reports that she has gone through all of the preliminary work-up to have a spinal cord stimulator trial conducted and is looking forward with hope that it will help with her significant cervical related pain.  The patient is also has an initial appointment to assess for possible ketamine  infusion therapies as well.  We reviewed issues related to both the spinal cord stimulator and ketamine  infusion with the patient as well as worked on therapeutic interventions around her chronic pain/complex regional pain syndrome diagnoses.  The patient reports that she still has significant psychosocial stressors with her closest friend dying from breast cancer and in the last month or 2 of her life.  At this point, the patient does not look like she will have to take over her friend's granddaughters care as the family is hoping that an uncle of the young girl will take over care.  04/24/2020: The patient returns reporting that she has gotten her spinal cord stimulator trialing device and has it implanted currently.  However, there have been mechanical problems with the device and it has been shutting off independently and the patient has not been able to get a good reading on how much it has helped her.  The patient reports that she did experience some brief improvement when she knew that it was on but it would turn off.  She is gone back to the device maker and  they could not figure out the problem.  They are going to put a new external device in and extend the spinal cord stimulator trial in face for 2 days.  The patient has also had her first ketamine  infusion trial.  She reports that she was told she was hallucinating and dissociative during this phase and experienced some improvement over the rest of the day as far as her mood and pain symptoms but it was over within 24 hours.  The patient is scheduled to have her next ketamine  infusion coming up.  08/07/2020: The patient reports that they are looking at doing a new spinal cord stimulator trial as the device did not work properly during her 7-day trial.  In fact, they attempted to extended to 10 days hoping to get it working correctly.  However, the patient reports that she felt like it did help the first day when it was apparently working but shortly stopped working and it sounds like there were never able to get it working properly.  The patient reports that at this point, her anesthesiologist/pain specialist does not think that she  should have any more ketamine  trials but the patient would like to try them again.  The patient reports that the doctor felt that she had an adverse reaction to it but her husband was present and did not describe any severe reaction.  At this point, she is planning to talk to him again about possibly having another ketamine  infusion if possible.  The patient reports that she continues with her significant chronic pain symptoms but she is managing day-to-day.  She reports that there continues to be a major stressor in her life relative to a very close friend who has terminal cancer and the cancer is continuing to progress.  09/11/2020: The patient reports that her friend is now being managed by hospice and appears to be at the end of her life.  This has been stressful but the patient has been able to adjust to the impending reality with time and she feels like she is handling it fairly  well.  The patient reports that she continues to have significant pain in her arms radiating from her neck.  The patient reports that at this point she has not been able to return for ketamine  therapies and not discussed strategies to follow-up with [facility].  11/06/2020: The patient has continued to have a lot of stress with depression and anxiety worsening particular around the impending death of a very close friend.  The friend is dying from metastatic cancer and the patient is one of the primary helpers for her friend.  The patient is very close to this friend.  The patient interacts with hospice and is spending a lot of time helping.  This is been coinciding with a worsening of her cervical pain radiating down her right arm primarily.  She continues to struggle with severe pain and the depression and anxiety associated around all of these issues has been worse.  12/11/2020: The patient reports that she has had a very stressful time recently.  Her close friend has passed away and while her friend passed away peacefully and was in the care of hospice at the end of her life there is [employer] "shit show" happening around her friend's death.  The patient reports that the friends son who has been very active in the friend's life tragically died just a couple of days after the friend's death in a motor vehicle accident.  The son who had had little interaction with her friend did show up and there was conflict between the friend's new husband and the son regarding ownership of some of the property.  The friend had completed an extensive well that gave all of her estate to the grandchildren but there was still a great deal of arguing.  The husband had lifetime rights to the house for now.  The patient reports that the death of her friend's son happened before the funeral and so they ended up having a joint funeral for the son and the patient's friend.  The patient reports that her depression has been  very severe and the fact that she is the executor for her friend's estate has had a great deal of stress.  Her pain continues to be very severe.  05/02/2021:  Patient continues to struggle with pain and has not returned back to [facility] yet for any interventions.  They are still working on plan.  Patient still with stress over friends death and the fall out over friends grandchild etc.  Still a very complicated situation.  08/05/2021: The patient  reports that she has had a lot on her plate since I saw her last in April.  The patient reports that she now has her friend's great-granddaughter living permanently with her and is becoming part of her family.  This young lady does have a lot of behavioral issues and it is a challenge for the patient to manage some of the issues.  However, she feels a reward for being able to provide a stable home and follow the wishes of her friend after her friend passed away from cancer.  11-06-21: The patient reports that she has continued to struggle with her chronic pain symptoms but reports that things have been stable as far as overall psychosocial issues and things are going well with her adopted daughter.  The patient reports that there have been ongoing issues with depression and anxiety.  Today we worked on coping and adjustment issues going forward and strategies to manage the symptoms.  12/26/2021: The patient returns today after 38-month break.  The patient continues to struggle with chronic pain symptoms.  The patient has not returned to see pain management group recently and [facility].  The patient reports that this has helped some but she continues with her significant pain.  The patient has been approved for ketamine  therapies through [facility] but after 1 trial they felt she was not a good candidate although the patient wanted to continue with these efforts.  I have referred the patient to Dr. Alessandra Ancona here in our office for review  about the appropriateness of sublingual ketamine  treatment as the patient has verbalized willingness to continue with therapy and this strategy but a different type of very small doses delivered sublingually.  The patient is hoping that this will also help with her depression and other symptoms that go along with her pain.  I will sit down with Dr. Alessandra Ancona and reviewed this prior to the patient's visit that is scheduled on 01/14/2022.  04/29/2022: The patient returns and reports that she has really been struggling with chronic pain lately and has a particularly bad week so far with regard to pain and fatigue.  Patient saw neurology regarding her memory difficulties and impressions from that were consistent with my impression is that her memory difficulties are likely resulting from her chronic pain, depression and anxiety stress and other functional issues rather than a degenerative neurological condition.  She was assessed for possible multiple sclerosis and thus far all examinations around that possibility including ANA test and MRI were not indicative of MS.  Patient continues to have a lot of stress and is exacerbated by her complex regional pain symptoms etc.  Patient reports that she has not started back on the small sublingual ketamine  doses but could not remember exactly why this was stopped.  She thinks it was around the time when she had significant illness with COVID.  The patient reports that Dr. Alessandra Ancona had wanted her to try a different medicine but does not remember what that medicine was.  I will address this issue with Dr. Alessandra Ancona.  06/25/2022: The patient comes in today reporting that her recent injections Dr. Alessandra Ancona provided were quite helpful and she has had less pain in her shoulder and neck region.  The patient reports that she hopes for it to last for a month or more.  Patient reports that some of the stressors that have been happening at home have significantly reduced.  She and her husband  continue to struggle with her  relationship but they are trying to work on aspects of their relationship and at this point she is focusing on trying to see if they can manage their relationship rather than separate and she has noted that he has been making efforts as well.  The relationship between her older sons and her husband continues to be strained but her husband is making efforts to try to address some of the conflicts.  This is developed some complications with her son and his wife expected to move into their house for period of time while they save money to buy their own house.  She is apprehensive about how that will play out.  12/15/2022: The patient comes in reporting that there have been a lot of psychosocial stressors with her and her husband as they are now separated and the patient's husband living outside of the house.  One of the big major issues had to do with her oldest son and some of the challenges that the son poses particularly for her husband.  This son is now planning to move in with his girlfriend into her house which is also causing a great deal of stress for the patient.  We addressed this issue a great deal today and continues to be problematic for her.  The patient continues with her significant pain symptoms particularly shoulder issues are playing a big impact on her overall pain now.  Patient is being considered for shoulder surgery but she is concerned how the potential surgery could impact her regional pain syndrome.  The patient is also being considered for new trial of the spinal cord stimulator.  05/05/2023: The patient reports that there continues to be some psychosocial stressors going on.  She and her husband were back to living in the same house again until very recently when a disagreement developed and he moved back out.  There have been some improvements overall in psychosocial stressors.  The patient notes that she has been having increasing problems with somnolence  etc.  The issue of obstructive sleep apnea has been raised again and the patient is planning on having annual workup.  The patient had been previously diagnosed with obstructive sleep apnea but did not tolerate the mask but this was years ago.  I stressed again the need for this to be treated if it is present as it can be playing a role in exacerbating her pain, increasing depression and anxiety symptoms and cognitive difficulties.  The patient reported that she will follow up on getting it scheduled for a sleep study.  We talked about the need for acclimating to a CPAP machine and what that entails and how to work towards success if the patient's previous diagnosis is confirmed again.  Interventions Strategy:  Cognitive/behavioral therapeutic interventions along with coping skills and strategies around chronic pain, sleep disturbance and depression/anxiety.  Participation Level:   Active  Participation Quality:  Appropriate and Attentive      Behavioral Observation:  Well Groomed, Alert, and Appropriate.   Current Psychosocial Factors: Patient is still looking at having friend's grand daughter come live with patient and will be starting soon.   Content of Session:   Reviewed current symptoms and changes she is experienced since her cervical surgery and pain she experiences radiating down her arms.  We also reviewed strategies for restarting ketamine  therapies but with a different strategy including sublingual ketamine .   Current Status:   The patient is hoping that they are able to do another spinal cord stimulator trial  as she felt like it was helpful the first day before the device stopped working.  Patient Progress:   The patient reports that her overall sleep has improved from initial status but continues to be problematic.  The patient reports that her vein plays a big role in her difficulty sleeping.  Depression anxiety continue to be quite problematic for the  patient.  Impression/Diagnosis:   Sally [name] is a 48 year old female referred by [facility] medical for therapeutic interventions due to chronic pain symptoms.  The patient reports that she has great difficulty standing, walking for long times, elbow pain, migraines, hip pain, shoulder and neck pain.  The patient fell at work and fractured both her elbows and also had knee replacement surgery.  The patient has been taking maintenance doses of opioid pain medications for some time.  The patient reports that she has a great deal of difficulty functioning and has very poor sleep and wakes up numerous times each night.  The patient also has dealt with depression and anxiety for many years and has been hospitalized for depression anxiety years ago.  She has been receiving counseling for anxiety and depression and takes Trintellix.   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   The patient describes chronic issues of depression anxiety but the development of severe chronic pain  After a fall at work in 2015 where she fractured both of her elbows, injured her knee and developed fibromyalgia/regional pain syndrome.  The patient reports that she has ongoing difficulties walking distances or standing.  She describes elbow pain, trouble lifting things and trouble writing for any period of time.  The patient describes significant sleep disturbance and is unable to fall asleep at night.  She reports that she is always tired.  The patient describes a normal appetite.  The patient describes memory issues related to trouble remembering things and concentration issues.  The patient reports that she is significantly  less active with her children and husband and that her husband has to help more due to her significant pain.  We have continue to work on issues related to her pain and sleep status along with issues of depression and anxiety.  02/28/2020: The patient reports that she has had some worsening in her significant pain symptoms with cold and atmospheric changes.  She continues to have significant issues with her upper extremities related to complex regional pain syndrome and fibromyalgia.  The patient also reports that she has had significant psychosocial stressors with her closest friend having terminal cancer and being given 3 months to live.  The patient and her friend are talking about the patient taking over care of the patient's granddaughter and the patient also feels a great deal of need to do what ever she can for her friend going forward.  This is a very difficult and stressful situation for the patient on top of her numerous medical and pain issues.  The patient reports that her sleep continues to be improved but limited with pain disrupting sleep patterns.  The patient has been followed by Dr. Marilynne Shutter MD [facility]) for some time for pain management and she is now been referred for an  evaluation for spinal cord stimulator trialing and possible implantation as well as being scheduled for ketamine  infusions.  They are still working on Event organiser. coverage for the ketamine  infusions.  I think that the patient is an excellent candidate for both of these possible options.  The patient and I talked about the appropriateness for ketamine  infusions due to her wide spectrum of issues for some time now and I am quite pleased to find out that her treating pain specialist's clinic is now doing ketamine  infusions that she appears to be an excellent candidate.  She also appears to be an excellent candidate for spinal cord stimulator trialing and possible implantation.  04/24/2020: The patient  returns reporting that she has gotten her spinal cord stimulator trialing device and has it implanted currently.  However, there have been mechanical problems with the device and it has been shutting off independently and the patient has not been able to get a good reading on how much it has helped her.  The patient reports that she did experience some brief improvement when she knew that it was on but it would turn off.  She is gone back to the device maker and they could not figure out the problem.  They are going to put a new external device in and extend the spinal cord stimulator trial in face for 2 days.  The patient has also had her first ketamine  infusion trial.  She reports that she was told she was hallucinating and dissociative during this phase and experienced some improvement over the rest of the day as far as her mood and pain symptoms but it was over within 24 hours.  The patient is scheduled to have her next ketamine  infusion coming up.  08/07/2020: The patient reports that they are looking at doing a new spinal cord stimulator trial as the device did not work properly during her 7-day trial.  In fact, they attempted to extended to 10 days hoping to get it working correctly.  However, the patient reports that she felt like it did help the first day when it was apparently working but shortly stopped working and it sounds like there were never able to get it working properly.  The patient reports that at this point, her anesthesiologist/pain specialist does not think that she should have any more ketamine  trials but the patient would like to try them again.  The patient reports that the doctor felt that she had an adverse reaction to it but her husband was present and did not describe any severe reaction.  At this point, she is planning to talk to him again about possibly having another ketamine  infusion if possible.  The patient reports that she continues with her significant chronic pain symptoms  but she is managing day-to-day.  She reports that there continues to be a major stressor in her life relative to a very close friend who has terminal cancer and the cancer is continuing to progress.  09/11/2020: The patient reports that her friend is now being managed by hospice and appears to be at the end of her life.  This has been stressful but the patient has been able to adjust to the impending reality with time and she feels like she is handling it fairly well.  The patient reports that she continues to have significant pain in her arms radiating from her neck.  The patient reports that at this point she has not been able to return for ketamine  therapies and  not discussed strategies to follow-up with [facility].  11/06/2020: The patient has continued to have a lot of stress with depression and anxiety worsening particular around the impending death of a very close friend.  The friend is dying from metastatic cancer and the patient is one of the primary helpers for her friend.  The patient is very close to this friend.  The patient interacts with hospice and is spending a lot of time helping.  This is been coinciding with a worsening of her cervical pain radiating down her right arm primarily.  She continues to struggle with severe pain and the depression and anxiety associated around all of these issues has been worse.  12/11/2020: The patient reports that she has had a very stressful time recently.  Her close friend has passed away and while her friend passed away peacefully and was in the care of hospice at the end of her life there is [employer] "shit show" happening around her friend's death.  The patient reports that the friends son who has been very active in the friend's life tragically died just a couple of days after the friend's death in a motor vehicle accident.  The son who had had little interaction with her friend did show up and there was conflict between the friend's new  husband and the son regarding ownership of some of the property.  The friend had completed an extensive well that gave all of her estate to the grandchildren but there was still a great deal of arguing.  The husband had lifetime rights to the house for now.  The patient reports that the death of her friend's son happened before the funeral and so they ended up having a joint funeral for the son and the patient's friend.  The patient reports that her depression has been very severe and the fact that she is the executor for her friend's estate has had a great deal of stress.  Her pain continues to be very severe.  Today we continue to work on therapeutic interventions for coping and adjustment in dealing with significant depression and recent exacerbations and stress.  05/02/2021:  Patient continues to struggle with pain and has not returned back to [facility] yet for any interventions.  They are still working on plan.  Patient still with stress over friends death and the fall out over friends grandchild etc.  Still a very complicated situation  10/22/2021: The patient reports that she has continued to struggle with her chronic pain symptoms but reports that things have been stable as far as overall psychosocial issues and things are going well with her adopted daughter.  The patient reports that there have been ongoing issues with depression and anxiety.  Today we worked on coping and adjustment issues going forward and strategies to manage the symptoms.  12/26/2021: The patient returns today after 41-month break.  The patient continues to struggle with chronic pain symptoms.  The patient has not returned to see pain management group recently and [facility].  The patient reports that this has helped some but she continues with her significant pain.  The patient has been approved for ketamine  therapies through [facility] but after 1 trial they felt she was not a good candidate although  the patient wanted to continue with these efforts.  I have referred the patient to Dr. Alessandra Ancona here in our office for review about the appropriateness of sublingual ketamine  treatment as the patient has verbalized willingness  to continue with therapy and this strategy but a different type of very small doses delivered sublingually.  The patient is hoping that this will also help with her depression and other symptoms that go along with her pain.  I will sit down with Dr. Alessandra Ancona and reviewed this prior to the patient's visit that is scheduled on 01/14/2022.  04/29/2022: The patient returns and reports that she has really been struggling with chronic pain lately and has a particularly bad week so far with regard to pain and fatigue.  Patient saw neurology regarding her memory difficulties and impressions from that were consistent with my impression is that her memory difficulties are likely resulting from her chronic pain, depression and anxiety stress and other functional issues rather than a degenerative neurological condition.  She was assessed for possible multiple sclerosis and thus far all examinations around that possibility including ANA test and MRI were not indicative of MS.  Patient continues to have a lot of stress and is exacerbated by her complex regional pain symptoms etc.  Patient reports that she has not started back on the small sublingual ketamine  doses but could not remember exactly why this was stopped.  She thinks it was around the time when she had significant illness with COVID.  The patient reports that Dr. Alessandra Ancona had wanted her to try a different medicine but does not remember what that medicine was.  I will address this issue with Dr. Alessandra Ancona.  06/25/2022: The patient comes in today reporting that her recent injections Dr. Alessandra Ancona provided were quite helpful and she has had less pain in her shoulder and neck region.  The patient reports that she hopes for it to last for a month or more.   Patient reports that some of the stressors that have been happening at home have significantly reduced.  She and her husband continue to struggle with her relationship but they are trying to work on aspects of their relationship and at this point she is focusing on trying to see if they can manage their relationship rather than separate and she has noted that he has been making efforts as well.  The relationship between her older sons and her husband continues to be strained but her husband is making efforts to try to address some of the conflicts.  This is developed some complications with her son and his wife expected to move into their house for period of time while they save money to buy their own house.  She is apprehensive about how that will play out.  12/15/2022: The patient comes in reporting that there have been a lot of psychosocial stressors with her and her husband as they are now separated and the patient's husband living outside of the house.  One of the big major issues had to do with her oldest son and some of the challenges that the son poses particularly for her husband.  This son is now planning to move in with his girlfriend into her house which is also causing a great deal of stress for the patient.  We addressed this issue a great deal today and continues to be problematic for her.  The patient continues with her significant pain symptoms particularly shoulder issues are playing a big impact on her overall pain now.  Patient is being considered for shoulder surgery but she is concerned how the potential surgery could impact her regional pain syndrome.  The patient is also being considered for new trial of the spinal cord  stimulator.  05/05/2023: The patient reports that there continues to be some psychosocial stressors going on.  She and her husband were back to living in the same house again until very recently when a disagreement developed and he moved back out.  There have been some  improvements overall in psychosocial stressors.  The patient notes that she has been having increasing problems with somnolence etc.  The issue of obstructive sleep apnea has been raised again and the patient is planning on having annual workup.  The patient had been previously diagnosed with obstructive sleep apnea but did not tolerate the mask but this was years ago.  I stressed again the need for this to be treated if it is present as it can be playing a role in exacerbating her pain, increasing depression and anxiety symptoms and cognitive difficulties.  The patient reported that she will follow up on getting it scheduled for a sleep study.  We talked about the need for acclimating to a CPAP machine and what that entails and how to work towards success if the patient's previous diagnosis is confirmed again.  Diagnosis: Complex regional pain syndrome, fibromyalgia, anxiety and depression, neck, shoulder and back pain.

## 2023-09-22 ENCOUNTER — Inpatient Hospital Stay: Attending: Hematology | Admitting: Hematology

## 2023-09-22 ENCOUNTER — Inpatient Hospital Stay

## 2023-09-22 VITALS — BP 156/95 | HR 80 | Temp 97.7°F | Resp 20 | Wt 283.3 lb

## 2023-09-22 DIAGNOSIS — R233 Spontaneous ecchymoses: Secondary | ICD-10-CM | POA: Diagnosis not present

## 2023-09-22 DIAGNOSIS — E039 Hypothyroidism, unspecified: Secondary | ICD-10-CM | POA: Insufficient documentation

## 2023-09-22 DIAGNOSIS — F17210 Nicotine dependence, cigarettes, uncomplicated: Secondary | ICD-10-CM | POA: Insufficient documentation

## 2023-09-22 DIAGNOSIS — F419 Anxiety disorder, unspecified: Secondary | ICD-10-CM | POA: Insufficient documentation

## 2023-09-22 DIAGNOSIS — G9050 Complex regional pain syndrome I, unspecified: Secondary | ICD-10-CM | POA: Insufficient documentation

## 2023-09-22 LAB — CBC WITH DIFFERENTIAL (CANCER CENTER ONLY)
Abs Immature Granulocytes: 0.03 K/uL (ref 0.00–0.07)
Basophils Absolute: 0.1 K/uL (ref 0.0–0.1)
Basophils Relative: 1 %
Eosinophils Absolute: 0.2 K/uL (ref 0.0–0.5)
Eosinophils Relative: 2 %
HCT: 40 % (ref 36.0–46.0)
Hemoglobin: 13.3 g/dL (ref 12.0–15.0)
Immature Granulocytes: 0 %
Lymphocytes Relative: 32 %
Lymphs Abs: 3.1 K/uL (ref 0.7–4.0)
MCH: 32.4 pg (ref 26.0–34.0)
MCHC: 33.3 g/dL (ref 30.0–36.0)
MCV: 97.3 fL (ref 80.0–100.0)
Monocytes Absolute: 0.7 K/uL (ref 0.1–1.0)
Monocytes Relative: 7 %
Neutro Abs: 5.4 K/uL (ref 1.7–7.7)
Neutrophils Relative %: 58 %
Platelet Count: 266 K/uL (ref 150–400)
RBC: 4.11 MIL/uL (ref 3.87–5.11)
RDW: 13.6 % (ref 11.5–15.5)
WBC Count: 9.4 K/uL (ref 4.0–10.5)
nRBC: 0 % (ref 0.0–0.2)

## 2023-09-22 LAB — FIBRINOGEN: Fibrinogen: 350 mg/dL (ref 210–475)

## 2023-09-22 LAB — CMP (CANCER CENTER ONLY)
ALT: 23 U/L (ref 0–44)
AST: 15 U/L (ref 15–41)
Albumin: 3.6 g/dL (ref 3.5–5.0)
Alkaline Phosphatase: 80 U/L (ref 38–126)
Anion gap: 6 (ref 5–15)
BUN: 16 mg/dL (ref 6–20)
CO2: 28 mmol/L (ref 22–32)
Calcium: 8.8 mg/dL — ABNORMAL LOW (ref 8.9–10.3)
Chloride: 107 mmol/L (ref 98–111)
Creatinine: 0.67 mg/dL (ref 0.44–1.00)
GFR, Estimated: >60 mL/min (ref >60)
Glucose, Bld: 86 mg/dL (ref 70–99)
Potassium: 3.8 mmol/L (ref 3.5–5.1)
Sodium: 141 mmol/L (ref 135–145)
Total Bilirubin: 0.3 mg/dL (ref 0.0–1.2)
Total Protein: 6.4 g/dL — ABNORMAL LOW (ref 6.5–8.1)

## 2023-09-22 LAB — SEDIMENTATION RATE: Sed Rate: 18 mm/h (ref 0–22)

## 2023-09-22 LAB — PROTIME-INR
INR: 0.9 (ref 0.8–1.2)
Prothrombin Time: 12.8 s (ref 11.4–15.2)

## 2023-09-22 LAB — PLATELET FUNCTION ASSAY: Collagen / Epinephrine: 173 s (ref 0–193)

## 2023-09-22 LAB — APTT: aPTT: 26 s (ref 24–36)

## 2023-09-22 NOTE — Progress Notes (Unsigned)
 [facility]   HEMATOLOGY/ONCOLOGY CONSULTATION NOTE  Date of Service: 09/22/2023  Patient Care Team: Leron Millman, NP as PCP - General (Nurse Practitioner)  CHIEF COMPLAINTS/PURPOSE OF CONSULTATION:  Easy brusability  HISTORY OF PRESENTING ILLNESS:  Sally [name] is a wonderful 48 y.o. female who has been referred to us  by Dr CLAY for evaluation and management of ***  MEDICAL HISTORY:  Past Medical History:  Diagnosis Date   Acute appendicitis 09/09/2012   Anxiety    Back pain    Occ. issues wih back pain   Back pain 09/09/2012   It is intermittent now,  previously source of fibromyalgia dx.     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)    CRPS (complex regional pain syndrome), lower limb 2018   Depression    Edema of both lower extremities    Fibromyalgia    GERD (gastroesophageal reflux disease)    Headache    Hypothyroid 09/09/2012   Hypothyroidism    Joint pain    Osteoarthritis    Palpitation    only when having anxiety   Precancerous changes of the cervix    Sleep apnea    pre lap banding-never used cpap or mask   SOB (shortness of breath)    Swallowing difficulty    Vitamin D  deficiency     SURGICAL HISTORY: Past Surgical History:  Procedure Laterality Date   ANTERIOR CERVICAL DECOMP/DISCECTOMY FUSION N/A 10/27/2018   Procedure: ANTERIOR CERVICAL DECOMPRESSION/DISCECTOMY CERVICAL FIVE-SIX, CERVICAL SIX-SEVEN;  Surgeon: Burnetta Aures, MD;  Location: [facility] OR;  Service: Orthopedics;  Laterality: N/A;   BILATERAL SALPINGECTOMY Bilateral 11/05/2015   Procedure: BILATERAL SALPINGECTOMY;  Surgeon: Ronal GORMAN Pinal, MD;  Location: [facility]S;  Service: Gynecology;  Laterality: Bilateral;   BRAIN SURGERY  1992   remove blood clot after a fall   CESAREAN SECTION  1997, 2013   cyst on ovary     CYSTOSCOPY N/A 11/05/2015   Procedure: CYSTOSCOPY;  Surgeon: Ronal GORMAN Pinal, MD;  Location: [facility];  Service: Gynecology;  Laterality:  N/A;   GASTRIC BANDING PORT REVISION  01/20/2012   Procedure: GASTRIC BANDING PORT REVISION;  Surgeon: Donnice KATHEE Lunger, MD;  Location: [facility] OR[facility];  Service: General;  Laterality: N/A;   KNEE ARTHROSCOPY Left    KNEE ARTHROSCOPY WITH DRILLING/MICROFRACTURE Right 12/06/2014   Procedure: KNEE ARTHROSCOPY WITH DRILLING/MICROFRACTURE, CHONDROPLASTY, EXCISION OF PLICA;  Surgeon: Norleen Gavel, MD;  Location: [facility];  Service: Orthopedics;  Laterality: Right;   KNEE SURGERY Right 06/26/2016   [facility]   LAPAROSCOPIC APPENDECTOMY N/A 09/09/2012   Procedure: APPENDECTOMY LAPAROSCOPIC;  Surgeon: Deward GORMAN Curvin DOUGLAS, MD;  Location[facility];  Service: General;  Laterality: N/A;   LAPAROSCOPIC GASTRIC BANDING  1997   LAPAROSCOPIC HYSTERECTOMY N/A 11/05/2015   Procedure: HYSTERECTOMY TOTAL LAPAROSCOPIC;  Surgeon: Ronal GORMAN Pinal, MD;  Location: [facility];  Service: Gynecology;  Laterality: N/A;   LAPAROSCOPY  08/19/2010   Procedure: LAPAROSCOPY OPERATIVE;  Surgeon: CHRISTELLA Elvie Pinal;  Location: [facility];  Service: Gynecology;  Laterality: N/A;  with Biopsy of uterine serosa   TUBAL LIGATION  08/2011    SOCIAL HISTORY: Social History   Socioeconomic History   Marital status: Married    Spouse name: Gustav   Number of children: 2   Years of education: Not on file   Highest education level: Some college, no degree  Occupational History  Comment: disabled   Occupation: stay at home  Tobacco Use   Smoking status: Every Day    Current packs/day: 1.00    Average packs/day: 1 pack/day for 25.7 years (25.7 ttl pk-yrs)    Types: Cigarettes    Start date: 2000   Smokeless tobacco: Never  Vaping Use   Vaping status: Never Used  Substance and Sexual Activity   Alcohol use: Not Currently   Drug use: No   Sexual activity: Yes    Partners: Male    Birth control/protection: Surgical    Comment: TLH  Other Topics Concern   Not on file  Social History Narrative   Lives with  husband, son   Caffeine - coffee 4 c daily   Social Drivers of Corporate investment banker Strain: Not on file  Food Insecurity: No Food Insecurity (03/02/2020)   [facility]   Hunger Vital Sign    Within the past 12 months, you worried that your food would run out before you got the money to buy more.: Never true    Within the past 12 months, the food you bought just didn't last and you didn't have money to get more.: Never true  Transportation Needs: Not on file  Physical Activity: Not on file  Stress: Not on file  Social Connections: Unknown (06/02/2021)   Received from [facility]   Social Network    Social Network: Not on file  Intimate Partner Violence: Unknown (04/24/2021)   Received from [facility]   HITS    Physically Hurt: Not on file    Insult or Talk Down To: Not on file    Threaten Physical Harm: Not on file    Scream or Curse: Not on file    FAMILY HISTORY: Family History  Problem Relation Age of Onset   Hypertension Mother    Depression Mother    Anxiety disorder Mother    Obesity Mother    Autoimmune disease Sister    Hypertension Maternal Grandfather    Diabetes Paternal Grandmother    Anesthesia problems Neg Hx     ALLERGIES:  is allergic to oxycodone -acetaminophen , codeine, duloxetine , paroxetine hcl, duloxetine  hcl, and percocet [oxycodone -acetaminophen ].  MEDICATIONS:  Current Outpatient Medications  Medication Sig Dispense Refill   ALPRAZolam (XANAX) 0.25 MG tablet Take 0.25 mg by mouth daily.     Aspirin-Acetaminophen -Caffeine (EXCEDRIN MIGRAINE PO) Take by mouth. prn     Buprenorphine HCl (BELBUCA) 150 MCG FILM Place 150 mg inside cheek at bedtime.     buPROPion (WELLBUTRIN SR) 150 MG 12 hr tablet Take 150 mg by mouth daily.     cyclobenzaprine  (FLEXERIL ) 10 MG tablet Take 1 tablet (10 mg total) by mouth 2 (two) times daily as needed for muscle spasms. 20 tablet 0   hydrochlorothiazide  (HYDRODIURIL ) 50 MG tablet Take by mouth  as needed.     hydroxypropyl methylcellulose / hypromellose (ISOPTO TEARS / GONIOVISC) 2.5 % ophthalmic solution Place 1 drop into both eyes 3 (three) times daily as needed for dry eyes.     ketorolac  (TORADOL ) 10 MG tablet Take 10 mg by mouth as needed.     levothyroxine  (SYNTHROID ) 175 MCG tablet Take 175 mcg by mouth daily.     lidocaine  (LIDODERM ) 5 % Place 1 patch onto the skin daily. Remove & Discard patch within 12 hours or as directed by MD 15 patch 0   liothyronine  (CYTOMEL ) 5 MCG tablet Take 3 tablets (15 mcg total) by mouth daily. Take  2 tablets at breakfast and take one tablet at lunch (Patient taking differently: Take 5-10 mcg by mouth See admin instructions. Take 10 mcg by mouth in the morning and 5 mcg in the evening) 270 tablet 1   loratadine  (CLARITIN ) 10 MG tablet Take 10 mg by mouth daily as needed.     metFORMIN  (GLUCOPHAGE ) 500 MG tablet Take 1 tablet (500 mg total) by mouth 2 (two) times daily with a meal. 120 tablet 0   methocarbamol  (ROBAXIN ) 500 MG tablet Take 500 mg by mouth every 8 (eight) hours as needed for muscle spasms.     NONFORMULARY OR COMPOUNDED ITEM Estradiol  cream 0.02% in 1ml prefilled syringes.  1ml pv twice weekly.  #36month supply. (Patient taking differently: Estradiol  cream 0.02% in 1ml prefilled syringes.  1ml pv twice weekly.  #28month supply.) 1 each 3   omeprazole (PRILOSEC) 40 MG capsule Take 40 mg by mouth daily.     Oxycodone  HCl 10 MG TABS SMARTSIG:1 Tablet(s) By Mouth 6 Times Daily PRN     pantoprazole  (PROTONIX ) 40 MG tablet Take 40 mg by mouth daily.     polyethylene glycol (MIRALAX  / GLYCOLAX ) 17 g packet Take 17 g by mouth daily.     Sennosides (SENOKOT EXTRA STRENGTH) 17.2 MG TABS Take 17.2 mg by mouth daily.     SUMAtriptan (IMITREX) 25 MG tablet Take 25 mg by mouth at bedtime as needed for migraine.     Testosterone 10 MG/ACT (2%) GEL Apply 1 Pump topically at bedtime.     topiramate  (TOPAMAX ) 50 MG tablet Take 1 tablet (50 mg total) by mouth  daily. 60 tablet 0   venlafaxine  XR (EFFEXOR -XR) 75 MG 24 hr capsule Take 75 mg by mouth daily with breakfast.     Vitamin D , Ergocalciferol , (DRISDOL ) 1.25 MG (50000 UNIT) CAPS capsule TAKE ONE CAPSULE EVERY 7 DAYS 8 capsule 0   No current facility-administered medications for this visit.    REVIEW OF SYSTEMS:    10 Point review of Systems was done is negative except as noted above.  PHYSICAL EXAMINATION: ECOG PERFORMANCE STATUS: {CHL ONC ECOG ED:[id number]}  . Vitals:   09/22/23 1109 09/22/23 1115  BP: (!) 172/87 (!) 156/95  Pulse: 80   Resp: 20   Temp: 97.7 F (36.5 C)   SpO2: 98%    Filed Weights   09/22/23 1109  Weight: 283 lb 4.8 oz (128.5 kg)   .Body mass index is 43.08 kg/m.  GENERAL:alert, in no acute distress and comfortable SKIN: no acute rashes, no significant lesions EYES: conjunctiva are pink and non-injected, sclera anicteric OROPHARYNX: MMM, no exudates, no oropharyngeal erythema or ulceration NECK: supple, no JVD LYMPH:  no palpable lymphadenopathy in the cervical, axillary or inguinal regions LUNGS: clear to auscultation b/l with normal respiratory effort HEART: regular rate & rhythm ABDOMEN:  normoactive bowel sounds , non tender, not distended. Extremity: no pedal edema PSYCH: alert & oriented x 3 with fluent speech NEURO: no focal motor/sensory deficits  LABORATORY DATA:  I have reviewed the data as listed  .    Latest Ref Rng & Units 01/24/2023    8:54 PM 09/08/2021    6:53 PM 10/20/2018    8:46 AM  CBC  WBC 4.0 - 10.5 K/uL 8.3  8.1  8.9   Hemoglobin 12.0 - 15.0 g/dL 85.4  85.7  84.8   Hematocrit 36.0 - 46.0 % 44.9  43.6  44.5   Platelets 150 - 400 K/uL 260  256  194     .  Latest Ref Rng & Units 01/24/2023    9:35 PM 09/08/2021    6:53 PM 05/06/2021   10:15 AM  CMP  Glucose 70 - 99 mg/dL 887  89  79   BUN 6 - 20 mg/dL 11  13  13    Creatinine 0.44 - 1.00 mg/dL 9.29  9.26  9.27   Sodium 135 - 145 mmol/L 139  141  141   Potassium  3.5 - 5.1 mmol/L 4.1  4.1  4.6   Chloride 98 - 111 mmol/L 104  107  106   CO2 22 - 32 mmol/L 29  27  24    Calcium 8.9 - 10.3 mg/dL 9.1  9.0  9.3   Total Protein 6.5 - 8.1 g/dL 7.0   6.4   Total Bilirubin 0.0 - 1.2 mg/dL 0.3   0.2   Alkaline Phos 38 - 126 U/L 104   92   AST 15 - 41 U/L 14   17   ALT 0 - 44 U/L 16   17      RADIOGRAPHIC STUDIES: I have personally reviewed the radiological images as listed and agreed with the findings in the report. No results found.  ASSESSMENT & PLAN:  ***  All of the patients questions were answered with apparent satisfaction. The patient knows to call the clinic with any problems, questions or concerns.  I spent {CHL ONC TIME VISIT - DTPQU:[id number]} counseling the patient face to face. The total time spent in the appointment was {CHL ONC TIME VISIT - DTPQU:[id number]} and more than 50% was on counseling and direct patient cares.    Emaline Saran MD MS AAHIVMS [facility] Hematology/Oncology Physician [facility]  (Office):       [phone] (Work cell):  [phone] (Fax):           [phone]  09/22/2023 11:22 AM

## 2023-09-23 LAB — COAG STUDIES INTERP REPORT

## 2023-09-23 LAB — VON WILLEBRAND PANEL
Coagulation Factor VIII: 202 % — ABNORMAL HIGH (ref 56–140)
Ristocetin Co-factor, Plasma: 191 % (ref 50–200)
Von Willebrand Antigen, Plasma: 207 % — ABNORMAL HIGH (ref 50–200)

## 2023-09-23 LAB — KAPPA/LAMBDA LIGHT CHAINS
Kappa free light chain: 17 mg/L (ref 3.3–19.4)
Kappa, lambda light chain ratio: 1.1 (ref 0.26–1.65)
Lambda free light chains: 15.5 mg/L (ref 5.7–26.3)

## 2023-09-24 ENCOUNTER — Encounter: Payer: Medicare Other | Attending: Psychology | Admitting: Psychology

## 2023-09-25 ENCOUNTER — Other Ambulatory Visit: Payer: Self-pay

## 2023-09-25 ENCOUNTER — Ambulatory Visit (HOSPITAL_BASED_OUTPATIENT_CLINIC_OR_DEPARTMENT_OTHER)
Admission: RE | Admit: 2023-09-25 | Discharge: 2023-09-25 | Disposition: A | Discharge status: Home / Self Care | Source: Ambulatory Visit | Attending: Emergency Medicine | Admitting: Emergency Medicine

## 2023-09-25 ENCOUNTER — Encounter (HOSPITAL_BASED_OUTPATIENT_CLINIC_OR_DEPARTMENT_OTHER): Payer: Self-pay

## 2023-09-25 ENCOUNTER — Emergency Department (HOSPITAL_BASED_OUTPATIENT_CLINIC_OR_DEPARTMENT_OTHER)
Admission: EM | Admit: 2023-09-25 | Discharge: 2023-09-25 | Disposition: A | Discharge status: Home / Self Care | Attending: Emergency Medicine | Admitting: Emergency Medicine

## 2023-09-25 DIAGNOSIS — I10 Essential (primary) hypertension: Secondary | ICD-10-CM | POA: Diagnosis not present

## 2023-09-25 DIAGNOSIS — M7989 Other specified soft tissue disorders: Secondary | ICD-10-CM | POA: Diagnosis present

## 2023-09-25 DIAGNOSIS — Z7982 Long term (current) use of aspirin: Secondary | ICD-10-CM | POA: Insufficient documentation

## 2023-09-25 DIAGNOSIS — Z79899 Other long term (current) drug therapy: Secondary | ICD-10-CM | POA: Insufficient documentation

## 2023-09-25 DIAGNOSIS — M79661 Pain in right lower leg: Secondary | ICD-10-CM | POA: Insufficient documentation

## 2023-09-25 DIAGNOSIS — E039 Hypothyroidism, unspecified: Secondary | ICD-10-CM | POA: Insufficient documentation

## 2023-09-25 LAB — MULTIPLE MYELOMA PANEL, SERUM
Albumin SerPl Elph-Mcnc: 3.2 g/dL (ref 2.9–4.4)
Albumin/Glob SerPl: 1.2 (ref 0.7–1.7)
Alpha 1: 0.2 g/dL (ref 0.0–0.4)
Alpha2 Glob SerPl Elph-Mcnc: 0.8 g/dL (ref 0.4–1.0)
B-Globulin SerPl Elph-Mcnc: 1.3 g/dL (ref 0.7–1.3)
Gamma Glob SerPl Elph-Mcnc: 0.6 g/dL (ref 0.4–1.8)
Globulin, Total: 2.9 g/dL (ref 2.2–3.9)
IgA: 379 mg/dL — ABNORMAL HIGH (ref 87–352)
IgG (Immunoglobin G), Serum: 796 mg/dL (ref 586–1602)
IgM (Immunoglobulin M), Srm: 122 mg/dL (ref 26–217)
Total Protein ELP: 6.1 g/dL (ref 6.0–8.5)

## 2023-09-25 MED ORDER — ENOXAPARIN SODIUM 150 MG/ML IJ SOSY
130.0000 mg | PREFILLED_SYRINGE | Freq: Once | INTRAMUSCULAR | Status: AC
  Administered 2023-09-25: 130 mg via SUBCUTANEOUS

## 2023-09-25 NOTE — ED Triage Notes (Signed)
 Pt presents via POV c/o unilateral right lower extremity pain and swelling x5 days. Reports has seen PCP for same. Also reports took hydrochlorothiazide  at home without relief of swelling.

## 2023-09-25 NOTE — ED Provider Notes (Signed)
 [place] EMERGENCY DEPARTMENT AT [facility] Provider Note  CSN: [id number] Arrival date & time: 09/25/23 0037  Chief Complaint(s) Leg Swelling  HPI Sally [name] is a 48 y.o. female with a past medical history listed below who presents to the emergency department with several days of right lower leg swelling and discomfort.  Patient is concerned she has got a DVT.  No prior history of DVT/blood clots.  She denies any falls or trauma.  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   Patient reports that she has been bruising more frequently recently and went to hematology earlier this week or labs were obtained.  HPI  Past Medical History Past Medical History:  Diagnosis Date   Acute appendicitis 09/09/2012   Anxiety    Back pain    Occ. issues wih back pain   Back pain 09/09/2012   It is intermittent now,  previously source of fibromyalgia dx.     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)    CRPS (complex regional pain syndrome), lower limb 2018   Depression    Edema of both lower extremities    Fibromyalgia    GERD (gastroesophageal reflux disease)    Headache    Hypothyroid 09/09/2012   Hypothyroidism    Joint pain    Osteoarthritis    Palpitation    only when having anxiety   Precancerous changes of the cervix    Sleep apnea    pre lap banding-never used cpap or mask   SOB (shortness of breath)    Swallowing difficulty    Vitamin D  deficiency    Patient Active Problem List   Diagnosis Date Noted   Insulin  resistance 01/30/2022   Elevated blood pressure reading without diagnosis of hypertension 01/08/2022   Aphasia 01/08/2022   Vitamin D  deficiency 11/07/2021   Pre-diabetes 10/17/2021   Gastroesophageal reflux disease 06/04/2021   Morbid obesity (HCC) 06/04/2021   Depression 06/04/2021   Cervical disc herniation 10/27/2018   HTN  (hypertension) 09/18/2016   Physical exam 09/03/2016   Status post total right knee replacement 08/20/2016   Migraines 03/28/2016   Breast pain in female 01/28/2016   Obesity (BMI 30-39.9) 01/07/2016   Fibromyalgia 01/07/2016   Smoker 06/16/2015   Anxiety and depression 04/16/2015   Fecal incontinence 10/09/2013   Dyspareunia 10/09/2013   Stress incontinence 09/24/2013   Hypothyroid 09/09/2012   Back pain 09/09/2012   Lapband APS April 2008 11/20/2011   Pelvic pain in female 08/19/2010   Home Medication(s) Prior to Admission medications   Medication Sig Start Date End Date Taking? Authorizing Provider  ALPRAZolam (XANAX) 0.25 MG tablet Take 0.25 mg by mouth daily. 01/01/22   [provider]  Aspirin-Acetaminophen -Caffeine (EXCEDRIN MIGRAINE PO) Take by mouth. prn    [provider]  Buprenorphine HCl (BELBUCA) 150 MCG FILM Place 150 mg inside cheek at bedtime.    [provider]  buPROPion (WELLBUTRIN SR) 150 MG 12 hr tablet Take 150 mg by mouth daily. 12/25/21   [provider]  cyclobenzaprine  (FLEXERIL ) 10 MG tablet Take 1 tablet (10 mg total) by mouth 2 (two) times daily as needed for muscle spasms. 01/24/23   Tegeler, Lonni PARAS, MD  hydrochlorothiazide  (HYDRODIURIL ) 50 MG tablet Take by mouth as needed.    [provider]  hydroxypropyl methylcellulose / hypromellose (ISOPTO TEARS /  GONIOVISC) 2.5 % ophthalmic solution Place 1 drop into both eyes 3 (three) times daily as needed for dry eyes.    [provider]  ketorolac  (TORADOL ) 10 MG tablet Take 10 mg by mouth as needed.    [provider]  levothyroxine  (SYNTHROID ) 175 MCG tablet Take 175 mcg by mouth daily. 02/19/21   [provider]  lidocaine  (LIDODERM ) 5 % Place 1 patch onto the skin daily. Remove & Discard patch within 12 hours or as directed by MD 01/24/23   Tegeler, Lonni PARAS, MD  liothyronine  (CYTOMEL ) 5 MCG tablet Take 3 tablets (15 mcg total) by  mouth daily. Take 2 tablets at breakfast and take one tablet at lunch Patient taking differently: Take 5-10 mcg by mouth See admin instructions. Take 10 mcg by mouth in the morning and 5 mcg in the evening 09/17/16   [place], MD  loratadine  (CLARITIN ) 10 MG tablet Take 10 mg by mouth daily as needed. 12/24/16   [provider]  metFORMIN  (GLUCOPHAGE ) 500 MG tablet Take 1 tablet (500 mg total) by mouth 2 (two) times daily with a meal. 03/26/22   Rayburn, Elouise Phlegm, PA-C  methocarbamol  (ROBAXIN ) 500 MG tablet Take 500 mg by mouth every 8 (eight) hours as needed for muscle spasms.    [provider]  NONFORMULARY OR COMPOUNDED ITEM Estradiol  cream 0.02% in 1ml prefilled syringes.  1ml pv twice weekly.  #65month supply. Patient taking differently: Estradiol  cream 0.02% in 1ml prefilled syringes.  1ml pv twice weekly.  #53month supply. 09/29/18   Cleotilde Ronal RAMAN, MD  omeprazole (PRILOSEC) 40 MG capsule Take 40 mg by mouth daily.    [provider]  Oxycodone  HCl 10 MG TABS SMARTSIG:1 Tablet(s) By Mouth 6 Times Daily PRN 12/25/21   [provider]  pantoprazole  (PROTONIX ) 40 MG tablet Take 40 mg by mouth daily.    [provider]  polyethylene glycol (MIRALAX  / GLYCOLAX ) 17 g packet Take 17 g by mouth daily.    [provider]  Sennosides (SENOKOT EXTRA STRENGTH) 17.2 MG TABS Take 17.2 mg by mouth daily.    [provider]  SUMAtriptan (IMITREX) 25 MG tablet Take 25 mg by mouth at bedtime as needed for migraine. 08/09/18   [provider]  Testosterone 10 MG/ACT (2%) GEL Apply 1 Pump topically at bedtime. 09/06/18   [provider]  topiramate  (TOPAMAX ) 50 MG tablet Take 1 tablet (50 mg total) by mouth daily. 03/26/22   Rayburn, Elouise Phlegm, PA-C  venlafaxine  XR (EFFEXOR -XR) 75 MG 24 hr capsule Take 75 mg by mouth daily with breakfast.    [provider]  Vitamin D , Ergocalciferol , (DRISDOL ) 1.25 MG (50000 UNIT)  CAPS capsule TAKE ONE CAPSULE EVERY 7 DAYS 03/26/22   Rayburn, Elouise Phlegm, PA-C  Allergies Oxycodone -acetaminophen , Codeine, Duloxetine , Paroxetine hcl, Duloxetine  hcl, and Percocet [oxycodone -acetaminophen ]  Review of Systems Review of Systems As noted in HPI  Physical Exam Vital Signs  I have reviewed the triage vital signs BP (!) 133/99   Pulse 80   Temp 98.1 F (36.7 C) (Oral)   Resp 18   LMP 10/26/2015 (Exact Date)   SpO2 100%   Physical Exam Vitals reviewed.  Constitutional:      General: She is not in acute distress.    Appearance: She is well-developed. She is obese. She is not diaphoretic.  HENT:     Head: Normocephalic and atraumatic.     Right Ear: External ear normal.     Left Ear: External ear normal.     Nose: Nose normal.  Eyes:     General: No scleral icterus.    Conjunctiva/sclera: Conjunctivae normal.  Neck:     Trachea: Phonation normal.  Cardiovascular:     Rate and Rhythm: Normal rate and regular rhythm.     Pulses:          Dorsalis pedis pulses are 1+ on the right side and 1+ on the left side.  Pulmonary:     Effort: Pulmonary effort is normal. No respiratory distress.     Breath sounds: No stridor.  Abdominal:     General: There is no distension.  Musculoskeletal:        General: Normal range of motion.     Cervical back: Normal range of motion.     Right lower leg: Swelling and tenderness present.       Legs:  Neurological:     Mental Status: She is alert and oriented to person, place, and time.  Psychiatric:        Behavior: Behavior normal.     ED Results and Treatments Labs (all labs ordered are listed, but only abnormal results are displayed) Labs Reviewed - No data to display                                                                                                                        EKG  EKG Interpretation Date/Time:    Ventricular Rate:    PR Interval:    QRS Duration:    QT Interval:    QTC Calculation:   R Axis:      Text Interpretation:         Radiology No results found.  Medications Ordered in ED Medications  enoxaparin  (LOVENOX ) injection 130 mg (130 mg Subcutaneous Given 09/25/23 0214)   Procedures Procedures  (including critical care time) Medical Decision Making / ED Course   Medical Decision Making Amount and/or Complexity of Data Reviewed External Data Reviewed: labs. Radiology: ordered.  Risk Prescription drug management.    Right leg swelling and pain differential diagnosis considered.  Exam not concerning for infectious etiology such as cellulitis or deep tissue infection.  No signs of trauma requiring x-rays at this time.  Pulses intact and not  concerning for arterial occlusion.  Possible CRPS but will rule out DVT.  Given the time of night, unable to obtain ultrasound.  Will need to order ambulatory DVT study and have patient return  Labs from 3 days ago performed by hematology were evaluated and patient did not have evidence of anemia, thrombocytopenia, renal insufficiency.  She did have elevated von Willebrand's antigen and coagulation factor VIII favoring hypercoagulable/inflammatory process.  Treatment dose Lovenox  given.      Final Clinical Impression(s) / ED Diagnoses Final diagnoses:  Right leg swelling   The patient appears reasonably screened and/or stabilized for discharge and I doubt any other medical condition or other [facility] requiring further screening, evaluation, or treatment in the ED at this time. I have discussed the findings, Dx and Tx plan with the patient/family who expressed understanding and agree(s) with the plan. Discharge instructions discussed at length. The patient/family was given strict return precautions who verbalized understanding of the instructions. No further questions at time of  discharge.  Disposition: Discharge  Condition: Good  ED Discharge Orders          Ordered    Lower Ext Right Venous US        Comments: IMPORTANT PATIENT INSTRUCTIONS:  You have been scheduled for an Outpatient Ultrasound.    Your appointment has been scheduled for:  _______ am/pm on _______________ (date).  If your appointment is scheduled for a Saturday, Sunday or holiday, please go to the [employer] at [facility] Emergency Department Registration Desk at least 15 minutes prior to your appointment time and tell them you are there for an ultrasound.    If your appointment is scheduled for a weekday (Monday - Friday), please go directly to the [facility] at [facility] Radiology Department reception area at least 15 minutes prior to your appointment time and tell them you are there for an ultrasound.  Please call [phone] with questions.   09/25/23 0204              Follow Up: [facility] Emergency Department at [facility] [address] [place]  [id number] [phone] Today     This chart was dictated using voice recognition software.  Despite best efforts to proofread,  errors can occur which can change the documentation meaning.    Trine Raynell Moder, MD 09/25/23 [phone]

## 2023-09-30 ENCOUNTER — Inpatient Hospital Stay (HOSPITAL_BASED_OUTPATIENT_CLINIC_OR_DEPARTMENT_OTHER): Admitting: Hematology

## 2023-09-30 DIAGNOSIS — R233 Spontaneous ecchymoses: Secondary | ICD-10-CM | POA: Diagnosis not present

## 2023-10-05 LAB — VON WILLEBRAND FACTOR MULTIMER

## 2023-10-08 NOTE — Progress Notes (Signed)
 [facility]   HEMATOLOGY/ONCOLOGY PHONE VISIT NOTE  Date of Service: .09/30/2023  Patient Care Team: Leron Millman, NP as PCP - General (Nurse Practitioner)  CHIEF COMPLAINTS/PURPOSE OF CONSULTATION:  Easy brusability r/o bleeding order -- discussion of lab results  HISTORY OF PRESENTING ILLNESS:   Sally [name] is a wonderful 48 y.o. female who has been referred to us  by Millman Leron NP for evaluation and management of easy bruisability to r/o a bleeding disorder.  Patient has a history of anxiety, hypothyroidism, morbid obesity .There is no height or weight on file to calculate BMI.,  Myalgia, osteoarthritis, sleep apnea who notes that she has some increased episodes of bruising over her lower extremities upper extremities and at times the trunk.  She does not clearly recollect trauma leading to this bruising.  She notes there might sometimes get shoes that itch. No overt purpleish raised lesions to skin at this time. No family history of bleeding abnormality. No history of nosebleeds, gum bleeds, heavy menstrual cycles.  No previous history of hemarthrosis or intramuscular hematomas.  No history of GI or CNS bleeds. He has multiple surgical procedures in the past and has not had any issues with excessive bleeding or needing blood transfusions. No other significant new focal symptoms. Does have history of migraines and is using Excedrin containing aspirin on and off for headaches.  She was also on venlafaxine  for fibromyalgia/depression.  She also has a prescription and has been using Toradol  as needed for pain. 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  INTERVAL HISTORY  ..I connected with Doyal Alisa Nap on 09/30/2023 at  8:40 AM EDT by telephone visit and verified that I am speaking with the correct person using two identifiers.   I discussed the limitations, risks, security and privacy concerns of performing an evaluation and management service by telemedicine and  the availability of in-person appointments. I also discussed with the patient that there may be a patient responsible charge related to this service. The patient expressed understanding and agreed to proceed.   Other persons participating in the visit and their role in the encounter: none   Patient's location: Home  Provider's location: [facility]   Chief Complaint: Discussion of lab results for testing done for easy bruisability.  Patient was called and all the lab results were discussed in detail with her and her questions were answered. No other acute new bleeding since her last clinic visit.  MEDICAL HISTORY:  Past Medical History:  Diagnosis Date   Acute appendicitis 09/09/2012   Anxiety    Back pain    Occ. issues wih back pain   Back pain 09/09/2012   It is intermittent now,  previously source of fibromyalgia dx.     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)    CRPS (complex regional pain syndrome), lower limb 2018   Depression    Edema of both lower extremities    Fibromyalgia    GERD (gastroesophageal reflux disease)    Headache    Hypothyroid 09/09/2012   Hypothyroidism    Joint pain    Osteoarthritis    Palpitation    only when having anxiety   Precancerous changes of the cervix    Sleep apnea    pre lap banding-never used cpap or mask   SOB (shortness of breath)    Swallowing difficulty    Vitamin D  deficiency  SURGICAL HISTORY: Past Surgical History:  Procedure Laterality Date   ANTERIOR CERVICAL DECOMP/DISCECTOMY FUSION N/A 10/27/2018   Procedure: ANTERIOR CERVICAL DECOMPRESSION/DISCECTOMY CERVICAL FIVE-SIX, CERVICAL SIX-SEVEN;  Surgeon: Burnetta Aures, MD;  Location: [facility] OR;  Service: Orthopedics;  Laterality: N/A;   BILATERAL SALPINGECTOMY Bilateral 11/05/2015   Procedure: BILATERAL SALPINGECTOMY;  Surgeon: Ronal GORMAN Pinal, MD;  Location: [facility] ORS;  Service: Gynecology;  Laterality: Bilateral;   BRAIN SURGERY   1992   remove blood clot after a fall   CESAREAN SECTION  1997, 2013   cyst on ovary     CYSTOSCOPY N/A 11/05/2015   Procedure: CYSTOSCOPY;  Surgeon: Ronal GORMAN Pinal, MD;  Location: [facility];  Service: Gynecology;  Laterality: N/A;   GASTRIC BANDING PORT REVISION  01/20/2012   Procedure: GASTRIC BANDING PORT REVISION;  Surgeon: Donnice KATHEE Lunger, MD;  Location: [facility];  Service: General;  Laterality: N/A;   KNEE ARTHROSCOPY Left    KNEE ARTHROSCOPY WITH DRILLING/MICROFRACTURE Right 12/06/2014   Procedure: KNEE ARTHROSCOPY WITH DRILLING/MICROFRACTURE, CHONDROPLASTY, EXCISION OF PLICA;  Surgeon: Norleen Gavel, MD;  Location: [facility];  Service: Orthopedics;  Laterality: Right;   KNEE SURGERY Right 06/26/2016   [facility]   LAPAROSCOPIC APPENDECTOMY N/A 09/09/2012   Procedure: APPENDECTOMY LAPAROSCOPIC;  Surgeon: Deward GORMAN Curvin DOUGLAS, MD;  Location: [facility] OR;  Service: General;  Laterality: N/A;   LAPAROSCOPIC GASTRIC BANDING  1997   LAPAROSCOPIC HYSTERECTOMY N/A 11/05/2015   Procedure: HYSTERECTOMY TOTAL LAPAROSCOPIC;  Surgeon: Ronal GORMAN Pinal, MD;  Location: [facility];  Service: Gynecology;  Laterality: N/A;   LAPAROSCOPY  08/19/2010   Procedure: LAPAROSCOPY OPERATIVE;  Surgeon: CHRISTELLA Elvie Pinal;  Location: [facility];  Service: Gynecology;  Laterality: N/A;  with Biopsy of uterine serosa   TUBAL LIGATION  08/2011    SOCIAL HISTORY: Social History   Socioeconomic History   Marital status: Married    Spouse name: Gustav   Number of children: 2   Years of education: Not on file   Highest education level: Some college, no degree  Occupational History    Comment: disabled   Occupation: stay at home  Tobacco Use   Smoking status: Every Day    Current packs/day: 1.00    Average packs/day: 1 pack/day for 25.7 years (25.7 ttl pk-yrs)    Types: Cigarettes    Start date: 2000   Smokeless tobacco: Never  Vaping Use   Vaping status: Never Used  Substance and Sexual Activity    Alcohol use: Not Currently   Drug use: No   Sexual activity: Yes    Partners: Male    Birth control/protection: Surgical    Comment: TLH  Other Topics Concern   Not on file  Social History Narrative   Lives with husband, son   Caffeine - coffee 4 c daily   Social Drivers of Corporate investment banker Strain: Not on file  Food Insecurity: No Food Insecurity (09/22/2023)   Hunger Vital Sign    Worried About Running Out of Food in the Last Year: Never true    Ran Out of Food in the Last Year: Never true  Transportation Needs: No Transportation Needs (09/22/2023)   PRAPARE - Administrator, Civil Service (Medical): No    Lack of Transportation (Non-Medical): No  Physical Activity: Not on file  Stress: Not on file  Social Connections: Unknown (06/02/2021)   Received from [facility]   Social Network    Social Network: Not on  file  Intimate Partner Violence: Not At Risk (09/22/2023)   Humiliation, Afraid, Rape, and Kick questionnaire    Fear of Current or Ex-Partner: No    Emotionally Abused: No    Physically Abused: No    Sexually Abused: No    FAMILY HISTORY: Family History  Problem Relation Age of Onset   Hypertension Mother    Depression Mother    Anxiety disorder Mother    Obesity Mother    Autoimmune disease Sister    Hypertension Maternal Grandfather    Diabetes Paternal Grandmother    Anesthesia problems Neg Hx     ALLERGIES:  is allergic to oxycodone -acetaminophen , codeine, duloxetine , paroxetine hcl, duloxetine  hcl, and percocet [oxycodone -acetaminophen ].  MEDICATIONS:  Current Outpatient Medications  Medication Sig Dispense Refill   ALPRAZolam (XANAX) 0.25 MG tablet Take 0.25 mg by mouth daily.     Aspirin-Acetaminophen -Caffeine (EXCEDRIN MIGRAINE PO) Take by mouth. prn     Buprenorphine HCl (BELBUCA) 150 MCG FILM Place 150 mg inside cheek at bedtime.     buPROPion (WELLBUTRIN SR) 150 MG 12 hr tablet Take 150 mg by mouth daily.      cyclobenzaprine  (FLEXERIL ) 10 MG tablet Take 1 tablet (10 mg total) by mouth 2 (two) times daily as needed for muscle spasms. 20 tablet 0   hydrochlorothiazide  (HYDRODIURIL ) 50 MG tablet Take by mouth as needed.     hydroxypropyl methylcellulose / hypromellose (ISOPTO TEARS / GONIOVISC) 2.5 % ophthalmic solution Place 1 drop into both eyes 3 (three) times daily as needed for dry eyes.     ketorolac  (TORADOL ) 10 MG tablet Take 10 mg by mouth as needed.     levothyroxine  (SYNTHROID ) 175 MCG tablet Take 175 mcg by mouth daily.     lidocaine  (LIDODERM ) 5 % Place 1 patch onto the skin daily. Remove & Discard patch within 12 hours or as directed by MD 15 patch 0   liothyronine  (CYTOMEL ) 5 MCG tablet Take 3 tablets (15 mcg total) by mouth daily. Take 2 tablets at breakfast and take one tablet at lunch (Patient taking differently: Take 5-10 mcg by mouth See admin instructions. Take 10 mcg by mouth in the morning and 5 mcg in the evening) 270 tablet 1   loratadine  (CLARITIN ) 10 MG tablet Take 10 mg by mouth daily as needed.     metFORMIN  (GLUCOPHAGE ) 500 MG tablet Take 1 tablet (500 mg total) by mouth 2 (two) times daily with a meal. 120 tablet 0   methocarbamol  (ROBAXIN ) 500 MG tablet Take 500 mg by mouth every 8 (eight) hours as needed for muscle spasms.     NONFORMULARY OR COMPOUNDED ITEM Estradiol  cream 0.02% in 1ml prefilled syringes.  1ml pv twice weekly.  #84month supply. (Patient taking differently: Estradiol  cream 0.02% in 1ml prefilled syringes.  1ml pv twice weekly.  #77month supply.) 1 each 3   omeprazole (PRILOSEC) 40 MG capsule Take 40 mg by mouth daily.     Oxycodone  HCl 10 MG TABS SMARTSIG:1 Tablet(s) By Mouth 6 Times Daily PRN     pantoprazole  (PROTONIX ) 40 MG tablet Take 40 mg by mouth daily.     polyethylene glycol (MIRALAX  / GLYCOLAX ) 17 g packet Take 17 g by mouth daily.     Sennosides (SENOKOT EXTRA STRENGTH) 17.2 MG TABS Take 17.2 mg by mouth daily.     SUMAtriptan (IMITREX) 25 MG tablet  Take 25 mg by mouth at bedtime as needed for migraine.     Testosterone 10 MG/ACT (2%) GEL Apply 1  Pump topically at bedtime.     topiramate  (TOPAMAX ) 50 MG tablet Take 1 tablet (50 mg total) by mouth daily. 60 tablet 0   venlafaxine  XR (EFFEXOR -XR) 75 MG 24 hr capsule Take 75 mg by mouth daily with breakfast.     Vitamin D , Ergocalciferol , (DRISDOL ) 1.25 MG (50000 UNIT) CAPS capsule TAKE ONE CAPSULE EVERY 7 DAYS 8 capsule 0   No current facility-administered medications for this visit.    REVIEW OF SYSTEMS:   .10 Point review of Systems was done is negative except as noted above.  PHYSICAL EXAMINATION: Telemedicine visit  LABORATORY DATA:  I have reviewed the data as listed  .    Latest Ref Rng & Units 09/22/2023   12:28 PM 01/24/2023    8:54 PM 09/08/2021    6:53 PM  CBC  WBC 4.0 - 10.5 K/uL 9.4  8.3  8.1   Hemoglobin 12.0 - 15.0 g/dL 86.6  85.4  85.7   Hematocrit 36.0 - 46.0 % 40.0  44.9  43.6   Platelets 150 - 400 K/uL 266  260  256     .    Latest Ref Rng & Units 09/22/2023   12:28 PM 01/24/2023    9:35 PM 09/08/2021    6:53 PM  CMP  Glucose 70 - 99 mg/dL 86  887  89   BUN 6 - 20 mg/dL 16  11  13    Creatinine 0.44 - 1.00 mg/dL 9.32  9.29  9.26   Sodium 135 - 145 mmol/L 141  139  141   Potassium 3.5 - 5.1 mmol/L 3.8  4.1  4.1   Chloride 98 - 111 mmol/L 107  104  107   CO2 22 - 32 mmol/L 28  29  27    Calcium 8.9 - 10.3 mg/dL 8.8  9.1  9.0   Total Protein 6.5 - 8.1 g/dL 6.4  7.0    Total Bilirubin 0.0 - 1.2 mg/dL 0.3  0.3    Alkaline Phos 38 - 126 U/L 80  104    AST 15 - 41 U/L 15  14    ALT 0 - 44 U/L 23  16     Component     Latest Ref Rng 09/22/2023  IgG (Immunoglobin G), Serum     586 - 1,602 mg/dL 203   IgA     87 - 647 mg/dL 620 (H)   IgM (Immunoglobulin M), Srm     26 - 217 mg/dL 877   Total Protein ELP     6.0 - 8.5 g/dL 6.1 (C)  Albumin SerPl Elph-Mcnc     2.9 - 4.4 g/dL 3.2 (C)  Alpha 1     0.0 - 0.4 g/dL 0.2 (C)  Alpha2 Glob SerPl Elph-Mcnc     0.4  - 1.0 g/dL 0.8 (C)  B-Globulin SerPl Elph-Mcnc     0.7 - 1.3 g/dL 1.3 (C)  Gamma Glob SerPl Elph-Mcnc     0.4 - 1.8 g/dL 0.6 (C)  M Protein SerPl Elph-Mcnc     Not Observed g/dL Not Observed (C)  Globulin, Total     2.2 - 3.9 g/dL 2.9 (C)  Albumin/Glob SerPl     0.7 - 1.7  1.2 (C)  IFE 1 Comment ! (C)  Please Note (HCV): Comment (C)  Kappa free light chain     3.3 - 19.4 mg/L 17.0   Lambda free light chains     5.7 - 26.3 mg/L 15.5   Kappa, lambda  light chain ratio     0.26 - 1.65  1.10   Coagulation Factor VIII     56 - 140 % 202 (H)   Ristocetin Co-factor, Plasma     50 - 200 % 191   Von Willebrand Antigen, Plasma     50 - 200 % 207 (H)   PFA Interpretation -   Collagen / Epinephrine      0 - 193 seconds 173   Prothrombin Time     11.4 - 15.2 seconds 12.8   INR     0.8 - 1.2  0.9   Sed Rate     0 - 22 mm/hr 18   Von Willebrand Multimers Comment   Fibrinogen      210 - 475 mg/dL 649   APTT     24 - 36 seconds 26     Legend: (H) High ! Abnormal (C) Corrected     RADIOGRAPHIC STUDIES: I have personally reviewed the radiological images as listed and agreed with the findings in the report. Lower Ext Right Venous US  Result Date: 09/25/2023 CLINICAL DATA:  Leg swelling EXAM: Right LOWER EXTREMITY VENOUS DOPPLER ULTRASOUND TECHNIQUE: Gray-scale sonography with compression, as well as color and duplex ultrasound, were performed to evaluate the deep venous system(s) from the level of the common femoral vein through the popliteal and proximal calf veins. COMPARISON:  None Available. FINDINGS: VENOUS Normal compressibility of the common femoral, superficial femoral, and popliteal veins, as well as the visualized calf veins. Visualized portions of profunda femoral vein and great saphenous vein unremarkable. No filling defects to suggest DVT on grayscale or color Doppler imaging. Doppler waveforms show normal direction of venous flow, normal respiratory plasticity and response to  augmentation. Limited views of the contralateral common femoral vein are unremarkable. OTHER None. Limitations: none IMPRESSION: Negative. Electronically Signed   By: Megan  Zare M.D.   On: 09/25/2023 11:10    ASSESSMENT & PLAN:   48 year old female with history of hypothyroidism, CRPS, anxiety, fibromyalgia, migraines with  1) Easy bruisability Plan Patient's lab results from 09/22/2023 were discussed with her in details. CBC shows normal platelet counts of 266k normal hemoglobin and WBC count Platelet function assay showed normal platelet function Prothrombin time within normal limits at 12.8 APTT within normal limits at 26 Fibrinogen  350 Von Willebrand panel showed normal von Willebrand antigen and ristocetin cofactor activity as well as factor VIII Serum protein electrophoresis showed slight elevation in IgA level of 379 this is polyclonal on IFE.  Patient does not have any palpable purpura or any other symptoms suggestive of IgA vasculitis Serum kappa lambda free light chains within normal limits Elevated within normal limits at 18 We discussed that her lack of further concerning bleeding history and absence of excessive bleeding with previous surgeries and all her lab workup is unrevealing for primary bleeding disorder. Her intermittent skin bruising could be from local skin factors including skin rashes including possible vasculitis, skin changes related to thyroid  disease, changes in supporting tissue for cutaneous capillaries, the use of NSAIDs plus SSRI/SNRI. - If the patient has localized skin rash with secondary bruising then primary care then primary care physician may consult dermatology consult dermatology to consider skin biopsy to rule out vasculitis. - Minimize NSAID use. - Recommended to keep the skin well moisturized and use PPI later or reevaluating cream as opposed to shaving blade. If the patient were to have excessive bleeding with procedures in the next step would be to  refer her to [facility]  [facility] for platelet aggregation studies and also to rule out dysfibrinogenemia or abnormalities of the fibrinolytic pathway.  Follow-up Return to clinic with PCP  The total time spent in the appointment was 23 minutes*.  All of the patient's questions were answered with apparent satisfaction. The patient knows to call the clinic with any problems, questions or concerns.   Emaline Saran MD MS AAHIVMS [facility] Hematology/Oncology Physician [facility]  .*Total Encounter Time as defined by the Centers for Medicare and Medicaid Services includes, in addition to the face-to-face time of a patient visit (documented in the note above) non-face-to-face time: obtaining and reviewing outside history, ordering and reviewing medications, tests or procedures, care coordination (communications with other health care professionals or caregivers) and documentation in the medical record.

## 2023-10-09 ENCOUNTER — Emergency Department (HOSPITAL_COMMUNITY)
Admission: EM | Admit: 2023-10-09 | Discharge: 2023-10-10 | Disposition: A | Discharge status: Home / Self Care | Attending: Emergency Medicine | Admitting: Emergency Medicine

## 2023-10-09 ENCOUNTER — Encounter (HOSPITAL_COMMUNITY): Payer: Self-pay

## 2023-10-09 ENCOUNTER — Emergency Department (HOSPITAL_COMMUNITY)

## 2023-10-09 ENCOUNTER — Other Ambulatory Visit: Payer: Self-pay

## 2023-10-09 DIAGNOSIS — E039 Hypothyroidism, unspecified: Secondary | ICD-10-CM | POA: Diagnosis not present

## 2023-10-09 DIAGNOSIS — Z7982 Long term (current) use of aspirin: Secondary | ICD-10-CM | POA: Insufficient documentation

## 2023-10-09 DIAGNOSIS — Z5329 Procedure and treatment not carried out because of patient's decision for other reasons: Secondary | ICD-10-CM | POA: Insufficient documentation

## 2023-10-09 DIAGNOSIS — M7989 Other specified soft tissue disorders: Secondary | ICD-10-CM | POA: Diagnosis not present

## 2023-10-09 DIAGNOSIS — R002 Palpitations: Secondary | ICD-10-CM | POA: Insufficient documentation

## 2023-10-09 DIAGNOSIS — Z79890 Hormone replacement therapy (postmenopausal): Secondary | ICD-10-CM | POA: Insufficient documentation

## 2023-10-09 LAB — TROPONIN I (HIGH SENSITIVITY)
Troponin I (High Sensitivity): 4 ng/L (ref <18)
Troponin I (High Sensitivity): 4 ng/L (ref <18)

## 2023-10-09 LAB — CBC
HCT: 43.1 % (ref 36.0–46.0)
Hemoglobin: 14.1 g/dL (ref 12.0–15.0)
MCH: 32.6 pg (ref 26.0–34.0)
MCHC: 32.7 g/dL (ref 30.0–36.0)
MCV: 99.5 fL (ref 80.0–100.0)
Platelets: 261 K/uL (ref 150–400)
RBC: 4.33 MIL/uL (ref 3.87–5.11)
RDW: 12.9 % (ref 11.5–15.5)
WBC: 8.5 K/uL (ref 4.0–10.5)
nRBC: 0 % (ref 0.0–0.2)

## 2023-10-09 LAB — BASIC METABOLIC PANEL WITH GFR
Anion gap: 10 (ref 5–15)
BUN: 13 mg/dL (ref 6–20)
CO2: 26 mmol/L (ref 22–32)
Calcium: 8.8 mg/dL — ABNORMAL LOW (ref 8.9–10.3)
Chloride: 101 mmol/L (ref 98–111)
Creatinine, Ser: 0.85 mg/dL (ref 0.44–1.00)
GFR, Estimated: >60 mL/min (ref >60)
Glucose, Bld: 106 mg/dL — ABNORMAL HIGH (ref 70–99)
Potassium: 4.1 mmol/L (ref 3.5–5.1)
Sodium: 137 mmol/L (ref 135–145)

## 2023-10-09 NOTE — ED Triage Notes (Addendum)
 Pt came via POV D/T bilateral leg pain swelling and heart palpations and dizzy spells. Pt reports some bruising on left arm that appeared a few months ago however it has spread to the right arm. Pt also states she has been having a new onset of itching from head to toe, reports having a cardiologist appointment end of October but feels really bad. 6/10 pain a/ox4

## 2023-10-09 NOTE — ED Triage Notes (Signed)
 Pt here for palpations and dizziness since the last couple of weeks.  Denies CP and C/O SHOB. Pt states she is having bilateral leg swelling.

## 2023-10-10 ENCOUNTER — Ambulatory Visit (HOSPITAL_COMMUNITY)

## 2023-10-10 ENCOUNTER — Other Ambulatory Visit (HOSPITAL_COMMUNITY): Payer: Self-pay

## 2023-10-10 NOTE — ED Provider Notes (Addendum)
 [place] EMERGENCY DEPARTMENT AT [facility] Provider Note   CSN: [id number] Arrival date & time: 10/09/23  1756     Patient presents with: Palpitations   Sally [name] is a 48 y.o. female.   The history is provided by the patient.  Palpitations Palpitations quality:  Fast Timing:  Constant Progression:  Unchanged Chronicity:  New Context: not anxiety and not appetite suppressants   Relieved by:  Nothing Worsened by:  Nothing Ineffective treatments:  None tried Associated symptoms: no diaphoresis, no dizziness, no orthopnea, no PND, no shortness of breath, no syncope, no vomiting and no weakness   Risk factors: no diabetes mellitus   Patient with CRPS with ongoing leg swelling/ pain as well as palpitations.  She  reports negative DVT studies but ongoing symptoms.  Reports she cannot get in to see cardiology quickly to address the symptoms.  No fevers.      Past Medical History:  Diagnosis Date   Acute appendicitis 09/09/2012   Anxiety    Back pain    Occ. issues wih back pain   Back pain 09/09/2012   It is intermittent now,  previously source of fibromyalgia dx.     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 Authorizing Provider  ALPRAZolam (XANAX) 0.25 MG tablet Take 0.25 mg by mouth daily. 01/01/22   [provider]   Aspirin-Acetaminophen -Caffeine (EXCEDRIN MIGRAINE PO) Take by mouth. prn    [provider]  Buprenorphine HCl (BELBUCA) 150 MCG FILM Place 150 mg inside cheek at bedtime.    [provider]  buPROPion (WELLBUTRIN SR) 150 MG 12 hr tablet Take 150 mg by mouth daily. 12/25/21   [provider]  cyclobenzaprine  (FLEXERIL ) 10 MG tablet Take 1 tablet (10 mg total) by mouth 2 (two) times daily as needed for muscle spasms. 01/24/23   Tegeler, Lonni PARAS, MD  hydrochlorothiazide  (HYDRODIURIL ) 50 MG tablet Take by mouth as needed.    [provider]  hydroxypropyl methylcellulose / hypromellose (ISOPTO TEARS / GONIOVISC) 2.5 % ophthalmic solution Place 1 drop into both eyes 3 (three) times daily as needed for dry eyes.    [provider]  ketorolac  (TORADOL ) 10 MG tablet Take 10 mg by mouth as needed.    [provider]  levothyroxine  (SYNTHROID ) 175 MCG tablet Take 175 mcg by mouth daily. 02/19/21   [provider]  lidocaine  (LIDODERM ) 5 % Place 1 patch onto the skin daily. Remove & Discard patch within 12 hours or as directed by MD 01/24/23   Tegeler, Lonni PARAS, MD  liothyronine  (CYTOMEL ) 5 MCG tablet Take 3 tablets (15 mcg total) by mouth daily. Take 2 tablets at breakfast and take one tablet  at lunch Patient taking differently: Take 5-10 mcg by mouth See admin instructions. Take 10 mcg by mouth in the morning and 5 mcg in the evening 09/17/16   Von [place], MD  loratadine  (CLARITIN ) 10 MG tablet Take 10 mg by mouth daily as needed. 12/24/16   [provider]  metFORMIN  (GLUCOPHAGE ) 500 MG tablet Take 1 tablet (500 mg total) by mouth 2 (two) times daily with a meal. 03/26/22   Rayburn, Elouise Phlegm, PA-C  methocarbamol  (ROBAXIN ) 500 MG tablet Take 500 mg by mouth every 8 (eight) hours as needed for muscle spasms.    [provider]  NONFORMULARY OR COMPOUNDED ITEM Estradiol  cream 0.02% in 1ml prefilled syringes.  1ml pv  twice weekly.  #24month supply. Patient taking differently: Estradiol  cream 0.02% in 1ml prefilled syringes.  1ml pv twice weekly.  #70month supply. 09/29/18   Cleotilde Ronal RAMAN, MD  omeprazole (PRILOSEC) 40 MG capsule Take 40 mg by mouth daily.    [provider]  Oxycodone  HCl 10 MG TABS SMARTSIG:1 Tablet(s) By Mouth 6 Times Daily PRN 12/25/21   [provider]  pantoprazole  (PROTONIX ) 40 MG tablet Take 40 mg by mouth daily.    [provider]  polyethylene glycol (MIRALAX  / GLYCOLAX ) 17 g packet Take 17 g by mouth daily.    [provider]  Sennosides (SENOKOT EXTRA STRENGTH) 17.2 MG TABS Take 17.2 mg by mouth daily.    [provider]  SUMAtriptan (IMITREX) 25 MG tablet Take 25 mg by mouth at bedtime as needed for migraine. 08/09/18   [provider]  Testosterone 10 MG/ACT (2%) GEL Apply 1 Pump topically at bedtime. 09/06/18   [provider]  topiramate  (TOPAMAX ) 50 MG tablet Take 1 tablet (50 mg total) by mouth daily. 03/26/22   Rayburn, Elouise Phlegm, PA-C  venlafaxine  XR (EFFEXOR -XR) 75 MG 24 hr capsule Take 75 mg by mouth daily with breakfast.    [provider]  Vitamin D , Ergocalciferol , (DRISDOL ) 1.25 MG (50000 UNIT) CAPS capsule TAKE ONE CAPSULE EVERY 7 DAYS 03/26/22   Rayburn, Elouise Phlegm, PA-C    Allergies: Oxycodone -acetaminophen , Codeine, Duloxetine , Paroxetine hcl, Duloxetine  hcl, and Percocet [oxycodone -acetaminophen ]    Review of Systems  Constitutional:  Negative for diaphoresis.  Respiratory:  Negative for shortness of breath.   Cardiovascular:  Positive for palpitations. Negative for orthopnea, syncope and PND.  Gastrointestinal:  Negative for vomiting.  Neurological:  Negative for dizziness and weakness.  All other systems reviewed and are negative.   Updated Vital Signs BP 137/85 (BP Location: Right Arm)   Pulse 75   Temp (!) 97.5 F (36.4 C) (Oral)   Resp 20   Ht 5' 8 (1.727 m)   Wt 129.3 kg    LMP 10/26/2015 (Exact Date)   SpO2 100%   BMI 43.33 kg/m   Physical Exam Vitals and nursing note reviewed.  Constitutional:      General: She is not in acute distress.    Appearance: Normal appearance. She is well-developed.  HENT:     Head: Normocephalic and atraumatic.     Nose: Nose normal.  Eyes:     Pupils: Pupils are equal, round, and reactive to light.  Cardiovascular:     Rate and Rhythm: Normal rate and regular rhythm.     Pulses: Normal pulses.     Heart sounds: Normal heart sounds.  Pulmonary:     Effort: Pulmonary effort is normal. No respiratory distress.     Breath sounds: Normal  breath sounds.  Abdominal:     General: Bowel sounds are normal. There is no distension.     Palpations: Abdomen is soft.     Tenderness: There is no abdominal tenderness. There is no guarding or rebound.  Musculoskeletal:        General: No tenderness or signs of injury. Normal range of motion.     Cervical back: Normal range of motion and neck supple.     Comments: No pitting edema   Skin:    General: Skin is warm and dry.     Capillary Refill: Capillary refill takes less than 2 seconds.     Findings: No erythema or rash.  Neurological:     General: No focal deficit present.     Mental Status: She is alert and oriented to person, place, and time.     Deep Tendon Reflexes: Reflexes normal.  Psychiatric:        Mood and Affect: Mood normal.     (all labs ordered are listed, but only abnormal results are displayed) Results for orders placed or performed during the hospital encounter of 10/09/23  Basic metabolic panel   Collection Time: 10/09/23  6:44 PM  Result Value Ref Range   Sodium 137 135 - 145 mmol/L   Potassium 4.1 3.5 - 5.1 mmol/L   Chloride 101 98 - 111 mmol/L   CO2 26 22 - 32 mmol/L   Glucose, Bld 106 (H) 70 - 99 mg/dL   BUN 13 6 - 20 mg/dL   Creatinine, Ser 9.14 0.44 - 1.00 mg/dL   Calcium 8.8 (L) 8.9 - 10.3 mg/dL   GFR, Estimated >39 >39 mL/min   Anion gap  10 5 - 15  CBC   Collection Time: 10/09/23  6:44 PM  Result Value Ref Range   WBC 8.5 4.0 - 10.5 K/uL   RBC 4.33 3.87 - 5.11 MIL/uL   Hemoglobin 14.1 12.0 - 15.0 g/dL   HCT 56.8 63.9 - 53.9 %   MCV 99.5 80.0 - 100.0 fL   MCH 32.6 26.0 - 34.0 pg   MCHC 32.7 30.0 - 36.0 g/dL   RDW 87.0 88.4 - 84.4 %   Platelets 261 150 - 400 K/uL   nRBC 0.0 0.0 - 0.2 %  Troponin I (High Sensitivity)   Collection Time: 10/09/23  6:44 PM  Result Value Ref Range   Troponin I (High Sensitivity) 4 <18 ng/L  Troponin I (High Sensitivity)   Collection Time: 10/09/23  9:40 PM  Result Value Ref Range   Troponin I (High Sensitivity) 4 <18 ng/L   DG Chest 2 View Result Date: 10/09/2023 CLINICAL DATA:  Chest pain EXAM: CHEST - 2 VIEW COMPARISON:  09/08/2021 FINDINGS: Frontal and lateral views of the chest demonstrate an unremarkable cardiac silhouette. No acute airspace disease, effusion, or pneumothorax. No acute bony abnormalities. Postsurgical changes from lap band procedure. IMPRESSION: 1. No acute intrathoracic process. Electronically Signed   By: Ozell Daring M.D.   On: 10/09/2023 18:59   Lower Ext Right Venous US  Result Date: 09/25/2023 CLINICAL DATA:  Leg swelling EXAM: Right LOWER EXTREMITY VENOUS DOPPLER ULTRASOUND TECHNIQUE: Gray-scale sonography with compression, as well as color and duplex ultrasound, were performed to evaluate the deep venous system(s) from the level of the common femoral vein through the popliteal and proximal calf veins. COMPARISON:  None Available. FINDINGS: VENOUS Normal compressibility of the common femoral, superficial femoral, and popliteal veins, as well as the visualized calf veins. Visualized portions of profunda  femoral vein and great saphenous vein unremarkable. No filling defects to suggest DVT on grayscale or color Doppler imaging. Doppler waveforms show normal direction of venous flow, normal respiratory plasticity and response to augmentation. Limited views of the  contralateral common femoral vein are unremarkable. OTHER None. Limitations: none IMPRESSION: Negative. Electronically Signed   By: Megan  Zare M.D.   On: 09/25/2023 11:10    EKG: None  Radiology: DG Chest 2 View Result Date: 10/09/2023 CLINICAL DATA:  Chest pain EXAM: CHEST - 2 VIEW COMPARISON:  09/08/2021 FINDINGS: Frontal and lateral views of the chest demonstrate an unremarkable cardiac silhouette. No acute airspace disease, effusion, or pneumothorax. No acute bony abnormalities. Postsurgical changes from lap band procedure. IMPRESSION: 1. No acute intrathoracic process. Electronically Signed   By: Ozell Daring M.D.   On: 10/09/2023 18:59     Procedures   Medications Ordered in the ED - No data to display                                  Medical Decision Making Patient with leg swelling and palpitations ruled out for DVT  Amount and/or Complexity of Data Reviewed Independent Historian:     Details: Family member  External Data Reviewed: notes.    Details: Previous notes reviewed  Labs: ordered.    Details: 2 negative troponins 4/4.  Normal sodium 137, normal potassium 4.1, normal creatinine normal white count 8.5, normal hemoglobin 14.1  Radiology: ordered and independent interpretation performed.    Details: Negative CXR ECG/medicine tests: ordered and independent interpretation performed. Decision-making details documented in ED Course.  Risk Risk Details: I have discussed with patient need for CTA given tachycardia, palpitations and SOB.  Patient then wanted to see me again to discuss this study.  I have gone back to the room with nurse to discuss the CTA and need for this test again and patient is now amenable to this test.  I was then informed by nurse that the patient is declining the test and wants to go home.  Patient is AO4 and has decision making capacity to refuse testing.  She will leave against medical advise     Final diagnoses:  Palpitations   Patient  declined CTA and is leaving [facility]  ED Discharge Orders          Ordered    Compression stockings        10/10/23 0029                 Lonita Debes, MD 10/10/23 9945

## 2023-10-10 NOTE — ED Notes (Signed)
 PT left with her sister AMA. PT refused CT at this time because she was feeling better and stated her labs looked well. Physician is aware.

## 2023-10-19 NOTE — Telephone Encounter (Signed)
 error

## 2023-10-26 ENCOUNTER — Emergency Department (HOSPITAL_COMMUNITY)
Admission: EM | Admit: 2023-10-26 | Discharge: 2023-10-26 | Disposition: A | Discharge status: Home / Self Care | Attending: Emergency Medicine | Admitting: Emergency Medicine

## 2023-10-26 ENCOUNTER — Emergency Department (HOSPITAL_COMMUNITY)

## 2023-10-26 ENCOUNTER — Encounter (HOSPITAL_COMMUNITY): Payer: Self-pay

## 2023-10-26 ENCOUNTER — Other Ambulatory Visit: Payer: Self-pay

## 2023-10-26 DIAGNOSIS — Z7982 Long term (current) use of aspirin: Secondary | ICD-10-CM | POA: Insufficient documentation

## 2023-10-26 DIAGNOSIS — M79662 Pain in left lower leg: Secondary | ICD-10-CM | POA: Insufficient documentation

## 2023-10-26 DIAGNOSIS — Z79899 Other long term (current) drug therapy: Secondary | ICD-10-CM | POA: Diagnosis not present

## 2023-10-26 DIAGNOSIS — M79605 Pain in left leg: Secondary | ICD-10-CM | POA: Insufficient documentation

## 2023-10-26 LAB — CBC
HCT: 43.6 % (ref 36.0–46.0)
Hemoglobin: 13.6 g/dL (ref 12.0–15.0)
MCH: 31.6 pg (ref 26.0–34.0)
MCHC: 31.2 g/dL (ref 30.0–36.0)
MCV: 101.4 fL — ABNORMAL HIGH (ref 80.0–100.0)
Platelets: 270 K/uL (ref 150–400)
RBC: 4.3 MIL/uL (ref 3.87–5.11)
RDW: 13 % (ref 11.5–15.5)
WBC: 9.4 K/uL (ref 4.0–10.5)
nRBC: 0 % (ref 0.0–0.2)

## 2023-10-26 LAB — BASIC METABOLIC PANEL WITH GFR
Anion gap: 9 (ref 5–15)
BUN: 13 mg/dL (ref 6–20)
CO2: 23 mmol/L (ref 22–32)
Calcium: 9 mg/dL (ref 8.9–10.3)
Chloride: 108 mmol/L (ref 98–111)
Creatinine, Ser: 0.76 mg/dL (ref 0.44–1.00)
GFR, Estimated: >60 mL/min (ref >60)
Glucose, Bld: 89 mg/dL (ref 70–99)
Potassium: 4.5 mmol/L (ref 3.5–5.1)
Sodium: 140 mmol/L (ref 135–145)

## 2023-10-26 MED ORDER — ENOXAPARIN SODIUM 60 MG/0.6ML IJ SOSY: 60.0000 mg | PREFILLED_SYRINGE | Freq: Once | INTRAMUSCULAR | Status: DC

## 2023-10-26 MED ORDER — KETOROLAC TROMETHAMINE 15 MG/ML IJ SOLN
15.0000 mg | Freq: Once | INTRAMUSCULAR | Status: AC
Start: 2023-10-26 — End: 2023-10-26
  Administered 2023-10-26: 15 mg via INTRAMUSCULAR
  Filled 2023-10-26: qty 1

## 2023-10-26 MED ORDER — ENOXAPARIN SODIUM 150 MG/ML IJ SOSY
130.0000 mg | PREFILLED_SYRINGE | Freq: Once | INTRAMUSCULAR | Status: AC
  Administered 2023-10-26: 130 mg via SUBCUTANEOUS
  Filled 2023-10-26: qty 0.86

## 2023-10-26 NOTE — ED Provider Notes (Signed)
 [place] EMERGENCY DEPARTMENT AT [facility] Provider Note   CSN: [id number] Arrival date & time: 10/26/23  1629     Patient presents with: Leg Pain   Sally [name] is a 48 y.o. female with past medical history significant for fibromyalgia, CRPS, obesity who presents concern for acute left leg pain since this morning.  She reports that it feels sharp, 10/10, like needles in her foot.  She reports it feels swollen, but has not noticed any significant swelling.  She reports that she had had similar pain in her right foot for months.  She reports that she had workup including ultrasound for blood clot which was negative, orthopedics is unsure what is wrong, thinks that may be related to her plantar fascia.     Leg Pain      Prior to Admission medications   Medication Sig Start Date End Date Taking? Authorizing Provider  ALPRAZolam (XANAX) 0.25 MG tablet Take 0.25 mg by mouth daily. 01/01/22   [provider]  Aspirin-Acetaminophen -Caffeine (EXCEDRIN MIGRAINE PO) Take by mouth. prn    [provider]  Buprenorphine HCl (BELBUCA) 150 MCG FILM Place 150 mg inside cheek at bedtime.    [provider]  buPROPion (WELLBUTRIN SR) 150 MG 12 hr tablet Take 150 mg by mouth daily. 12/25/21   [provider]  cyclobenzaprine  (FLEXERIL ) 10 MG tablet Take 1 tablet (10 mg total) by mouth 2 (two) times daily as needed for muscle spasms. 01/24/23   Tegeler, Lonni PARAS, MD  hydrochlorothiazide  (HYDRODIURIL ) 50 MG tablet Take by mouth as needed.    [provider]  hydroxypropyl methylcellulose / hypromellose (ISOPTO TEARS / GONIOVISC) 2.5 % ophthalmic solution Place 1 drop into both eyes 3 (three) times daily as needed for dry eyes.    [provider]  ketorolac  (TORADOL ) 10 MG tablet Take 10 mg by mouth as needed.    [provider]  levothyroxine  (SYNTHROID ) 175 MCG tablet Take 175 mcg by mouth daily. 02/19/21   [provider]  lidocaine  (LIDODERM ) 5 % Place 1 patch onto the skin daily. Remove & Discard patch within 12 hours or as directed by MD 01/24/23   Tegeler, Lonni PARAS, MD  liothyronine  (CYTOMEL ) 5 MCG tablet Take 3 tablets (15 mcg total) by mouth daily. Take 2 tablets at breakfast and take one tablet at lunch Patient taking differently: Take 5-10 mcg by mouth See admin instructions. Take 10 mcg by mouth in the morning and 5 mcg in the evening 09/17/16   Von [place], MD  loratadine  (CLARITIN ) 10 MG tablet Take 10 mg by mouth daily as needed. 12/24/16   [provider]  metFORMIN  (GLUCOPHAGE ) 500 MG tablet Take 1 tablet (500 mg total) by mouth 2 (two) times daily with a meal. 03/26/22   Rayburn, Elouise Phlegm, PA-C  methocarbamol  (ROBAXIN ) 500 MG tablet Take 500 mg by mouth every 8 (eight) hours as needed for muscle spasms.    [provider]  NONFORMULARY OR COMPOUNDED ITEM Estradiol  cream 0.02% in 1ml prefilled syringes.  1ml pv twice weekly.  #41month supply. Patient taking differently: Estradiol  cream 0.02% in 1ml prefilled syringes.  1ml pv twice weekly.  #27month supply. 09/29/18   Cleotilde Ronal RAMAN, MD  omeprazole (PRILOSEC) 40 MG capsule Take 40 mg by mouth daily.    [provider]  Oxycodone  HCl 10 MG TABS SMARTSIG:1 Tablet(s) By Mouth 6 Times Daily PRN 12/25/21   [provider]  pantoprazole  (PROTONIX ) 40 MG  tablet Take 40 mg by mouth daily.    [provider]  polyethylene glycol (MIRALAX  / GLYCOLAX ) 17 g packet Take 17 g by mouth daily.    [provider]  Sennosides (SENOKOT EXTRA STRENGTH) 17.2 MG TABS Take 17.2 mg by mouth daily.    [provider]  SUMAtriptan (IMITREX) 25 MG tablet Take 25 mg by mouth at bedtime as needed for migraine. 08/09/18   [provider]  Testosterone 10 MG/ACT (2%) GEL Apply 1 Pump topically at bedtime. 09/06/18   [provider]  topiramate  (TOPAMAX ) 50 MG tablet Take 1 tablet (50 mg  total) by mouth daily. 03/26/22   Rayburn, Elouise Phlegm, PA-C  venlafaxine  XR (EFFEXOR -XR) 75 MG 24 hr capsule Take 75 mg by mouth daily with breakfast.    [provider]  Vitamin D , Ergocalciferol , (DRISDOL ) 1.25 MG (50000 UNIT) CAPS capsule TAKE ONE CAPSULE EVERY 7 DAYS 03/26/22   Rayburn, Elouise Phlegm, PA-C    Allergies: Oxycodone -acetaminophen , Codeine, Duloxetine , Paroxetine hcl, Duloxetine  hcl, and Percocet [oxycodone -acetaminophen ]    Review of Systems  All other systems reviewed and are negative.   Updated Vital Signs BP (!) 155/118 (BP Location: Left Arm)   Pulse 82   Temp 99.1 F (37.3 C) (Oral)   Resp 18   Ht 5' 8 (1.727 m)   Wt 131.5 kg   LMP 10/26/2015 (Exact Date)   SpO2 96%   BMI 44.09 kg/m   Physical Exam Vitals and nursing note reviewed.  Constitutional:      General: She is not in acute distress.    Appearance: Normal appearance.  HENT:     Head: Normocephalic and atraumatic.  Eyes:     General:        Right eye: No discharge.        Left eye: No discharge.  Cardiovascular:     Rate and Rhythm: Normal rate and regular rhythm.     Pulses: Normal pulses.     Comments: DP, PT pulses are 2+ in the affected left lower extremity.  Capillary refills intact, color is normal, no soft tissue swelling noted, no blanching Pulmonary:     Effort: Pulmonary effort is normal. No respiratory distress.  Musculoskeletal:        General: No deformity.     Comments: Normal range of motion to flexion, extension of left ankle. No stepoff, deformity.  Skin:    General: Skin is warm and dry.     Capillary Refill: Capillary refill takes less than 2 seconds.  Neurological:     Mental Status: She is alert and oriented to person, place, and time.  Psychiatric:        Mood and Affect: Mood normal.        Behavior: Behavior normal.     (all labs ordered are listed, but only abnormal results are displayed) Labs Reviewed  CBC - Abnormal; Notable for the  following components:      Result Value   MCV 101.4 (*)    All other components within normal limits  BASIC METABOLIC PANEL WITH GFR    EKG: None  Radiology: DG Ankle Complete Left Result Date: 10/26/2023 CLINICAL DATA:  Left leg pain since this morning with swelling. EXAM: LEFT ANKLE COMPLETE - 3+ VIEW COMPARISON:  09/08/2021 FINDINGS: There is no evidence of fracture, dislocation, or joint effusion. There is no evidence of arthropathy or other focal bone abnormality. Soft tissues are unremarkable. IMPRESSION: Negative. Electronically Signed   By: Elsie  Mannie M.D.   On: 10/26/2023 18:02     Procedures   Medications Ordered in the ED  enoxaparin  (LOVENOX ) injection 130 mg (has no administration in time range)  ketorolac  (TORADOL ) 15 MG/ML injection 15 mg (15 mg Intramuscular Given 10/26/23 1949)                                    Medical Decision Making Amount and/or Complexity of Data Reviewed Labs: ordered. Radiology: ordered.  Risk Prescription drug management.   This patient is a 48 y.o. female who presents to the ED for concern of left leg pain, swelling.   Differential diagnoses prior to evaluation: Blood clot, sprain, strain, fracture, dislocation, gout   Past Medical History / Social History / Additional history: Chart reviewed. Pertinent results include: CRPS, fibromyalgia, obesity  Physical Exam: Physical exam performed. The pertinent findings include: DP, PT pulses are 2+ in the affected left lower extremity.  Capillary refills intact, color is normal, no soft tissue swelling noted, no blanching Normal range of motion to flexion, extension of left ankle. No stepoff, deformity.   I Independently interpreted CBC which is unremarkable, BMP which is unremarkable.  Plain film radiograph of the left ankle which shows no evidence of acute fracture, dislocation, or other abnormality.  I agree with the radiologist interpretation.  Medications / Treatment: Given  dose of Toradol  for pain, Lovenox  for DVT treatment given that there is no vascular ultrasound here today  Overall patient is completely neurovascularly intact, clinically I have fairly low suspicion for DVT based on location of the pain, swelling, but would want a vascular ultrasound to ensure no possibility of clot given no other obvious cause.  May be related to her known fibromyalgia, or developing CRPS, but overall with a neurovascularly intact benign appearing leg on exam, unclear cause for her pain.  She has some chronic pain and has narcotics at home, encouraged her home narcotic pain medication, orthopedic follow-up if DVT study tomorrow does not show a blood clot or acute cause for her pain   Disposition: After consideration of the diagnostic results and the patients response to treatment, I feel that patient stable for discharge with plan as above, ultrasound ordered and follow-up.   emergency department workup does not suggest an emergent condition requiring admission or immediate intervention beyond what has been performed at this time. The plan is: as above. The patient is safe for discharge and has been instructed to return immediately for worsening symptoms, change in symptoms or any other concerns.   Final diagnoses:  Left leg pain  Pain of left calf    ED Discharge Orders          Ordered    UE VENOUS DUPLEX        10/26/23 2007               Rosan Sherlean VEAR DEVONNA 10/26/23 2016    Geraldene Hamilton, MD 10/29/23 1858

## 2023-10-26 NOTE — Discharge Instructions (Addendum)
 Please use Tylenol  or ibuprofen  for pain.  You may use 600 mg ibuprofen  every 6 hours or 1000 mg of Tylenol  every 6 hours.  You may choose to alternate between the 2.  This would be most effective.  Not to exceed 4 g of Tylenol  within 24 hours.  Not to exceed 3200 mg ibuprofen  24 hours.  You can your home narcotic pain medication in place of Tylenol  for severe breakthrough pain.  Please return in the morning for ultrasound, and follow-up with orthopedic physician for further evaluation and management.

## 2023-10-26 NOTE — ED Triage Notes (Addendum)
 Patient has had left leg pain since this morning that worsens around her left ankle. Feels swollen. Ambulatory but it feels like needles in her foot. No history of blood clots.

## 2023-10-26 NOTE — ED Provider Notes (Signed)
 Patient with acute onset of left foot pain.  Patient had similar difficulties in the right leg in the past.  Seen by Ortho has had Doppler study done without any acute findings.  Ortho maybe a plantar fasciitis patient still followed by pain management.  They thought it was a pain related syndrome.  But this pain in the left foot is brand-new.  Started this morning and is severe.  No fall or injury.  CBC is normal x-ray of the ankle was negative.  On my exam patient's dorsalis pedis pulse is 2+ and the posterior tib pulse is 1+.  With excellent cap refill in all the toes.  Foot is not cold.  No significant swelling.  Calf nontender.  Doppler studies to rule out DVT currently not available patient did have Doppler study of her right leg with similar symptoms and that was negative we will do the Doppler study on the left leg as an outpatient have her follow-up with Ortho and pain management.  Patient has chronic pain medicines at home oxycodone .  And patient given Toradol  here.  Do not feel that patient needs a shot of Lovenox .  Clinically low suspicion for DVT but I think it needs to be done to be complete.  Will arrange it as an outpatient for tomorrow since they have already gone home today   Gaither Biehn, MD 10/26/23 2018

## 2023-10-27 ENCOUNTER — Ambulatory Visit (HOSPITAL_COMMUNITY)
Admission: RE | Admit: 2023-10-27 | Discharge: 2023-10-27 | Disposition: A | Discharge status: Home / Self Care | Source: Ambulatory Visit | Attending: Emergency Medicine | Admitting: Emergency Medicine

## 2023-10-27 DIAGNOSIS — M79605 Pain in left leg: Secondary | ICD-10-CM | POA: Diagnosis present

## 2023-10-27 DIAGNOSIS — M7989 Other specified soft tissue disorders: Secondary | ICD-10-CM

## 2023-10-27 DIAGNOSIS — I82812 Embolism and thrombosis of superficial veins of left lower extremities: Secondary | ICD-10-CM | POA: Diagnosis not present

## 2023-10-29 ENCOUNTER — Encounter: Payer: Medicare Other | Admitting: Psychology

## 2023-11-03 ENCOUNTER — Encounter: Attending: Psychology | Admitting: Psychology

## 2023-11-03 DIAGNOSIS — G894 Chronic pain syndrome: Secondary | ICD-10-CM | POA: Insufficient documentation

## 2023-11-03 DIAGNOSIS — R4189 Other symptoms and signs involving cognitive functions and awareness: Secondary | ICD-10-CM | POA: Diagnosis not present

## 2023-11-03 DIAGNOSIS — F32A Depression, unspecified: Secondary | ICD-10-CM | POA: Diagnosis present

## 2023-11-03 DIAGNOSIS — G90513 Complex regional pain syndrome I of upper limb, bilateral: Secondary | ICD-10-CM | POA: Insufficient documentation

## 2023-11-03 DIAGNOSIS — F419 Anxiety disorder, unspecified: Secondary | ICD-10-CM | POA: Diagnosis not present

## 2023-11-03 DIAGNOSIS — M5010 Cervical disc disorder with radiculopathy, unspecified cervical region: Secondary | ICD-10-CM | POA: Insufficient documentation

## 2023-11-04 ENCOUNTER — Encounter: Payer: Self-pay | Admitting: Psychology

## 2023-11-04 NOTE — Progress Notes (Signed)
 Patient:  Sally [name]   DOB: April 21, 1975  MR Number: [id number]  Location: [facility] [address], [address] [place] [address] Dept: [phone]  Start: 9 AM End: 10 AM  .  Today's visit was an in person visit conducted in outpatient clinic office with the patient myself present.   Provider/Observer:     Norleen JONELLE Asa PsyD  Chief Complaint:      Chief Complaint  Patient presents with   Pain   Anxiety   Depression   Fatigue   Sleeping Problem    Reason For Service:     Sally [name] is a 48 year old female referred by [facility] medical for therapeutic interventions due to chronic pain symptoms.  The patient reports that she has great difficulty standing, walking for long times, elbow pain, migraines, hip pain, shoulder and neck pain.  The patient fell at work and fractured both her elbows and also had knee replacement surgery.  The patient has been taking maintenance doses of opioid pain medications for some time.  The patient reports that she has a great deal of difficulty functioning and has very poor sleep and wakes up numerous times each night.  The patient also has dealt with depression and anxiety for many years and has been hospitalized for depression anxiety years ago.  She has been receiving counseling for anxiety and depression and takes Trintellix.   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   The patient describes chronic issues of depression anxiety but the development of severe chronic pain.  The patient reports that she is continuing to have  some pain in her back post surgery and had a recent fall.  Follow-up MRI did not suggest any structural damage or injury from this fall.  The patient reports that her symptoms associated with complex regional pain syndrome have worsened recently.  11/01/2019: The patient is continued to have significant pain and distress with worsening episodes of her complex regional pain syndrome and fibromyalgia type symptoms.  The patient has been very frustrated at times with her lack of ability to function and do daily activities.  01/03/2020: The patient reports that she is continued to have significant pain but has particularly had worsening of her fatigue and somnolence.  She reports that she feels like she is sleeping an adequate amount of time but is very tired and fatigued throughout the day.  02/28/2020: The patient reports that she has had some worsening in her significant pain symptoms with cold and atmospheric changes.  She continues to have significant issues with her upper extremities related to complex regional pain syndrome and fibromyalgia.  The patient also reports that she has had significant psychosocial stressors with her closest friend having terminal cancer and being given 3 months to live.  The patient and her friend are talking about the patient taking over care of the patient's granddaughter and the patient also feels a great deal of need to do what ever she can for her friend going forward.  This is a very difficult and stressful situation for the patient on top of her numerous medical  and pain issues.  The patient reports that her sleep continues to be improved but limited with pain disrupting sleep patterns.  The patient has been followed by Dr. Ema MD [facility]) for some time for pain management and she is now been referred for an evaluation for spinal cord stimulator trialing and possible implantation as well as being scheduled for ketamine  infusions.  They are still working on  Event organiser. coverage for the ketamine  infusions.  I think that the patient is an excellent candidate for both of these possible options.  The patient and I talked about the appropriateness for ketamine  infusions due to her wide spectrum of issues for some time now and I am quite pleased to find out that her treating pain specialist's clinic is now doing ketamine  infusions that she appears to be an excellent candidate.  She also appears to be an excellent candidate for spinal cord stimulator trialing and possible implantation.  03/27/2020: The patient reports that she has gone through all of the preliminary work-up to have a spinal cord stimulator trial conducted and is looking forward with hope that it will help with her significant cervical related pain.  The patient is also has an initial appointment to assess for possible ketamine  infusion therapies as well.  We reviewed issues related to both the spinal cord stimulator and ketamine  infusion with the patient as well as worked on therapeutic interventions around her chronic pain/complex regional pain syndrome diagnoses.  The patient reports that she still has significant psychosocial stressors with her closest friend dying from breast cancer and in the last month or 2 of her life.  At this point, the patient does not look like she will have to take over her friend's granddaughters care as the family is hoping that an uncle of the young girl will take over care.  04/24/2020: The patient returns reporting that she has gotten her spinal cord stimulator trialing device and has it implanted currently.  However, there have been mechanical problems with the device and it has been shutting off independently and the patient has not been able to get a good reading on how much it has helped her.  The patient reports that she did experience some brief improvement when she knew that it was on but it would turn off.  She is gone back to the device maker and  they could not figure out the problem.  They are going to put a new external device in and extend the spinal cord stimulator trial in face for 2 days.  The patient has also had her first ketamine  infusion trial.  She reports that she was told she was hallucinating and dissociative during this phase and experienced some improvement over the rest of the day as far as her mood and pain symptoms but it was over within 24 hours.  The patient is scheduled to have her next ketamine  infusion coming up.  08/07/2020: The patient reports that they are looking at doing a new spinal cord stimulator trial as the device did not work properly during her 7-day trial.  In fact, they attempted to extended to 10 days hoping to get it working correctly.  However, the patient reports that she felt like it did help the first day when it was apparently working but shortly stopped working and it sounds like there were never able to get it working properly.  The patient reports that at this point, her anesthesiologist/pain specialist does not think that she  should have any more ketamine  trials but the patient would like to try them again.  The patient reports that the doctor felt that she had an adverse reaction to it but her husband was present and did not describe any severe reaction.  At this point, she is planning to talk to him again about possibly having another ketamine  infusion if possible.  The patient reports that she continues with her significant chronic pain symptoms but she is managing day-to-day.  She reports that there continues to be a major stressor in her life relative to a very close friend who has terminal cancer and the cancer is continuing to progress.  09/11/2020: The patient reports that her friend is now being managed by hospice and appears to be at the end of her life.  This has been stressful but the patient has been able to adjust to the impending reality with time and she feels like she is handling it fairly  well.  The patient reports that she continues to have significant pain in her arms radiating from her neck.  The patient reports that at this point she has not been able to return for ketamine  therapies and not discussed strategies to follow-up with [facility].  11/06/2020: The patient has continued to have a lot of stress with depression and anxiety worsening particular around the impending death of a very close friend.  The friend is dying from metastatic cancer and the patient is one of the primary helpers for her friend.  The patient is very close to this friend.  The patient interacts with hospice and is spending a lot of time helping.  This is been coinciding with a worsening of her cervical pain radiating down her right arm primarily.  She continues to struggle with severe pain and the depression and anxiety associated around all of these issues has been worse.  12/11/2020: The patient reports that she has had a very stressful time recently.  Her close friend has passed away and while her friend passed away peacefully and was in the care of hospice at the end of her life there is [employer] shit show happening around her friend's death.  The patient reports that the friends son who has been very active in the friend's life tragically died just a couple of days after the friend's death in a motor vehicle accident.  The son who had had little interaction with her friend did show up and there was conflict between the friend's new husband and the son regarding ownership of some of the property.  The friend had completed an extensive well that gave all of her estate to the grandchildren but there was still a great deal of arguing.  The husband had lifetime rights to the house for now.  The patient reports that the death of her friend's son happened before the funeral and so they ended up having a joint funeral for the son and the patient's friend.  The patient reports that her depression has been  very severe and the fact that she is the executor for her friend's estate has had a great deal of stress.  Her pain continues to be very severe.  05/02/2021:  Patient continues to struggle with pain and has not returned back to [facility] yet for any interventions.  They are still working on plan.  Patient still with stress over friends death and the fall out over friends grandchild etc.  Still a very complicated situation.  08/05/2021: The patient  reports that she has had a lot on her plate since I saw her last in April.  The patient reports that she now has her friend's great-granddaughter living permanently with her and is becoming part of her family.  This young lady does have a lot of behavioral issues and it is a challenge for the patient to manage some of the issues.  However, she feels a reward for being able to provide a stable home and follow the wishes of her friend after her friend passed away from cancer.  10/29/21: The patient reports that she has continued to struggle with her chronic pain symptoms but reports that things have been stable as far as overall psychosocial issues and things are going well with her adopted daughter.  The patient reports that there have been ongoing issues with depression and anxiety.  Today we worked on coping and adjustment issues going forward and strategies to manage the symptoms.  12/26/2021: The patient returns today after 21-month break.  The patient continues to struggle with chronic pain symptoms.  The patient has not returned to see pain management group recently and [facility].  The patient reports that this has helped some but she continues with her significant pain.  The patient has been approved for ketamine  therapies through [facility] but after 1 trial they felt she was not a good candidate although the patient wanted to continue with these efforts.  I have referred the patient to Dr. Lorilee here in our office for review  about the appropriateness of sublingual ketamine  treatment as the patient has verbalized willingness to continue with therapy and this strategy but a different type of very small doses delivered sublingually.  The patient is hoping that this will also help with her depression and other symptoms that go along with her pain.  I will sit down with Dr. Lorilee and reviewed this prior to the patient's visit that is scheduled on 01/14/2022.  04/29/2022: The patient returns and reports that she has really been struggling with chronic pain lately and has a particularly bad week so far with regard to pain and fatigue.  Patient saw neurology regarding her memory difficulties and impressions from that were consistent with my impression is that her memory difficulties are likely resulting from her chronic pain, depression and anxiety stress and other functional issues rather than a degenerative neurological condition.  She was assessed for possible multiple sclerosis and thus far all examinations around that possibility including ANA test and MRI were not indicative of MS.  Patient continues to have a lot of stress and is exacerbated by her complex regional pain symptoms etc.  Patient reports that she has not started back on the small sublingual ketamine  doses but could not remember exactly why this was stopped.  She thinks it was around the time when she had significant illness with COVID.  The patient reports that Dr. Lorilee had wanted her to try a different medicine but does not remember what that medicine was.  I will address this issue with Dr. Lorilee.  06/25/2022: The patient comes in today reporting that her recent injections Dr. Lorilee provided were quite helpful and she has had less pain in her shoulder and neck region.  The patient reports that she hopes for it to last for a month or more.  Patient reports that some of the stressors that have been happening at home have significantly reduced.  She and her husband  continue to struggle with her  relationship but they are trying to work on aspects of their relationship and at this point she is focusing on trying to see if they can manage their relationship rather than separate and she has noted that he has been making efforts as well.  The relationship between her older sons and her husband continues to be strained but her husband is making efforts to try to address some of the conflicts.  This is developed some complications with her son and his wife expected to move into their house for period of time while they save money to buy their own house.  She is apprehensive about how that will play out.  12/15/2022: The patient comes in reporting that there have been a lot of psychosocial stressors with her and her husband as they are now separated and the patient's husband living outside of the house.  One of the big major issues had to do with her oldest son and some of the challenges that the son poses particularly for her husband.  This son is now planning to move in with his girlfriend into her house which is also causing a great deal of stress for the patient.  We addressed this issue a great deal today and continues to be problematic for her.  The patient continues with her significant pain symptoms particularly shoulder issues are playing a big impact on her overall pain now.  Patient is being considered for shoulder surgery but she is concerned how the potential surgery could impact her regional pain syndrome.  The patient is also being considered for new trial of the spinal cord stimulator.  05/05/2023: The patient reports that there continues to be some psychosocial stressors going on.  She and her husband were back to living in the same house again until very recently when a disagreement developed and he moved back out.  There have been some improvements overall in psychosocial stressors.  The patient notes that she has been having increasing problems with somnolence  etc.  The issue of obstructive sleep apnea has been raised again and the patient is planning on having annual workup.  The patient had been previously diagnosed with obstructive sleep apnea but did not tolerate the mask but this was years ago.  I stressed again the need for this to be treated if it is present as it can be playing a role in exacerbating her pain, increasing depression and anxiety symptoms and cognitive difficulties.  The patient reported that she will follow up on getting it scheduled for a sleep study.  We talked about the need for acclimating to a CPAP machine and what that entails and how to work towards success if the patient's previous diagnosis is confirmed again.  11/04/2023:   Reports significant pain and swelling in the foot and leg. The swelling is noted to be worse when the leg is in a dependent position for extended periods. Emergency room visits were conducted to investigate, but no specific findings like blood flow issues were identified at that time. Orthopedics has recommended a vascular study to assess venous return. Describes color changes in the toes, progressing from pink to purple and blue, particularly when sitting for a while, which is suggestive of venous stasis and deoxygenation.  Reports bilateral leg swelling, which began in one leg approximately two months ago with severe foot pain, initially suspected to be plantar fasciitis exacerbating CRPS. The swelling and pain then shifted to the other leg. Currently, both legs are affected, with one being worse  than the other. Experiences syncopal or pre-syncopal events, feeling like passing out, nausea, and dizziness, particularly when changing positions, such as standing up or lying down. These symptoms raise concerns about dysregulation of blood pressure and blood flow.  Has been managing symptoms by elevating the legs in a recliner with pillows. Reports difficulty finding footwear that fits due to the swelling, requiring  the purchase of wider men's shoes.  Interventions Strategy:  Cognitive/behavioral therapeutic interventions along with coping skills and strategies around chronic pain, sleep disturbance and depression/anxiety.  Treatment Interventions:   Provided psychoeducation regarding the potential vascular etiology of the leg swelling, pain, and discoloration. Explained the function of venous check valves and how their potential failure could lead to the presenting symptoms, particularly in the context of gravity. Used the analogy of a sump pump to illustrate the mechanism of blood flow and venous return. Differentiated these symptoms from CRPS, noting that swelling and discoloration are not typical features of CRPS, although the pain could potentially exacerbate it. Discussed the difference between cardiologists and vascular surgeons (interventional cardiologists). Reinforced the importance of keeping both appointments. Advised on practical management strategies, specifically elevating the feet above the heart level regularly to use gravity to aid venous return and protect the nerves from potential damage (neuropathy) due to poor circulation and fluid buildup. Also advised against prolonged inactivity, as it can lead to deconditioning of the cardiovascular system. Recommended obtaining the COVID-19 booster due to current seasonal spikes and its specific match to circulating variants, noting the reduced risk of severe illness and long COVID.  Participation Level:   Active  Participation Quality:  Appropriate and Attentive      Behavioral Observation:  Well Groomed, Alert, and Appropriate.   Current Psychosocial Factors:  Reports that symptoms, particularly pain and swelling, have made it hard to function and perform daily activities. Expresses frustration with difficulty finding shoes that fit due to edema.   Content of Session:   Reviewed presenting symptoms of bilateral leg pain, swelling, and discoloration.  Discussed the potential vascular nature of these symptoms and distinguished them from CRPS. Provided education on cardiovascular physiology, including venous return, check valves, and postural hypotension. Addressed the importance of upcoming appointments with both cardiology and vascular surgery. Discussed practical self-management techniques.   Current Status:   Engaged well with the educational components of the session. Appeared to understand the rationale for the recommended medical follow-up and self-care strategies.  Target Goals:                                     Focus on managing symptoms of suspected vascular issues while awaiting specialist appointments. Has an appointment with a cardiologist on 11/18/2023 and a vascular study scheduled for 12/02/2023. Goal is to adhere to medical recommendations and self-care strategies to prevent complications like neuropathy while awaiting diagnosis and treatment.  Goals Last Reviewed:                       11/03/2023  Goals Addressed Today:                 Addressed presenting symptoms of leg pain, swelling, and discoloration. Differentiated these from CRPS. Provided psychoeducation on potential vascular causes and the importance of medical follow-up with cardiology and vascular surgery. Discussed practical, non-pharmacological interventions like leg elevation.  Patient Progress:   The patient reports that her overall sleep has improved  from initial status but continues to be problematic.  The patient reports that her vein plays a big role in her difficulty sleeping.  Depression anxiety continue to be quite problematic for the patient.  Impression/Diagnosis:   Sally [name] is a 48 year old female referred by [facility] medical for therapeutic interventions due to chronic pain symptoms.  The patient reports that she has great difficulty standing, walking for long times, elbow pain, migraines, hip pain, shoulder and neck pain.  The patient fell at work and  fractured both her elbows and also had knee replacement surgery.  The patient has been taking maintenance doses of opioid pain medications for some time.  The patient reports that she has a great deal of difficulty functioning and has very poor sleep and wakes up numerous times each night.  The patient also has dealt with depression and anxiety for many years and has been hospitalized for depression anxiety years ago.  She has been receiving counseling for anxiety and depression and takes Trintellix.   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   The patient describes chronic issues of depression anxiety but the development of severe chronic pain  After a fall at work in 2015 where she fractured both of her elbows, injured her knee and developed fibromyalgia/regional pain syndrome.  The patient reports that she has ongoing difficulties walking distances or standing.  She describes elbow pain, trouble lifting things and trouble writing for any period of time.  The patient describes significant sleep disturbance and is unable to fall asleep at night.  She reports that she is always tired.  The patient describes a normal appetite.  The patient describes memory issues related to trouble remembering things and concentration issues.  The patient reports that she is significantly less active with her children and husband and that her husband has to help more due to her significant pain.  We have continue to work on issues related to her pain and sleep status along with issues of depression and anxiety.   Diagnosis: Complex regional pain syndrome, fibromyalgia, anxiety and depression, neck, shoulder and  back pain.

## 2023-11-06 ENCOUNTER — Other Ambulatory Visit: Payer: Self-pay

## 2023-11-06 DIAGNOSIS — I739 Peripheral vascular disease, unspecified: Secondary | ICD-10-CM

## 2023-11-17 NOTE — Progress Notes (Unsigned)
 Cardiology Office Note:    Date:  11/18/2023   ID:  Sally [name], DOB January 25, 1975, MRN [id number]  PCP:  Lenon Nell SAILOR, FNP  Cardiologist:  None  Electrophysiologist:  None   Referring MD: Leron Millman, NP   Chief Complaint  Patient presents with   Palpitations    History of Present Illness:    Sally [name] is a 48 y.o. female with a hx of fibromyalgia, hypothyroidism, tobacco use who is referred by Millman Leron, NP for evaluation of palpitations.  She was having palpitations about once per week, lasts for a few minutes.  Reports random episodes of lightheadedness.  Reports she has had near syncopal episodes.  States that feels lightheaded daily, typically last for about 1 to 2 minutes.  Also reports having leg pain and edema.  Denies any exertional chest pain but does report has shortness of breath.  She smoked 1 pack/day x 30 years.  Family history includes paternal grandmother died of MI in 30s.  Past Medical History:  Diagnosis Date   Acute appendicitis 09/09/2012   Anxiety    Back pain    Occ. issues wih back pain   Back pain 09/09/2012   It is intermittent now,  previously source of fibromyalgia dx.     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)    CRPS (complex regional pain syndrome), lower limb 2018   Depression    Edema of both lower extremities    Fibromyalgia    GERD (gastroesophageal reflux disease)    Headache    Hypothyroid 09/09/2012   Hypothyroidism    Joint pain    Osteoarthritis    Palpitation    only when having anxiety   Precancerous changes of the cervix    Sleep apnea    pre lap banding-never used cpap or mask   SOB (shortness of breath)    Swallowing difficulty    Vitamin D  deficiency     Past Surgical History:  Procedure Laterality Date   ANTERIOR CERVICAL DECOMP/DISCECTOMY FUSION N/A 10/27/2018   Procedure: ANTERIOR CERVICAL DECOMPRESSION/DISCECTOMY CERVICAL FIVE-SIX,  CERVICAL SIX-SEVEN;  Surgeon: Burnetta Aures, MD;  Location: [facility];  Service: Orthopedics;  Laterality: N/A;   BILATERAL SALPINGECTOMY Bilateral 11/05/2015   Procedure: BILATERAL SALPINGECTOMY;  Surgeon: Ronal GORMAN Pinal, MD;  Location: [facility];  Service: Gynecology;  Laterality: Bilateral;   BRAIN SURGERY  1992   remove blood clot after a fall   CESAREAN SECTION  1997, 2013   cyst on ovary     CYSTOSCOPY N/A 11/05/2015   Procedure: CYSTOSCOPY;  Surgeon: Ronal GORMAN Pinal, MD;  Location: [facility];  Service: Gynecology;  Laterality: N/A;   GASTRIC BANDING PORT REVISION  01/20/2012   Procedure: GASTRIC BANDING PORT REVISION;  Surgeon: Donnice KATHEE Lunger, MD;  Location: [facility];  Service: General;  Laterality: N/A;   KNEE ARTHROSCOPY Left    KNEE ARTHROSCOPY WITH DRILLING/MICROFRACTURE Right 12/06/2014   Procedure: KNEE ARTHROSCOPY WITH DRILLING/MICROFRACTURE, CHONDROPLASTY, EXCISION OF PLICA;  Surgeon: Norleen Gavel, MD;  Location: [facility];  Service: Orthopedics;  Laterality: Right;   KNEE SURGERY Right 06/26/2016   [facility]   LAPAROSCOPIC APPENDECTOMY N/A 09/09/2012   Procedure: APPENDECTOMY LAPAROSCOPIC;  Surgeon: Deward GORMAN Curvin DOUGLAS, MD;  Location: [facility] OR;  Service: General;  Laterality: N/A;   LAPAROSCOPIC GASTRIC BANDING  1997   LAPAROSCOPIC HYSTERECTOMY N/A 11/05/2015   Procedure:  HYSTERECTOMY TOTAL LAPAROSCOPIC;  Surgeon: Ronal GORMAN Pinal, MD;  Location: [facility];  Service: Gynecology;  Laterality: N/A;   LAPAROSCOPY  08/19/2010   Procedure: LAPAROSCOPY OPERATIVE;  Surgeon: CHRISTELLA Elvie Pinal;  Location: [facility];  Service: Gynecology;  Laterality: N/A;  with Biopsy of uterine serosa   TUBAL LIGATION  08/2011    Current Medications: Current Meds  Medication Sig   albuterol  (VENTOLIN  HFA) 108 (90 Base) MCG/ACT inhaler PLEASE SEE ATTACHED FOR DETAILED DIRECTIONS   ALPRAZolam (XANAX) 0.25 MG tablet Take 0.25 mg by mouth daily.   Aspirin-Acetaminophen -Caffeine (EXCEDRIN MIGRAINE  PO) Take by mouth. prn   BELBUCA 300 MCG FILM Take by mouth daily.   buPROPion (WELLBUTRIN SR) 150 MG 12 hr tablet Take 150 mg by mouth daily.   hydrochlorothiazide  (HYDRODIURIL ) 50 MG tablet Take by mouth as needed.   hydroxypropyl methylcellulose / hypromellose (ISOPTO TEARS / GONIOVISC) 2.5 % ophthalmic solution Place 1 drop into both eyes 3 (three) times daily as needed for dry eyes.   ketorolac  (TORADOL ) 10 MG tablet Take 10 mg by mouth as needed.   levothyroxine  (SYNTHROID ) 200 MCG tablet Take 200 mcg by mouth daily.   liothyronine  (CYTOMEL ) 5 MCG tablet Take 3 tablets (15 mcg total) by mouth daily. Take 2 tablets at breakfast and take one tablet at lunch (Patient taking differently: Take 5-10 mcg by mouth See admin instructions. Take 10 mcg by mouth in the morning and 5 mcg in the evening)   loratadine  (CLARITIN ) 10 MG tablet Take 10 mg by mouth daily as needed.   methocarbamol  (ROBAXIN ) 500 MG tablet Take 500 mg by mouth every 8 (eight) hours as needed for muscle spasms.   Oxycodone  HCl 10 MG TABS SMARTSIG:1 Tablet(s) By Mouth 6 Times Daily PRN   pantoprazole  (PROTONIX ) 40 MG tablet Take 40 mg by mouth daily.   polyethylene glycol (MIRALAX  / GLYCOLAX ) 17 g packet Take 17 g by mouth daily.   Sennosides (SENOKOT EXTRA STRENGTH) 17.2 MG TABS Take 17.2 mg by mouth daily.   venlafaxine  XR (EFFEXOR -XR) 75 MG 24 hr capsule Take 75 mg by mouth daily with breakfast.   Vitamin D , Ergocalciferol , (DRISDOL ) 1.25 MG (50000 UNIT) CAPS capsule TAKE ONE CAPSULE EVERY 7 DAYS     Allergies:   Oxycodone -acetaminophen , Codeine, Duloxetine , Paroxetine hcl, Duloxetine  hcl, and Percocet [oxycodone -acetaminophen ]   Social History   Socioeconomic History   Marital status: Married    Spouse name: Sally [name]   Number of children: 2   Years of education: Not on file   Highest education level: Some college, no degree  Occupational History    Comment: disabled   Occupation: stay at home  Tobacco Use   Smoking  status: Every Day    Current packs/day: 1.00    Average packs/day: 1 pack/day for 25.8 years (25.8 ttl pk-yrs)    Types: Cigarettes    Start date: 2000   Smokeless tobacco: Never  Vaping Use   Vaping status: Never Used  Substance and Sexual Activity   Alcohol use: Not Currently   Drug use: No   Sexual activity: Yes    Partners: Male    Birth control/protection: Surgical    Comment: TLH  Other Topics Concern   Not on file  Social History Narrative   Lives with husband, son   Caffeine - coffee 4 c daily   Social Drivers of Corporate Investment Banker Strain: Not on file  Food Insecurity: No Food Insecurity (09/22/2023)   Hunger Vital Sign  Worried About Programme Researcher, Broadcasting/film/video in the Last Year: Never true    Ran Out of Food in the Last Year: Never true  Transportation Needs: No Transportation Needs (09/22/2023)   PRAPARE - Administrator, Civil Service (Medical): No    Lack of Transportation (Non-Medical): No  Physical Activity: Not on file  Stress: Not on file  Social Connections: Unknown (06/02/2021)   [facility]   Social Network    Social Network: Not on file     Family History: The patient's family history includes Anxiety disorder in her mother; Autoimmune disease in her sister; Depression in her mother; Diabetes in her paternal grandmother; Hypertension in her maternal grandfather and mother; Obesity in her mother. There is no history of Anesthesia problems.  ROS:   Please see the history of present illness.     All other systems reviewed and are negative.  EKGs/Labs/Other Studies Reviewed:    The following studies were reviewed today:   EKG:   11/18/2023: Normal sinus rhythm, rate 77, short PR interval  Recent Labs: 01/24/2023: Magnesium 2.0 09/22/2023: ALT 23 10/26/2023: BUN 13; Creatinine, Ser 0.76; Hemoglobin 13.6; Platelets 270; Potassium 4.5; Sodium 140  Recent Lipid Panel    Component Value Date/Time   CHOL 151 05/06/2021 1015    TRIG 75 05/06/2021 1015   HDL 44 05/06/2021 1015   CHOLHDL 5 09/03/2016 0942   VLDL 18.8 09/03/2016 0942   LDLCALC 92 05/06/2021 1015    Physical Exam:    VS:  BP 122/86   Pulse 76   Ht 5' 8 (1.727 m)   Wt 281 lb 3.2 oz (127.6 kg)   LMP 10/26/2015 (Exact Date)   SpO2 94%   BMI 42.76 kg/m     Wt Readings from Last 3 Encounters:  11/18/23 281 lb 3.2 oz (127.6 kg)  10/26/23 290 lb (131.5 kg)  10/09/23 285 lb (129.3 kg)     GEN:  Well nourished, well developed in no acute distress HEENT: Normal NECK: No JVD; No carotid bruits LYMPHATICS: No lymphadenopathy CARDIAC: RRR, no murmurs, rubs, gallops RESPIRATORY:  Clear to auscultation without rales, wheezing or rhonchi  ABDOMEN: Soft, non-tender, non-distended MUSCULOSKELETAL:  Trace edema; No deformity  SKIN: Warm and dry NEUROLOGIC:  Alert and oriented x 3 PSYCHIATRIC:  Normal affect   ASSESSMENT:    1. Palpitations   2. Near syncope   3. DOE (dyspnea on exertion)   4. Nonspecific abnormal electrocardiogram (ECG) (EKG)   5. 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 Tobacco use    PLAN:    Palpitations/near syncope: EKG with short PR interval, possible delta wave as does appear slurred upstroke in aVL. 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  Will evaluate with Zio patch x 2 weeks.  DOE: Check echocardiogram to rule out structural heart disease  OSA: started on CPAP in August, has been having some issues with her mask, has follow-up scheduled with sleep medicine  Tobacco use: Counseled on the risk of tobacco use and cessation strongly recommended  RTC in 3 months  Medication Adjustments/Labs and Tests Ordered: Current medicines are reviewed at length with the patient today.  Concerns regarding medicines are outlined above.  Orders Placed This Encounter  Procedures   Ambulatory referral to Cardiac Electrophysiology   LONG TERM MONITOR (3-14 DAYS)   EKG 12-Lead   ECHOCARDIOGRAM COMPLETE   No  orders of the defined types were placed in this encounter.   Patient  Instructions  Medication Instructions:  Your physician recommends that you continue on your current medications as directed. Please refer to the Current Medication list given to you today.  *If you need a refill on your cardiac medications before your next appointment, please call your pharmacy*  Lab Work: none If you have labs (blood work) drawn today and your tests are completely normal, you will receive your results only by: MyChart Message (if you have MyChart) OR A paper copy in the mail If you have any lab test that is abnormal or we need to change your treatment, we will call you to review the results.  Testing/Procedures: Echo  Your physician has requested that you have an echocardiogram. Echocardiography is a painless test that uses sound waves to create images of your heart. It provides your doctor with information about the size and shape of your heart and how well your heart's chambers and valves are working. This procedure takes approximately one hour. There are no restrictions for this procedure. Please do NOT wear cologne, perfume, aftershave, or lotions (deodorant is allowed). Please arrive 15 minutes prior to your appointment time.  Please note: We ask at that you not bring children with you during ultrasound (echo/ vascular) testing. Due to room size and safety concerns, children are not allowed in the ultrasound rooms during exams. Our front office staff cannot provide observation of children in our lobby area while testing is being conducted. An adult accompanying a patient to their appointment will only be allowed in the ultrasound room at the discretion of the ultrasound technician under special circumstances. We apologize for any inconvenience.   Follow-Up: At [facility], you and your health needs are our priority.  As part of our continuing mission to provide you with exceptional heart  care, our providers are all part of one team.  This team includes your primary Cardiologist (physician) and Advanced Practice Providers or APPs (Physician Assistants and Nurse Practitioners) who all work together to provide you with the care you need, when you need it.  Your next appointment:   3 months  Provider:   Dr. Kate  We recommend signing up for the patient portal called MyChart.  Sign up information is provided on this After Visit Summary.  MyChart is used to connect with patients for Virtual Visits (Telemedicine).  Patients are able to view lab/test results, encounter notes, upcoming appointments, etc.  Non-urgent messages can be sent to your provider as well.   To learn more about what you can do with MyChart, go to [email or web address].   Other Instructions Referral to Electrophysiology   Zio  ZIO XT- Long Term Monitor Instructions  Your physician has requested you wear a ZIO patch monitor for 14 days.  This is a single patch monitor. [employer] supplies one patch monitor per enrollment. Additional stickers are not available. Please do not apply patch if you will be having a Nuclear Stress Test,  Echocardiogram, Cardiac CT, MRI, or Chest Xray during the period you would be wearing the  monitor. The patch cannot be worn during these tests. You cannot remove and re-apply the  ZIO XT patch monitor.  Your ZIO patch monitor will be mailed 3 day USPS to your address on file. It may take 3-5 days  to receive your monitor after you have been enrolled.  Once you have received your monitor, please review the enclosed instructions. Your monitor  has already been registered assigning a specific monitor serial # to you.  Billing and  Patient Assistance Program Information  We have supplied [employer] with any of your insurance information on file for billing purposes. [employer] offers a sliding scale Patient Assistance Program for patients that do not have  insurance, or whose  insurance does not completely cover the cost of the ZIO monitor.  You must apply for the Patient Assistance Program to qualify for this discounted rate.  To apply, please call [employer] at [phone], select option 4, select option 2, ask to apply for  Patient Assistance Program. Meredeth will ask your household income, and how many people  are in your household. They will quote your out-of-pocket cost based on that information.  [employer] will also be able to set up a 28-month, interest-free payment plan if needed.  Applying the monitor   Shave hair from upper left chest.  Hold abrader disc by orange tab. Rub abrader in 40 strokes over the upper left chest as  indicated in your monitor instructions.  Clean area with 4 enclosed alcohol pads. Let dry.  Apply patch as indicated in monitor instructions. Patch will be placed under collarbone on left  side of chest with arrow pointing upward.  Rub patch adhesive wings for 2 minutes. Remove white label marked 1. Remove the white  label marked 2. Rub patch adhesive wings for 2 additional minutes.  While looking in a mirror, press and release button in center of patch. A small green light will  flash 3-4 times. This will be your only indicator that the monitor has been turned on.  Do not shower for the first 24 hours. You may shower after the first 24 hours.  Press the button if you feel a symptom. You will hear a small click. Record Date, Time and  Symptom in the Patient Logbook.  When you are ready to remove the patch, follow instructions on the last 2 pages of Patient  Logbook. Stick patch monitor onto the last page of Patient Logbook.  Place Patient Logbook in the blue and white box. Use locking tab on box and tape box closed  securely. The blue and white box has prepaid postage on it. Please place it in the mailbox as  soon as possible. Your physician should have your test results approximately 7 days after the  monitor has been mailed back  to [facility].  Call [facility] Customer Care at [phone] if you have questions regarding  your ZIO XT patch monitor. Call them immediately if you see an orange light blinking on your  monitor.  If your monitor falls off in less than 4 days, contact our Monitor department at [phone].  If your monitor becomes loose or falls off after 4 days call [employer] at [phone] for  suggestions on securing your monitor        ,i   Signed, Lonni LITTIE Nanas, MD  11/18/2023 11:01 AM    [facility]

## 2023-11-18 ENCOUNTER — Ambulatory Visit: Attending: Cardiology | Admitting: Cardiology

## 2023-11-18 ENCOUNTER — Ambulatory Visit: Attending: Cardiology

## 2023-11-18 VITALS — BP 122/86 | HR 76 | Ht 68.0 in | Wt 281.2 lb

## 2023-11-18 DIAGNOSIS — R55 Syncope and collapse: Secondary | ICD-10-CM

## 2023-11-18 DIAGNOSIS — I456 Pre-excitation syndrome: Secondary | ICD-10-CM

## 2023-11-18 DIAGNOSIS — R0609 Other forms of dyspnea: Secondary | ICD-10-CM | POA: Diagnosis not present

## 2023-11-18 DIAGNOSIS — R002 Palpitations: Secondary | ICD-10-CM

## 2023-11-18 DIAGNOSIS — R9431 Abnormal electrocardiogram [ECG] [EKG]: Secondary | ICD-10-CM

## 2023-11-18 DIAGNOSIS — Z720 Tobacco use: Secondary | ICD-10-CM

## 2023-11-18 NOTE — Patient Instructions (Addendum)
 Medication Instructions:  Your physician recommends that you continue on your current medications as directed. Please refer to the Current Medication list given to you today.  *If you need a refill on your cardiac medications before your next appointment, please call your pharmacy*  Lab Work: none If you have labs (blood work) drawn today and your tests are completely normal, you will receive your results only by: MyChart Message (if you have MyChart) OR A paper copy in the mail If you have any lab test that is abnormal or we need to change your treatment, we will call you to review the results.  Testing/Procedures: Echo  Your physician has requested that you have an echocardiogram. Echocardiography is a painless test that uses sound waves to create images of your heart. It provides your doctor with information about the size and shape of your heart and how well your heart's chambers and valves are working. This procedure takes approximately one hour. There are no restrictions for this procedure. Please do NOT wear cologne, perfume, aftershave, or lotions (deodorant is allowed). Please arrive 15 minutes prior to your appointment time.  Please note: We ask at that you not bring children with you during ultrasound (echo/ vascular) testing. Due to room size and safety concerns, children are not allowed in the ultrasound rooms during exams. Our front office staff cannot provide observation of children in our lobby area while testing is being conducted. An adult accompanying a patient to their appointment will only be allowed in the ultrasound room at the discretion of the ultrasound technician under special circumstances. We apologize for any inconvenience.   Follow-Up: At [facility], you and your health needs are our priority.  As part of our continuing mission to provide you with exceptional heart care, our providers are all part of one team.  This team includes your primary  Cardiologist (physician) and Advanced Practice Providers or APPs (Physician Assistants and Nurse Practitioners) who all work together to provide you with the care you need, when you need it.  Your next appointment:   3 months  Provider:   Dr. Kate  We recommend signing up for the patient portal called MyChart.  Sign up information is provided on this After Visit Summary.  MyChart is used to connect with patients for Virtual Visits (Telemedicine).  Patients are able to view lab/test results, encounter notes, upcoming appointments, etc.  Non-urgent messages can be sent to your provider as well.   To learn more about what you can do with MyChart, go to [email or web address].   Other Instructions Referral to Electrophysiology   Zio  ZIO XT- Long Term Monitor Instructions  Your physician has requested you wear a ZIO patch monitor for 14 days.  This is a single patch monitor. [employer] supplies one patch monitor per enrollment. Additional stickers are not available. Please do not apply patch if you will be having a Nuclear Stress Test,  Echocardiogram, Cardiac CT, MRI, or Chest Xray during the period you would be wearing the  monitor. The patch cannot be worn during these tests. You cannot remove and re-apply the  ZIO XT patch monitor.  Your ZIO patch monitor will be mailed 3 day USPS to your address on file. It may take 3-5 days  to receive your monitor after you have been enrolled.  Once you have received your monitor, please review the enclosed instructions. Your monitor  has already been registered assigning a specific monitor serial # to you.  Billing and Patient Assistance  Program Information  We have supplied [employer] with any of your insurance information on file for billing purposes. [employer] offers a sliding scale Patient Assistance Program for patients that do not have  insurance, or whose insurance does not completely cover the cost of the ZIO monitor.  You must apply for  the Patient Assistance Program to qualify for this discounted rate.  To apply, please call [employer] at [phone], select option 4, select option 2, ask to apply for  Patient Assistance Program. Meredeth will ask your household income, and how many people  are in your household. They will quote your out-of-pocket cost based on that information.  [employer] will also be able to set up a 51-month, interest-free payment plan if needed.  Applying the monitor   Shave hair from upper left chest.  Hold abrader disc by orange tab. Rub abrader in 40 strokes over the upper left chest as  indicated in your monitor instructions.  Clean area with 4 enclosed alcohol pads. Let dry.  Apply patch as indicated in monitor instructions. Patch will be placed under collarbone on left  side of chest with arrow pointing upward.  Rub patch adhesive wings for 2 minutes. Remove white label marked 1. Remove the white  label marked 2. Rub patch adhesive wings for 2 additional minutes.  While looking in a mirror, press and release button in center of patch. A small green light will  flash 3-4 times. This will be your only indicator that the monitor has been turned on.  Do not shower for the first 24 hours. You may shower after the first 24 hours.  Press the button if you feel a symptom. You will hear a small click. Record Date, Time and  Symptom in the Patient Logbook.  When you are ready to remove the patch, follow instructions on the last 2 pages of Patient  Logbook. Stick patch monitor onto the last page of Patient Logbook.  Place Patient Logbook in the blue and white box. Use locking tab on box and tape box closed  securely. The blue and white box has prepaid postage on it. Please place it in the mailbox as  soon as possible. Your physician should have your test results approximately 7 days after the  monitor has been mailed back to [facility]  Call [facility] Customer Care at [phone] if you have  questions regarding  your ZIO XT patch monitor. Call them immediately if you see an orange light blinking on your  monitor.  If your monitor falls off in less than 4 days, contact our Monitor department at [phone].  If your monitor becomes loose or falls off after 4 days call [employer] at [phone] for  suggestions on securing your monitor        ,i

## 2023-11-18 NOTE — Progress Notes (Unsigned)
 Enrolled patient for a 14 day Zio XT  monitor to be mailed to patients home

## 2023-11-30 ENCOUNTER — Encounter: Payer: Self-pay | Admitting: Cardiology

## 2023-11-30 NOTE — Telephone Encounter (Signed)
 Would recommend holding off on phentermine or stimulant for now until evaluated by EP, has upcoming appointment

## 2023-12-02 ENCOUNTER — Ambulatory Visit (HOSPITAL_COMMUNITY)
Admission: RE | Admit: 2023-12-02 | Discharge: 2023-12-02 | Disposition: A | Discharge status: Home / Self Care | Source: Ambulatory Visit | Attending: Vascular Surgery | Admitting: Vascular Surgery

## 2023-12-02 ENCOUNTER — Ambulatory Visit (INDEPENDENT_AMBULATORY_CARE_PROVIDER_SITE_OTHER): Admitting: Vascular Surgery

## 2023-12-02 ENCOUNTER — Encounter: Payer: Self-pay | Admitting: Vascular Surgery

## 2023-12-02 VITALS — BP 133/84 | HR 79 | Temp 97.9°F | Ht 68.0 in | Wt 283.0 lb

## 2023-12-02 DIAGNOSIS — M7989 Other specified soft tissue disorders: Secondary | ICD-10-CM | POA: Diagnosis not present

## 2023-12-02 DIAGNOSIS — I739 Peripheral vascular disease, unspecified: Secondary | ICD-10-CM | POA: Diagnosis not present

## 2023-12-02 LAB — VAS US ABI WITH/WO TBI
Left ABI: 1.25
Right ABI: 1.23

## 2023-12-02 NOTE — Progress Notes (Signed)
 Patient ID: Sally [name], female   DOB: 05-22-1975, 48 y.o.   MRN: [id number]  Reason for Consult: New Patient (Initial Visit)   Referred by Lenon Nell SAILOR, FNP  Subjective:     HPI:  Sally [name] is a 48 y.o. female without previous vascular history.  She is currently being evaluated with a Holter monitor.  She has bilateral lower extremity swelling and pain but without tissue loss or ulceration and denies frank claudication.  She does occasionally wear compression stockings.  She has a history of right knee replacement and left knee arthroscopic surgery.  She denies any personal or family history of DVT.  Past Medical History:  Diagnosis Date   Acute appendicitis 09/09/2012   Anxiety    Back pain    Occ. issues wih back pain   Back pain 09/09/2012   It is intermittent now,  previously source of fibromyalgia dx.     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)    CRPS (complex regional pain syndrome), lower limb 2018   Depression    Edema of both lower extremities    Fibromyalgia    GERD (gastroesophageal reflux disease)    Headache    Hypothyroid 09/09/2012   Hypothyroidism    Joint pain    Osteoarthritis    Palpitation    only when having anxiety   Precancerous changes of the cervix    Sleep apnea    pre lap banding-never used cpap or mask   SOB (shortness of breath)    Swallowing difficulty    Vitamin D  deficiency    Family History  Problem Relation Age of Onset   Hypertension Mother    Depression Mother    Anxiety disorder Mother    Obesity Mother    Autoimmune disease Sister    Hypertension Maternal Grandfather    Diabetes Paternal Grandmother    Anesthesia problems Neg Hx    Past Surgical History:  Procedure Laterality Date   ANTERIOR CERVICAL DECOMP/DISCECTOMY FUSION N/A 10/27/2018   Procedure: ANTERIOR CERVICAL DECOMPRESSION/DISCECTOMY CERVICAL FIVE-SIX, CERVICAL SIX-SEVEN;  Surgeon:  Burnetta Aures, MD;  Location: [facility];  Service: Orthopedics;  Laterality: N/A;   BILATERAL SALPINGECTOMY Bilateral 11/05/2015   Procedure: BILATERAL SALPINGECTOMY;  Surgeon: Ronal GORMAN Pinal, MD;  Location: [facility];  Service: Gynecology;  Laterality: Bilateral;   BRAIN SURGERY  1992   remove blood clot after a fall   CESAREAN SECTION  1997, 2013   cyst on ovary     CYSTOSCOPY N/A 11/05/2015   Procedure: CYSTOSCOPY;  Surgeon: Ronal GORMAN Pinal, MD;  Location: [facility];  Service: Gynecology;  Laterality: N/A;   GASTRIC BANDING PORT REVISION  01/20/2012   Procedure: GASTRIC BANDING PORT REVISION;  Surgeon: Donnice KATHEE Lunger, MD;  Location: [facility];  Service: General;  Laterality: N/A;   KNEE ARTHROSCOPY Left    KNEE ARTHROSCOPY WITH DRILLING/MICROFRACTURE Right 12/06/2014   Procedure: KNEE ARTHROSCOPY WITH DRILLING/MICROFRACTURE, CHONDROPLASTY, EXCISION OF PLICA;  Surgeon: Norleen Gavel, MD;  Location: [facility];  Service: Orthopedics;  Laterality: Right;   KNEE SURGERY Right 06/26/2016   [facility]   LAPAROSCOPIC APPENDECTOMY N/A 09/09/2012   Procedure: APPENDECTOMY LAPAROSCOPIC;  Surgeon: Deward GORMAN Curvin DOUGLAS, MD;  Location: [facility] OR;  Service: General;  Laterality: N/A;   LAPAROSCOPIC GASTRIC BANDING  1997   LAPAROSCOPIC HYSTERECTOMY N/A 11/05/2015   Procedure: HYSTERECTOMY TOTAL LAPAROSCOPIC;  Surgeon: Ronal GORMAN Pinal, MD;  Location: [facility];  Service: Gynecology;  Laterality: N/A;   LAPAROSCOPY  08/19/2010   Procedure: LAPAROSCOPY OPERATIVE;  Surgeon: CHRISTELLA Elvie Pinal;  Location: [facility];  Service: Gynecology;  Laterality: N/A;  with Biopsy of uterine serosa   TUBAL LIGATION  08/2011    Short Social History:  Social History   Tobacco Use   Smoking status: Every Day    Current packs/day: 1.00    Average packs/day: 1 pack/day for 25.9 years (25.9 ttl pk-yrs)    Types: Cigarettes    Start date: 2000   Smokeless tobacco: Never  Substance Use Topics   Alcohol use: Not Currently     Allergies  Allergen Reactions   Oxycodone -Acetaminophen  Other (See Comments) and Itching    Other reaction(s): Other (See Comments)  Plain oxycodone  does not bother her.  Plain oxycodone  does not bother her.  Plain oxycodone  does not bother her.   Codeine Other (See Comments) and Nausea And Vomiting    Puts patient to sleep knocks her out for at least two days, regardless of dosage.  Other Reaction(s): Fatique  Other reaction(s): Fatique, Lethargy (intolerance), Other, Other (See Comments)  Puts patient to sleep knocks her out for at least two days, regardless of dosage.  Puts patient to sleep knocks her out for at least two days, regardless of dosage.   Duloxetine  Nausea And Vomiting and Other (See Comments)    Other Reaction(s): GI Intolerance  Other reaction(s): GI Intolerance, Other (See Comments), Other (See Comments)  Flu like symptoms   Paroxetine Hcl Rash   Duloxetine  Hcl Other (See Comments)    Flu like symptoms   Percocet [Oxycodone -Acetaminophen ] Itching    Current Outpatient Medications  Medication Sig Dispense Refill   albuterol  (VENTOLIN  HFA) 108 (90 Base) MCG/ACT inhaler PLEASE SEE ATTACHED FOR DETAILED DIRECTIONS     ALPRAZolam (XANAX) 0.25 MG tablet Take 0.25 mg by mouth daily.     Aspirin-Acetaminophen -Caffeine (EXCEDRIN MIGRAINE PO) Take by mouth. prn     BELBUCA 300 MCG FILM Take by mouth daily.     buPROPion (WELLBUTRIN SR) 150 MG 12 hr tablet Take 150 mg by mouth daily.     hydrochlorothiazide  (HYDRODIURIL ) 50 MG tablet Take by mouth as needed.     hydroxypropyl methylcellulose / hypromellose (ISOPTO TEARS / GONIOVISC) 2.5 % ophthalmic solution Place 1 drop into both eyes 3 (three) times daily as needed for dry eyes.     ketorolac  (TORADOL ) 10 MG tablet Take 10 mg by mouth as needed.     levothyroxine  (SYNTHROID ) 200 MCG tablet Take 200 mcg by mouth daily.     liothyronine  (CYTOMEL ) 5 MCG tablet Take 3 tablets (15 mcg total) by mouth daily. Take  2 tablets at breakfast and take one tablet at lunch (Patient taking differently: Take 5-10 mcg by mouth See admin instructions. Take 10 mcg by mouth in the morning and 5 mcg in the evening) 270 tablet 1   loratadine  (CLARITIN ) 10 MG tablet Take 10 mg by mouth daily as needed.     methocarbamol  (ROBAXIN ) 500 MG tablet Take 500 mg by mouth every 8 (eight) hours as needed for muscle spasms.     Oxycodone  HCl 10 MG TABS SMARTSIG:1 Tablet(s) By Mouth 6 Times Daily PRN     pantoprazole  (PROTONIX ) 40 MG tablet Take 40 mg by mouth daily.     polyethylene glycol (MIRALAX  / GLYCOLAX ) 17 g packet Take 17 g by mouth daily.  Sennosides (SENOKOT EXTRA STRENGTH) 17.2 MG TABS Take 17.2 mg by mouth daily.     venlafaxine  XR (EFFEXOR -XR) 75 MG 24 hr capsule Take 75 mg by mouth daily with breakfast.     Vitamin D , Ergocalciferol , (DRISDOL ) 1.25 MG (50000 UNIT) CAPS capsule TAKE ONE CAPSULE EVERY 7 DAYS 8 capsule 0   No current facility-administered medications for this visit.    Review of Systems  Constitutional:  Constitutional negative. HENT: HENT negative.  Eyes: Eyes negative.  Cardiovascular: Positive for leg swelling.  GI: Gastrointestinal negative.  Musculoskeletal: Positive for leg pain.  Neurological: Neurological negative. Hematologic: Hematologic/lymphatic negative.  Psychiatric: Psychiatric negative.        Objective:  Objective   Vitals:   12/02/23 1054  BP: 133/84  Pulse: 79  Temp: 97.9 F (36.6 C)  SpO2: 96%  Weight: 283 lb (128.4 kg)  Height: 5' 8 (1.727 m)   Body mass index is 43.03 kg/m.  Physical Exam HENT:     Head: Normocephalic.     Mouth/Throat:     Mouth: Mucous membranes are moist.  Eyes:     Pupils: Pupils are equal, round, and reactive to light.  Abdominal:     General: Abdomen is flat.  Musculoskeletal:     Cervical back: Normal range of motion.     Comments: Trace nonpitting edema bilateral lower extremities Well-healed right knee incision  Skin:     General: Skin is warm.     Capillary Refill: Capillary refill takes less than 2 seconds.  Neurological:     Mental Status: She is alert.  Psychiatric:        Mood and Affect: Mood normal.     Data: ABI Findings:  +---------+------------------+-----+-----------+--------+  Right   Rt Pressure (mmHg)IndexWaveform   Comment   +---------+------------------+-----+-----------+--------+  Brachial 132                    triphasic            +---------+------------------+-----+-----------+--------+  PTA     170               1.23 multiphasic          +---------+------------------+-----+-----------+--------+  DP      142               1.03 triphasic            +---------+------------------+-----+-----------+--------+  Great Toe87                0.63                      +---------+------------------+-----+-----------+--------+   +---------+------------------+-----+---------+-------+  Left    Lt Pressure (mmHg)IndexWaveform Comment  +---------+------------------+-----+---------+-------+  Brachial 138                    triphasic         +---------+------------------+-----+---------+-------+  PTA     172               1.25 triphasic         +---------+------------------+-----+---------+-------+  DP      148               1.07 triphasic         +---------+------------------+-----+---------+-------+  Great Toe91                0.66                   +---------+------------------+-----+---------+-------+   +-------+-----------+-----------+------------+------------+  ABI/TBIToday's ABIToday's TBIPrevious ABIPrevious TBI  +-------+-----------+-----------+------------+------------+  Right 1.23       0.63                                 +-------+-----------+-----------+------------+------------+  Left  1.25       0.66                                 +-------+-----------+-----------+------------+------------+       Summary:  Right: Resting right ankle-brachial index is within normal range.  Right toe pressure is >60 mmHg which suggests adequate perfusion for  healing.    Left: Resting left ankle-brachial index is within normal range.  Left toe pressure is >60 mmHg which suggests adequate perfusion for  healing.    RIGHTCompressibilityPhasicitySpontaneityPropertiesThrombus Aging  +-----+---------------+---------+-----------+----------+--------------+  CFV Full           Yes      Yes                                  +-----+---------------+---------+-----------+----------+--------------+         +---------+---------------+---------+-----------+----------+---------------  ----+  LEFT    CompressibilityPhasicitySpontaneityPropertiesThrombus Aging        +---------+---------------+---------+-----------+----------+---------------  ----+  CFV     Full           Yes      Yes                                        +---------+---------------+---------+-----------+----------+---------------  ----+  SFJ     Full           Yes      Yes                                        +---------+---------------+---------+-----------+----------+---------------  ----+  FV Prox  Full           Yes      Yes                                        +---------+---------------+---------+-----------+----------+---------------  ----+  FV Mid   Full           Yes      Yes                                        +---------+---------------+---------+-----------+----------+---------------  ----+  FV DistalFull           Yes      Yes                                        +---------+---------------+---------+-----------+----------+---------------  ----+  PFV     Full           Yes      Yes                                        +---------+---------------+---------+-----------+----------+---------------  ----+  POP     Full           Yes      Yes                                         +---------+---------------+---------+-----------+----------+---------------  ----+  PTV     Full           Yes      Yes                                        +---------+---------------+---------+-----------+----------+---------------  ----+  PERO    Full           Yes      Yes                  not well  visualized  +---------+---------------+---------+-----------+----------+---------------  ----+  Gastroc Full           Yes      No                                         +---------+---------------+---------+-----------+----------+---------------  ----+  GSV     None           No       No                   Chronic               +---------+---------------+---------+-----------+----------+---------------  ----+  SSV     Full           Yes      Yes                                        +---------+---------------+---------+-----------+----------+---------------  ----+   Small contracted GSV below knee mid to distal calf          Summary:   LEFT:     - 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    - There is no evidence of deep vein thrombosis in the lower extremity.  - There is no evidence of deep vein thrombosis proximal to the inguinal  ligament or in the common femoral vein.         Assessment/Plan:     48 year old female with history of bilateral extremity swelling and pain that appears multifactorial in nature.  Recent negative DVT study of the left lower extremity and normal ABIs bilaterally with unremarkable physical exam.  Possibly has chronic venous insufficiency.  She is currently undergoing cardiac workup if this is completed and no resolution of symptoms we can consider lower extremity reflux testing.  She will call to follow-up with reflux testing if she has persistent symptoms otherwise she can see me on an  as-needed basis.     Penne Lonni Colorado MD [facility]

## 2023-12-09 ENCOUNTER — Ambulatory Visit (HOSPITAL_COMMUNITY)
Admission: RE | Admit: 2023-12-09 | Discharge: 2023-12-09 | Disposition: A | Discharge status: Home / Self Care | Source: Ambulatory Visit | Attending: Cardiology | Admitting: Cardiology

## 2023-12-09 DIAGNOSIS — R9431 Abnormal electrocardiogram [ECG] [EKG]: Secondary | ICD-10-CM | POA: Diagnosis not present

## 2023-12-09 DIAGNOSIS — R002 Palpitations: Secondary | ICD-10-CM | POA: Diagnosis present

## 2023-12-09 LAB — ECHOCARDIOGRAM COMPLETE
AR max vel: 2.69 cm2
AV Area VTI: 2.5 cm2
AV Area mean vel: 2.65 cm2
AV Mean grad: 7 mmHg
AV Peak grad: 12.7 mmHg
Ao pk vel: 1.78 m/s
Area-P 1/2: 4.24 cm2
Calc EF: 59.3 %
S' Lateral: 3.6 cm
Single Plane A2C EF: 63.1 %
Single Plane A4C EF: 55.7 %

## 2023-12-09 MED ORDER — PERFLUTREN LIPID MICROSPHERE
1.0000 mL | INTRAVENOUS | Status: AC | PRN
  Administered 2023-12-09: 2 mL via INTRAVENOUS

## 2023-12-10 ENCOUNTER — Ambulatory Visit: Payer: Self-pay | Admitting: Cardiology

## 2023-12-27 NOTE — Progress Notes (Unsigned)
  Electrophysiology Office Note:    Date:  12/28/2023   ID:  Sally [name], DOB 09/14/1975, MRN [id number]  PCP:  Lenon Nell SAILOR, FNP   [facility] Providers Cardiologist:  None     Referring MD: Kate Lonni CROME*   History of Present Illness:    Sally [name] is a 49 y.o. female with a medical history significant for fibromyalgia and hypothyroidism, referred for evaluation management of palpitations and arrhythmia.      Discussed the use of AI scribe software for clinical note transcription with the patient, who gave verbal consent to proceed.  History of Present Illness  She reports frequent episodes of palpitations with a racing and irregular heartbeat.  She also has lightheadedness and dizziness.   A recent heart monitor showed numerous symptomatic episodes, with about one percent burden of premature atrial complexes.  She has abnormal swelling of the left leg and is concerned it may be related to her heart condition.           Today, she reports that she is at baseline and has no acute complaints  EKGs/Labs/Other Studies Reviewed Today:     Echocardiogram:  TTE November 2025 LVEF 55 to 6%.  Normal structure and function   Monitors:  14 day monitor November 2025-- my interpretation Sinus rhythm, heart rate 45 to 185 bpm, average 90 bpm 2 episodes of SVT, longest of 1.6 seconds consistent with brief atrial runs Overall burden of PACs 1.1% Many symptom episodes reported.  Most show sinus rhythm and sinus tachycardia, occasionally with PACs or PVCs    EKG:   EKG Interpretation Date/Time:  Monday December 28 2023 12:15:41 EST Ventricular Rate:  77 PR Interval:  128 QRS Duration:  102 QT Interval:  362 QTC Calculation: 409 R Axis:   14  Text Interpretation: Normal sinus rhythm Minimal voltage criteria for LVH, may be normal variant ( R in aVL ) Cannot rule out Anterior infarct , age undetermined When compared with ECG of  18-Nov-2023 08:40, No significant change was found Confirmed by Nancey Scotts [phone]) on 12/28/2023 12:20:43 PM     Physical Exam:    VS:  BP 118/84   Pulse 77   Ht 5' 8 (1.727 m)   Wt 285 lb (129.3 kg)   LMP 10/26/2015 (Exact Date)   SpO2 97%   BMI 43.33 kg/m     Wt Readings from Last 3 Encounters:  12/28/23 285 lb (129.3 kg)  12/02/23 283 lb (128.4 kg)  11/18/23 281 lb 3.2 oz (127.6 kg)     GEN: Well nourished, well developed in no acute distress CARDIAC: RRR, no murmurs, rubs, gallops RESPIRATORY:  Normal work of breathing MUSCULOSKELETAL: no edema    ASSESSMENT & PLAN:     Palpitations Occasional atrial ectopy Total PAC burden approximately 1% Will start metoprolol  XL 25 mg I do not think any further intervention needed at this time  Slurred QRS in aVL Low suspicion for WPW  Lightheaded episodes Do not appear to be associated with arrhythmia   She may follow-up in EP clinic if further issues arise or she begins to have sustained episodes of tachycardia, or her palpitations worsen.   Signed, Scotts FORBES Nancey, MD  12/28/2023 12:30 PM    [facility]

## 2023-12-28 ENCOUNTER — Ambulatory Visit: Attending: Cardiovascular Disease | Admitting: Cardiovascular Disease

## 2023-12-28 ENCOUNTER — Encounter: Payer: Self-pay | Admitting: Cardiovascular Disease

## 2023-12-28 VITALS — BP 118/84 | HR 77 | Ht 68.0 in | Wt 285.0 lb

## 2023-12-28 DIAGNOSIS — R002 Palpitations: Secondary | ICD-10-CM

## 2023-12-28 MED ORDER — METOPROLOL SUCCINATE ER 25 MG PO TB24: 25.0000 mg | ORAL_TABLET | Freq: Every day | ORAL | 3 refills | Status: AC

## 2023-12-28 NOTE — Patient Instructions (Signed)
 Medication Instructions:  Your physician has recommended you make the following change in your medication:   ** Begin Metoprolol  Succinate 25mg  - 1 tablet by mouth daily  *If you need a refill on your cardiac medications before your next appointment, please call your pharmacy*  Lab Work: None ordered.  If you have labs (blood work) drawn today and your tests are completely normal, you will receive your results only by: MyChart Message (if you have MyChart) OR A paper copy in the mail If you have any lab test that is abnormal or we need to change your treatment, we will call you to review the results.  Testing/Procedures: None ordered.   Follow-Up: At [facility], you and your health needs are our priority.  As part of our continuing mission to provide you with exceptional heart care, our providers are all part of one team.  This team includes your primary Cardiologist (physician) and Advanced Practice Providers or APPs (Physician Assistants and Nurse Practitioners) who all work together to provide you with the care you need, when you need it.  Your next appointment:   Follow up with Dr Mealor as needed

## 2024-01-01 ENCOUNTER — Ambulatory Visit (HOSPITAL_COMMUNITY)

## 2024-01-05 ENCOUNTER — Encounter: Payer: Self-pay | Admitting: Diagnostic Neuroimaging

## 2024-01-05 ENCOUNTER — Telehealth: Payer: Self-pay | Admitting: Diagnostic Neuroimaging

## 2024-01-05 ENCOUNTER — Ambulatory Visit: Admitting: Diagnostic Neuroimaging

## 2024-01-05 VITALS — BP 138/76 | HR 89 | Ht 68.0 in | Wt 279.6 lb

## 2024-01-05 DIAGNOSIS — R200 Anesthesia of skin: Secondary | ICD-10-CM

## 2024-01-05 DIAGNOSIS — H539 Unspecified visual disturbance: Secondary | ICD-10-CM | POA: Diagnosis not present
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
## 2024-01-05 NOTE — Telephone Encounter (Signed)
 Patient called to reschedule appointment, but decided to keep appointment due to no show fee and will call back if cannot keep appointment.

## 2024-01-05 NOTE — Patient Instructions (Signed)
 INTERMITTENT LEFT FACIAL NUMBNESS / VISION CHANGES (seconds / minutes; since ~Oct 2025) - check MRI brain w/wo

## 2024-01-10 DIAGNOSIS — R002 Palpitations: Secondary | ICD-10-CM | POA: Diagnosis not present

## 2024-01-28 ENCOUNTER — Ambulatory Visit
Admission: RE | Admit: 2024-01-28 | Discharge: 2024-01-28 | Disposition: A | Discharge status: Home / Self Care | Source: Ambulatory Visit | Attending: Diagnostic Neuroimaging | Admitting: Diagnostic Neuroimaging

## 2024-01-28 DIAGNOSIS — H539 Unspecified visual disturbance: Secondary | ICD-10-CM | POA: Diagnosis not present

## 2024-01-28 DIAGNOSIS — R200 Anesthesia of skin: Secondary | ICD-10-CM

## 2024-01-28 MED ORDER — GADOPICLENOL 0.5 MMOL/ML IV SOLN
10.0000 mL | Freq: Once | INTRAVENOUS | Status: AC | PRN
  Administered 2024-01-28: 10 mL via INTRAVENOUS

## 2024-02-04 ENCOUNTER — Ambulatory Visit: Payer: Self-pay | Admitting: Diagnostic Neuroimaging

## 2024-02-19 ENCOUNTER — Ambulatory Visit: Admitting: Cardiology

## 2024-03-01 ENCOUNTER — Encounter: Admitting: Psychology
